# Patient Record
Sex: Male | Born: 1940 | Race: White | Hispanic: No | Smoking: Former smoker
Health system: Southern US, Community
[De-identification: ages and names within clinical notes are randomized; demographics above are authoritative.]

## PROBLEM LIST (undated history)

## (undated) DIAGNOSIS — R0600 Dyspnea, unspecified: Secondary | ICD-10-CM

## (undated) DIAGNOSIS — I350 Nonrheumatic aortic (valve) stenosis: Secondary | ICD-10-CM

## (undated) DIAGNOSIS — I10 Essential (primary) hypertension: Secondary | ICD-10-CM

## (undated) DIAGNOSIS — Z951 Presence of aortocoronary bypass graft: Secondary | ICD-10-CM

## (undated) DIAGNOSIS — D649 Anemia, unspecified: Secondary | ICD-10-CM

## (undated) DIAGNOSIS — I2510 Atherosclerotic heart disease of native coronary artery without angina pectoris: Secondary | ICD-10-CM

## (undated) DIAGNOSIS — E785 Hyperlipidemia, unspecified: Secondary | ICD-10-CM

## (undated) DIAGNOSIS — J189 Pneumonia, unspecified organism: Secondary | ICD-10-CM

## (undated) DIAGNOSIS — R911 Solitary pulmonary nodule: Secondary | ICD-10-CM

## (undated) DIAGNOSIS — Z952 Presence of prosthetic heart valve: Secondary | ICD-10-CM

## (undated) HISTORY — PX: WISDOM TOOTH EXTRACTION: SHX21

## (undated) HISTORY — PX: BONE MARROW ASPIRATION: SHX1252

## (undated) HISTORY — PX: COLONOSCOPY: SHX174

## (undated) HISTORY — DX: Essential (primary) hypertension: I10

## (undated) HISTORY — PX: CORONARY ARTERY BYPASS GRAFT: SHX141

## (undated) HISTORY — DX: Presence of aortocoronary bypass graft: Z95.1

## (undated) HISTORY — DX: Hyperlipidemia, unspecified: E78.5

## (undated) HISTORY — PX: EYE SURGERY: SHX253

## (undated) HISTORY — PX: CARDIAC CATHETERIZATION: SHX172

## (undated) HISTORY — PX: CARDIAC SURGERY: SHX584

## (undated) HISTORY — DX: Nonrheumatic aortic (valve) stenosis: I35.0

---

## 2009-09-04 HISTORY — PX: CORONARY ARTERY BYPASS GRAFT: SHX141

## 2014-12-01 DIAGNOSIS — K921 Melena: Secondary | ICD-10-CM | POA: Diagnosis not present

## 2014-12-23 DIAGNOSIS — R195 Other fecal abnormalities: Secondary | ICD-10-CM | POA: Diagnosis not present

## 2014-12-23 DIAGNOSIS — D649 Anemia, unspecified: Secondary | ICD-10-CM | POA: Diagnosis not present

## 2014-12-23 DIAGNOSIS — K219 Gastro-esophageal reflux disease without esophagitis: Secondary | ICD-10-CM | POA: Diagnosis not present

## 2014-12-30 DIAGNOSIS — D510 Vitamin B12 deficiency anemia due to intrinsic factor deficiency: Secondary | ICD-10-CM | POA: Diagnosis not present

## 2015-01-08 DIAGNOSIS — D510 Vitamin B12 deficiency anemia due to intrinsic factor deficiency: Secondary | ICD-10-CM | POA: Diagnosis not present

## 2015-01-14 DIAGNOSIS — D510 Vitamin B12 deficiency anemia due to intrinsic factor deficiency: Secondary | ICD-10-CM | POA: Diagnosis not present

## 2015-01-19 DIAGNOSIS — D649 Anemia, unspecified: Secondary | ICD-10-CM | POA: Diagnosis not present

## 2015-01-19 DIAGNOSIS — K449 Diaphragmatic hernia without obstruction or gangrene: Secondary | ICD-10-CM | POA: Diagnosis not present

## 2015-01-19 DIAGNOSIS — K222 Esophageal obstruction: Secondary | ICD-10-CM | POA: Diagnosis not present

## 2015-01-19 DIAGNOSIS — K529 Noninfective gastroenteritis and colitis, unspecified: Secondary | ICD-10-CM | POA: Diagnosis not present

## 2015-01-19 DIAGNOSIS — D122 Benign neoplasm of ascending colon: Secondary | ICD-10-CM | POA: Diagnosis not present

## 2015-01-19 DIAGNOSIS — K644 Residual hemorrhoidal skin tags: Secondary | ICD-10-CM | POA: Diagnosis not present

## 2015-01-19 DIAGNOSIS — K210 Gastro-esophageal reflux disease with esophagitis: Secondary | ICD-10-CM | POA: Diagnosis not present

## 2015-01-19 DIAGNOSIS — D126 Benign neoplasm of colon, unspecified: Secondary | ICD-10-CM | POA: Diagnosis not present

## 2015-01-19 DIAGNOSIS — Z1211 Encounter for screening for malignant neoplasm of colon: Secondary | ICD-10-CM | POA: Diagnosis not present

## 2015-01-19 DIAGNOSIS — Z87891 Personal history of nicotine dependence: Secondary | ICD-10-CM | POA: Diagnosis not present

## 2015-01-19 DIAGNOSIS — K5289 Other specified noninfective gastroenteritis and colitis: Secondary | ICD-10-CM | POA: Diagnosis not present

## 2015-01-19 DIAGNOSIS — Q2733 Arteriovenous malformation of digestive system vessel: Secondary | ICD-10-CM | POA: Diagnosis not present

## 2015-01-19 DIAGNOSIS — D120 Benign neoplasm of cecum: Secondary | ICD-10-CM | POA: Diagnosis not present

## 2015-01-19 DIAGNOSIS — I10 Essential (primary) hypertension: Secondary | ICD-10-CM | POA: Diagnosis not present

## 2015-01-21 DIAGNOSIS — D510 Vitamin B12 deficiency anemia due to intrinsic factor deficiency: Secondary | ICD-10-CM | POA: Diagnosis not present

## 2015-02-18 DIAGNOSIS — D510 Vitamin B12 deficiency anemia due to intrinsic factor deficiency: Secondary | ICD-10-CM | POA: Diagnosis not present

## 2015-02-22 DIAGNOSIS — D126 Benign neoplasm of colon, unspecified: Secondary | ICD-10-CM | POA: Diagnosis not present

## 2015-03-19 DIAGNOSIS — D510 Vitamin B12 deficiency anemia due to intrinsic factor deficiency: Secondary | ICD-10-CM | POA: Diagnosis not present

## 2015-04-22 DIAGNOSIS — D510 Vitamin B12 deficiency anemia due to intrinsic factor deficiency: Secondary | ICD-10-CM | POA: Diagnosis not present

## 2015-05-20 DIAGNOSIS — D510 Vitamin B12 deficiency anemia due to intrinsic factor deficiency: Secondary | ICD-10-CM | POA: Diagnosis not present

## 2015-05-27 DIAGNOSIS — E780 Pure hypercholesterolemia: Secondary | ICD-10-CM | POA: Diagnosis not present

## 2015-05-27 DIAGNOSIS — Z0000 Encounter for general adult medical examination without abnormal findings: Secondary | ICD-10-CM | POA: Diagnosis not present

## 2015-05-27 DIAGNOSIS — D510 Vitamin B12 deficiency anemia due to intrinsic factor deficiency: Secondary | ICD-10-CM | POA: Diagnosis not present

## 2015-05-27 DIAGNOSIS — I10 Essential (primary) hypertension: Secondary | ICD-10-CM | POA: Diagnosis not present

## 2015-06-18 DIAGNOSIS — D510 Vitamin B12 deficiency anemia due to intrinsic factor deficiency: Secondary | ICD-10-CM | POA: Diagnosis not present

## 2015-07-22 DIAGNOSIS — D510 Vitamin B12 deficiency anemia due to intrinsic factor deficiency: Secondary | ICD-10-CM | POA: Diagnosis not present

## 2015-08-19 DIAGNOSIS — D510 Vitamin B12 deficiency anemia due to intrinsic factor deficiency: Secondary | ICD-10-CM | POA: Diagnosis not present

## 2015-09-24 DIAGNOSIS — D510 Vitamin B12 deficiency anemia due to intrinsic factor deficiency: Secondary | ICD-10-CM | POA: Diagnosis not present

## 2015-10-21 DIAGNOSIS — D510 Vitamin B12 deficiency anemia due to intrinsic factor deficiency: Secondary | ICD-10-CM | POA: Diagnosis not present

## 2015-11-19 DIAGNOSIS — D510 Vitamin B12 deficiency anemia due to intrinsic factor deficiency: Secondary | ICD-10-CM | POA: Diagnosis not present

## 2015-12-20 DIAGNOSIS — D510 Vitamin B12 deficiency anemia due to intrinsic factor deficiency: Secondary | ICD-10-CM | POA: Diagnosis not present

## 2016-01-18 DIAGNOSIS — D510 Vitamin B12 deficiency anemia due to intrinsic factor deficiency: Secondary | ICD-10-CM | POA: Diagnosis not present

## 2016-01-21 DIAGNOSIS — H2513 Age-related nuclear cataract, bilateral: Secondary | ICD-10-CM | POA: Diagnosis not present

## 2016-01-21 DIAGNOSIS — H52223 Regular astigmatism, bilateral: Secondary | ICD-10-CM | POA: Diagnosis not present

## 2016-02-17 DIAGNOSIS — D510 Vitamin B12 deficiency anemia due to intrinsic factor deficiency: Secondary | ICD-10-CM | POA: Diagnosis not present

## 2016-03-17 DIAGNOSIS — E538 Deficiency of other specified B group vitamins: Secondary | ICD-10-CM | POA: Diagnosis not present

## 2016-04-19 DIAGNOSIS — E538 Deficiency of other specified B group vitamins: Secondary | ICD-10-CM | POA: Diagnosis not present

## 2016-05-09 DIAGNOSIS — H2512 Age-related nuclear cataract, left eye: Secondary | ICD-10-CM | POA: Diagnosis not present

## 2016-05-09 DIAGNOSIS — H18411 Arcus senilis, right eye: Secondary | ICD-10-CM | POA: Diagnosis not present

## 2016-05-09 DIAGNOSIS — H02839 Dermatochalasis of unspecified eye, unspecified eyelid: Secondary | ICD-10-CM | POA: Diagnosis not present

## 2016-05-09 DIAGNOSIS — H2511 Age-related nuclear cataract, right eye: Secondary | ICD-10-CM | POA: Diagnosis not present

## 2016-05-15 DIAGNOSIS — H2512 Age-related nuclear cataract, left eye: Secondary | ICD-10-CM | POA: Diagnosis not present

## 2016-05-15 DIAGNOSIS — H2511 Age-related nuclear cataract, right eye: Secondary | ICD-10-CM | POA: Diagnosis not present

## 2016-05-16 DIAGNOSIS — H2511 Age-related nuclear cataract, right eye: Secondary | ICD-10-CM | POA: Diagnosis not present

## 2016-05-18 DIAGNOSIS — H2513 Age-related nuclear cataract, bilateral: Secondary | ICD-10-CM | POA: Diagnosis not present

## 2016-05-19 DIAGNOSIS — E538 Deficiency of other specified B group vitamins: Secondary | ICD-10-CM | POA: Diagnosis not present

## 2016-05-29 DIAGNOSIS — H2512 Age-related nuclear cataract, left eye: Secondary | ICD-10-CM | POA: Diagnosis not present

## 2016-05-29 DIAGNOSIS — H2513 Age-related nuclear cataract, bilateral: Secondary | ICD-10-CM | POA: Diagnosis not present

## 2016-06-16 DIAGNOSIS — E538 Deficiency of other specified B group vitamins: Secondary | ICD-10-CM | POA: Diagnosis not present

## 2016-06-16 DIAGNOSIS — Z23 Encounter for immunization: Secondary | ICD-10-CM | POA: Diagnosis not present

## 2016-07-18 DIAGNOSIS — E538 Deficiency of other specified B group vitamins: Secondary | ICD-10-CM | POA: Diagnosis not present

## 2016-07-26 DIAGNOSIS — J40 Bronchitis, not specified as acute or chronic: Secondary | ICD-10-CM | POA: Diagnosis not present

## 2016-07-26 DIAGNOSIS — E785 Hyperlipidemia, unspecified: Secondary | ICD-10-CM | POA: Diagnosis not present

## 2016-07-26 DIAGNOSIS — I10 Essential (primary) hypertension: Secondary | ICD-10-CM | POA: Diagnosis not present

## 2016-07-26 DIAGNOSIS — Z0000 Encounter for general adult medical examination without abnormal findings: Secondary | ICD-10-CM | POA: Diagnosis not present

## 2016-07-26 DIAGNOSIS — Z79899 Other long term (current) drug therapy: Secondary | ICD-10-CM | POA: Diagnosis not present

## 2016-08-15 DIAGNOSIS — K921 Melena: Secondary | ICD-10-CM | POA: Diagnosis not present

## 2016-08-18 DIAGNOSIS — E538 Deficiency of other specified B group vitamins: Secondary | ICD-10-CM | POA: Diagnosis not present

## 2016-08-30 DIAGNOSIS — D649 Anemia, unspecified: Secondary | ICD-10-CM | POA: Diagnosis not present

## 2016-08-30 DIAGNOSIS — D126 Benign neoplasm of colon, unspecified: Secondary | ICD-10-CM | POA: Diagnosis not present

## 2016-08-30 DIAGNOSIS — D510 Vitamin B12 deficiency anemia due to intrinsic factor deficiency: Secondary | ICD-10-CM | POA: Diagnosis not present

## 2016-09-12 DIAGNOSIS — Q2733 Arteriovenous malformation of digestive system vessel: Secondary | ICD-10-CM | POA: Diagnosis not present

## 2016-09-12 DIAGNOSIS — K449 Diaphragmatic hernia without obstruction or gangrene: Secondary | ICD-10-CM | POA: Diagnosis not present

## 2016-09-12 DIAGNOSIS — K2970 Gastritis, unspecified, without bleeding: Secondary | ICD-10-CM | POA: Diagnosis not present

## 2016-09-12 DIAGNOSIS — D126 Benign neoplasm of colon, unspecified: Secondary | ICD-10-CM | POA: Diagnosis not present

## 2016-09-12 DIAGNOSIS — I252 Old myocardial infarction: Secondary | ICD-10-CM | POA: Diagnosis not present

## 2016-09-12 DIAGNOSIS — D509 Iron deficiency anemia, unspecified: Secondary | ICD-10-CM | POA: Diagnosis not present

## 2016-09-12 DIAGNOSIS — Z79899 Other long term (current) drug therapy: Secondary | ICD-10-CM | POA: Diagnosis not present

## 2016-09-12 DIAGNOSIS — I10 Essential (primary) hypertension: Secondary | ICD-10-CM | POA: Diagnosis not present

## 2016-09-12 DIAGNOSIS — K644 Residual hemorrhoidal skin tags: Secondary | ICD-10-CM | POA: Diagnosis not present

## 2016-09-12 DIAGNOSIS — K219 Gastro-esophageal reflux disease without esophagitis: Secondary | ICD-10-CM | POA: Diagnosis not present

## 2016-09-12 DIAGNOSIS — Z7982 Long term (current) use of aspirin: Secondary | ICD-10-CM | POA: Diagnosis not present

## 2016-09-12 DIAGNOSIS — K209 Esophagitis, unspecified: Secondary | ICD-10-CM | POA: Diagnosis not present

## 2016-09-12 DIAGNOSIS — K222 Esophageal obstruction: Secondary | ICD-10-CM | POA: Diagnosis not present

## 2016-09-12 DIAGNOSIS — Z951 Presence of aortocoronary bypass graft: Secondary | ICD-10-CM | POA: Diagnosis not present

## 2016-09-12 DIAGNOSIS — K5730 Diverticulosis of large intestine without perforation or abscess without bleeding: Secondary | ICD-10-CM | POA: Diagnosis not present

## 2016-09-12 DIAGNOSIS — D124 Benign neoplasm of descending colon: Secondary | ICD-10-CM | POA: Diagnosis not present

## 2016-09-18 DIAGNOSIS — E538 Deficiency of other specified B group vitamins: Secondary | ICD-10-CM | POA: Diagnosis not present

## 2016-10-19 DIAGNOSIS — E538 Deficiency of other specified B group vitamins: Secondary | ICD-10-CM | POA: Diagnosis not present

## 2016-11-01 DIAGNOSIS — D649 Anemia, unspecified: Secondary | ICD-10-CM | POA: Diagnosis not present

## 2016-11-13 DIAGNOSIS — K219 Gastro-esophageal reflux disease without esophagitis: Secondary | ICD-10-CM | POA: Diagnosis not present

## 2016-11-14 DIAGNOSIS — E538 Deficiency of other specified B group vitamins: Secondary | ICD-10-CM | POA: Diagnosis not present

## 2016-12-19 DIAGNOSIS — D510 Vitamin B12 deficiency anemia due to intrinsic factor deficiency: Secondary | ICD-10-CM | POA: Diagnosis not present

## 2017-01-16 DIAGNOSIS — D510 Vitamin B12 deficiency anemia due to intrinsic factor deficiency: Secondary | ICD-10-CM | POA: Diagnosis not present

## 2017-01-23 DIAGNOSIS — H04123 Dry eye syndrome of bilateral lacrimal glands: Secondary | ICD-10-CM | POA: Diagnosis not present

## 2017-01-23 DIAGNOSIS — H5203 Hypermetropia, bilateral: Secondary | ICD-10-CM | POA: Diagnosis not present

## 2017-01-23 DIAGNOSIS — H26493 Other secondary cataract, bilateral: Secondary | ICD-10-CM | POA: Diagnosis not present

## 2017-01-30 DIAGNOSIS — D649 Anemia, unspecified: Secondary | ICD-10-CM | POA: Diagnosis not present

## 2017-02-05 DIAGNOSIS — K219 Gastro-esophageal reflux disease without esophagitis: Secondary | ICD-10-CM | POA: Diagnosis not present

## 2017-02-16 DIAGNOSIS — D510 Vitamin B12 deficiency anemia due to intrinsic factor deficiency: Secondary | ICD-10-CM | POA: Diagnosis not present

## 2017-02-28 DIAGNOSIS — D649 Anemia, unspecified: Secondary | ICD-10-CM | POA: Diagnosis not present

## 2017-03-19 DIAGNOSIS — D510 Vitamin B12 deficiency anemia due to intrinsic factor deficiency: Secondary | ICD-10-CM | POA: Diagnosis not present

## 2017-03-30 DIAGNOSIS — D649 Anemia, unspecified: Secondary | ICD-10-CM | POA: Diagnosis not present

## 2017-04-17 DIAGNOSIS — D510 Vitamin B12 deficiency anemia due to intrinsic factor deficiency: Secondary | ICD-10-CM | POA: Diagnosis not present

## 2017-05-11 DIAGNOSIS — D649 Anemia, unspecified: Secondary | ICD-10-CM | POA: Diagnosis not present

## 2017-05-18 DIAGNOSIS — D510 Vitamin B12 deficiency anemia due to intrinsic factor deficiency: Secondary | ICD-10-CM | POA: Diagnosis not present

## 2017-06-18 DIAGNOSIS — Z23 Encounter for immunization: Secondary | ICD-10-CM | POA: Diagnosis not present

## 2017-07-19 DIAGNOSIS — D510 Vitamin B12 deficiency anemia due to intrinsic factor deficiency: Secondary | ICD-10-CM | POA: Diagnosis not present

## 2017-08-17 DIAGNOSIS — D510 Vitamin B12 deficiency anemia due to intrinsic factor deficiency: Secondary | ICD-10-CM | POA: Diagnosis not present

## 2017-09-10 DIAGNOSIS — D649 Anemia, unspecified: Secondary | ICD-10-CM | POA: Diagnosis not present

## 2017-09-12 DIAGNOSIS — Z79899 Other long term (current) drug therapy: Secondary | ICD-10-CM | POA: Diagnosis not present

## 2017-09-12 DIAGNOSIS — I10 Essential (primary) hypertension: Secondary | ICD-10-CM | POA: Diagnosis not present

## 2017-09-12 DIAGNOSIS — Z0000 Encounter for general adult medical examination without abnormal findings: Secondary | ICD-10-CM | POA: Diagnosis not present

## 2017-09-12 DIAGNOSIS — Z1331 Encounter for screening for depression: Secondary | ICD-10-CM | POA: Diagnosis not present

## 2017-09-12 DIAGNOSIS — Z125 Encounter for screening for malignant neoplasm of prostate: Secondary | ICD-10-CM | POA: Diagnosis not present

## 2017-09-12 DIAGNOSIS — E785 Hyperlipidemia, unspecified: Secondary | ICD-10-CM | POA: Diagnosis not present

## 2017-09-19 DIAGNOSIS — D510 Vitamin B12 deficiency anemia due to intrinsic factor deficiency: Secondary | ICD-10-CM | POA: Diagnosis not present

## 2017-10-19 DIAGNOSIS — D510 Vitamin B12 deficiency anemia due to intrinsic factor deficiency: Secondary | ICD-10-CM | POA: Diagnosis not present

## 2017-11-16 DIAGNOSIS — D510 Vitamin B12 deficiency anemia due to intrinsic factor deficiency: Secondary | ICD-10-CM | POA: Diagnosis not present

## 2017-12-17 DIAGNOSIS — D510 Vitamin B12 deficiency anemia due to intrinsic factor deficiency: Secondary | ICD-10-CM | POA: Diagnosis not present

## 2018-01-16 DIAGNOSIS — D510 Vitamin B12 deficiency anemia due to intrinsic factor deficiency: Secondary | ICD-10-CM | POA: Diagnosis not present

## 2018-02-15 DIAGNOSIS — D510 Vitamin B12 deficiency anemia due to intrinsic factor deficiency: Secondary | ICD-10-CM | POA: Diagnosis not present

## 2018-03-11 DIAGNOSIS — D649 Anemia, unspecified: Secondary | ICD-10-CM | POA: Diagnosis not present

## 2018-03-11 DIAGNOSIS — E538 Deficiency of other specified B group vitamins: Secondary | ICD-10-CM | POA: Diagnosis not present

## 2018-03-18 DIAGNOSIS — D510 Vitamin B12 deficiency anemia due to intrinsic factor deficiency: Secondary | ICD-10-CM | POA: Diagnosis not present

## 2018-03-25 ENCOUNTER — Ambulatory Visit: Payer: Medicare Other | Admitting: Cardiology

## 2018-03-25 ENCOUNTER — Encounter: Payer: Self-pay | Admitting: Cardiology

## 2018-03-25 VITALS — BP 138/70 | HR 57 | Ht 71.0 in | Wt 198.0 lb

## 2018-03-25 DIAGNOSIS — R011 Cardiac murmur, unspecified: Secondary | ICD-10-CM

## 2018-03-25 DIAGNOSIS — I10 Essential (primary) hypertension: Secondary | ICD-10-CM | POA: Diagnosis not present

## 2018-03-25 DIAGNOSIS — I2510 Atherosclerotic heart disease of native coronary artery without angina pectoris: Secondary | ICD-10-CM | POA: Diagnosis not present

## 2018-03-25 DIAGNOSIS — E782 Mixed hyperlipidemia: Secondary | ICD-10-CM | POA: Diagnosis not present

## 2018-03-25 DIAGNOSIS — R0609 Other forms of dyspnea: Secondary | ICD-10-CM

## 2018-03-25 DIAGNOSIS — Z951 Presence of aortocoronary bypass graft: Secondary | ICD-10-CM | POA: Diagnosis not present

## 2018-03-25 HISTORY — DX: Mixed hyperlipidemia: E78.2

## 2018-03-25 HISTORY — DX: Presence of aortocoronary bypass graft: Z95.1

## 2018-03-25 HISTORY — DX: Essential (primary) hypertension: I10

## 2018-03-25 HISTORY — DX: Atherosclerotic heart disease of native coronary artery without angina pectoris: I25.10

## 2018-03-25 MED ORDER — NITROGLYCERIN 0.4 MG SL SUBL: 0.4000 mg | SUBLINGUAL_TABLET | SUBLINGUAL | 11 refills | Status: DC | PRN

## 2018-03-25 NOTE — Progress Notes (Signed)
Cardiology Office Note:    Date:  03/25/2018   ID:  Christopher Rocha, DOB 10/15/1940, MRN [id number]  PCP:  [name], MD  Cardiologist:  Jenean Lindau, MD   Referring MD: No ref. provider found    ASSESSMENT:    1. DOE (dyspnea on exertion)   2. Essential hypertension   3. Mixed dyslipidemia   4. Coronary artery disease involving native coronary artery of native heart without angina pectoris   5. Murmur   6. Hx of CABG    PLAN:    In order of problems listed above:  1. Secondary prevention stressed with the patient.  Importance of compliance with diet and medications stressed and he vocalized understanding.  His blood pressure is stable.  Diet was discussed for dyslipidemia and hypertension. 2. Patient has a significant murmur on auscultation we will have an echocardiogram to assess this. 3. He does complain of shortness of breath on exertion and therefore will do a Lexiscan sestamibi once we evaluate his murmur by echocardiogram. 4. Sublingual nitroglycerin prescription was sent, its protocol and 911 protocol explained and the patient vocalized understanding questions were answered to the patient's satisfaction 5. Patient will be seen in follow-up appointment in 6 months or earlier if the patient has any concerns    Medication Adjustments/Labs and Tests Ordered: Current medicines are reviewed at length with the patient today.  Concerns regarding medicines are outlined above.  Orders Placed This Encounter  Procedures  . MYOCARDIAL PERFUSION IMAGING  . EKG 12-Lead  . ECHOCARDIOGRAM COMPLETE   Meds ordered this encounter  Medications  . nitroGLYCERIN (NITROSTAT) 0.4 MG SL tablet    Sig: Place 1 tablet (0.4 mg total) under the tongue every 5 (five) minutes as needed.    Dispense:  25 tablet    Refill:  11     History of Present Illness:    Christopher Rocha is a 77 y.o. male who is being seen today for the evaluation of coronary artery disease and CABG  surgery.  Patient is a pleasant 77 year old male.  He is accompanied by his wife.  He mentions to me that he was established with my partner in my previous practice and wants to get established here.  He has known coronary artery disease post CABG surgery.  He has history of hypertension and dyslipidemia.  He denies any problems at this time and takes care of activities of daily living.  No chest pain orthopnea or PND.  At the time of my evaluation, the patient is alert awake oriented and in no distress.  History reviewed. No pertinent past medical history.  Past Surgical History:  Procedure Laterality Date  . CARDIAC SURGERY      Current Medications: Current Meds  Medication Sig  . aspirin EC 81 MG tablet Take 81 mg by mouth daily.  Marland Kitchen atorvastatin (LIPITOR) 80 MG tablet Take 1 tablet by mouth daily.  Marland Kitchen losartan-hydrochlorothiazide (HYZAAR) 100-12.5 MG tablet Take 1 tablet by mouth daily.     Allergies:   Penicillins   Social History   Socioeconomic History  . Marital status: Married    Spouse name: Not on file  . Number of children: Not on file  . Years of education: Not on file  . Highest education level: Not on file  Occupational History  . Not on file  Social Needs  . Financial resource strain: Not on file  . Food insecurity:    Worry: Not on file  Inability: Not on file  . Transportation needs:    Medical: Not on file    Non-medical: Not on file  Tobacco Use  . Smoking status: Former Research scientist (life sciences)  . Smokeless tobacco: Never Used  Substance and Sexual Activity  . Alcohol use: Not on file  . Drug use: Not on file  . Sexual activity: Not on file  Lifestyle  . Physical activity:    Days per week: Not on file    Minutes per session: Not on file  . Stress: Not on file  Relationships  . Social connections:    Talks on phone: Not on file    Gets together: Not on file    Attends religious service: Not on file    Active member of club or organization: Not on file     Attends meetings of clubs or organizations: Not on file    Relationship status: Not on file  Other Topics Concern  . Not on file  Social History Narrative  . Not on file     Family History: The patient's family history includes Clotting disorder in his father; Diabetes in his mother.  ROS:   Please see the history of present illness.    All other systems reviewed and are negative.  EKGs/Labs/Other Studies Reviewed:    The following studies were reviewed today: EKG reveals sinus rhythm and nonspecific ST-T changes.  Left ventricular hypertrophy.   Recent Labs: No results found for requested labs within last 8760 hours.  Recent Lipid Panel No results found for: CHOL, TRIG, HDL, CHOLHDL, VLDL, LDLCALC, LDLDIRECT  Physical Exam:    VS:  BP 138/70 (BP Location: Right Arm, Patient Position: Sitting, Cuff Size: Normal)   Pulse (!) 57   Ht 5\' 11"  (1.803 m)   Wt 198 lb (89.8 kg)   SpO2 98%   BMI 27.62 kg/m     Wt Readings from Last 3 Encounters:  03/25/18 198 lb (89.8 kg)     GEN: Patient is in no acute distress HEENT: Normal NECK: No JVD; No carotid bruits LYMPHATICS: No lymphadenopathy CARDIAC: S1 S2 regular, 2/6 systolic murmur at the apex. RESPIRATORY:  Clear to auscultation without rales, wheezing or rhonchi  ABDOMEN: Soft, non-tender, non-distended MUSCULOSKELETAL:  No edema; No deformity  SKIN: Warm and dry NEUROLOGIC:  Alert and oriented x 3 PSYCHIATRIC:  Normal affect    Signed, Jenean Lindau, MD  03/25/2018 10:37 AM    [place]

## 2018-03-25 NOTE — Patient Instructions (Addendum)
Medication Instructions:  Your physician has recommended you make the following change in your medication:  START Nitroglycerin 0.4 mg sublingual (under your tongue) as needed for chest pain. If experiencing chest pain, stop what you are doing and sit down. Take 1 nitroglycerin and wait 5 minutes. If chest pain continues, take another nitroglycerin and wait 5 minutes. If chest pain does not subside, take 1 more nitroglycerin and dial 911. You make take a total of 3 nitroglycerin in a 15 minute time frame.  Labwork: None  Testing/Procedures: Your physician has requested that you have an echocardiogram. Echocardiography is a painless test that uses sound waves to create images of your heart. It provides your doctor with information about the size and shape of your heart and how well your heart's chambers and valves are working. This procedure takes approximately one hour. There are no restrictions for this procedure.  Your physician has requested that you have a lexiscan myoview. For further information please visit [email or web address]. Please follow instruction sheet, as given.  [facility] [address], [place], [place] [address] [phone]  Lexiscan testing instructions:  Please present to [facility] 15 minutes earlier than your appointment time to allow for registration.  You will be called with an appointment date and time by our office once it has been scheduled; please allow at least 48 hours for [place] to contact you.  No food or drink after midnight prior to your test (except for small sips of water with your medications).  Hold the following medications the morning of your test: losartan-hctz (if causes increased urination)  Bring a medication list or all your medications with you the morning of the testing.  No caffeine, decaffeinated or chocolate products 12 hours prior to the testing.  Please be aware that the test can take up to 3-4 hours.  Should  you have any problem with the appointment date or time, please call [phone].   Please call the office with any further questions or concerns.   Follow-Up:Your physician recommends that you schedule a follow-up appointment in: 6 months   Any Other Special Instructions Will Be Listed Below (If Applicable).     If you need a refill on your cardiac medications before your next appointment, please call your pharmacy.

## 2018-04-15 DIAGNOSIS — H5203 Hypermetropia, bilateral: Secondary | ICD-10-CM | POA: Diagnosis not present

## 2018-04-15 DIAGNOSIS — H26493 Other secondary cataract, bilateral: Secondary | ICD-10-CM | POA: Diagnosis not present

## 2018-04-18 DIAGNOSIS — D510 Vitamin B12 deficiency anemia due to intrinsic factor deficiency: Secondary | ICD-10-CM | POA: Diagnosis not present

## 2018-04-29 ENCOUNTER — Other Ambulatory Visit: Payer: Self-pay

## 2018-04-29 ENCOUNTER — Ambulatory Visit (INDEPENDENT_AMBULATORY_CARE_PROVIDER_SITE_OTHER): Payer: Medicare Other

## 2018-04-29 DIAGNOSIS — R011 Cardiac murmur, unspecified: Secondary | ICD-10-CM | POA: Diagnosis not present

## 2018-04-29 DIAGNOSIS — I2510 Atherosclerotic heart disease of native coronary artery without angina pectoris: Secondary | ICD-10-CM

## 2018-04-29 DIAGNOSIS — R0609 Other forms of dyspnea: Secondary | ICD-10-CM | POA: Diagnosis not present

## 2018-04-29 MED ORDER — PERFLUTREN LIPID MICROSPHERE: 1.0000 mL | INTRAVENOUS | Status: AC | PRN

## 2018-04-29 NOTE — Progress Notes (Addendum)
Complete echocardiogram with contrast has been performed.  New Christopher Rocha

## 2018-05-02 ENCOUNTER — Ambulatory Visit (INDEPENDENT_AMBULATORY_CARE_PROVIDER_SITE_OTHER): Payer: Medicare Other | Admitting: Cardiology

## 2018-05-02 ENCOUNTER — Encounter: Payer: Self-pay | Admitting: Cardiology

## 2018-05-02 VITALS — BP 130/64 | HR 60 | Ht 71.0 in | Wt 198.0 lb

## 2018-05-02 DIAGNOSIS — I350 Nonrheumatic aortic (valve) stenosis: Secondary | ICD-10-CM

## 2018-05-02 DIAGNOSIS — I2510 Atherosclerotic heart disease of native coronary artery without angina pectoris: Secondary | ICD-10-CM | POA: Diagnosis not present

## 2018-05-02 DIAGNOSIS — J984 Other disorders of lung: Secondary | ICD-10-CM | POA: Diagnosis not present

## 2018-05-02 DIAGNOSIS — E782 Mixed hyperlipidemia: Secondary | ICD-10-CM | POA: Diagnosis not present

## 2018-05-02 DIAGNOSIS — R918 Other nonspecific abnormal finding of lung field: Secondary | ICD-10-CM | POA: Diagnosis not present

## 2018-05-02 DIAGNOSIS — Z951 Presence of aortocoronary bypass graft: Secondary | ICD-10-CM

## 2018-05-02 DIAGNOSIS — Z01818 Encounter for other preprocedural examination: Secondary | ICD-10-CM

## 2018-05-02 DIAGNOSIS — I10 Essential (primary) hypertension: Secondary | ICD-10-CM | POA: Diagnosis not present

## 2018-05-02 LAB — BASIC METABOLIC PANEL
BUN / CREAT RATIO: 26 — AB (ref 10–24)
BUN: 31 mg/dL — ABNORMAL HIGH (ref 8–27)
CHLORIDE: 100 mmol/L (ref 96–106)
CO2: 24 mmol/L (ref 20–29)
Calcium: 9.6 mg/dL (ref 8.6–10.2)
Creatinine, Ser: 1.2 mg/dL (ref 0.76–1.27)
GFR calc non Af Amer: 58 mL/min/{1.73_m2} — ABNORMAL LOW (ref >59)
GFR, EST AFRICAN AMERICAN: 67 mL/min/{1.73_m2} (ref >59)
GLUCOSE: 203 mg/dL — AB (ref 65–99)
POTASSIUM: 4.7 mmol/L (ref 3.5–5.2)
Sodium: 138 mmol/L (ref 134–144)

## 2018-05-02 LAB — CBC WITH DIFFERENTIAL/PLATELET
BASOS ABS: 0 10*3/uL (ref 0.0–0.2)
Basos: 0 %
EOS (ABSOLUTE): 0.4 10*3/uL (ref 0.0–0.4)
Eos: 6 %
Hematocrit: 29.5 % — ABNORMAL LOW (ref 37.5–51.0)
Hemoglobin: 10.1 g/dL — ABNORMAL LOW (ref 13.0–17.7)
Immature Grans (Abs): 0.1 10*3/uL (ref 0.0–0.1)
Immature Granulocytes: 1 %
LYMPHS ABS: 1.4 10*3/uL (ref 0.7–3.1)
Lymphs: 17 %
MCH: 31.4 pg (ref 26.6–33.0)
MCHC: 34.2 g/dL (ref 31.5–35.7)
MCV: 92 fL (ref 79–97)
MONOS ABS: 0.8 10*3/uL (ref 0.1–0.9)
Monocytes: 11 %
NEUTROS ABS: 5.1 10*3/uL (ref 1.4–7.0)
Neutrophils: 65 %
PLATELETS: 266 10*3/uL (ref 150–450)
RBC: 3.22 x10E6/uL — ABNORMAL LOW (ref 4.14–5.80)
RDW: 13.8 % (ref 12.3–15.4)
WBC: 7.8 10*3/uL (ref 3.4–10.8)

## 2018-05-02 NOTE — Progress Notes (Signed)
Cardiology Office Note:    Date:  05/02/2018   ID:  Christopher Rocha, DOB [birth year], MRN [id number]  PCP:  [name], MD  Cardiologist:  Jenean Lindau, MD   Referring MD: [name], MD    ASSESSMENT:    1. Severe aortic stenosis   2. Coronary artery disease involving native coronary artery of native heart without angina pectoris   3. Essential hypertension   4. Hx of CABG   5. Mixed dyslipidemia    PLAN:    In order of problems listed above:  1. In view of the above findings I discussed coronary angiography and left heart catheterization.  He is agreeable.I discussed coronary angiography and left heart catheterization with the patient at extensive length. Procedure, benefits and potential risks were explained. Patient had multiple questions which were answered to the patient's satisfaction. Patient agreed and consented for the procedure. Further recommendations will be made based on the findings of the coronary angiography. In the interim. The patient has any significant symptoms he knows to go to the nearest emergency room. 2. Secondary prevention stressed to the patient.  Importance of compliance with diet and medication stressed and he vocalized understanding  Medication Adjustments/Labs and Tests Ordered: Current medicines are reviewed at length with the patient today.  Concerns regarding medicines are outlined above.  No orders of the defined types were placed in this encounter.  No orders of the defined types were placed in this encounter.    No chief complaint on file.    History of Present Illness:    Christopher Rocha is a 77 y.o. male.  The patient has coronary artery disease post CABG surgery.  He leads a sedentary lifestyle.  He is in overall good health.  Echocardiogram revealed severe aortic stenosis.  He does have shortness of breath on exertion.  At the time of my evaluation, the patient is alert awake oriented and in no distress.  History  reviewed. No pertinent past medical history.  Past Surgical History:  Procedure Laterality Date  . CARDIAC SURGERY      Current Medications: Current Meds  Medication Sig  . aspirin EC 81 MG tablet Take 81 mg by mouth daily.  Marland Kitchen atorvastatin (LIPITOR) 80 MG tablet Take 1 tablet by mouth daily.  Marland Kitchen losartan-hydrochlorothiazide (HYZAAR) 100-12.5 MG tablet Take 1 tablet by mouth daily.  . nitroGLYCERIN (NITROSTAT) 0.4 MG SL tablet Place 1 tablet (0.4 mg total) under the tongue every 5 (five) minutes as needed.     Allergies:   Penicillins   Social History   Socioeconomic History  . Marital status: Married    Spouse name: Not on file  . Number of children: Not on file  . Years of education: Not on file  . Highest education level: Not on file  Occupational History  . Not on file  Social Needs  . Financial resource strain: Not on file  . Food insecurity:    Worry: Not on file    Inability: Not on file  . Transportation needs:    Medical: Not on file    Non-medical: Not on file  Tobacco Use  . Smoking status: Former Research scientist (life sciences)  . Smokeless tobacco: Never Used  Substance and Sexual Activity  . Alcohol use: Not on file  . Drug use: Not on file  . Sexual activity: Not on file  Lifestyle  . Physical activity:    Days per week: Not on file    Minutes per session:  Not on file  . Stress: Not on file  Relationships  . Social connections:    Talks on phone: Not on file    Gets together: Not on file    Attends religious service: Not on file    Active member of club or organization: Not on file    Attends meetings of clubs or organizations: Not on file    Relationship status: Not on file  Other Topics Concern  . Not on file  Social History Narrative  . Not on file     Family History: The patient's family history includes Clotting disorder in his father; Diabetes in his mother.  ROS:   Please see the history of present illness.    All other systems reviewed and are  negative.  EKGs/Labs/Other Studies Reviewed:    The following studies were reviewed today: I discussed my findings with the patient at extensive length.   Recent Labs: No results found for requested labs within last 8760 hours.  Recent Lipid Panel No results found for: CHOL, TRIG, HDL, CHOLHDL, VLDL, LDLCALC, LDLDIRECT  Physical Exam:    VS:  BP 130/64 (BP Location: Right Arm, Patient Position: Sitting, Cuff Size: Normal)   Pulse 60   Ht 5\' 11"  (1.803 m)   Wt 198 lb (89.8 kg)   SpO2 95%   BMI 27.62 kg/m     Wt Readings from Last 3 Encounters:  05/02/18 198 lb (89.8 kg)  03/25/18 198 lb (89.8 kg)     GEN: Patient is in no acute distress HEENT: Normal NECK: No JVD; No carotid bruits LYMPHATICS: No lymphadenopathy CARDIAC: Hear sounds regular, 2/6 systolic murmur at the apex. RESPIRATORY:  Clear to auscultation without rales, wheezing or rhonchi  ABDOMEN: Soft, non-tender, non-distended MUSCULOSKELETAL:  No edema; No deformity  SKIN: Warm and dry NEUROLOGIC:  Alert and oriented x 3 PSYCHIATRIC:  Normal affect   Signed, Jenean Lindau, MD  05/02/2018 11:19 AM    [place]

## 2018-05-02 NOTE — Patient Instructions (Addendum)
Medication Instructions:  Your physician recommends that you continue on your current medications as directed. Please refer to the Current Medication list given to you today.  Labwork: Your physician recommends that you have the following labs drawn: CBC and BMP  Testing/Procedures: A chest x-ray takes a picture of the organs and structures inside the chest, including the heart, lungs, and blood vessels. This test can show several things, including, whether the heart is enlarges; whether fluid is building up in the lungs; and whether pacemaker / defibrillator leads are still in place.   [place] [id number] Dept: [phone] Loc: [place]  05/02/2018  You are scheduled for a Cardiac Catheterization on Wednesday, September 4 with Dr. Shelva Majestic.  1. Please arrive at the [facility] (Main Entrance A) at [facility]: [address] [place], [place] [address] at 11:00 AM (This time is two hours before your procedure to ensure your preparation). Free valet parking service is available.   Special note: Every effort is made to have your procedure done on time. Please understand that emergencies sometimes delay scheduled procedures.  2. Diet: Do not eat solid foods after midnight.  The patient may have clear liquids until 5am upon the day of the procedure.  3. Labs: Done today.  4. Medication instructions in preparation for your procedure:  Stop taking, losartan-HCTZ on Wednesday, September 4.  On the morning of your procedure, take your Aspirin and any morning medicines NOT listed above.  You may use sips of water.  5. Plan for one night stay--bring personal belongings. 6. Bring a current list of your medications and current insurance cards. 7. You MUST have a responsible person to drive you home. 8. Someone MUST be with you the first 24 hours  after you arrive home or your discharge will be delayed. 9. Please wear clothes that are easy to get on and off and wear slip-on shoes.  Thank you for allowing [place] to care for you!   -- [facility]   Follow-Up: Your physician recommends that you schedule a follow-up appointment in: 1 month  Any Other Special Instructions Will Be Listed Below (If Applicable).     If you need a refill on your cardiac medications before your next appointment, please call your pharmacy.   [place], RN, BSN   Coronary Angiogram With Stent Coronary angiogram with stent placement is a procedure to widen or open a narrow blood vessel of the heart (coronary artery). Arteries may become blocked by cholesterol buildup (plaques) in the lining of the wall. When a coronary artery becomes partially blocked, blood flow to that area decreases. This may lead to chest pain or a heart attack (myocardial infarction). A stent is a small piece of metal that looks like mesh or a spring. Stent placement may be done as treatment for a heart attack or right after a coronary angiogram in which a blocked artery is found. Let your health care provider know about:  Any allergies you have.  All medicines you are taking, including vitamins, herbs, eye drops, creams, and over-the-counter medicines.  Any problems you or family members have had with anesthetic medicines.  Any blood disorders you have.  Any surgeries you have had.  Any medical conditions you have.  Whether you are pregnant or may be pregnant. What are the risks? Generally, this is a  safe procedure. However, problems may occur, including:  Damage to the heart or its blood vessels.  A return of blockage.  Bleeding, infection, or bruising at the insertion site.  A collection of blood under the skin (hematoma) at the insertion site.  A blood clot in another part of the body.  Kidney injury.  Allergic  reaction to the dye or contrast that is used.  Bleeding into the abdomen (retroperitoneal bleeding).  What happens before the procedure? Staying hydrated Follow instructions from your health care provider about hydration, which may include:  Up to 2 hours before the procedure - you may continue to drink clear liquids, such as water, clear fruit juice, black coffee, and plain tea.  Eating and drinking restrictions Follow instructions from your health care provider about eating and drinking, which may include:  8 hours before the procedure - stop eating heavy meals or foods such as meat, fried foods, or fatty foods.  6 hours before the procedure - stop eating light meals or foods, such as toast or cereal.  2 hours before the procedure - stop drinking clear liquids.  Ask your health care provider about:  Changing or stopping your regular medicines. This is especially important if you are taking diabetes medicines or blood thinners.  Taking medicines such as ibuprofen. These medicines can thin your blood. Do not take these medicines before your procedure if your health care provider instructs you not to. Generally, aspirin is recommended before a procedure of passing a small, thin tube (catheter) through a blood vessel and into the heart (cardiac catheterization).  What happens during the procedure?  An IV tube will be inserted into one of your veins.  You will be given one or more of the following: ? A medicine to help you relax (sedative). ? A medicine to numb the area where the catheter will be inserted into an artery (local anesthetic).  To reduce your risk of infection: ? Your health care team will wash or sanitize their hands. ? Your skin will be washed with soap. ? Hair may be removed from the area where the catheter will be inserted.  Using a guide wire, the catheter will be inserted into an artery. The location may be in your groin, in your wrist, or in the fold of your arm  (near your elbow).  A type of X-ray (fluoroscopy) will be used to help guide the catheter to the opening of the arteries in the heart.  A dye will be injected into the catheter, and X-rays will be taken. The dye will help to show where any narrowing or blockages are located in the arteries.  A tiny wire will be guided to the blocked spot, and a balloon will be inflated to make the artery wider.  The stent will be expanded and will crush the plaques into the wall of the vessel. The stent will hold the area open and improve the blood flow. Most stents have a drug coating to reduce the risk of the stent narrowing over time.  The artery may be made wider using a drill, laser, or other tools to remove plaques.  When the blood flow is better, the catheter will be removed. The lining of the artery will grow over the stent, which stays where it was placed. This procedure may vary among health care providers and hospitals. What happens after the procedure?  If the procedure is done through the leg, you will be kept in bed lying flat for about 6  hours. You will be instructed to not bend and not cross your legs.  The insertion site will be checked frequently.  The pulse in your foot or wrist will be checked frequently.  You may have additional blood tests, X-rays, and a test that records the electrical activity of your heart (electrocardiogram, or ECG). This information is not intended to replace advice given to you by your health care provider. Make sure you discuss any questions you have with your health care provider. Document Released: 02/25/2003 Document Revised: 04/20/2016 Document Reviewed: 03/26/2016 Elsevier Interactive Patient Education  [employer].

## 2018-05-02 NOTE — H&P (View-Only) (Signed)
Cardiology Office Note:    Date:  05/02/2018   ID:  [name], DOB March 30, 1941, MRN [id number]  PCP:  [name], MD  Cardiologist:  Jenean Lindau, MD   Referring MD: [name], MD    ASSESSMENT:    1. Severe aortic stenosis   2. Coronary artery disease involving native coronary artery of native heart without angina pectoris   3. Essential hypertension   4. Hx of CABG   5. Mixed dyslipidemia    PLAN:    In order of problems listed above:  1. In view of the above findings I discussed coronary angiography and left heart catheterization.  He is agreeable.I discussed coronary angiography and left heart catheterization with the patient at extensive length. Procedure, benefits and potential risks were explained. Patient had multiple questions which were answered to the patient's satisfaction. Patient agreed and consented for the procedure. Further recommendations will be made based on the findings of the coronary angiography. In the interim. The patient has any significant symptoms he knows to go to the nearest emergency room. 2. Secondary prevention stressed to the patient.  Importance of compliance with diet and medication stressed and he vocalized understanding  Medication Adjustments/Labs and Tests Ordered: Current medicines are reviewed at length with the patient today.  Concerns regarding medicines are outlined above.  No orders of the defined types were placed in this encounter.  No orders of the defined types were placed in this encounter.    No chief complaint on file.    History of Present Illness:    Christopher Rocha is a 77 y.o. male.  The patient has coronary artery disease post CABG surgery.  He leads a sedentary lifestyle.  He is in overall good health.  Echocardiogram revealed severe aortic stenosis.  He does have shortness of breath on exertion.  At the time of my evaluation, the patient is alert awake oriented and in no distress.  History  reviewed. No pertinent past medical history.  Past Surgical History:  Procedure Laterality Date  . CARDIAC SURGERY      Current Medications: Current Meds  Medication Sig  . aspirin EC 81 MG tablet Take 81 mg by mouth daily.  Marland Kitchen atorvastatin (LIPITOR) 80 MG tablet Take 1 tablet by mouth daily.  Marland Kitchen losartan-hydrochlorothiazide (HYZAAR) 100-12.5 MG tablet Take 1 tablet by mouth daily.  . nitroGLYCERIN (NITROSTAT) 0.4 MG SL tablet Place 1 tablet (0.4 mg total) under the tongue every 5 (five) minutes as needed.     Allergies:   Penicillins   Social History   Socioeconomic History  . Marital status: Married    Spouse name: Not on file  . Number of children: Not on file  . Years of education: Not on file  . Highest education level: Not on file  Occupational History  . Not on file  Social Needs  . Financial resource strain: Not on file  . Food insecurity:    Worry: Not on file    Inability: Not on file  . Transportation needs:    Medical: Not on file    Non-medical: Not on file  Tobacco Use  . Smoking status: Former Research scientist (life sciences)  . Smokeless tobacco: Never Used  Substance and Sexual Activity  . Alcohol use: Not on file  . Drug use: Not on file  . Sexual activity: Not on file  Lifestyle  . Physical activity:    Days per week: Not on file    Minutes per session:  Not on file  . Stress: Not on file  Relationships  . Social connections:    Talks on phone: Not on file    Gets together: Not on file    Attends religious service: Not on file    Active member of club or organization: Not on file    Attends meetings of clubs or organizations: Not on file    Relationship status: Not on file  Other Topics Concern  . Not on file  Social History Narrative  . Not on file     Family History: The patient's family history includes Clotting disorder in his father; Diabetes in his mother.  ROS:   Please see the history of present illness.    All other systems reviewed and are  negative.  EKGs/Labs/Other Studies Reviewed:    The following studies were reviewed today: I discussed my findings with the patient at extensive length.   Recent Labs: No results found for requested labs within last 8760 hours.  Recent Lipid Panel No results found for: CHOL, TRIG, HDL, CHOLHDL, VLDL, LDLCALC, LDLDIRECT  Physical Exam:    VS:  BP 130/64 (BP Location: Right Arm, Patient Position: Sitting, Cuff Size: Normal)   Pulse 60   Ht 5\' 11"  (1.803 m)   Wt 198 lb (89.8 kg)   SpO2 95%   BMI 27.62 kg/m     Wt Readings from Last 3 Encounters:  05/02/18 198 lb (89.8 kg)  03/25/18 198 lb (89.8 kg)     GEN: Patient is in no acute distress HEENT: Normal NECK: No JVD; No carotid bruits LYMPHATICS: No lymphadenopathy CARDIAC: Hear sounds regular, 2/6 systolic murmur at the apex. RESPIRATORY:  Clear to auscultation without rales, wheezing or rhonchi  ABDOMEN: Soft, non-tender, non-distended MUSCULOSKELETAL:  No edema; No deformity  SKIN: Warm and dry NEUROLOGIC:  Alert and oriented x 3 PSYCHIATRIC:  Normal affect   Signed, Jenean Lindau, MD  05/02/2018 11:19 AM    [place]

## 2018-05-07 ENCOUNTER — Telehealth: Payer: Self-pay

## 2018-05-07 NOTE — Telephone Encounter (Signed)
Patient contacted pre-catheterization at [facility] scheduled for:  05/08/2018 at 1300 Verified arrival time and place:  Admitting at 1100 Confirmed AM meds to be taken pre-cath with sip of water: Take ASA  Hold losartan/HCTZ Confirmed patient has responsible person to drive home post procedure and observe patient for 24 hours:  yes Addl concerns:  none

## 2018-05-08 ENCOUNTER — Other Ambulatory Visit: Payer: Self-pay

## 2018-05-08 ENCOUNTER — Ambulatory Visit (HOSPITAL_COMMUNITY)
Admission: RE | Admit: 2018-05-08 | Discharge: 2018-05-08 | Disposition: A | Discharge status: Home / Self Care | Payer: Medicare Other | Source: Ambulatory Visit | Attending: Cardiovascular Disease | Admitting: Cardiovascular Disease

## 2018-05-08 ENCOUNTER — Encounter (HOSPITAL_COMMUNITY)
Admission: RE | Disposition: A | Discharge status: Home / Self Care | Payer: Self-pay | Source: Ambulatory Visit | Attending: Cardiovascular Disease

## 2018-05-08 DIAGNOSIS — Z9889 Other specified postprocedural states: Secondary | ICD-10-CM | POA: Diagnosis not present

## 2018-05-08 DIAGNOSIS — I10 Essential (primary) hypertension: Secondary | ICD-10-CM | POA: Insufficient documentation

## 2018-05-08 DIAGNOSIS — I2510 Atherosclerotic heart disease of native coronary artery without angina pectoris: Secondary | ICD-10-CM | POA: Insufficient documentation

## 2018-05-08 DIAGNOSIS — Z87891 Personal history of nicotine dependence: Secondary | ICD-10-CM | POA: Diagnosis not present

## 2018-05-08 DIAGNOSIS — I350 Nonrheumatic aortic (valve) stenosis: Secondary | ICD-10-CM

## 2018-05-08 DIAGNOSIS — Z79899 Other long term (current) drug therapy: Secondary | ICD-10-CM | POA: Diagnosis not present

## 2018-05-08 DIAGNOSIS — E782 Mixed hyperlipidemia: Secondary | ICD-10-CM | POA: Insufficient documentation

## 2018-05-08 DIAGNOSIS — Z7982 Long term (current) use of aspirin: Secondary | ICD-10-CM | POA: Insufficient documentation

## 2018-05-08 DIAGNOSIS — Z880 Allergy status to penicillin: Secondary | ICD-10-CM | POA: Diagnosis not present

## 2018-05-08 DIAGNOSIS — I272 Pulmonary hypertension, unspecified: Secondary | ICD-10-CM | POA: Diagnosis not present

## 2018-05-08 DIAGNOSIS — Z951 Presence of aortocoronary bypass graft: Secondary | ICD-10-CM | POA: Insufficient documentation

## 2018-05-08 HISTORY — PX: RIGHT/LEFT HEART CATH AND CORONARY/GRAFT ANGIOGRAPHY: CATH118267

## 2018-05-08 LAB — POCT I-STAT 3, VENOUS BLOOD GAS (G3P V)
Acid-base deficit: 1 mmol/L (ref 0.0–2.0)
Bicarbonate: 25 mmol/L (ref 20.0–28.0)
O2 Saturation: 70 %
PCO2 VEN: 49.6 mmHg (ref 44.0–60.0)
PH VEN: 7.309 (ref 7.250–7.430)
TCO2: 26 mmol/L (ref 22–32)
pO2, Ven: 40 mmHg (ref 32.0–45.0)

## 2018-05-08 LAB — POCT I-STAT 3, ART BLOOD GAS (G3+)
BICARBONATE: 25.4 mmol/L (ref 20.0–28.0)
O2 Saturation: 99 %
PO2 ART: 127 mmHg — AB (ref 83.0–108.0)
TCO2: 27 mmol/L (ref 22–32)
pCO2 arterial: 46.9 mmHg (ref 32.0–48.0)
pH, Arterial: 7.342 — ABNORMAL LOW (ref 7.350–7.450)

## 2018-05-08 SURGERY — RIGHT/LEFT HEART CATH AND CORONARY/GRAFT ANGIOGRAPHY
Anesthesia: LOCAL

## 2018-05-08 MED ORDER — ONDANSETRON HCL 4 MG/2ML IJ SOLN: 4.0000 mg | Freq: Four times a day (QID) | INTRAMUSCULAR | Status: DC | PRN

## 2018-05-08 MED ORDER — HEPARIN SODIUM (PORCINE) 1000 UNIT/ML IJ SOLN
INTRAMUSCULAR | Status: DC | PRN
  Administered 2018-05-08: 5000 [IU] via INTRAVENOUS

## 2018-05-08 MED ORDER — SODIUM CHLORIDE 0.9% FLUSH: 3.0000 mL | INTRAVENOUS | Status: DC | PRN

## 2018-05-08 MED ORDER — VERAPAMIL HCL 2.5 MG/ML IV SOLN
INTRAVENOUS | Status: DC | PRN
  Administered 2018-05-08: 10 mL via INTRA_ARTERIAL

## 2018-05-08 MED ORDER — SODIUM CHLORIDE 0.9 % IV SOLN: INTRAVENOUS | Status: DC

## 2018-05-08 MED ORDER — SODIUM CHLORIDE 0.9 % IV SOLN: 250.0000 mL | INTRAVENOUS | Status: DC | PRN

## 2018-05-08 MED ORDER — SODIUM CHLORIDE 0.9% FLUSH: 3.0000 mL | Freq: Two times a day (BID) | INTRAVENOUS | Status: DC

## 2018-05-08 MED ORDER — SODIUM CHLORIDE 0.9 % WEIGHT BASED INFUSION
3.0000 mL/kg/h | INTRAVENOUS | Status: AC
  Administered 2018-05-08: 3 mL/kg/h via INTRAVENOUS

## 2018-05-08 MED ORDER — LIDOCAINE HCL (PF) 1 % IJ SOLN
INTRAMUSCULAR | Status: AC
  Filled 2018-05-08: qty 30

## 2018-05-08 MED ORDER — VERAPAMIL HCL 2.5 MG/ML IV SOLN
INTRAVENOUS | Status: AC
  Filled 2018-05-08: qty 2

## 2018-05-08 MED ORDER — SODIUM CHLORIDE 0.9 % WEIGHT BASED INFUSION: 1.0000 mL/kg/h | INTRAVENOUS | Status: DC

## 2018-05-08 MED ORDER — HEPARIN SODIUM (PORCINE) 1000 UNIT/ML IJ SOLN
INTRAMUSCULAR | Status: AC
  Filled 2018-05-08: qty 1

## 2018-05-08 MED ORDER — MIDAZOLAM HCL 2 MG/2ML IJ SOLN
INTRAMUSCULAR | Status: DC | PRN
  Administered 2018-05-08: 1 mg via INTRAVENOUS

## 2018-05-08 MED ORDER — ACETAMINOPHEN 325 MG PO TABS: 650.0000 mg | ORAL_TABLET | ORAL | Status: DC | PRN

## 2018-05-08 MED ORDER — MIDAZOLAM HCL 2 MG/2ML IJ SOLN
INTRAMUSCULAR | Status: AC
  Filled 2018-05-08: qty 2

## 2018-05-08 MED ORDER — ASPIRIN 81 MG PO CHEW: 81.0000 mg | CHEWABLE_TABLET | ORAL | Status: DC

## 2018-05-08 MED ORDER — IOHEXOL 350 MG/ML SOLN
INTRAVENOUS | Status: DC | PRN
  Administered 2018-05-08: 110 mL via INTRA_ARTERIAL

## 2018-05-08 MED ORDER — HEPARIN (PORCINE) IN NACL 1000-0.9 UT/500ML-% IV SOLN
INTRAVENOUS | Status: AC
  Filled 2018-05-08: qty 1000

## 2018-05-08 MED ORDER — LIDOCAINE HCL (PF) 1 % IJ SOLN
INTRAMUSCULAR | Status: DC | PRN
  Administered 2018-05-08 (×2): 2 mL via INTRADERMAL

## 2018-05-08 MED ORDER — HEPARIN (PORCINE) IN NACL 1000-0.9 UT/500ML-% IV SOLN
INTRAVENOUS | Status: DC | PRN
  Administered 2018-05-08 (×2): 500 mL

## 2018-05-08 SURGICAL SUPPLY — 13 items
CATH BALLN WEDGE 5F 110CM (CATHETERS) ×2 IMPLANT
CATH INFINITI 5 FR IM (CATHETERS) ×2 IMPLANT
CATH INFINITI 5FR JL4 (CATHETERS) ×2 IMPLANT
DEVICE RAD COMP TR BAND LRG (VASCULAR PRODUCTS) ×2 IMPLANT
GLIDESHEATH SLEND SS 6F .021 (SHEATH) ×2 IMPLANT
GUIDEWIRE INQWIRE 1.5J.035X260 (WIRE) ×1 IMPLANT
INQWIRE 1.5J .035X260CM (WIRE) ×2
KIT HEART LEFT (KITS) ×2 IMPLANT
PACK CARDIAC CATHETERIZATION (CUSTOM PROCEDURE TRAY) ×2 IMPLANT
SHEATH GLIDE SLENDER 4/5FR (SHEATH) ×2 IMPLANT
TRANSDUCER W/STOPCOCK (MISCELLANEOUS) ×2 IMPLANT
TUBING CIL FLEX 10 FLL-RA (TUBING) ×2 IMPLANT
WIRE EMERALD 3MM-J .025X260CM (WIRE) ×2 IMPLANT

## 2018-05-08 NOTE — Interval H&P Note (Signed)
History and Physical Interval Note:  05/08/2018 4:43 PM  Christopher Rocha  has presented today for surgery, with the diagnosis of as  The various methods of treatment have been discussed with the patient and family. After consideration of risks, benefits and other options for treatment, the patient has consented to  Procedure(s): RIGHT/LEFT HEART CATH AND CORONARY/GRAFT ANGIOGRAPHY (N/A) as a surgical intervention .  The patient's history has been reviewed, patient examined, no change in status, stable for surgery.  I have reviewed the patient's chart and labs.  Questions were answered to the patient's satisfaction.     [name]

## 2018-05-08 NOTE — Discharge Instructions (Signed)

## 2018-05-09 ENCOUNTER — Encounter (HOSPITAL_COMMUNITY): Payer: Self-pay | Admitting: Cardiovascular Disease

## 2018-05-13 ENCOUNTER — Encounter (HOSPITAL_COMMUNITY): Payer: Self-pay | Admitting: Cardiovascular Disease

## 2018-05-13 DIAGNOSIS — D649 Anemia, unspecified: Secondary | ICD-10-CM | POA: Diagnosis not present

## 2018-05-14 ENCOUNTER — Telehealth: Payer: Self-pay

## 2018-05-14 ENCOUNTER — Other Ambulatory Visit (HOSPITAL_COMMUNITY)
Admission: RE | Admit: 2018-05-14 | Disposition: A | Discharge status: Home / Self Care | Payer: Medicare Other | Source: Ambulatory Visit | Attending: Oncology | Admitting: Oncology

## 2018-05-14 DIAGNOSIS — D4620 Refractory anemia with excess of blasts, unspecified: Secondary | ICD-10-CM | POA: Diagnosis not present

## 2018-05-14 DIAGNOSIS — D649 Anemia, unspecified: Secondary | ICD-10-CM | POA: Diagnosis not present

## 2018-05-14 DIAGNOSIS — D464 Refractory anemia, unspecified: Secondary | ICD-10-CM | POA: Diagnosis not present

## 2018-05-14 NOTE — Telephone Encounter (Signed)
Nurse with PCP office named Dietrich Pates was given CXR results. Patient will follow another provider regarding this due to PCP not being available. CXR has been faxed to the office. Per [facility] the patient is aware.    Dr. Antionette Char nurse has been made aware before visit tomorrow.

## 2018-05-14 NOTE — Telephone Encounter (Signed)
Left voicemail for the patient to call the office to discuss CXR.

## 2018-05-14 NOTE — Telephone Encounter (Signed)
Left voicemail for the nurse to call the office asap regarding abnormal CXR.

## 2018-05-15 ENCOUNTER — Encounter: Payer: Self-pay | Admitting: Cardiovascular Disease

## 2018-05-15 ENCOUNTER — Ambulatory Visit: Payer: Medicare Other | Admitting: Cardiovascular Disease

## 2018-05-15 VITALS — BP 148/64 | HR 58 | Ht 71.0 in | Wt 200.0 lb

## 2018-05-15 DIAGNOSIS — I10 Essential (primary) hypertension: Secondary | ICD-10-CM | POA: Diagnosis not present

## 2018-05-15 DIAGNOSIS — I350 Nonrheumatic aortic (valve) stenosis: Secondary | ICD-10-CM | POA: Diagnosis not present

## 2018-05-15 DIAGNOSIS — R9389 Abnormal findings on diagnostic imaging of other specified body structures: Secondary | ICD-10-CM

## 2018-05-15 DIAGNOSIS — I2510 Atherosclerotic heart disease of native coronary artery without angina pectoris: Secondary | ICD-10-CM

## 2018-05-15 NOTE — Progress Notes (Signed)
Cardiology Office Note:    Date:  05/15/2018   ID:  Christopher Rocha, DOB [birth year], MRN [id number]  PCP:  [name], MD  Cardiologist:  Dr Geraldo Pitter Electrophysiologist:  None   Referring MD: [name], MD   Chief Complaint  Patient presents with  . Shortness of Breath    History of Present Illness:    Christopher Rocha is a 77 y.o. male presenting for evaluation of severe aortic stenosis, referred by Dr. Geraldo Pitter.  The patient has a history of ischemic heart disease.  In 2014 he presented to [facility] with chest discomfort and underwent cardiac catheterization demonstrating severe multivessel coronary artery disease.  He was taken emergently to [facility] and he underwent two-vessel CABG by Dr. Jerelene Redden.  He has done well since bypass surgery and he maintains an active lifestyle.  The patient recently reestablished cardiology care with Dr. Geraldo Pitter.  He has a long-standing heart murmur dating back over 20 years, but his recent exam was suggestive of significant aortic stenosis and an echocardiogram demonstrated severe aortic stenosis with a mean transvalvular gradient of 60 mmHg.  He underwent a cardiac catheterization and is now referred for review of treatment options.  The patient is here with his wife today.  He has symptoms of mild exertional dyspnea, but primarily is limited by problems with his hips.  He also complains of exercise intolerance and generalized fatigue.  He denies chest pain or pressure.  He has no symptoms of orthopnea, PND, lightheadedness, frank syncope, or heart palpitations.  Past Medical History:  Diagnosis Date  . HLD (hyperlipidemia)   . HTN (hypertension)   . S/P CABG (coronary artery bypass graft)   . Severe aortic stenosis     Past Surgical History:  Procedure Laterality Date  . CARDIAC SURGERY    . RIGHT/LEFT HEART CATH AND CORONARY/GRAFT ANGIOGRAPHY N/A 05/08/2018   Procedure: RIGHT/LEFT HEART CATH AND  CORONARY/GRAFT ANGIOGRAPHY;  Surgeon: Wellington Hampshire, MD;  Location: [facility];  Service: Cardiovascular;  Laterality: N/A;    Current Medications: Current Meds  Medication Sig  . aspirin EC 81 MG tablet Take 81 mg by mouth every other day.   Marland Kitchen atorvastatin (LIPITOR) 80 MG tablet Take 80 mg by mouth daily.   Marland Kitchen ibuprofen (ADVIL,MOTRIN) 200 MG tablet Take 400 mg by mouth every 6 (six) hours as needed for headache or moderate pain.  Marland Kitchen losartan-hydrochlorothiazide (HYZAAR) 100-12.5 MG tablet Take 1 tablet by mouth daily.  . Naphazoline-Glycerin (REDNESS RELIEF OP) Place 1 drop into both eyes daily as needed (for redness/itching).  . nitroGLYCERIN (NITROSTAT) 0.4 MG SL tablet Place 0.4 mg under the tongue every 5 (five) minutes as needed for chest pain.     Allergies:   Penicillins   Social History   Socioeconomic History  . Marital status: Married    Spouse name: Not on file  . Number of children: Not on file  . Years of education: Not on file  . Highest education level: Not on file  Occupational History  . Not on file  Social Needs  . Financial resource strain: Not on file  . Food insecurity:    Worry: Not on file    Inability: Not on file  . Transportation needs:    Medical: Not on file    Non-medical: Not on file  Tobacco Use  . Smoking status: Former Research scientist (life sciences)  . Smokeless tobacco: Never Used  Substance and Sexual Activity  .  Alcohol use: Not on file  . Drug use: Not on file  . Sexual activity: Not on file  Lifestyle  . Physical activity:    Days per week: Not on file    Minutes per session: Not on file  . Stress: Not on file  Relationships  . Social connections:    Talks on phone: Not on file    Gets together: Not on file    Attends religious service: Not on file    Active member of club or organization: Not on file    Attends meetings of clubs or organizations: Not on file    Relationship status: Not on file  Other Topics Concern  . Not on file    Social History Narrative  . Not on file     Family History: The patient's family history includes Clotting disorder in his father; Diabetes in his mother.  ROS:   Please see the history of present illness.    All other systems reviewed and are negative.  EKGs/Labs/Other Studies Reviewed:    The following studies were reviewed today: 2D echocardiogram 04/29/2018: Left ventricle:  The cavity size was normal. Wall thickness was normal. Systolic function was normal. The estimated ejection fraction was in the range of 60% to 65%. Wall motion was normal; there were no regional wall motion abnormalities. Features are consistent with a pseudonormal left ventricular filling pattern, with concomitant abnormal relaxation and increased filling pressure (grade 2 diastolic dysfunction). Doppler parameters are consistent with high ventricular filling pressure.  ------------------------------------------------------------------- Aortic valve:   Trileaflet; severely thickened, moderately calcified leaflets. Cusp separation was severely reduced.  Doppler:   There was critical stenosis.   There was trivial regurgitation.  VTI ratio of LVOT to aortic valve: 0.27. Valve area (VTI): 0.92 cm^2. Indexed valve area (VTI): 0.43 cm^2/m^2. Peak velocity ratio of LVOT to aortic valve: 0.3. Valve area (Vmax): 1.02 cm^2. Indexed valve area (Vmax): 0.48 cm^2/m^2. Mean velocity ratio of LVOT to aortic valve: 0.29. Valve area (Vmean): 0.99 cm^2. Indexed valve area (Vmean): 0.46 cm^2/m^2.    Mean gradient (S): 60 mm Hg. Peak gradient (S): 107 mm Hg.  ------------------------------------------------------------------- Aorta:  The aorta was normal, not dilated, and non-diseased. Aortic root: The aortic root was normal in size.  ------------------------------------------------------------------- Mitral valve:   Moderately calcified annulus. Mildly thickened leaflets . Mobility was not restricted.  Doppler:   Transvalvular velocity was within the normal range. There was no evidence for stenosis. There was mild regurgitation.    Valve area by pressure half-time: 2 cm^2. Indexed valve area by pressure half-time: 0.94 cm^2/m^2. Valve area by continuity equation (using LVOT flow): 2.05 cm^2. Indexed valve area by continuity equation (using LVOT flow): 0.96 cm^2/m^2.    Mean gradient (D): 4 mm Hg. Peak gradient (D): 7 mm Hg.  ------------------------------------------------------------------- Left atrium:  The atrium was moderately dilated.  ------------------------------------------------------------------- Atrial septum:  The septum was normal.  ------------------------------------------------------------------- Right ventricle:  The cavity size was normal. Wall thickness was normal. Systolic function was normal.  ------------------------------------------------------------------- Pulmonic valve:    The valve appears to be grossly normal. Doppler:  Transvalvular velocity was within the normal range. There was no evidence for stenosis.  ------------------------------------------------------------------- Tricuspid valve:   Structurally normal valve.    Doppler: Transvalvular velocity was within the normal range. There was trivial regurgitation.  ------------------------------------------------------------------- Pulmonary artery:   The main pulmonary artery was normal-sized. Systolic pressure was within the normal range.  ------------------------------------------------------------------- Right atrium:  The atrium was normal in size.  -------------------------------------------------------------------  Pericardium:  The pericardium was normal in appearance. There was no pericardial effusion.  ------------------------------------------------------------------- Systemic veins: Inferior vena cava: The vessel was normal in size. The respirophasic diameter changes were in the  normal range (>= 50%), consistent with normal central venous pressure.  ------------------------------------------------------------------- Post procedure conclusions Ascending Aorta:  - The aorta was normal, not dilated, and non-diseased.  ------------------------------------------------------------------- Measurements   Left ventricle                           Value          Reference  LV ID, ED, PLAX chordal                  47    mm       43 - 52  LV ID, ES, PLAX chordal                  34    mm       23 - 38  LV fx shortening, PLAX chordal   (L)     28    %        >=29  LV PW thickness, ED                      12    mm       ----------  IVS/LV PW ratio, ED                      1.08           <=1.3  Stroke volume, 2D                        127   ml       ----------  Stroke volume/bsa, 2D                    59    ml/m^2   ----------  LV e&', lateral                           9.36  cm/s     ----------  LV E/e&', lateral                         14.21          ----------  LV e&', medial                            8.99  cm/s     ----------  LV E/e&', medial                          14.79          ----------  LV e&', average                           9.18  cm/s     ----------  LV E/e&', average                         14.5           ----------    Ventricular septum  Value          Reference  IVS thickness, ED                        13    mm       ----------    LVOT                                     Value          Reference  LVOT ID, S                               21    mm       ----------  LVOT area                                3.46  cm^2     ----------  LVOT peak velocity, S                    153   cm/s     ----------  LVOT mean velocity, S                    103   cm/s     ----------  LVOT VTI, S                              36.8  cm       ----------  LVOT peak gradient, S                    9     mm Hg    ----------    Aortic valve                              Value          Reference  Aortic valve peak velocity, S            517   cm/s     ----------  Aortic valve mean velocity, S            360   cm/s     ----------  Aortic valve VTI, S                      138   cm       ----------  Aortic mean gradient, S                  60    mm Hg    ----------  Aortic peak gradient, S                  107   mm Hg    ----------  VTI ratio, LVOT/AV                       0.27           ----------  Aortic valve area, VTI                   0.92  cm^2     ----------  Aortic valve area/bsa, VTI               0.43  cm^2/m^2 ----------  Velocity ratio, peak, LVOT/AV            0.3            ----------  Aortic valve area, peak velocity         1.02  cm^2     ----------  Aortic valve area/bsa, peak              0.48  cm^2/m^2 ----------  velocity  Velocity ratio, mean, LVOT/AV            0.29           ----------  Aortic valve area, mean velocity         0.99  cm^2     ----------  Aortic valve area/bsa, mean              0.46  cm^2/m^2 ----------  velocity    Aorta                                    Value          Reference  Aortic root ID, ED                       31    mm       ----------  Ascending aorta ID, A-P, S               32    mm       ----------    Left atrium                              Value          Reference  LA ID, A-P, ES                           49    mm       ----------  LA ID/bsa, A-P                   (H)     2.29  cm/m^2   <=2.2  LA volume, S                             115   ml       ----------  LA volume/bsa, S                         53.8  ml/m^2   ----------  LA volume, ES, 1-p A4C                   82.5  ml       ----------  LA volume/bsa, ES, 1-p A4C               38.6  ml/m^2   ----------  LA volume, ES, 1-p A2C                   148   ml       ----------  LA volume/bsa, ES, 1-p A2C  69.3  ml/m^2   ----------    Mitral valve                             Value          Reference  Mitral E-wave peak  velocity              133   cm/s     ----------  Mitral A-wave peak velocity              94.5  cm/s     ----------  Mitral mean velocity, D                  87.2  cm/s     ----------  Mitral deceleration time         (H)     375   ms       150 - 230  Mitral pressure half-time                110   ms       ----------  Mitral mean gradient, D                  4     mm Hg    ----------  Mitral peak gradient, D                  7     mm Hg    ----------  Mitral E/A ratio, peak                   1.4            ----------  Mitral valve area, PHT, DP               2     cm^2     ----------  Mitral valve area/bsa, PHT, DP           0.94  cm^2/m^2 ----------  Mitral valve area, LVOT                  2.05  cm^2     ----------  continuity  Mitral valve area/bsa, LVOT              0.96  cm^2/m^2 ----------  continuity  Mitral annulus VTI, D                    62    cm       ----------    Pulmonary arteries                       Value          Reference  PA pressure, S, DP               (H)     38    mm Hg    <=30    Tricuspid valve                          Value          Reference  Tricuspid regurg peak velocity           274   cm/s     ----------  Tricuspid peak RV-RA gradient            30    mm Hg    ----------  Right atrium                             Value          Reference  RA ID, S-I, ES, A4C              (H)     64.7  mm       34 - 49  RA area, ES, A4C                         17.7  cm^2     8.3 - 19.5  RA volume, ES, A/L                       42.8  ml       ----------  RA volume/bsa, ES, A/L                   20    ml/m^2   ----------    Systemic veins                           Value          Reference  Estimated CVP                            8     mm Hg    ----------    Right ventricle                          Value          Reference  TAPSE                                    19.9  mm       ----------  RV pressure, S, DP               (H)     38    mm Hg    <=30  RV s&', lateral,  S                        8.85  cm/s     ----------  Cardiac Catheterization 05/08/2018: Conclusion     Prox RCA lesion is 50% stenosed.  SVG graft was visualized by angiography and is normal in caliber.  The graft exhibits no disease.  Ost Cx to Prox Cx lesion is 100% stenosed.  Mid LM to Dist LM lesion is 90% stenosed.  Prox LAD lesion is 100% stenosed.  LIMA graft was visualized by angiography and is normal in caliber.  The graft exhibits no disease.   1.  Significant left main and two-vessel coronary artery disease involving the LAD and left circumflex.  Patent LIMA to LAD and SVG to OM1 supplying the whole left circumflex distribution.  Moderate proximal RCA stenosis with no obstructive disease. 2.  Right heart catheterization showed mildly elevated left-sided filling pressures, mild pulmonary hypertension and normal cardiac output.  Prominent V wave on pulmonary capillary wedge pressure tracing suggestive of mitral regurgitation 3.  Severe aortic stenosis by echocardiogram.  I did not attempt to cross the aortic valve.  Recommendations: Recommend evaluation at  the valve clinic for possible TAVR.  In spite of presence of V waves on pulmonary  wedge pressure tracings, I reviewed the echocardiogram and I do not think the mitral regurgitation is more than moderate.  There does seem to be mild degree of mitral stenosis. The aortic valve is heavily calcified with severely restricted opening.    Indications   Nonrheumatic aortic valve stenosis [I35.0 (ICD-10-CM)]  Procedural Details/Technique   Technical Details Procedural Details: The pre-existing IV in the left antecubital vein was exchanged under sterile fashion to a slender sheath. Right heart catheterization was performed using a 5 French Swan-Ganz catheter. Cardiac output was calculated by the Fick method. The left wrist was prepped, draped, and anesthetized with 1% lidocaine. Using the modified Seldinger technique, a 5  French sheath was introduced into the left radial artery. 3 mg of verapamil was administered through the sheath, weight-based unfractionated heparin was administered intravenously. A TIG catheter was used for selective coronary angiography. A JL4 was needed to engage the left main coronary artery. An IM catheter was used to engage the LIMA graft. I did not attempt to cross the aortic valve. Catheter exchanges were performed over an exchange length guidewire. There were no immediate procedural complications. A TR band was used for radial hemostasis at the completion of the procedure. The patient was transferred to the post catheterization recovery area for further monitoring.   Estimated blood loss <50 mL.  During this procedure the patient was administered the following to achieve and maintain moderate conscious sedation: Versed 1 mg, while the patient's heart rate, blood pressure, and oxygen saturation were continuously monitored. The period of conscious sedation was 40 minutes, of which I was present face-to-face 100% of this time.  Coronary Findings   Diagnostic  Dominance: Right  Left Main  Mid LM to Dist LM lesion 90% stenosed  Mid LM to Dist LM lesion is 90% stenosed.  Left Anterior Descending  Prox LAD lesion 100% stenosed  Prox LAD lesion is 100% stenosed.  Left Circumflex  Ost Cx to Prox Cx lesion 100% stenosed  Ost Cx to Prox Cx lesion is 100% stenosed.  Right Coronary Artery  Prox RCA lesion 50% stenosed  Prox RCA lesion is 50% stenosed.  saphenous Graft to 1st Mrg  SVG graft was visualized by angiography and is normal in caliber. The graft exhibits no disease.  LIMA LIMA Graft to Mid LAD  LIMA graft was visualized by angiography and is normal in caliber. The graft exhibits no disease.  Intervention   No interventions have been documented.  Coronary Diagrams   Diagnostic Diagram          EKG:  EKG is not ordered today.    Recent Labs: 05/02/2018: BUN 31;  Creatinine, Ser 1.20; Hemoglobin 10.1; Platelets 266; Potassium 4.7; Sodium 138  Recent Lipid Panel No results found for: CHOL, TRIG, HDL, CHOLHDL, VLDL, LDLCALC, LDLDIRECT  Physical Exam:    VS:  BP (!) 148/64   Pulse (!) 58   Ht 5\' 11"  (1.803 m)   Wt 200 lb (90.7 kg)   SpO2 98%   BMI 27.89 kg/m     Wt Readings from Last 3 Encounters:  05/15/18 200 lb (90.7 kg)  05/08/18 198 lb 12.8 oz (90.2 kg)  05/02/18 198 lb (89.8 kg)     GEN:  Well nourished, well developed in no acute distress HEENT: Normal NECK: No JVD; BL carotid bruits LYMPHATICS: No lymphadenopathy CARDIAC: RRR with a 3/6 harsh late peaking systolic  murmur heard throughout the precordium, no diastolic murmur RESPIRATORY:  Clear to auscultation without rales, wheezing or rhonchi  ABDOMEN: Soft, non-tender, non-distended MUSCULOSKELETAL:  No edema; No deformity  SKIN: Warm and dry NEUROLOGIC:  Alert and oriented x 3 PSYCHIATRIC:  Normal affect   STS Risk Calculator: Risk of Mortality: 2.192% Renal Failure: 2.499% Permanent Stroke: 1.425% Prolonged Ventilation: 7.542% DSW Infection: 0.216% Reoperation: 3.482% Morbidity or Mortality: 11.120% Short Length of Stay: 37.252% Long Length of Stay: 6.742%  ASSESSMENT:    1. Severe aortic stenosis   2. Coronary artery disease involving native coronary artery of native heart without angina pectoris   3. Abnormal chest x-ray      PLAN:    In order of problems listed above:  The patient has severe, stage D1 aortic stenosis.  I have personally reviewed his echo and cath images.  The patient's echo demonstrates preserved LV systolic function with severe calcification of the aortic valve leaflets and Doppler data as outlined above with a mean transvalvular gradient of 60 mmHg.  There is no significant aortic insufficiency.  The mitral valve annulus appears calcified, but there is only mild mitral regurgitation and mild mitral stenosis present.  I have reviewed the natural  history of aortic stenosis with the patient and their family members who are present today. We have discussed the limitations of medical therapy and the poor prognosis associated with symptomatic aortic stenosis. We have reviewed potential treatment options, including palliative medical therapy, conventional surgical aortic valve replacement, and transcatheter aortic valve replacement. We discussed treatment options in the context of the patient's specific comorbid medical conditions.   Considering the patient's age of 77 years old and previous coronary bypass surgery, TAVR is a reasonable treatment option.  He has undergone echo and cath studies as above.  While he had large V waves on his right heart catheterization, I do not think he has significant mitral regurgitation.  His coronary bypass grafts to the LAD and left circumflex distributions remain patent and he has nonobstructive disease involving his dominant right coronary artery.  He understands next steps involved CT angiogram studies of the heart and the chest/abdomen/pelvis.  He will then undergo formal cardiac surgical consultation as part of a multidisciplinary approach to his care.  We discussed TAVR surgery in detail today, including specific risks and expected recovery.  We discussed preoperative testing at length as well.   The patient recently had a chest x-ray performed and this demonstrated an ill-defined infiltrate versus mass lesion in the right upper lung.  This will be further evaluated with his upcoming CT scan of the chest.   Medication Adjustments/Labs and Tests Ordered: Current medicines are reviewed at length with the patient today.  Concerns regarding medicines are outlined above.  Orders Placed This Encounter  Procedures  . CT CORONARY MORPH W/CTA COR W/SCORE W/CA W/CM &/OR WO/CM  . CT ANGIO ABDOMEN PELVIS  W &/OR WO CONTRAST  . CT ANGIO CHEST AORTA W &/OR WO CONTRAST   No orders of the defined types were placed in this  encounter.   Patient Instructions  Medication Instructions:  Your provider recommends that you continue on your current medications as directed. Please refer to the Current Medication list given to you today.    Labwork: None needed  Testing/Procedures: Dr. Burt Knack recommends you have CT SCANS.   Follow-Up: Ander Purpura, the TAVR nurse, will contact you to arrange further appointments.   Any Other Special Instructions Will Be Listed Below (If Applicable).  If you need a refill on your cardiac medications before your next appointment, please call your pharmacy.      Signed, Sherren Mocha, MD  05/15/2018 5:04 PM    [place]

## 2018-05-15 NOTE — Patient Instructions (Signed)
Medication Instructions:  Your provider recommends that you continue on your current medications as directed. Please refer to the Current Medication list given to you today.    Labwork: None needed  Testing/Procedures: Dr. Burt Knack recommends you have CT SCANS.   Follow-Up: Ander Purpura, the TAVR nurse, will contact you to arrange further appointments.   Any Other Special Instructions Will Be Listed Below (If Applicable).     If you need a refill on your cardiac medications before your next appointment, please call your pharmacy.

## 2018-05-17 ENCOUNTER — Other Ambulatory Visit: Payer: Self-pay

## 2018-05-17 DIAGNOSIS — I350 Nonrheumatic aortic (valve) stenosis: Secondary | ICD-10-CM

## 2018-05-20 ENCOUNTER — Encounter: Payer: Self-pay | Admitting: Pulmonary Disease

## 2018-05-20 DIAGNOSIS — D510 Vitamin B12 deficiency anemia due to intrinsic factor deficiency: Secondary | ICD-10-CM | POA: Diagnosis not present

## 2018-05-20 DIAGNOSIS — Z23 Encounter for immunization: Secondary | ICD-10-CM | POA: Diagnosis not present

## 2018-05-21 DIAGNOSIS — I700 Atherosclerosis of aorta: Secondary | ICD-10-CM | POA: Diagnosis not present

## 2018-05-21 DIAGNOSIS — K449 Diaphragmatic hernia without obstruction or gangrene: Secondary | ICD-10-CM | POA: Diagnosis not present

## 2018-05-21 DIAGNOSIS — I2510 Atherosclerotic heart disease of native coronary artery without angina pectoris: Secondary | ICD-10-CM | POA: Diagnosis not present

## 2018-05-21 DIAGNOSIS — R918 Other nonspecific abnormal finding of lung field: Secondary | ICD-10-CM | POA: Diagnosis not present

## 2018-05-21 DIAGNOSIS — R9389 Abnormal findings on diagnostic imaging of other specified body structures: Secondary | ICD-10-CM | POA: Diagnosis not present

## 2018-05-23 ENCOUNTER — Encounter: Payer: Self-pay | Admitting: Pulmonary Disease

## 2018-05-23 ENCOUNTER — Ambulatory Visit: Payer: Medicare Other | Admitting: Pulmonary Disease

## 2018-05-23 DIAGNOSIS — R918 Other nonspecific abnormal finding of lung field: Secondary | ICD-10-CM | POA: Diagnosis not present

## 2018-05-23 NOTE — Progress Notes (Signed)
Subjective:    Patient ID: Christopher Rocha, male    DOB: [birth year], 77 y.o.   MRN: [id number]  HPI  Chief Complaint  Patient presents with  . Pulm Consult    Referred by Dr. Nicki Reaper from [place] for lung mass.     77 year old remote smoker presents for evaluation of incidental finding of right upper lobe lung mass. He underwent emergent CABG in 2014.  He has severe aortic stenosis and is undergoing evaluation for TAVR.  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 He smoked 2 packs/day until age 9 when he quit, more than 60 pack years.  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  He underwent preoperative chest x-ray which showed an ill-defined infiltrate in the right upper lobe. CT chest with contrast 05/20/2018 clarified this to be a 5.3 x 4.1 cm masslike lesion with air bronchograms in the right upper lobe.  There was a bandlike opacity extending towards the pleural from this mass was also patchy and bandlike opacities in the left lower lobe and medial right lower lobe.  There is a 1 cm band was density in the medial right lung apex and a calcified granuloma in the liver lateral renal cysts. He denies significant wheezing.  He reports dyspnea on exertion especially climbing stairs but is able to walk for long distance on level ground     Past Medical History:  Diagnosis Date  . HLD (hyperlipidemia)   . HTN (hypertension)   . S/P CABG (coronary artery bypass graft)   . Severe aortic stenosis    Past Surgical History:  Procedure Laterality Date  . CARDIAC SURGERY    . RIGHT/LEFT HEART CATH AND CORONARY/GRAFT ANGIOGRAPHY N/A 05/08/2018   Procedure: RIGHT/LEFT HEART CATH AND CORONARY/GRAFT ANGIOGRAPHY;  Surgeon: Wellington Hampshire, MD;  Location: [facility];  Service: Cardiovascular;  Laterality: N/A;    Allergies  Allergen Reactions  . Penicillins  Hives and Other (See Comments)    Has patient had a PCN reaction causing immediate rash, facial/tongue/throat swelling, SOB or lightheadedness with hypotension: No Has patient had a PCN reaction causing severe rash involving mucus membranes or skin necrosis: No Has patient had a PCN reaction that required hospitalization: No Has patient had a PCN reaction occurring within the last 10 years: No If all of the above answers are "NO", then may proceed with Cephalosporin use.     Social History   Socioeconomic History  . Marital status: Married    Spouse name: Not on file  . Number of children: Not on file  . Years of education: Not on file  . Highest education level: Not on file  Occupational History  . Not on file  Social Needs  . Financial resource strain: Not on file  . Food insecurity:    Worry: Not on file    Inability: Not on file  . Transportation needs:    Medical: Not on file    Non-medical: Not on file  Tobacco Use  . Smoking status: Former Research scientist (life sciences)  . Smokeless tobacco: Never Used  Substance and Sexual Activity  . Alcohol use: Not on file  . Drug use: Not on file  . Sexual activity: Not on file  Lifestyle  . Physical activity:    Days per week: Not on file    Minutes per session: Not on file  . Stress: Not  on file  Relationships  . Social connections:    Talks on phone: Not on file    Gets together: Not on file    Attends religious service: Not on file    Active member of club or organization: Not on file    Attends meetings of clubs or organizations: Not on file    Relationship status: Not on file  . Intimate partner violence:    Fear of current or ex partner: Not on file    Emotionally abused: Not on file    Physically abused: Not on file    Forced sexual activity: Not on file  Other Topics Concern  . Not on file  Social History Narrative  . Not on file      Family History  Problem Relation Age of Onset  . Diabetes Mother   . Clotting disorder  Father      Review of Systems  Constitutional: Negative for fever and unexpected weight change.  HENT: Negative for congestion, dental problem, ear pain, nosebleeds, postnasal drip, rhinorrhea, sinus pressure, sneezing, sore throat and trouble swallowing.   Eyes: Negative for redness and itching.  Respiratory: Positive for shortness of breath. Negative for cough, chest tightness and wheezing.   Cardiovascular: Negative for palpitations and leg swelling.  Gastrointestinal: Negative for nausea and vomiting.  Genitourinary: Negative for dysuria.  Musculoskeletal: Negative for joint swelling.  Skin: Negative for rash.  Allergic/Immunologic: Negative.  Negative for environmental allergies, food allergies and immunocompromised state.  Neurological: Negative for headaches.  Hematological: Does not bruise/bleed easily.  Psychiatric/Behavioral: Negative for dysphoric mood. The patient is not nervous/anxious.        Objective:   Physical Exam  Gen. Pleasant, well-nourished, in no distress, normal affect ENT - no lesions, no post nasal drip Neck: No JVD, no thyromegaly, no carotid bruits Lungs: no use of accessory muscles, no dullness to percussion, clear without rales or rhonchi  Cardiovascular: Rhythm regular, heart sounds  normal, ESM 3/6 murmurs at apex radiating to carotidsn, no peripheral edema Abdomen: soft and non-tender, no hepatosplenomegaly, BS normal. Musculoskeletal: No deformities, no cyanosis or clubbing Neuro:  alert, non focal       Assessment & Plan:

## 2018-05-23 NOTE — Patient Instructions (Signed)
We discussed finding of masslike hazy area in the right upper lung Schedule PET scan. We will proceed with bronchoscopy and biopsy on 9/26 at [facility] at 11 am Nothing to eat after midnight the night before

## 2018-05-23 NOTE — Assessment & Plan Note (Signed)
this is an area of masslike consolidation in the right upper lung but significantly he does not have any recent symptoms of URI or pneumonia suggest fever or coughing.  As such we do have to consider malignancy in this remote smoker   Schedule PET scan.  The various options of biopsy including bronchoscopy, CT guided needle aspiration and surgical biopsy were discussed.The risks of each procedure including coughing, bleeding and the  chances of lung puncture requiring chest tube were discussed in great detail. The benefits & alternatives including serial follow up were also discussed.   Navigation biopsy would be ideal, there is a bronchogram leading to have the upper lobe mass.  However I feel he would be a high risk candidate for general anesthesia given his severe aortic stenosis  We will proceed with bronchoscopy and biopsy on 9/26 at [facility] at 11 am

## 2018-05-23 NOTE — H&P (View-Only) (Signed)
Subjective:    Patient ID: Christopher Rocha, male    DOB: May 26, 1941, 77 y.o.   MRN: [id number]  HPI  Chief Complaint  Patient presents with  . Pulm Consult    Referred by Dr. Nicki Reaper from [place] for lung mass.     77 year old remote smoker presents for evaluation of incidental finding of right upper lobe lung mass. He underwent emergent CABG in 2014.  He has severe aortic stenosis and is undergoing evaluation for TAVR.  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 He smoked 2 packs/day until age 75 when he quit, more than 60 pack years.  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  He underwent preoperative chest x-ray which showed an ill-defined infiltrate in the right upper lobe. CT chest with contrast 05/20/2018 clarified this to be a 5.3 x 4.1 cm masslike lesion with air bronchograms in the right upper lobe.  There was a bandlike opacity extending towards the pleural from this mass was also patchy and bandlike opacities in the left lower lobe and medial right lower lobe.  There is a 1 cm band was density in the medial right lung apex and a calcified granuloma in the liver lateral renal cysts. He denies significant wheezing.  He reports dyspnea on exertion especially climbing stairs but is able to walk for long distance on level ground     Past Medical History:  Diagnosis Date  . HLD (hyperlipidemia)   . HTN (hypertension)   . S/P CABG (coronary artery bypass graft)   . Severe aortic stenosis    Past Surgical History:  Procedure Laterality Date  . CARDIAC SURGERY    . RIGHT/LEFT HEART CATH AND CORONARY/GRAFT ANGIOGRAPHY N/A 05/08/2018   Procedure: RIGHT/LEFT HEART CATH AND CORONARY/GRAFT ANGIOGRAPHY;  Surgeon: Wellington Hampshire, MD;  Location: [facility];  Service: Cardiovascular;  Laterality: N/A;    Allergies  Allergen Reactions  . Penicillins  Hives and Other (See Comments)    Has patient had a PCN reaction causing immediate rash, facial/tongue/throat swelling, SOB or lightheadedness with hypotension: No Has patient had a PCN reaction causing severe rash involving mucus membranes or skin necrosis: No Has patient had a PCN reaction that required hospitalization: No Has patient had a PCN reaction occurring within the last 10 years: No If all of the above answers are "NO", then may proceed with Cephalosporin use.     Social History   Socioeconomic History  . Marital status: Married    Spouse name: Not on file  . Number of children: Not on file  . Years of education: Not on file  . Highest education level: Not on file  Occupational History  . Not on file  Social Needs  . Financial resource strain: Not on file  . Food insecurity:    Worry: Not on file    Inability: Not on file  . Transportation needs:    Medical: Not on file    Non-medical: Not on file  Tobacco Use  . Smoking status: Former Research scientist (life sciences)  . Smokeless tobacco: Never Used  Substance and Sexual Activity  . Alcohol use: Not on file  . Drug use: Not on file  . Sexual activity: Not on file  Lifestyle  . Physical activity:    Days per week: Not on file    Minutes per session: Not on file  . Stress: Not  on file  Relationships  . Social connections:    Talks on phone: Not on file    Gets together: Not on file    Attends religious service: Not on file    Active member of club or organization: Not on file    Attends meetings of clubs or organizations: Not on file    Relationship status: Not on file  . Intimate partner violence:    Fear of current or ex partner: Not on file    Emotionally abused: Not on file    Physically abused: Not on file    Forced sexual activity: Not on file  Other Topics Concern  . Not on file  Social History Narrative  . Not on file      Family History  Problem Relation Age of Onset  . Diabetes Mother   . Clotting disorder  Father      Review of Systems  Constitutional: Negative for fever and unexpected weight change.  HENT: Negative for congestion, dental problem, ear pain, nosebleeds, postnasal drip, rhinorrhea, sinus pressure, sneezing, sore throat and trouble swallowing.   Eyes: Negative for redness and itching.  Respiratory: Positive for shortness of breath. Negative for cough, chest tightness and wheezing.   Cardiovascular: Negative for palpitations and leg swelling.  Gastrointestinal: Negative for nausea and vomiting.  Genitourinary: Negative for dysuria.  Musculoskeletal: Negative for joint swelling.  Skin: Negative for rash.  Allergic/Immunologic: Negative.  Negative for environmental allergies, food allergies and immunocompromised state.  Neurological: Negative for headaches.  Hematological: Does not bruise/bleed easily.  Psychiatric/Behavioral: Negative for dysphoric mood. The patient is not nervous/anxious.        Objective:   Physical Exam  Gen. Pleasant, well-nourished, in no distress, normal affect ENT - no lesions, no post nasal drip Neck: No JVD, no thyromegaly, no carotid bruits Lungs: no use of accessory muscles, no dullness to percussion, clear without rales or rhonchi  Cardiovascular: Rhythm regular, heart sounds  normal, ESM 3/6 murmurs at apex radiating to carotidsn, no peripheral edema Abdomen: soft and non-tender, no hepatosplenomegaly, BS normal. Musculoskeletal: No deformities, no cyanosis or clubbing Neuro:  alert, non focal       Assessment & Plan:

## 2018-05-27 ENCOUNTER — Encounter (HOSPITAL_COMMUNITY): Payer: Self-pay | Admitting: Oncology

## 2018-05-28 DIAGNOSIS — R918 Other nonspecific abnormal finding of lung field: Secondary | ICD-10-CM | POA: Diagnosis not present

## 2018-05-28 DIAGNOSIS — D509 Iron deficiency anemia, unspecified: Secondary | ICD-10-CM | POA: Diagnosis not present

## 2018-05-29 DIAGNOSIS — D509 Iron deficiency anemia, unspecified: Secondary | ICD-10-CM | POA: Diagnosis not present

## 2018-05-30 ENCOUNTER — Ambulatory Visit (HOSPITAL_BASED_OUTPATIENT_CLINIC_OR_DEPARTMENT_OTHER)
Admission: RE | Admit: 2018-05-30 | Discharge: 2018-05-30 | Disposition: A | Discharge status: Home / Self Care | Payer: Medicare Other | Source: Ambulatory Visit | Attending: Cardiovascular Disease | Admitting: Cardiovascular Disease

## 2018-05-30 ENCOUNTER — Ambulatory Visit (HOSPITAL_COMMUNITY)
Admission: RE | Admit: 2018-05-30 | Discharge: 2018-05-30 | Disposition: A | Discharge status: Home / Self Care | Payer: Medicare Other | Source: Ambulatory Visit | Attending: Cardiovascular Disease | Admitting: Cardiovascular Disease

## 2018-05-30 DIAGNOSIS — I2510 Atherosclerotic heart disease of native coronary artery without angina pectoris: Secondary | ICD-10-CM | POA: Insufficient documentation

## 2018-05-30 DIAGNOSIS — I350 Nonrheumatic aortic (valve) stenosis: Secondary | ICD-10-CM | POA: Diagnosis not present

## 2018-05-30 DIAGNOSIS — I6523 Occlusion and stenosis of bilateral carotid arteries: Secondary | ICD-10-CM | POA: Insufficient documentation

## 2018-05-30 DIAGNOSIS — R911 Solitary pulmonary nodule: Secondary | ICD-10-CM | POA: Diagnosis not present

## 2018-05-30 DIAGNOSIS — I700 Atherosclerosis of aorta: Secondary | ICD-10-CM | POA: Diagnosis not present

## 2018-05-30 LAB — PULMONARY FUNCTION TEST
DL/VA % PRED: 77 %
DL/VA: 3.62 ml/min/mmHg/L
DLCO unc % pred: 53 %
DLCO unc: 17.88 ml/min/mmHg
FEF 25-75 POST: 1.08 L/s
FEF 25-75 PRE: 0.61 L/s
FEF2575-%CHANGE-POST: 75 %
FEF2575-%PRED-POST: 48 %
FEF2575-%Pred-Pre: 27 %
FEV1-%Change-Post: 20 %
FEV1-%Pred-Post: 60 %
FEV1-%Pred-Pre: 49 %
FEV1-PRE: 1.55 L
FEV1-Post: 1.86 L
FEV1FVC-%Change-Post: 9 %
FEV1FVC-%PRED-PRE: 75 %
FEV6-%CHANGE-POST: 12 %
FEV6-%PRED-POST: 72 %
FEV6-%PRED-PRE: 64 %
FEV6-POST: 2.92 L
FEV6-Pre: 2.6 L
FEV6FVC-%CHANGE-POST: 2 %
FEV6FVC-%PRED-POST: 99 %
FEV6FVC-%Pred-Pre: 97 %
FVC-%CHANGE-POST: 9 %
FVC-%Pred-Post: 72 %
FVC-%Pred-Pre: 66 %
FVC-Post: 3.11 L
POST FEV6/FVC RATIO: 94 %
PRE FEV6/FVC RATIO: 91 %
Post FEV1/FVC ratio: 60 %
Pre FEV1/FVC ratio: 54 %
RV % pred: 163 %
RV: 4.34 L
TLC % pred: 97 %
TLC: 7.1 L

## 2018-05-30 MED ORDER — IOPAMIDOL (ISOVUE-370) INJECTION 76%
100.0000 mL | Freq: Once | INTRAVENOUS | Status: AC | PRN
  Administered 2018-05-30: 100 mL via INTRAVENOUS

## 2018-05-30 MED ORDER — ALBUTEROL SULFATE (2.5 MG/3ML) 0.083% IN NEBU
2.5000 mg | INHALATION_SOLUTION | Freq: Once | RESPIRATORY_TRACT | Status: AC
  Administered 2018-05-30: 2.5 mg via RESPIRATORY_TRACT

## 2018-05-30 NOTE — Progress Notes (Signed)
Patient receiving no medications for CT heart test. To be D/C by staff after exam.

## 2018-05-30 NOTE — Progress Notes (Signed)
Carotid artery duplex has been completed. 1-39% ICA stenosis bilaterally.  05/30/18 10:25 AM Christopher Rocha RVT

## 2018-05-31 ENCOUNTER — Ambulatory Visit (HOSPITAL_COMMUNITY): Payer: Medicare Other

## 2018-05-31 ENCOUNTER — Other Ambulatory Visit: Payer: Self-pay

## 2018-05-31 ENCOUNTER — Encounter (HOSPITAL_COMMUNITY)
Admission: RE | Disposition: A | Discharge status: Home / Self Care | Payer: Self-pay | Source: Ambulatory Visit | Attending: Pulmonary Disease

## 2018-05-31 ENCOUNTER — Ambulatory Visit (HOSPITAL_COMMUNITY)
Admission: RE | Admit: 2018-05-31 | Discharge: 2018-05-31 | Disposition: A | Discharge status: Home / Self Care | Payer: Medicare Other | Source: Ambulatory Visit | Attending: Pulmonary Disease | Admitting: Pulmonary Disease

## 2018-05-31 DIAGNOSIS — Z880 Allergy status to penicillin: Secondary | ICD-10-CM | POA: Insufficient documentation

## 2018-05-31 DIAGNOSIS — Z87891 Personal history of nicotine dependence: Secondary | ICD-10-CM | POA: Diagnosis not present

## 2018-05-31 DIAGNOSIS — R918 Other nonspecific abnormal finding of lung field: Secondary | ICD-10-CM | POA: Insufficient documentation

## 2018-05-31 DIAGNOSIS — R848 Other abnormal findings in specimens from respiratory organs and thorax: Secondary | ICD-10-CM | POA: Diagnosis not present

## 2018-05-31 DIAGNOSIS — I350 Nonrheumatic aortic (valve) stenosis: Secondary | ICD-10-CM | POA: Insufficient documentation

## 2018-05-31 DIAGNOSIS — Z9889 Other specified postprocedural states: Secondary | ICD-10-CM

## 2018-05-31 DIAGNOSIS — I10 Essential (primary) hypertension: Secondary | ICD-10-CM | POA: Diagnosis not present

## 2018-05-31 DIAGNOSIS — E785 Hyperlipidemia, unspecified: Secondary | ICD-10-CM | POA: Diagnosis not present

## 2018-05-31 DIAGNOSIS — Z951 Presence of aortocoronary bypass graft: Secondary | ICD-10-CM | POA: Diagnosis not present

## 2018-05-31 DIAGNOSIS — R911 Solitary pulmonary nodule: Secondary | ICD-10-CM

## 2018-05-31 HISTORY — PX: VIDEO BRONCHOSCOPY: SHX5072

## 2018-05-31 SURGERY — BRONCHOSCOPY, WITH FLUOROSCOPY
Anesthesia: Moderate Sedation | Laterality: Bilateral

## 2018-05-31 MED ORDER — MIDAZOLAM HCL 10 MG/2ML IJ SOLN
INTRAMUSCULAR | Status: DC | PRN
  Administered 2018-05-31 (×2): 1 mg via INTRAVENOUS

## 2018-05-31 MED ORDER — LIDOCAINE HCL URETHRAL/MUCOSAL 2 % EX GEL
CUTANEOUS | Status: DC | PRN
  Administered 2018-05-31: 1

## 2018-05-31 MED ORDER — LIDOCAINE HCL 2 % EX GEL
1.0000 "application " | Freq: Once | CUTANEOUS | Status: DC
  Filled 2018-05-31: qty 5

## 2018-05-31 MED ORDER — FENTANYL CITRATE (PF) 100 MCG/2ML IJ SOLN
INTRAMUSCULAR | Status: DC | PRN
  Administered 2018-05-31: 50 ug via INTRAVENOUS
  Administered 2018-05-31: 25 ug via INTRAVENOUS

## 2018-05-31 MED ORDER — PHENYLEPHRINE HCL 0.25 % NA SOLN
NASAL | Status: DC | PRN
  Administered 2018-05-31: 2 via NASAL

## 2018-05-31 MED ORDER — SODIUM CHLORIDE 0.9 % IV SOLN
INTRAVENOUS | Status: DC
  Administered 2018-05-31: 11:00:00 via INTRAVENOUS

## 2018-05-31 MED ORDER — FENTANYL CITRATE (PF) 100 MCG/2ML IJ SOLN
INTRAMUSCULAR | Status: AC
  Filled 2018-05-31: qty 4

## 2018-05-31 MED ORDER — MIDAZOLAM HCL 5 MG/ML IJ SOLN
INTRAMUSCULAR | Status: AC
  Filled 2018-05-31: qty 2

## 2018-05-31 MED ORDER — BUTAMBEN-TETRACAINE-BENZOCAINE 2-2-14 % EX AERO: 1.0000 | INHALATION_SPRAY | Freq: Once | CUTANEOUS | Status: DC

## 2018-05-31 MED ORDER — LIDOCAINE HCL 1 % IJ SOLN
INTRAMUSCULAR | Status: DC | PRN
  Administered 2018-05-31: 6 mL via RESPIRATORY_TRACT

## 2018-05-31 MED ORDER — PHENYLEPHRINE HCL 0.25 % NA SOLN: 1.0000 | Freq: Four times a day (QID) | NASAL | Status: DC | PRN

## 2018-05-31 NOTE — Progress Notes (Signed)
Video Bronchoscopy done Intervention Bronchial washing Intervention Bronchial biopsy Intervention Bronchial brushing Procedure tolerated well

## 2018-05-31 NOTE — Discharge Instructions (Signed)
Flexible Bronchoscopy, Care After These instructions give you information on caring for yourself after your procedure. Your doctor may also give you more specific instructions. Call your doctor if you have any problems or questions after your procedure. Follow these instructions at home:  Do not eat or drink anything for 2 hours after your procedure. If you try to eat or drink before the medicine wears off, food or drink could go into your lungs. You could also burn yourself.  After 2 hours have passed and when you can cough and gag normally, you may eat soft food and drink liquids slowly.  The day after the test, you may eat your normal diet.  You may do your normal activities.  Keep all doctor visits. Get help right away if:  You get more and more short of breath.  You get light-headed.  You feel like you are going to pass out (faint).  You have chest pain.  You have new problems that worry you.  You cough up more than a little blood.  You cough up more blood than before. This information is not intended to replace advice given to you by your health care provider. Make sure you discuss any questions you have with your health care provider. Document Released: 06/18/2009 Document Revised: 01/27/2016 Document Reviewed: 04/25/2013 Elsevier Interactive Patient Education  2017 [place]. Nothing to eat or drink until  1:30  pm today 05/31/2018 Any questions or concerns please call the office at  [phone]

## 2018-05-31 NOTE — Op Note (Signed)
Indication: RUL  unexplained pulmonary infiltrates in the 77 -year-old remote smoker  Written informed consent was obtained from the patient prior to the procedure. The risks of the procedure including coughing, bleeding and a small chance of lung cancer requiring a chest tube were discussed with the patient in great detail and evidenced understanding.  2 mg of Versed and  28mg of fentanyl were used in divided doses during the procedure. Bronchoscope was inserted from the right Nare. The upper airway appeared normal. Vocal cord showed normal appearance in motion. The trachea bronchial tree was then examined to the subsegmental level. Minimal clear secretions were noted. No endobronchial lesions were noted.  Attention was then turned to the right upper lobe.  Brushings x 2  & Transbronchial biopsies x5 were obtained from the apical & posterior subsegments of the right upper lobe. Bronchoalveolar lavage was obtained from the right upper lobe with good return.The patient tolerated procedure well with minimal bleeding.  A portable chest XR. will be performed to rule out presence of pneumothorax. He was awake and alert in the end of the procedure.  RLeanna SatoAElsworth SohoMD [phone]

## 2018-05-31 NOTE — Interval H&P Note (Signed)
History and Physical Interval Note:  05/31/2018 10:19 AM  Christopher Rocha  has presented today for surgery, with the diagnosis of lung mass  The various methods of treatment have been discussed with the patient and family. After consideration of risks, benefits and other options for treatment, the patient has consented to  Procedure(s): VIDEO BRONCHOSCOPY WITH FLUORO (Bilateral) as a surgical intervention .  The patient's history has been reviewed, patient examined, no change in status, stable for surgery.  I have reviewed the patient's chart and labs.  Questions were answered to the patient's satisfaction.     Leanna Sato Elsworth Soho

## 2018-06-03 ENCOUNTER — Encounter (HOSPITAL_COMMUNITY): Payer: Self-pay | Admitting: Pulmonary Disease

## 2018-06-03 LAB — CULTURE, BAL-QUANTITATIVE

## 2018-06-03 LAB — CULTURE, BAL-QUANTITATIVE W GRAM STAIN

## 2018-06-04 ENCOUNTER — Other Ambulatory Visit: Payer: Self-pay | Admitting: *Deleted

## 2018-06-04 MED ORDER — LEVOFLOXACIN 500 MG PO TABS: 500.0000 mg | ORAL_TABLET | Freq: Every day | ORAL | 0 refills | Status: DC

## 2018-06-05 ENCOUNTER — Ambulatory Visit: Payer: Medicare Other | Admitting: Physical Therapy

## 2018-06-05 ENCOUNTER — Encounter: Payer: Medicare Other | Admitting: Surgery

## 2018-06-06 ENCOUNTER — Ambulatory Visit (HOSPITAL_COMMUNITY): Payer: Medicare Other

## 2018-06-06 ENCOUNTER — Ambulatory Visit: Payer: Medicare Other | Admitting: Cardiology

## 2018-06-07 ENCOUNTER — Encounter: Payer: Self-pay | Admitting: Cardiology

## 2018-06-13 ENCOUNTER — Ambulatory Visit (HOSPITAL_COMMUNITY)
Admission: RE | Admit: 2018-06-13 | Discharge: 2018-06-13 | Disposition: A | Discharge status: Home / Self Care | Payer: Medicare Other | Source: Ambulatory Visit | Attending: Pulmonary Disease | Admitting: Pulmonary Disease

## 2018-06-13 DIAGNOSIS — R911 Solitary pulmonary nodule: Secondary | ICD-10-CM | POA: Insufficient documentation

## 2018-06-13 DIAGNOSIS — R918 Other nonspecific abnormal finding of lung field: Secondary | ICD-10-CM | POA: Diagnosis not present

## 2018-06-13 DIAGNOSIS — J181 Lobar pneumonia, unspecified organism: Secondary | ICD-10-CM | POA: Diagnosis not present

## 2018-06-13 LAB — GLUCOSE, CAPILLARY: GLUCOSE-CAPILLARY: 115 mg/dL — AB (ref 70–99)

## 2018-06-13 MED ORDER — FLUDEOXYGLUCOSE F - 18 (FDG) INJECTION
9.9300 | Freq: Once | INTRAVENOUS | Status: AC | PRN
  Administered 2018-06-13: 9.93 via INTRAVENOUS

## 2018-06-14 ENCOUNTER — Encounter: Payer: Self-pay | Admitting: Pulmonary Disease

## 2018-06-14 ENCOUNTER — Ambulatory Visit: Payer: Medicare Other | Admitting: Pulmonary Disease

## 2018-06-14 DIAGNOSIS — R918 Other nonspecific abnormal finding of lung field: Secondary | ICD-10-CM | POA: Diagnosis not present

## 2018-06-14 DIAGNOSIS — I350 Nonrheumatic aortic (valve) stenosis: Secondary | ICD-10-CM

## 2018-06-14 DIAGNOSIS — J449 Chronic obstructive pulmonary disease, unspecified: Secondary | ICD-10-CM

## 2018-06-14 DIAGNOSIS — R911 Solitary pulmonary nodule: Secondary | ICD-10-CM

## 2018-06-14 DIAGNOSIS — J432 Centrilobular emphysema: Secondary | ICD-10-CM | POA: Diagnosis not present

## 2018-06-14 HISTORY — DX: Chronic obstructive pulmonary disease, unspecified: J44.9

## 2018-06-14 NOTE — Assessment & Plan Note (Signed)
He has been asymptomatic for this pulmonary infiltrate.  This does appear masslike.  Bronchoscopy with trans bronchial biopsy and brushings have been negative.  BAL did show Pseudomonas and he was treated with antibiotics. We discussed that he is still at intermediate risk that this could be cancer.  Lesion could be reached by navigation procedure but I think it would be a high risk for general anesthesia given the severe aortic stenosis.  I offered him CT-guided biopsy and we discussed risks and benefits of this procedure. We also discussed the wait and watch approach with repeating scan in 3 months, he prefers this approach

## 2018-06-14 NOTE — Patient Instructions (Signed)
We discussed negative biopsy but still intermediate probability that this could be cancer. Suggest repeat CT chest without contrast in mid December and follow-up after

## 2018-06-14 NOTE — Progress Notes (Signed)
   Subjective:    Patient ID: Christopher Rocha, male    DOB: May 23, 1941, 77 y.o.   MRN: [id number]  HPI  77 year old remote smoker  for FU of  of right upper lobe lung infiltrate He underwent emergent CABG in 2014.  He has severe aortic stenosis and is undergoing evaluation for TAVR.  He was  found to have refractory anemia and underwent bone marrow biopsy on 9/10 and myelodysplastic syndrome is being considered. He smoked 2 packs/day until age 2 when he quit, more than 60 pack years.  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  Underwent bscopy >. BAL - pseudomonas, TBBX neg He was treated with 7 days of Levaquin.  He again denies fevers chills or sputum production.  He was asymptomatic with his infiltrate. We reviewed and discussed PET scan  Significant tests/ events reviewed  CT chest with contrast 05/20/2018 clarified this to be a 5.3 x 4.1 cm masslike lesion with air bronchograms in the right upper lobe.  There was a bandlike opacity extending towards the pleural from this mass was also patchy and bandlike opacities in the left lower lobe and medial right lower lobe.  There is a 1 cm band was density in the medial right lung apex and a calcified granuloma in the liver lateral renal cysts.  PET  03/15/18 >> hypermetabolic RUL & LLL  PFTs 03/5882 ratio 54, FEV1 of 60%, FVC of 72% consistent with moderate airway obstruction, TLC 97%, DLCO 53%  Past Medical History:  Diagnosis Date  . HLD (hyperlipidemia)   . HTN (hypertension)   . S/P CABG (coronary artery bypass graft)   . Severe aortic stenosis      Review of Systems neg for any significant sore throat, dysphagia, itching, sneezing, nasal congestion or excess/ purulent secretions, fever, chills, sweats, unintended wt loss, pleuritic or exertional cp, hempoptysis, orthopnea pnd or change in chronic leg swelling. Also denies presyncope, palpitations, heartburn, abdominal pain, nausea, vomiting, diarrhea or change in  bowel or urinary habits, dysuria,hematuria, rash, arthralgias, visual complaints, headache, numbness weakness or ataxia.     Objective:   Physical Exam   Gen. Pleasant, well-nourished, in no distress, normal affect ENT - no pallor, icterus, no post nasal drip Neck: No JVD, no thyromegaly, no carotid bruits Lungs: no use of accessory muscles, no dullness to percussion, clear without rales or rhonchi  Cardiovascular: Rhythm regular, heart sounds  normal, 3/6 ESM at base, no peripheral edema Abdomen: soft and non-tender, no hepatosplenomegaly, BS normal. Musculoskeletal: No deformities, no cyanosis or clubbing Neuro:  alert, non focal        Assessment & Plan:

## 2018-06-14 NOTE — Assessment & Plan Note (Signed)
Being evaluated for TAVR.  Will discuss with cardiology if it is okay to defer this procedure for 3 months while we await repeat CT.  Alternatively, they can proceed with TAVR since if this ends up being malignancy, he will need aortic valve to be repaired before resection procedure

## 2018-06-14 NOTE — Assessment & Plan Note (Signed)
Moderate degree, hold off bronchodilators for now since he is relatively asymptomatic

## 2018-06-19 DIAGNOSIS — D510 Vitamin B12 deficiency anemia due to intrinsic factor deficiency: Secondary | ICD-10-CM | POA: Diagnosis not present

## 2018-06-21 LAB — ACID FAST SMEAR (AFB, MYCOBACTERIA)

## 2018-06-21 LAB — ACID FAST SMEAR (AFB): ACID FAST SMEAR - AFSCU2: NEGATIVE

## 2018-06-26 ENCOUNTER — Other Ambulatory Visit: Payer: Self-pay

## 2018-06-26 ENCOUNTER — Encounter: Payer: Self-pay | Admitting: Physical Therapy

## 2018-06-26 ENCOUNTER — Institutional Professional Consult (permissible substitution): Payer: Medicare Other | Admitting: Surgery

## 2018-06-26 ENCOUNTER — Ambulatory Visit: Payer: Medicare Other | Attending: Cardiovascular Disease | Admitting: Physical Therapy

## 2018-06-26 VITALS — BP 155/76 | HR 62 | Resp 20 | Ht 71.0 in | Wt 202.0 lb

## 2018-06-26 DIAGNOSIS — I350 Nonrheumatic aortic (valve) stenosis: Secondary | ICD-10-CM

## 2018-06-26 DIAGNOSIS — R2689 Other abnormalities of gait and mobility: Secondary | ICD-10-CM | POA: Diagnosis not present

## 2018-06-26 NOTE — Therapy (Signed)
[place], [place], [address] Phone: [phone]   Fax:  [phone]  Physical Therapy Evaluation  Patient Details  Name: Christopher Rocha MRN: [id number] Date of Birth: [birth year] Referring Provider (PT): Sherren Mocha, MD   Encounter Date: 06/26/2018  PT End of Session - 06/26/18 1454    Visit Number  1    Authorization Type  TAVR one-time evaluation    PT Start Time  1415    PT Stop Time  1451    PT Time Calculation (min)  36 min    Activity Tolerance  Patient tolerated treatment well    Behavior During Therapy  [facility] for tasks assessed/performed       Past Medical History:  Diagnosis Date  . HLD (hyperlipidemia)   . HTN (hypertension)   . S/P CABG (coronary artery bypass graft)   . Severe aortic stenosis     Past Surgical History:  Procedure Laterality Date  . CARDIAC SURGERY    . RIGHT/LEFT HEART CATH AND CORONARY/GRAFT ANGIOGRAPHY N/A 05/08/2018   Procedure: RIGHT/LEFT HEART CATH AND CORONARY/GRAFT ANGIOGRAPHY;  Surgeon: Wellington Hampshire, MD;  Location: [facility];  Service: Cardiovascular;  Laterality: N/A;  . VIDEO BRONCHOSCOPY Bilateral 05/31/2018   Procedure: VIDEO BRONCHOSCOPY WITH FLUORO;  Surgeon: Rigoberto Noel, MD;  Location: [facility];  Service: Cardiopulmonary;  Laterality: Bilateral;    There were no vitals filed for this visit.   Subjective Assessment - 06/26/18 1417    Subjective  It acts funny sometimes. I walk up and down basement steps- doesn't affect my breathing but now I can only do 54 at a time rather than 104. Reports blood count is "about 3 buckets low" did a spinal tap Sept 3 and infusion Sept 17. . Cannot tell a difference.     Currently in Pain?  No/denies         [facility] PT Assessment - 06/26/18 0001      Assessment   Medical Diagnosis  severe aortic stenosis    Referring Provider (PT)  Sherren Mocha, MD    Hand Dominance  Right      Precautions   Precautions  None      Restrictions   Weight Bearing Restrictions  No      Balance Screen   Has the patient fallen in the past 6 months  No      [place] residence    Living Arrangements  Spouse/significant other    Additional Comments  stairs at home      Prior Function   Level of Independence  Independent      Cognition   Overall Cognitive Status  Within Functional Limits for tasks assessed      Sensation   Additional Comments  [facility]      Posture/Postural Control   Posture Comments  forward head with rounded shoulders      ROM / Strength   AROM / PROM / Strength  Strength;AROM      AROM   Overall AROM Comments  limited in bil UE      Strength   Overall Strength Comments  gross UE & LE WFL    Strength Assessment Site  Hand    Right/Left hand  Right;Left    Right Hand Grip (lbs)  75    Left Hand Grip (lbs)  70, 60, 50       [facility] Pre-Surgical Assessment - 06/26/18 0001  5 Meter Walk Test- trial 1  4 sec    5 Meter Walk Test- trial 2  4 sec.     5 Meter Walk Test- trial 3  4 sec.    5 meter walk test average  4 sec    4 Stage Balance Test tolerated for:   2 sec.    Sit To Stand Test- trial 1  11 sec.    6 Minute Walk- Baseline  yes    BP (mmHg)  148/63    HR (bpm)  69    02 Sat (%RA)  96 %    Modified Borg Scale for Dyspnea  0- Nothing at all    Perceived Rate of Exertion (Borg)  6-    6 Minute Walk Post Test  yes    BP (mmHg)  100/62    HR (bpm)  78    02 Sat (%RA)  97 %    Aerobic Endurance Distance Walked  1729    Endurance additional comments  25% disability compared to age related norm           Clinical Impression Statement: Pt is a 77 yo M presenting to OP PT for evaluation prior to possible TAVR surgery due to severe aortic stenosis. Pt reports onset of "acting funny" a few years ago due to heart murmur. Symptoms are limiting endurance on stairs and with long walks. Pt presents with limited UE  ROM and strength WFL and denies musculoskeletal pain.  Pt ambulated a total of 1729 feet in 6 minute walk and reported 2/10 SOB on modified scale for dyspena and 13/20 RPE on Borg's perceived exertion and pain scale at the end of the walk. During the 6 minute walk test, patient's HR increased to 94 BPM and O2 saturation decreased to 97%. Based on the Short Physical Performance Battery, patient has a frailty rating of 10/12 with </= 5/12 considered frail.            Visit Diagnosis: Other abnormalities of gait and mobility     Problem List Patient Active Problem List   Diagnosis Date Noted  . COPD (chronic obstructive pulmonary disease) ([place]) 06/14/2018  . Mass of upper lobe of right lung 05/23/2018  . Severe aortic stenosis 05/02/2018  . Essential hypertension 03/25/2018  . Mixed dyslipidemia 03/25/2018  . CAD (coronary artery disease) 03/25/2018  . Hx of CABG 03/25/2018    Christopher Rocha C. Denaisha Swango PT, DPT 06/26/18 2:58 PM   [facility] [address] [place], [place], [address] Phone: [phone]   Fax:  [phone]  Name: Christopher Rocha MRN: [id number] Date of Birth: [birth year]

## 2018-06-27 ENCOUNTER — Encounter: Payer: Self-pay | Admitting: Surgery

## 2018-06-27 NOTE — Progress Notes (Signed)
Patient ID: Christopher Rocha, male   DOB: 1941/06/03, 77 y.o.   MRN: [id number]  [facility] CONSULTATION REPORT  Referring Provider is Wellington Hampshire, MD PCP is [name], MD  Chief Complaint  Patient presents with  . Aortic Stenosis    Surgical eval for TAVR, review all studies     HPI:  The patient is a 77 year old gentleman with a history of hypertension, hyperlipidemia, and coronary artery disease who underwent coronary bypass graft surgery x2 by Dr. Jerelene Redden at [facility] in 2014.  He has done well since his surgery and has maintained an active lifestyle.  Over the past 6 months he has developed mild exertional shortness of breath and fatigue.  He recently reestablished cardiology care by Dr. Geraldo Pitter and had a follow-up echocardiogram due to history of heart murmur which showed severe aortic stenosis with a mean gradient of 60 mmHg and a peak gradient of 107 mmHg.  The peak velocity ratio was 0.3 and the aortic valve area was 0.99 cm.  Left ventricular ejection fraction was 60 to 65% with grade 2 diastolic dysfunction.  He underwent right and left heart catheterization on 05/08/2018 which showed significant left main and two-vessel coronary disease with a patent LIMA graft to the LAD and a patent saphenous vein graft to the obtuse marginal 1.  The right coronary artery had moderate nonobstructive disease.  The patient is here with his wife today.  He says that he does feel fairly well but has some exertional shortness of breath and fatigue and just does not do as much as he used to a year ago.  He has had no chest pain or pressure.  He occasionally has shortness of breath laying flat.  He has had no dizziness or syncope.  He denies peripheral edema.  Past Medical History:  Diagnosis Date  . HLD (hyperlipidemia)   . HTN (hypertension)   . S/P CABG (coronary artery bypass graft)   .  Severe aortic stenosis     Past Surgical History:  Procedure Laterality Date  . CARDIAC SURGERY    . RIGHT/LEFT HEART CATH AND CORONARY/GRAFT ANGIOGRAPHY N/A 05/08/2018   Procedure: RIGHT/LEFT HEART CATH AND CORONARY/GRAFT ANGIOGRAPHY;  Surgeon: Wellington Hampshire, MD;  Location: [facility];  Service: Cardiovascular;  Laterality: N/A;  . VIDEO BRONCHOSCOPY Bilateral 05/31/2018   Procedure: VIDEO BRONCHOSCOPY WITH FLUORO;  Surgeon: Rigoberto Noel, MD;  Location: [facility];  Service: Cardiopulmonary;  Laterality: Bilateral;    Family History  Problem Relation Age of Onset  . Diabetes Mother   . Clotting disorder Father     Social History   Socioeconomic History  . Marital status: Married    Spouse name: Not on file  . Number of children: Not on file  . Years of education: Not on file  . Highest education level: Not on file  Occupational History  . Not on file  Social Needs  . Financial resource strain: Not on file  . Food insecurity:    Worry: Not on file    Inability: Not on file  . Transportation needs:    Medical: Not on file    Non-medical: Not on file  Tobacco Use  . Smoking status: Former Research scientist (life sciences)  . Smokeless tobacco: Never Used  Substance and Sexual Activity  . Alcohol use: Not on file  . Drug use: Not on file  . Sexual activity: Not  on file  Lifestyle  . Physical activity:    Days per week: Not on file    Minutes per session: Not on file  . Stress: Not on file  Relationships  . Social connections:    Talks on phone: Not on file    Gets together: Not on file    Attends religious service: Not on file    Active member of club or organization: Not on file    Attends meetings of clubs or organizations: Not on file    Relationship status: Not on file  . Intimate partner violence:    Fear of current or ex partner: Not on file    Emotionally abused: Not on file    Physically abused: Not on file    Forced sexual activity: Not on file  Other Topics  Concern  . Not on file  Social History Narrative  . Not on file    Current Outpatient Medications  Medication Sig Dispense Refill  . aspirin EC 81 MG tablet Take 81 mg by mouth every other day.     Marland Kitchen atorvastatin (LIPITOR) 80 MG tablet Take 80 mg by mouth daily.     Marland Kitchen ibuprofen (ADVIL,MOTRIN) 200 MG tablet Take 400 mg by mouth every 6 (six) hours as needed for headache or moderate pain.    Marland Kitchen losartan-hydrochlorothiazide (HYZAAR) 100-12.5 MG tablet Take 1 tablet by mouth daily.    . Naphazoline-Glycerin (REDNESS RELIEF OP) Place 1 drop into both eyes daily as needed (for redness/itching).    . nitroGLYCERIN (NITROSTAT) 0.4 MG SL tablet Place 0.4 mg under the tongue every 5 (five) minutes as needed for chest pain.     No current facility-administered medications for this visit.     Allergies  Allergen Reactions  . Penicillins Hives and Other (See Comments)    Has patient had a PCN reaction causing immediate rash, facial/tongue/throat swelling, SOB or lightheadedness with hypotension: No Has patient had a PCN reaction causing severe rash involving mucus membranes or skin necrosis: No Has patient had a PCN reaction that required hospitalization: No Has patient had a PCN reaction occurring within the last 10 years: No If all of the above answers are "NO", then may proceed with Cephalosporin use.       Review of Systems:   General:  normal appetite, decreased energy, no weight gain, no weight loss, no fever  Cardiac:  no chest pain with exertion, no chest pain at rest, +SOB with  exertion, occasional resting SOB, no PND, occasional orthopnea, no palpitations, no arrhythmia, no atrial fibrillation, no LE edema, no dizzy spells, no syncope  Respiratory:  + shortness of breath, no home oxygen, + productive cough, no dry cough, no bronchitis, no wheezing, no hemoptysis, no asthma, no pain with inspiration or cough, no sleep apnea, no CPAP at night  GI:   no difficulty swallowing, no reflux,  no frequent heartburn, no hiatal hernia, no abdominal pain, no constipation, no diarrhea, no hematochezia, no hematemesis, no melena  GU:   no dysuria,  no frequency, no urinary tract infection, no hematuria, no enlarged prostate, no kidney stones, no kidney disease  Vascular:  + pain suggestive of claudication, no pain in feet, + leg cramps, no varicose veins, no DVT, no non-healing foot ulcer  Neuro:   no stroke, no TIA's, no seizures, no headaches, no temporary blindness one eye,  no slurred speech, no peripheral neuropathy, no chronic pain, no instability of gait, no memory/cognitive dysfunction  Musculoskeletal: + arthritis,  no joint swelling, no myalgias, no difficulty walking, normal mobility   Skin:   no rash, no itching, no skin infections, no pressure sores or ulcerations  Psych:   no anxiety, no depression, no nervousness, + unusual recent stress  Eyes:   no blurry vision, no floaters, no recent vision changes, + wears glasses or contacts  ENT:   no hearing loss, no loose or painful teeth, full dentures  Hematologic:  + easy bruising, no abnormal bleeding, no clotting disorder, no frequent epistaxis  Endocrine:  no diabetes, does not check CBG's at home         Physical Exam:   BP (!) 155/76   Pulse 62   Resp 20   Ht _0  (1.803 m)   Wt 202 lb (91.6 kg)   SpO2 99% Comment: RA  BMI 28.17 kg/m   General:  Elderly but  well-appearing  HEENT:  Unremarkable, NCAT, PERLA, EOMI, oropharynx clear  Neck:   no JVD, no bruits, no adenopathy or thyromegaly  Chest:   clear to auscultation, symmetrical breath sounds, no wheezes, no rhonchi   CV:   RRR, grade III/VI crescendo/decrescendo murmur heard best at RSB,  no diastolic murmur  Abdomen:  soft, non-tender, no masses or organomegaly  Extremities:  warm, well-perfused, pulses not palpable, no LE edema  Rectal/GU  Deferred  Neuro:   Grossly non-focal and symmetrical throughout  Skin:   Clean and dry, no rashes, no breakdown  STS  Risk Calculator: Risk of Mortality: 2.192% Renal Failure: 2.499% Permanent Stroke: 1.425% Prolonged Ventilation: 7.542% DSW Infection: 0.216% Reoperation: 3.482% Morbidity or Mortality: 11.120% Short Length of Stay: 37.252% Long Length of Stay: 6.742%  Diagnostic Tests:  *[facility]                         [place], [place] [address]                            [phone]  ------------------------------------------------------------------- Transthoracic Echocardiography  Patient:    Rolf, Fells MR #:       [id number] Study Date: 04/29/2018 Gender:     M Age:        56 Height:     180.3 cm Weight:     89.8 kg BSA:        2.14 m^2 Pt. Status: Room:   ATTENDING    Revankar, Les Pou  ORDERING     Jyl Heinz, MD  REFERRING    Jyl Heinz, MD  SONOGRAPHER  Joanie Coddington, RDCS  PERFORMING   [facility] [place]  cc:  ------------------------------------------------------------------- LV EF: 60% -   65%  ------------------------------------------------------------------- Indications:      Dyspnea 786.09.  Murmur 785.2.  ------------------------------------------------------------------- History:   PMH:   Coronary artery disease.  Risk factors: Hypertension. Dyslipidemia.  ------------------------------------------------------------------- Study Conclusions  - Left ventricle: The cavity size was normal. Wall thickness was   normal. Systolic function was normal. The estimated ejection   fraction was in the range of 60% to 65%. Wall motion was normal;   there were no regional wall motion abnormalities. Features are   consistent with a pseudonormal left ventricular filling pattern,   with concomitant abnormal relaxation and increased filling   pressure (grade 2 diastolic  dysfunction). Doppler parameters are   consistent with high ventricular filling pressure. - Aortic valve: Trileaflet; severely thickened,  moderately   calcified leaflets. Cusp separation was severely reduced. There   was critical stenosis. There was trivial regurgitation. Valve   area (VTI): 0.92 cm^2. Valve area (Vmax): 1.02 cm^2. Valve area   (Vmean): 0.99 cm^2. - Mitral valve: Moderately calcified annulus. Mildly thickened   leaflets . There was mild regurgitation. Valve area by pressure   half-time: 2 cm^2. Valve area by continuity equation (using LVOT   flow): 2.05 cm^2. - Left atrium: The atrium was moderately dilated. - Pulmonary arteries: PA peak pressure: 38 mm Hg (S).  ------------------------------------------------------------------- Labs, prior tests, procedures, and surgery: Coronary artery bypass grafting.  ------------------------------------------------------------------- Study data:  No prior study was available for comparison.  Study status:  Routine.  Procedure:  Frequent APC&'s are noted. The patient reported no pain pre or post test. Transthoracic echocardiography. Image quality was suboptimal. The study was technically difficult, as a result of poor sound wave transmission. Intravenous contrast (Definity) was administered.  Study completion:  There were no complications.          Transthoracic echocardiography.  M-mode, complete 2D, spectral Doppler, and color Doppler.  Birthdate:  Patient birthdate: 28-Oct-1940.  Age:  Patient is 77 yr old.  Sex:  Gender: male.    BMI: 27.6 kg/m^2.  Blood pressure:     138/70  Patient status:  Outpatient.  Study date: Study date: 04/29/2018. Study time: 11:04 AM.  Location:  Echo laboratory.  -------------------------------------------------------------------  ------------------------------------------------------------------- Left ventricle:  The cavity size was normal. Wall thickness was normal. Systolic function was normal. The estimated ejection fraction was in the range of 60% to 65%. Wall motion was normal; there were no regional wall motion  abnormalities. Features are consistent with a pseudonormal left ventricular filling pattern, with concomitant abnormal relaxation and increased filling pressure (grade 2 diastolic dysfunction). Doppler parameters are consistent with high ventricular filling pressure.  ------------------------------------------------------------------- Aortic valve:   Trileaflet; severely thickened, moderately calcified leaflets. Cusp separation was severely reduced.  Doppler:   There was critical stenosis.   There was trivial regurgitation.  VTI ratio of LVOT to aortic valve: 0.27. Valve area (VTI): 0.92 cm^2. Indexed valve area (VTI): 0.43 cm^2/m^2. Peak velocity ratio of LVOT to aortic valve: 0.3. Valve area (Vmax): 1.02 cm^2. Indexed valve area (Vmax): 0.48 cm^2/m^2. Mean velocity ratio of LVOT to aortic valve: 0.29. Valve area (Vmean): 0.99 cm^2. Indexed valve area (Vmean): 0.46 cm^2/m^2.    Mean gradient (S): 60 mm Hg. Peak gradient (S): 107 mm Hg.  ------------------------------------------------------------------- Aorta:  The aorta was normal, not dilated, and non-diseased. Aortic root: The aortic root was normal in size.  ------------------------------------------------------------------- Mitral valve:   Moderately calcified annulus. Mildly thickened leaflets . Mobility was not restricted.  Doppler:  Transvalvular velocity was within the normal range. There was no evidence for stenosis. There was mild regurgitation.    Valve area by pressure half-time: 2 cm^2. Indexed valve area by pressure half-time: 0.94 cm^2/m^2. Valve area by continuity equation (using LVOT flow): 2.05 cm^2. Indexed valve area by continuity equation (using LVOT flow): 0.96 cm^2/m^2.    Mean gradient (D): 4 mm Hg. Peak gradient (D): 7 mm Hg.  ------------------------------------------------------------------- Left atrium:  The atrium was moderately  dilated.  ------------------------------------------------------------------- Atrial septum:  The septum was normal.  ------------------------------------------------------------------- Right ventricle:  The cavity size was normal. Wall thickness was normal. Systolic function was normal.  ------------------------------------------------------------------- Pulmonic valve:  The valve appears to be grossly normal. Doppler:  Transvalvular velocity was within the normal range. There was no evidence for stenosis.  ------------------------------------------------------------------- Tricuspid valve:   Structurally normal valve.    Doppler: Transvalvular velocity was within the normal range. There was trivial regurgitation.  ------------------------------------------------------------------- Pulmonary artery:   The main pulmonary artery was normal-sized. Systolic pressure was within the normal range.  ------------------------------------------------------------------- Right atrium:  The atrium was normal in size.  ------------------------------------------------------------------- Pericardium:  The pericardium was normal in appearance. There was no pericardial effusion.  ------------------------------------------------------------------- Systemic veins: Inferior vena cava: The vessel was normal in size. The respirophasic diameter changes were in the normal range (>= 50%), consistent with normal central venous pressure.  ------------------------------------------------------------------- Post procedure conclusions Ascending Aorta:  - The aorta was normal, not dilated, and non-diseased.  ------------------------------------------------------------------- Measurements   Left ventricle                           Value          Reference  LV ID, ED, PLAX chordal                  47    mm       43 - 52  LV ID, ES, PLAX chordal                  34    mm       23 - 38  LV  fx shortening, PLAX chordal   (L)     28    %        >=29  LV PW thickness, ED                      12    mm       ----------  IVS/LV PW ratio, ED                      1.08           <=1.3  Stroke volume, 2D                        127   ml       ----------  Stroke volume/bsa, 2D                    59    ml/m^2   ----------  LV e&', lateral                           9.36  cm/s     ----------  LV E/e&', lateral                         14.21          ----------  LV e&', medial                            8.99  cm/s     ----------  LV E/e&', medial                          14.79          ----------  LV e&', average  9.18  cm/s     ----------  LV E/e&', average                         14.5           ----------    Ventricular septum                       Value          Reference  IVS thickness, ED                        13    mm       ----------    LVOT                                     Value          Reference  LVOT ID, S                               21    mm       ----------  LVOT area                                3.46  cm^2     ----------  LVOT peak velocity, S                    153   cm/s     ----------  LVOT mean velocity, S                    103   cm/s     ----------  LVOT VTI, S                              36.8  cm       ----------  LVOT peak gradient, S                    9     mm Hg    ----------    Aortic valve                             Value          Reference  Aortic valve peak velocity, S            517   cm/s     ----------  Aortic valve mean velocity, S            360   cm/s     ----------  Aortic valve VTI, S                      138   cm       ----------  Aortic mean gradient, S                  60    mm Hg    ----------  Aortic peak gradient, S                  107   mm Hg    ----------  VTI ratio, LVOT/AV                       0.27           ----------  Aortic valve area, VTI                   0.92  cm^2     ----------  Aortic valve  area/bsa, VTI               0.43  cm^2/m^2 ----------  Velocity ratio, peak, LVOT/AV            0.3            ----------  Aortic valve area, peak velocity         1.02  cm^2     ----------  Aortic valve area/bsa, peak              0.48  cm^2/m^2 ----------  velocity  Velocity ratio, mean, LVOT/AV            0.29           ----------  Aortic valve area, mean velocity         0.99  cm^2     ----------  Aortic valve area/bsa, mean              0.46  cm^2/m^2 ----------  velocity    Aorta                                    Value          Reference  Aortic root ID, ED                       31    mm       ----------  Ascending aorta ID, A-P, S               32    mm       ----------    Left atrium                              Value          Reference  LA ID, A-P, ES                           49    mm       ----------  LA ID/bsa, A-P                   (H)     2.29  cm/m^2   <=2.2  LA volume, S                             115   ml       ----------  LA volume/bsa, S                         53.8  ml/m^2   ----------  LA volume, ES, 1-p A4C                   82.5  ml       ----------  LA volume/bsa, ES, 1-p A4C  38.6  ml/m^2   ----------  LA volume, ES, 1-p A2C                   148   ml       ----------  LA volume/bsa, ES, 1-p A2C               69.3  ml/m^2   ----------    Mitral valve                             Value          Reference  Mitral E-wave peak velocity              133   cm/s     ----------  Mitral A-wave peak velocity              94.5  cm/s     ----------  Mitral mean velocity, D                  87.2  cm/s     ----------  Mitral deceleration time         (H)     375   ms       150 - 230  Mitral pressure half-time                110   ms       ----------  Mitral mean gradient, D                  4     mm Hg    ----------  Mitral peak gradient, D                  7     mm Hg    ----------  Mitral E/A ratio, peak                   1.4            ----------  Mitral  valve area, PHT, DP               2     cm^2     ----------  Mitral valve area/bsa, PHT, DP           0.94  cm^2/m^2 ----------  Mitral valve area, LVOT                  2.05  cm^2     ----------  continuity  Mitral valve area/bsa, LVOT              0.96  cm^2/m^2 ----------  continuity  Mitral annulus VTI, D                    62    cm       ----------    Pulmonary arteries                       Value          Reference  PA pressure, S, DP               (H)     38    mm Hg    <=30    Tricuspid valve  Value          Reference  Tricuspid regurg peak velocity           274   cm/s     ----------  Tricuspid peak RV-RA gradient            30    mm Hg    ----------    Right atrium                             Value          Reference  RA ID, S-I, ES, A4C              (H)     64.7  mm       34 - 49  RA area, ES, A4C                         17.7  cm^2     8.3 - 19.5  RA volume, ES, A/L                       42.8  ml       ----------  RA volume/bsa, ES, A/L                   20    ml/m^2   ----------    Systemic veins                           Value          Reference  Estimated CVP                            8     mm Hg    ----------    Right ventricle                          Value          Reference  TAPSE                                    19.9  mm       ----------  RV pressure, S, DP               (H)     38    mm Hg    <=30  RV s&', lateral, S                        8.85  cm/s     ----------  Legend: (L)  and  (H)  mark values outside specified reference range.  ------------------------------------------------------------------- Prepared and Electronically Authenticated by  Shirlee More, MD 2019-08-26T17:29:33  Physicians   Panel Physicians Referring Physician Case Authorizing Physician  Wellington Hampshire, MD (Primary)    Procedures   RIGHT/LEFT HEART CATH AND CORONARY/GRAFT ANGIOGRAPHY  Conclusion     Prox RCA lesion is 50% stenosed.  SVG  graft was visualized by angiography and is normal in caliber.  The graft exhibits no disease.  Ost Cx to Prox Cx lesion is 100% stenosed.  Mid LM to Dist LM lesion is 90% stenosed.  Prox LAD lesion is  100% stenosed.  LIMA graft was visualized by angiography and is normal in caliber.  The graft exhibits no disease.   1.  Significant left main and two-vessel coronary artery disease involving the LAD and left circumflex.  Patent LIMA to LAD and SVG to OM1 supplying the whole left circumflex distribution.  Moderate proximal RCA stenosis with no obstructive disease. 2.  Right heart catheterization showed mildly elevated left-sided filling pressures, mild pulmonary hypertension and normal cardiac output.  Prominent V wave on pulmonary capillary wedge pressure tracing suggestive of mitral regurgitation 3.  Severe aortic stenosis by echocardiogram.  I did not attempt to cross the aortic valve.  Recommendations: Recommend evaluation at the valve clinic for possible TAVR.  In spite of presence of V waves on pulmonary  wedge pressure tracings, I reviewed the echocardiogram and I do not think the mitral regurgitation is more than moderate.  There does seem to be mild degree of mitral stenosis. The aortic valve is heavily calcified with severely restricted opening.    Indications   Nonrheumatic aortic valve stenosis [I35.0 (ICD-10-CM)]  Procedural Details/Technique   Technical Details Procedural Details: The pre-existing IV in the left antecubital vein was exchanged under sterile fashion to a slender sheath. Right heart catheterization was performed using a 5 French Swan-Ganz catheter. Cardiac output was calculated by the Fick method. The left wrist was prepped, draped, and anesthetized with 1% lidocaine. Using the modified Seldinger technique, a 5 French sheath was introduced into the left radial artery. 3 mg of verapamil was administered through the sheath, weight-based unfractionated heparin  was administered intravenously. A TIG catheter was used for selective coronary angiography. A JL4 was needed to engage the left main coronary artery. An IM catheter was used to engage the LIMA graft. I did not attempt to cross the aortic valve. Catheter exchanges were performed over an exchange length guidewire. There were no immediate procedural complications. A TR band was used for radial hemostasis at the completion of the procedure. The patient was transferred to the post catheterization recovery area for further monitoring.   Estimated blood loss <50 mL.  During this procedure the patient was administered the following to achieve and maintain moderate conscious sedation: Versed 1 mg, while the patient's heart rate, blood pressure, and oxygen saturation were continuously monitored. The period of conscious sedation was 40 minutes, of which I was present face-to-face 100% of this time.  Coronary Findings   Diagnostic  Dominance: Right  Left Main  Mid LM to Dist LM lesion 90% stenosed  Mid LM to Dist LM lesion is 90% stenosed.  Left Anterior Descending  Prox LAD lesion 100% stenosed  Prox LAD lesion is 100% stenosed.  Left Circumflex  Ost Cx to Prox Cx lesion 100% stenosed  Ost Cx to Prox Cx lesion is 100% stenosed.  Right Coronary Artery  Prox RCA lesion 50% stenosed  Prox RCA lesion is 50% stenosed.  saphenous Graft to 1st Mrg  SVG graft was visualized by angiography and is normal in caliber. The graft exhibits no disease.  LIMA LIMA Graft to Mid LAD  LIMA graft was visualized by angiography and is normal in caliber. The graft exhibits no disease.  Intervention   No interventions have been documented.  Coronary Diagrams   Diagnostic Diagram       Implants    No implant documentation for this case.  MERGE Images   Show images for CARDIAC CATHETERIZATION   Link to Procedure Log   Procedure Log  Hemo Data    Most Recent Value  Fick Cardiac Output 7.02 L/min  Fick  Cardiac Output Index 3.34 (L/min)/BSA  RA A Wave 9 mmHg  RA V Wave 9 mmHg  RA Mean 7 mmHg  RV Systolic Pressure 44 mmHg  RV Diastolic Pressure 0 mmHg  RV EDP 6 mmHg  PA Systolic Pressure 44 mmHg  PA Diastolic Pressure 16 mmHg  PA Mean 27 mmHg  PW A Wave 20 mmHg  PW V Wave 33 mmHg  PW Mean 18 mmHg  AO Systolic Pressure 025 mmHg  AO Diastolic Pressure 68 mmHg  AO Mean 108 mmHg  QP/QS 1  TPVR Index 8.09 HRUI  TSVR Index 32.34 HRUI  PVR SVR Ratio 0.09  TPVR/TSVR Ratio 0.25    ADDENDUM REPORT: 05/30/2018 10:34  EXAM: OVER-READ INTERPRETATION  CT CHEST  The following report is an over-read performed by radiologist Dr. Rebekah Chesterfield [facility] on 05/30/2018. This over-read does not include interpretation of cardiac or coronary anatomy or pathology. The coronary CTA interpretation by the cardiologist is attached.  COMPARISON:  None.  FINDINGS: Extracardiac findings will be described separately under dictation for contemporaneously obtained CTA chest, abdomen and pelvis.  IMPRESSION: Please see separate dictation for contemporaneously obtained CTA chest, abdomen and pelvis dated 05/30/2018 for full description of relevant extracardiac findings.   Electronically Signed   By: Vinnie Langton M.D.   On: 05/30/2018 10:34   Addended by Etheleen Mayhew, MD on 05/30/2018 10:36 AM    Study Result   CLINICAL DATA:  Aortic Stenosis  EXAM: Cardiac TAVR CT  TECHNIQUE: The patient was scanned on a Siemens Force 852 slice scanner. A 120 kV retrospective scan was triggered in the ascending thoracic aorta at 140 HU's. Gantry rotation speed was 250 msecs and collimation was .6 mm. No beta blockade or nitro were given. The 3D data set was reconstructed in 5% intervals of the R-R cycle. Systolic and diastolic phases were analyzed on a dedicated work station using MPR, MIP and VRT modes. The patient received 80 cc of contrast.  FINDINGS: Aortic  Valve: Tri leaflet calcified with restricted leaflet motion. Extensive bulky calcification of the annulus at the base of the non coronary cusp  Aorta: Severe calcific atherosclerotic debris  Sino-tubular Junction: 29 mm  Ascending Thoracic Aorta: 31 mm  Aortic Arch: 27 mm  Descending Thoracic Aorta: 25 mm  Sinus of Valsalva Measurements:  Non-coronary: 34.5 mm  Right - coronary: 32 mm  Left -   coronary: 34 mm  Coronary Artery Height above Annulus:  Left Main: 9.9 mm above annulus  Right Coronary: 14 mm above annulus  Virtual Basal Annulus Measurements:  Maximum / Minimum Diameter: 30.1 x 23.3 mm  Perimeter: 86 mm  Area: 569 mm2  Coronary Arteries: Sufficient height above annulus for deployment Patent SVG to OM1 and Patent LIMA to LAD  Optimum Fluoroscopic Angle for Delivery: LAO 23 Caudal 12 degrees  IMPRESSION: 1. Calcified tri leaflet AV with annular area of 569 mm2 suitable for a 29 mm Sapien 3 valve  2. Coronary arteries sufficient height above annulus for deployment with patent LIMA to LAD and patent SVG to OM1  3.  No LAA thrombus  4. Optimum angiographic angle for deployment LAO 23 Caudal 12 degrees  5. Severe calcific atherosclerotic debris in thoracic aorta and bulky calcification of the annulus at the base of the non coronary cusp  Jenkins Rouge  Electronically Signed: By: Jenkins Rouge M.D. On: 05/30/2018 10:19  CLINICAL DATA:  77 year old male with history of severe aortic stenosis. Preprocedural study prior to potential transcatheter aortic valve replacement (TAVR) procedure  EXAM: CT ANGIOGRAPHY CHEST, ABDOMEN AND PELVIS  TECHNIQUE: Multidetector CT imaging through the chest, abdomen and pelvis was performed using the standard protocol during bolus administration of intravenous contrast. Multiplanar reconstructed images and MIPs were obtained and reviewed to evaluate the vascular  anatomy.  CONTRAST:  167m ISOVUE-370 IOPAMIDOL (ISOVUE-370) INJECTION 76%  COMPARISON:  None.  FINDINGS: CTA CHEST FINDINGS  Cardiovascular: Heart size is normal. There is no significant pericardial fluid, thickening or pericardial calcification. There is aortic atherosclerosis, as well as atherosclerosis of the great vessels of the mediastinum and the coronary arteries, including calcified atherosclerotic plaque in the left main, left anterior descending, left circumflex and right coronary arteries. Status post median sternotomy for CABG including LIMA to the LAD. Severe thickening calcification of the aortic valve. Severe calcifications of the mitral annulus.  Mediastinum/Lymph Nodes: No pathologically enlarged mediastinal or hilar lymph nodes. Moderate-sized hiatal hernia. No axillary lymphadenopathy.  Lungs/Pleura: There is a large mass in the right upper lobe (axial image 27 of series 8) measuring 4.1 x 4.8 x 6.0 cm. 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 Multifocal scarring in the lung bases bilaterally. No definite consolidative airspace disease. No pleural effusions.  Musculoskeletal/Soft Tissues: Median sternotomy wires. There are no aggressive appearing lytic or blastic lesions noted in the visualized portions of the skeleton.  CTA ABDOMEN AND PELVIS FINDINGS  Hepatobiliary: 7 mm hypervascular lesion in segment 2 of the liver (axial image 81 of series 6), too small to characterize. 9 mm intermediate attenuation lesion in segment 4A of the liver, incompletely characterized. Small calcified granuloma in the right lobe of the liver. No intra or extrahepatic biliary ductal dilatation. Gallbladder is normal in appearance.  Pancreas: No pancreatic mass. No pancreatic ductal dilatation. No pancreatic or  peripancreatic fluid or inflammatory changes.  Spleen: Unremarkable.  Adrenals/Urinary Tract: Multiple low-attenuation lesions in both kidneys, compatible with simple cysts, largest of which is a 9.2 cm exophytic lesion in the anterior aspect of the lower pole the left kidney. Several other subcentimeter low-attenuation lesions in both kidneys are too small to definitively characterize, but are statistically likely to represent tiny cysts. No hydroureteronephrosis. Urinary bladder is normal in appearance. Bilateral adrenal glands are normal in appearance.  Stomach/Bowel: Normal appearance of the intra-abdominal portion of the stomach. No pathologic dilatation of small bowel or colon. Normal appendix.  Vascular/Lymphatic: Aortic atherosclerosis, with vascular findings and measurements pertinent to potential TAVR procedure, as detailed below. No aneurysm or dissection noted in the abdominal or pelvic vasculature. No lymphadenopathy noted in the abdomen or pelvis.  Reproductive: Prostate gland and seminal vesicles are unremarkable in appearance.  Other: No significant volume of ascites.  No pneumoperitoneum.  Musculoskeletal: There are no aggressive appearing lytic or blastic lesions noted in the visualized portions of the skeleton.  VASCULAR MEASUREMENTS PERTINENT TO TAVR:  AORTA:  Minimal Aortic Diameter-14 x 14 mm  Severity of Aortic Calcification-severe  RIGHT PELVIS:  Right Common Iliac Artery -  Minimal Diameter-9.5 x 8.0 mm  Tortuosity-mild  Calcification-severe  Right External Iliac Artery -  Minimal Diameter-7.5 x 2.8 mm  Tortuosity - mild  Calcification - severe  Right Common Femoral Artery -  Minimal Diameter-4.6 x 5.2 mm  Tortuosity - mild  Calcification - severe  LEFT PELVIS:  Left Common Iliac Artery -  Minimal Diameter-8.0 x 5.4 mm  Tortuosity - mild  Calcification - severe  Left External Iliac Artery  -  Minimal Diameter-5.9 x 3.4 mm  Tortuosity - mild  Calcification - severe  Left Common Femoral Artery -  Minimal Diameter-6.3 x 1.9 mm  Tortuosity - mild  Calcification - severe  Review of the MIP images confirms the above findings.  IMPRESSION: 1. Vascular findings and measurements pertinent to potential TAVR procedure, as detailed above. 2. Severe thickening calcification of the aortic valve, compatible with the reported clinical history of severe aortic stenosis. 3. Aortic atherosclerosis, in addition to left main and 3 vessel coronary artery disease. Status post median sternotomy for CABG including LIMA to the LAD. 4. 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 Further evaluation with nonemergent PET-CT is strongly recommended in the near future. 5. Indeterminate liver lesions, as above. These could be definitively characterized with nonemergent MRI of the abdomen with and without IV gadolinium if clinically appropriate. 6. Additional incidental findings, as above.   Electronically Signed   By: Vinnie Langton M.D.   On: 05/30/2018 12:03  CLINICAL DATA:  Initial treatment strategy for RIGHT upper lobe mass.  EXAM: NUCLEAR MEDICINE PET SKULL BASE TO THIGH  TECHNIQUE: 9.9 mCi F-18 FDG was injected intravenously. Full-ring PET imaging was performed from the skull base to thigh after the radiotracer. CT data was obtained and used for attenuation correction and anatomic localization.  Fasting blood glucose: 115 mg/dl  COMPARISON:  CT 05/30/2018  FINDINGS: Mediastinal blood pool activity: SUV max 2.65  NECK: No hypermetabolic lymph nodes in the neck.  Incidental CT findings: none  CHEST: Focus of consolidation in the RIGHT upper lobe is little changed over the 2 week interval from comparison CT measuring 33 x 28 mm compared with 33 by 29 mm. Lesion is hypermetabolic. There  is variable metabolic activity with the most intense activity inferior medially with SUV max equal 5.4.  There is a focus of consolidation/atelectasis in the inferior LEFT lower lobe laterally is also similar to comparison exam measuring 21 mm (image 65/8) which also has metabolic activity with SUV max equal 5.4.  No additional suspicious nodules or masses.  No hypermetabolic mediastinal lymph nodes. No supraclavicular adenopathy.  Incidental CT findings: none  ABDOMEN/PELVIS: There is hypermetabolic activity associated with a hiatal hernia which is favored physiologic. No abnormal activity in the liver. No hypermetabolic upper abdominal lymph nodes. No pelvic lymphadenopathy.  No abnormality of the bowel.  Incidental CT findings: Large LEFT renal cysts.  SKELETON: No focal hypermetabolic activity to suggest skeletal metastasis.  Incidental CT findings: none  IMPRESSION: 1. RIGHT upper lobe consolidation with associated hypermetabolic activity is not changed in 2 week interval. 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 Consider bronchoscopy for tissue sampling. The inferior medial aspect of the lesion is most intense. 2. No evidence of metastatic mediastinal lymphadenopathy. 3. Second post focus of metabolic activity associated atelectasis / consolidation in the LEFT lower lobe is indeterminate. Recommend close attention on follow-up. This lesion would be difficult to sample.   Electronically Signed   By: Suzy Bouchard M.D.   On: 06/13/2018 11:11   Pulmonary function test    Ref Range & Units 4wk ago  FVC-%Pred-Pre % 66   FVC-Post L 3.11   FVC-%Pred-Post % 72   FVC-%Change-Post % 9   FEV1-Pre L  1.55   FEV1-%Pred-Pre % 49   FEV1-Post L 1.86   FEV1-%Pred-Post % 60   FEV1-%Change-Post % 20   FEV6-Pre L 2.60   FEV6-%Pred-Pre % 64   FEV6-Post L 2.92    FEV6-%Pred-Post % 72   FEV6-%Change-Post % 12   Pre FEV1/FVC ratio % 54   FEV1FVC-%Pred-Pre % 75   Post FEV1/FVC ratio % 60   FEV1FVC-%Change-Post % 9   Pre FEV6/FVC Ratio % 91   FEV6FVC-%Pred-Pre % 97   Post FEV6/FVC ratio % 94   FEV6FVC-%Pred-Post % 99   FEV6FVC-%Change-Post % 2   FEF 25-75 Pre L/sec 0.61   FEF2575-%Pred-Pre % 27   FEF 25-75 Post L/sec 1.08   FEF2575-%Pred-Post % 48   FEF2575-%Change-Post % 75   RV L 4.34   RV % pred % 163   TLC L 7.10   TLC % pred % 97   DLCO unc ml/min/mmHg 17.88   DLCO unc % pred % 53   DL/VA ml/min/mmHg/L 3.62   DL/VA % pred % 77         Impression:  This 77 year old gentleman has stage D, critical, symptomatic aortic stenosis with New York Heart Association class II symptoms of exertional fatigue and shortness of breath.  I have personally reviewed his echocardiogram, cardiac cath, and CTA studies.  Echocardiogram shows a trileaflet aortic valve that is severely thickened and moderately calcified with severely reduced leaflet mobility.  The mean gradient is 60 mmHg consistent with severe aortic stenosis.  Left ventricular ejection fraction is 60 to 65% with grade 2 diastolic dysfunction.  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  I agree that aortic valve replacement is indicated in this patient with critical aortic stenosis.  His gated cardiac CTA shows anatomy suitable for a 29 mm Sapien 3 valve with no complicating features.  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  A PET scan showed hypermetabolic activity within this lesion with a SUV max of 5.4.  There is also a small focus of consolidation/atelectasis in the inferior left lower lobe which has  metabolic activity with an SUV max of 5.4 and this is indeterminate.  There were no other suspicious findings on the PET scan.  He underwent flexible bronchoscopy with biopsies and brushings of the right upper lobe by Dr. Elsworth Soho and all the specimens were negative for malignancy.  The cultures did grow Pseudomonas.  The patient has been treated with a one-week course of Levaquin in case this is a pneumonia.  The plan by Dr. Elsworth Soho was to do a follow-up CT scan of the chest in 3 months to reevaluate this.  I discussed the option of proceeding with a CT-guided needle biopsy prior to performing TAVR.  If this was positive for carcinoma I do not think it would change the need for TAVR since he has no signs of metastatic disease.  If the biopsy was negative then we would still proceed with TAVR and continue to follow the lesion on CT scan.  I think the best option is probably to proceed with TAVR now and then do a follow-up CT scan in about 3 months.  If this lesion is unchanged or larger than we need to proceed with further work-up.  The  patient is in agreement with that plan.  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  He also has significant calcific plaque and narrowing at the takeoff of his left subclavian artery so I do not think that is an option.  His left carotid artery comes off the innominate artery and a bovine trunk so I think insertion by either carotid artery could potentially decrease flow in both.  I think the best option for this patient will be to proceed with transapical insertion which will minimize the risk of vascular complications and stroke.  The patient and his wife were counseled at length regarding treatment alternatives for management of severe symptomatic aortic stenosis. The risks and benefits of surgical intervention has been discussed in detail.  Long-term prognosis with medical therapy was discussed. Alternative approaches such as conventional surgical aortic valve replacement, transcatheter aortic valve replacement, and palliative medical therapy were compared and contrasted at length. This discussion was placed in the context of the patient's own specific clinical presentation and past medical history. All of their questions have been addressed.   Following the decision to proceed with transcatheter aortic valve replacement, a discussion was held regarding what types of management strategies would be attempted intraoperatively in the event of life-threatening complications, including whether or not the patient would be considered a candidate for the use of cardiopulmonary bypass and/or conversion to open sternotomy for attempted surgical intervention.  Since he has already had coronary bypass graft surgery and is 77 years old I do not think he would be a candidate for sternotomy in the emergency setting.  The patient has been advised of a variety of complications that might develop including but not limited to risks of death, stroke, paravalvular leak, aortic dissection or other major vascular complications, aortic annulus rupture, device embolization, cardiac rupture or perforation, mitral regurgitation, acute myocardial infarction, arrhythmia, heart block or bradycardia requiring permanent pacemaker placement, congestive heart failure, respiratory failure, renal failure, pneumonia, infection, other late complications related to structural valve deterioration or migration, or other complications that might ultimately cause a temporary or permanent loss of functional independence or other long term morbidity. The patient provides full informed consent for the procedure as described and all questions were answered.     Plan:  He will be scheduled for transapical transcatheter aortic valve replacement on Tuesday, 07/02/2018.   I spent 60  minutes performing this consultation and > 50% of this time was spent face to face counseling and coordinating the care of this patient's critical aortic stenosis.    [name], MD 12/25/2017

## 2018-06-28 ENCOUNTER — Other Ambulatory Visit: Payer: Self-pay

## 2018-06-28 ENCOUNTER — Encounter (HOSPITAL_COMMUNITY)
Admission: RE | Admit: 2018-06-28 | Discharge: 2018-06-28 | Disposition: A | Discharge status: Home / Self Care | Payer: Medicare Other | Source: Ambulatory Visit | Attending: Cardiovascular Disease | Admitting: Cardiovascular Disease

## 2018-06-28 ENCOUNTER — Ambulatory Visit (HOSPITAL_COMMUNITY)
Admission: RE | Admit: 2018-06-28 | Discharge: 2018-06-28 | Disposition: A | Discharge status: Home / Self Care | Payer: Medicare Other | Source: Ambulatory Visit | Attending: Cardiovascular Disease | Admitting: Cardiovascular Disease

## 2018-06-28 ENCOUNTER — Encounter (HOSPITAL_COMMUNITY): Payer: Self-pay

## 2018-06-28 DIAGNOSIS — I350 Nonrheumatic aortic (valve) stenosis: Secondary | ICD-10-CM

## 2018-06-28 DIAGNOSIS — J984 Other disorders of lung: Secondary | ICD-10-CM | POA: Diagnosis not present

## 2018-06-28 DIAGNOSIS — I447 Left bundle-branch block, unspecified: Secondary | ICD-10-CM | POA: Insufficient documentation

## 2018-06-28 DIAGNOSIS — R9431 Abnormal electrocardiogram [ECG] [EKG]: Secondary | ICD-10-CM | POA: Insufficient documentation

## 2018-06-28 DIAGNOSIS — Z01818 Encounter for other preprocedural examination: Secondary | ICD-10-CM | POA: Diagnosis not present

## 2018-06-28 HISTORY — DX: Pneumonia, unspecified organism: J18.9

## 2018-06-28 HISTORY — DX: Atherosclerotic heart disease of native coronary artery without angina pectoris: I25.10

## 2018-06-28 HISTORY — DX: Anemia, unspecified: D64.9

## 2018-06-28 LAB — URINALYSIS, ROUTINE W REFLEX MICROSCOPIC
BILIRUBIN URINE: NEGATIVE
GLUCOSE, UA: NEGATIVE mg/dL
HGB URINE DIPSTICK: NEGATIVE
KETONES UR: NEGATIVE mg/dL
LEUKOCYTES UA: NEGATIVE
Nitrite: NEGATIVE
Protein, ur: NEGATIVE mg/dL
Specific Gravity, Urine: 1.008 (ref 1.005–1.030)
pH: 7 (ref 5.0–8.0)

## 2018-06-28 LAB — BLOOD GAS, ARTERIAL
ACID-BASE EXCESS: 0.8 mmol/L (ref 0.0–2.0)
BICARBONATE: 25.1 mmol/L (ref 20.0–28.0)
Drawn by: 449841
FIO2: 21
O2 SAT: 93.6 %
PATIENT TEMPERATURE: 98.6
PO2 ART: 68.8 mmHg — AB (ref 83.0–108.0)
pCO2 arterial: 41.4 mmHg (ref 32.0–48.0)
pH, Arterial: 7.399 (ref 7.350–7.450)

## 2018-06-28 LAB — SURGICAL PCR SCREEN
MRSA, PCR: NEGATIVE
Staphylococcus aureus: NEGATIVE

## 2018-06-28 LAB — COMPREHENSIVE METABOLIC PANEL
ALT: 21 U/L (ref 0–44)
ANION GAP: 8 (ref 5–15)
AST: 18 U/L (ref 15–41)
Albumin: 4 g/dL (ref 3.5–5.0)
Alkaline Phosphatase: 72 U/L (ref 38–126)
BILIRUBIN TOTAL: 0.6 mg/dL (ref 0.3–1.2)
BUN: 16 mg/dL (ref 8–23)
CO2: 24 mmol/L (ref 22–32)
Calcium: 9.7 mg/dL (ref 8.9–10.3)
Chloride: 106 mmol/L (ref 98–111)
Creatinine, Ser: 1.08 mg/dL (ref 0.61–1.24)
GFR calc Af Amer: >60 mL/min (ref >60)
Glucose, Bld: 147 mg/dL — ABNORMAL HIGH (ref 70–99)
POTASSIUM: 4.4 mmol/L (ref 3.5–5.1)
Sodium: 138 mmol/L (ref 135–145)
TOTAL PROTEIN: 7.1 g/dL (ref 6.5–8.1)

## 2018-06-28 LAB — CBC
HEMATOCRIT: 35.6 % — AB (ref 39.0–52.0)
Hemoglobin: 10.8 g/dL — ABNORMAL LOW (ref 13.0–17.0)
MCH: 29.7 pg (ref 26.0–34.0)
MCHC: 30.3 g/dL (ref 30.0–36.0)
MCV: 97.8 fL (ref 80.0–100.0)
Platelets: 214 10*3/uL (ref 150–400)
RBC: 3.64 MIL/uL — ABNORMAL LOW (ref 4.22–5.81)
RDW: 12.9 % (ref 11.5–15.5)
WBC: 9.5 10*3/uL (ref 4.0–10.5)
nRBC: 0 % (ref 0.0–0.2)

## 2018-06-28 LAB — HEMOGLOBIN A1C
HEMOGLOBIN A1C: 6 % — AB (ref 4.8–5.6)
Mean Plasma Glucose: 125.5 mg/dL

## 2018-06-28 LAB — TYPE AND SCREEN
ABO/RH(D): O POS
Antibody Screen: NEGATIVE

## 2018-06-28 LAB — APTT: aPTT: 29 seconds (ref 24–36)

## 2018-06-28 LAB — ABO/RH: ABO/RH(D): O POS

## 2018-06-28 LAB — PROTIME-INR
INR: 1.03
Prothrombin Time: 13.4 seconds (ref 11.4–15.2)

## 2018-06-28 LAB — BRAIN NATRIURETIC PEPTIDE: B Natriuretic Peptide: 175.5 pg/mL — ABNORMAL HIGH (ref 0.0–100.0)

## 2018-06-28 NOTE — Progress Notes (Signed)
PCP - Kennith Maes MD Cardiologist - Dr. Burt Knack  Chest x-ray - 06/28/18 EKG - 06/28/18 ECHO - 04/2018 Cardiac Cath - 05/2018  Blood Thinner Instructions: N/A  Aspirin Instructions: will take up until DOS  Anesthesia review: yes, hx CAD  Patient denies shortness of breath, fever, cough and chest pain at PAT appointment   Patient verbalized understanding of instructions that were given to them at the PAT appointment. Patient was also instructed that they will need to review over the PAT instructions again at home before surgery.

## 2018-06-28 NOTE — Pre-Procedure Instructions (Signed)
Christopher Rocha  06/28/2018      [facility] [place], [address] [place] [address] Phone: [phone] Fax: [phone]    Your procedure is scheduled on Tuesday October 29th.  Report to [facility] at 0800 A.M.  Call this number if you have problems the morning of surgery:  [phone]   Remember:  Do not eat or drink after midnight.    Take these medicines the morning of surgery with A SIP OF WATER   None  7 days prior to surgery STOP taking any Aleve, Naproxen, Ibuprofen, Motrin, Advil, Goody's, BC's, all herbal medications, fish oil, and all vitamins      Do not wear jewelry, make-up or nail polish.  Do not wear lotions, powders, or perfumes, or deodorant.  Do not shave 48 hours prior to surgery.  Men may shave face and neck.  Do not bring valuables to the hospital.  [facility] is not responsible for any belongings or valuables.  Contacts, dentures or bridgework may not be worn into surgery.  Leave your suitcase in the car.  After surgery it may be brought to your room.  For patients admitted to the hospital, discharge time will be determined by your treatment team.  Patients discharged the day of surgery will not be allowed to drive home.    [facility]- Preparing For Surgery  Before surgery, you can play an important role. Because skin is not sterile, your skin needs to be as free of germs as possible. You can reduce the number of germs on your skin by washing with CHG (chlorahexidine gluconate) Soap before surgery.  CHG is an antiseptic cleaner which kills germs and bonds with the skin to continue killing germs even after washing.    Oral Hygiene is also important to reduce your risk of infection.  Remember - BRUSH YOUR TEETH THE MORNING OF SURGERY WITH YOUR REGULAR TOOTHPASTE  Please do not use if you have an allergy to CHG or antibacterial soaps. If your skin becomes  reddened/irritated stop using the CHG.  Do not shave (including legs and underarms) for at least 48 hours prior to first CHG shower. It is OK to shave your face.  Please follow these instructions carefully.   1. Shower the NIGHT BEFORE SURGERY and the MORNING OF SURGERY with CHG.   2. If you chose to wash your hair, wash your hair first as usual with your normal shampoo.  3. After you shampoo, rinse your hair and body thoroughly to remove the shampoo.  4. Use CHG as you would any other liquid soap. You can apply CHG directly to the skin and wash gently with a scrungie or a clean washcloth.   5. Apply the CHG Soap to your body ONLY FROM THE NECK DOWN.  Do not use on open wounds or open sores. Avoid contact with your eyes, ears, mouth and genitals (private parts). Wash Face and genitals (private parts)  with your normal soap.  6. Wash thoroughly, paying special attention to the area where your surgery will be performed.  7. Thoroughly rinse your body with warm water from the neck down.  8. DO NOT shower/wash with your normal soap after using and rinsing off the CHG Soap.  9. Pat yourself dry with a CLEAN TOWEL.  10. Wear CLEAN PAJAMAS to bed the night before surgery, wear comfortable clothes the morning of surgery  11. Place CLEAN SHEETS  on your bed the night of your first shower and DO NOT SLEEP WITH PETS.    Day of Surgery:  Do not apply any deodorants/lotions.  Please wear clean clothes to the hospital/surgery center.   Remember to brush your teeth WITH YOUR REGULAR TOOTHPASTE.    Please read over the following fact sheets that you were given.

## 2018-07-01 MED ORDER — INSULIN REGULAR(HUMAN) IN NACL 100-0.9 UT/100ML-% IV SOLN
INTRAVENOUS | Status: DC
  Filled 2018-07-01: qty 100

## 2018-07-01 MED ORDER — MAGNESIUM SULFATE 50 % IJ SOLN
40.0000 meq | INTRAMUSCULAR | Status: DC
  Filled 2018-07-01: qty 9.85

## 2018-07-01 MED ORDER — DEXMEDETOMIDINE HCL IN NACL 400 MCG/100ML IV SOLN
0.1000 ug/kg/h | INTRAVENOUS | Status: DC
  Filled 2018-07-01 (×2): qty 100

## 2018-07-01 MED ORDER — SODIUM CHLORIDE 0.9 % IV SOLN
INTRAVENOUS | Status: DC
  Filled 2018-07-01: qty 30

## 2018-07-01 MED ORDER — VANCOMYCIN HCL 10 G IV SOLR
1500.0000 mg | INTRAVENOUS | Status: AC
  Administered 2018-07-02: 1500 mg via INTRAVENOUS
  Filled 2018-07-01: qty 1500

## 2018-07-01 MED ORDER — PHENYLEPHRINE HCL-NACL 20-0.9 MG/250ML-% IV SOLN
30.0000 ug/min | INTRAVENOUS | Status: DC
  Filled 2018-07-01: qty 250

## 2018-07-01 MED ORDER — LEVOFLOXACIN IN D5W 500 MG/100ML IV SOLN
500.0000 mg | INTRAVENOUS | Status: AC
  Administered 2018-07-02: 500 mg via INTRAVENOUS
  Filled 2018-07-01: qty 100

## 2018-07-01 MED ORDER — NOREPINEPHRINE 4 MG/250ML-% IV SOLN
0.0000 ug/min | INTRAVENOUS | Status: AC
  Administered 2018-07-02: 2 ug/min via INTRAVENOUS
  Filled 2018-07-01: qty 250

## 2018-07-01 MED ORDER — NITROGLYCERIN IN D5W 200-5 MCG/ML-% IV SOLN
2.0000 ug/min | INTRAVENOUS | Status: DC
  Filled 2018-07-01: qty 250

## 2018-07-01 MED ORDER — EPINEPHRINE PF 1 MG/ML IJ SOLN
0.0000 ug/min | INTRAVENOUS | Status: DC
  Filled 2018-07-01: qty 4

## 2018-07-01 MED ORDER — POTASSIUM CHLORIDE 2 MEQ/ML IV SOLN
80.0000 meq | INTRAVENOUS | Status: DC
  Filled 2018-07-01: qty 40

## 2018-07-01 MED ORDER — DOPAMINE-DEXTROSE 3.2-5 MG/ML-% IV SOLN
0.0000 ug/kg/min | INTRAVENOUS | Status: DC
  Filled 2018-07-01: qty 250

## 2018-07-02 ENCOUNTER — Encounter (HOSPITAL_COMMUNITY): Payer: Self-pay | Admitting: Certified Registered"

## 2018-07-02 ENCOUNTER — Other Ambulatory Visit: Payer: Self-pay

## 2018-07-02 ENCOUNTER — Inpatient Hospital Stay (HOSPITAL_COMMUNITY): Payer: Medicare Other

## 2018-07-02 ENCOUNTER — Encounter (HOSPITAL_COMMUNITY)
Admission: RE | Disposition: A | Discharge status: Home / Self Care | Payer: Self-pay | Source: Ambulatory Visit | Attending: Surgery

## 2018-07-02 ENCOUNTER — Inpatient Hospital Stay (HOSPITAL_COMMUNITY)
Admission: RE | Admit: 2018-07-02 | Discharge: 2018-07-07 | DRG: 266 | Disposition: A | Discharge status: Home / Self Care | Payer: Medicare Other | Source: Ambulatory Visit | Attending: Surgery | Admitting: Surgery

## 2018-07-02 ENCOUNTER — Inpatient Hospital Stay (HOSPITAL_COMMUNITY): Payer: Medicare Other | Admitting: Physician Assistant

## 2018-07-02 ENCOUNTER — Inpatient Hospital Stay (HOSPITAL_COMMUNITY): Payer: Medicare Other | Admitting: Anesthesiology

## 2018-07-02 DIAGNOSIS — I272 Pulmonary hypertension, unspecified: Secondary | ICD-10-CM | POA: Diagnosis present

## 2018-07-02 DIAGNOSIS — I491 Atrial premature depolarization: Secondary | ICD-10-CM | POA: Diagnosis not present

## 2018-07-02 DIAGNOSIS — Z951 Presence of aortocoronary bypass graft: Secondary | ICD-10-CM | POA: Diagnosis not present

## 2018-07-02 DIAGNOSIS — E782 Mixed hyperlipidemia: Secondary | ICD-10-CM | POA: Diagnosis not present

## 2018-07-02 DIAGNOSIS — R918 Other nonspecific abnormal finding of lung field: Secondary | ICD-10-CM | POA: Diagnosis present

## 2018-07-02 DIAGNOSIS — Z87891 Personal history of nicotine dependence: Secondary | ICD-10-CM

## 2018-07-02 DIAGNOSIS — I10 Essential (primary) hypertension: Secondary | ICD-10-CM | POA: Diagnosis present

## 2018-07-02 DIAGNOSIS — I447 Left bundle-branch block, unspecified: Secondary | ICD-10-CM | POA: Diagnosis not present

## 2018-07-02 DIAGNOSIS — J440 Chronic obstructive pulmonary disease with acute lower respiratory infection: Secondary | ICD-10-CM | POA: Diagnosis not present

## 2018-07-02 DIAGNOSIS — R059 Cough, unspecified: Secondary | ICD-10-CM

## 2018-07-02 DIAGNOSIS — Z952 Presence of prosthetic heart valve: Secondary | ICD-10-CM

## 2018-07-02 DIAGNOSIS — I440 Atrioventricular block, first degree: Secondary | ICD-10-CM | POA: Diagnosis not present

## 2018-07-02 DIAGNOSIS — Z9889 Other specified postprocedural states: Secondary | ICD-10-CM

## 2018-07-02 DIAGNOSIS — J40 Bronchitis, not specified as acute or chronic: Secondary | ICD-10-CM | POA: Diagnosis not present

## 2018-07-02 DIAGNOSIS — D62 Acute posthemorrhagic anemia: Secondary | ICD-10-CM | POA: Diagnosis not present

## 2018-07-02 DIAGNOSIS — I2510 Atherosclerotic heart disease of native coronary artery without angina pectoris: Secondary | ICD-10-CM | POA: Diagnosis present

## 2018-07-02 DIAGNOSIS — R05 Cough: Secondary | ICD-10-CM

## 2018-07-02 DIAGNOSIS — I110 Hypertensive heart disease with heart failure: Secondary | ICD-10-CM | POA: Diagnosis not present

## 2018-07-02 DIAGNOSIS — T502X5A Adverse effect of carbonic-anhydrase inhibitors, benzothiadiazides and other diuretics, initial encounter: Secondary | ICD-10-CM | POA: Diagnosis not present

## 2018-07-02 DIAGNOSIS — Z7982 Long term (current) use of aspirin: Secondary | ICD-10-CM | POA: Diagnosis not present

## 2018-07-02 DIAGNOSIS — Z006 Encounter for examination for normal comparison and control in clinical research program: Secondary | ICD-10-CM | POA: Diagnosis not present

## 2018-07-02 DIAGNOSIS — I700 Atherosclerosis of aorta: Secondary | ICD-10-CM | POA: Diagnosis not present

## 2018-07-02 DIAGNOSIS — Z79899 Other long term (current) drug therapy: Secondary | ICD-10-CM

## 2018-07-02 DIAGNOSIS — Z880 Allergy status to penicillin: Secondary | ICD-10-CM | POA: Diagnosis not present

## 2018-07-02 DIAGNOSIS — Z9689 Presence of other specified functional implants: Secondary | ICD-10-CM

## 2018-07-02 DIAGNOSIS — Z954 Presence of other heart-valve replacement: Secondary | ICD-10-CM | POA: Diagnosis not present

## 2018-07-02 DIAGNOSIS — I4891 Unspecified atrial fibrillation: Secondary | ICD-10-CM | POA: Diagnosis not present

## 2018-07-02 DIAGNOSIS — I350 Nonrheumatic aortic (valve) stenosis: Principal | ICD-10-CM | POA: Diagnosis present

## 2018-07-02 DIAGNOSIS — I5033 Acute on chronic diastolic (congestive) heart failure: Secondary | ICD-10-CM

## 2018-07-02 DIAGNOSIS — I340 Nonrheumatic mitral (valve) insufficiency: Secondary | ICD-10-CM | POA: Diagnosis not present

## 2018-07-02 DIAGNOSIS — N179 Acute kidney failure, unspecified: Secondary | ICD-10-CM | POA: Diagnosis not present

## 2018-07-02 DIAGNOSIS — J449 Chronic obstructive pulmonary disease, unspecified: Secondary | ICD-10-CM | POA: Diagnosis present

## 2018-07-02 HISTORY — DX: Solitary pulmonary nodule: R91.1

## 2018-07-02 HISTORY — PX: TEE WITHOUT CARDIOVERSION: SHX5443

## 2018-07-02 HISTORY — DX: Presence of prosthetic heart valve: Z95.2

## 2018-07-02 HISTORY — PX: TRANSCATHETER AORTIC VALVE REPLACEMENT, TRANSAPICAL: SHX6401

## 2018-07-02 HISTORY — DX: Acute on chronic diastolic (congestive) heart failure: I50.33

## 2018-07-02 LAB — POCT I-STAT 4, (NA,K, GLUC, HGB,HCT)
GLUCOSE: 165 mg/dL — AB (ref 70–99)
HEMATOCRIT: 26 % — AB (ref 39.0–52.0)
HEMOGLOBIN: 8.8 g/dL — AB (ref 13.0–17.0)
POTASSIUM: 4.6 mmol/L (ref 3.5–5.1)
Sodium: 138 mmol/L (ref 135–145)

## 2018-07-02 LAB — FUNGUS CULTURE WITH STAIN

## 2018-07-02 LAB — FUNGAL ORGANISM REFLEX

## 2018-07-02 LAB — FUNGUS CULTURE RESULT

## 2018-07-02 SURGERY — REPLACEMENT, AORTIC VALVE, TRANSAPICAL APPROACH
Anesthesia: General | Site: Esophagus

## 2018-07-02 MED ORDER — ASPIRIN EC 81 MG PO TBEC
81.0000 mg | DELAYED_RELEASE_TABLET | Freq: Every day | ORAL | Status: DC
  Administered 2018-07-02 – 2018-07-07 (×6): 81 mg via ORAL
  Filled 2018-07-02 (×6): qty 1

## 2018-07-02 MED ORDER — PROTAMINE SULFATE 10 MG/ML IV SOLN
INTRAVENOUS | Status: AC
  Filled 2018-07-02: qty 25

## 2018-07-02 MED ORDER — MORPHINE SULFATE (PF) 2 MG/ML IV SOLN
2.0000 mg | INTRAVENOUS | Status: DC | PRN
  Administered 2018-07-02 (×2): 2 mg via INTRAVENOUS
  Administered 2018-07-02: 4 mg via INTRAVENOUS
  Filled 2018-07-02 (×3): qty 1
  Filled 2018-07-02: qty 2

## 2018-07-02 MED ORDER — ROCURONIUM BROMIDE 50 MG/5ML IV SOSY
PREFILLED_SYRINGE | INTRAVENOUS | Status: AC
  Filled 2018-07-02: qty 10

## 2018-07-02 MED ORDER — CLOPIDOGREL BISULFATE 75 MG PO TABS
75.0000 mg | ORAL_TABLET | Freq: Every day | ORAL | Status: DC
  Administered 2018-07-03 – 2018-07-07 (×5): 75 mg via ORAL
  Filled 2018-07-02 (×5): qty 1

## 2018-07-02 MED ORDER — FENTANYL CITRATE (PF) 100 MCG/2ML IJ SOLN
50.0000 ug | Freq: Once | INTRAMUSCULAR | Status: AC
  Administered 2018-07-02: 50 ug via INTRAVENOUS

## 2018-07-02 MED ORDER — ETOMIDATE 2 MG/ML IV SOLN
INTRAVENOUS | Status: AC
  Filled 2018-07-02: qty 10

## 2018-07-02 MED ORDER — ONDANSETRON HCL 4 MG/2ML IJ SOLN: 4.0000 mg | Freq: Four times a day (QID) | INTRAMUSCULAR | Status: DC | PRN

## 2018-07-02 MED ORDER — ONDANSETRON HCL 4 MG/2ML IJ SOLN
INTRAMUSCULAR | Status: AC
  Filled 2018-07-02: qty 4

## 2018-07-02 MED ORDER — ATORVASTATIN CALCIUM 80 MG PO TABS
80.0000 mg | ORAL_TABLET | Freq: Every day | ORAL | Status: DC
  Administered 2018-07-02 – 2018-07-07 (×6): 80 mg via ORAL
  Filled 2018-07-02 (×6): qty 1

## 2018-07-02 MED ORDER — FENTANYL CITRATE (PF) 250 MCG/5ML IJ SOLN
INTRAMUSCULAR | Status: DC | PRN
  Administered 2018-07-02: 100 ug via INTRAVENOUS
  Administered 2018-07-02 (×5): 50 ug via INTRAVENOUS

## 2018-07-02 MED ORDER — ACETAMINOPHEN 650 MG RE SUPP: 650.0000 mg | Freq: Four times a day (QID) | RECTAL | Status: DC | PRN

## 2018-07-02 MED ORDER — FENTANYL CITRATE (PF) 100 MCG/2ML IJ SOLN: 25.0000 ug | INTRAMUSCULAR | Status: DC | PRN

## 2018-07-02 MED ORDER — METOPROLOL TARTRATE 5 MG/5ML IV SOLN: 2.5000 mg | INTRAVENOUS | Status: DC | PRN

## 2018-07-02 MED ORDER — LOSARTAN POTASSIUM-HCTZ 100-12.5 MG PO TABS: 1.0000 | ORAL_TABLET | Freq: Every day | ORAL | Status: DC

## 2018-07-02 MED ORDER — CHLORHEXIDINE GLUCONATE 4 % EX LIQD: 60.0000 mL | Freq: Once | CUTANEOUS | Status: DC

## 2018-07-02 MED ORDER — SUGAMMADEX SODIUM 200 MG/2ML IV SOLN
INTRAVENOUS | Status: DC | PRN
  Administered 2018-07-02: 190 mg via INTRAVENOUS

## 2018-07-02 MED ORDER — SODIUM CHLORIDE 0.9 % IV SOLN: 250.0000 mL | INTRAVENOUS | Status: DC | PRN

## 2018-07-02 MED ORDER — PROPOFOL 1000 MG/100ML IV EMUL
INTRAVENOUS | Status: AC
  Filled 2018-07-02: qty 100

## 2018-07-02 MED ORDER — PHENYLEPHRINE 40 MCG/ML (10ML) SYRINGE FOR IV PUSH (FOR BLOOD PRESSURE SUPPORT)
PREFILLED_SYRINGE | INTRAVENOUS | Status: AC
  Filled 2018-07-02: qty 10

## 2018-07-02 MED ORDER — HEPARIN SODIUM (PORCINE) 1000 UNIT/ML IJ SOLN
INTRAMUSCULAR | Status: AC
  Filled 2018-07-02: qty 1

## 2018-07-02 MED ORDER — FENTANYL CITRATE (PF) 100 MCG/2ML IJ SOLN
INTRAMUSCULAR | Status: AC
  Administered 2018-07-02: 50 ug via INTRAVENOUS
  Filled 2018-07-02: qty 2

## 2018-07-02 MED ORDER — CHLORHEXIDINE GLUCONATE CLOTH 2 % EX PADS: 6.0000 | MEDICATED_PAD | Freq: Every day | CUTANEOUS | Status: DC

## 2018-07-02 MED ORDER — EPINEPHRINE PF 1 MG/ML IJ SOLN
INTRAMUSCULAR | Status: AC
  Filled 2018-07-02: qty 2

## 2018-07-02 MED ORDER — SODIUM CHLORIDE 0.9 % IV SOLN
INTRAVENOUS | Status: DC
  Administered 2018-07-02: 09:00:00 via INTRAVENOUS

## 2018-07-02 MED ORDER — ONDANSETRON HCL 4 MG/2ML IJ SOLN
INTRAMUSCULAR | Status: DC | PRN
  Administered 2018-07-02: 4 mg via INTRAVENOUS

## 2018-07-02 MED ORDER — PNEUMOCOCCAL VAC POLYVALENT 25 MCG/0.5ML IJ INJ: 0.5000 mL | INJECTION | INTRAMUSCULAR | Status: DC | PRN

## 2018-07-02 MED ORDER — HEPARIN SODIUM (PORCINE) 1000 UNIT/ML IJ SOLN
INTRAMUSCULAR | Status: DC | PRN
  Administered 2018-07-02: 10000 [IU] via INTRAVENOUS

## 2018-07-02 MED ORDER — SODIUM CHLORIDE 0.9 % IV SOLN
INTRAVENOUS | Status: AC
  Filled 2018-07-02 (×3): qty 1.2

## 2018-07-02 MED ORDER — FENTANYL CITRATE (PF) 250 MCG/5ML IJ SOLN
INTRAMUSCULAR | Status: AC
  Filled 2018-07-02: qty 5

## 2018-07-02 MED ORDER — DEXMEDETOMIDINE HCL IN NACL 200 MCG/50ML IV SOLN
INTRAVENOUS | Status: DC | PRN
  Administered 2018-07-02: 12 ug via INTRAVENOUS
  Administered 2018-07-02 (×3): 8 ug via INTRAVENOUS

## 2018-07-02 MED ORDER — PHENYLEPHRINE HCL-NACL 20-0.9 MG/250ML-% IV SOLN
0.0000 ug/min | INTRAVENOUS | Status: DC
  Filled 2018-07-02: qty 250

## 2018-07-02 MED ORDER — MIDAZOLAM HCL 2 MG/2ML IJ SOLN
INTRAMUSCULAR | Status: AC
  Filled 2018-07-02: qty 2

## 2018-07-02 MED ORDER — SODIUM CHLORIDE 0.9% FLUSH: 3.0000 mL | INTRAVENOUS | Status: DC | PRN

## 2018-07-02 MED ORDER — OXYCODONE HCL 5 MG/5ML PO SOLN: 5.0000 mg | Freq: Once | ORAL | Status: DC | PRN

## 2018-07-02 MED ORDER — SODIUM CHLORIDE 0.9% FLUSH: 10.0000 mL | INTRAVENOUS | Status: DC | PRN

## 2018-07-02 MED ORDER — HEPARIN SODIUM (PORCINE) 1000 UNIT/ML IJ SOLN
INTRAMUSCULAR | Status: AC
  Filled 2018-07-02: qty 2

## 2018-07-02 MED ORDER — SODIUM CHLORIDE 0.9 % IV SOLN
INTRAVENOUS | Status: DC | PRN
  Administered 2018-07-02: 15 ug/min via INTRAVENOUS

## 2018-07-02 MED ORDER — CHLORHEXIDINE GLUCONATE 4 % EX LIQD: 30.0000 mL | CUTANEOUS | Status: DC

## 2018-07-02 MED ORDER — TRAMADOL HCL 50 MG PO TABS: 50.0000 mg | ORAL_TABLET | ORAL | Status: DC | PRN

## 2018-07-02 MED ORDER — LABETALOL HCL 5 MG/ML IV SOLN
INTRAVENOUS | Status: AC
  Filled 2018-07-02: qty 4

## 2018-07-02 MED ORDER — LIDOCAINE HCL 1 % IJ SOLN
INTRAMUSCULAR | Status: AC
  Filled 2018-07-02: qty 20

## 2018-07-02 MED ORDER — VANCOMYCIN HCL IN DEXTROSE 1-5 GM/200ML-% IV SOLN
1000.0000 mg | Freq: Once | INTRAVENOUS | Status: AC
  Administered 2018-07-02: 1000 mg via INTRAVENOUS
  Filled 2018-07-02: qty 200

## 2018-07-02 MED ORDER — SODIUM CHLORIDE 0.9 % IV SOLN
INTRAVENOUS | Status: DC | PRN
  Administered 2018-07-02: 1500 mL

## 2018-07-02 MED ORDER — LOSARTAN POTASSIUM 50 MG PO TABS
100.0000 mg | ORAL_TABLET | Freq: Every day | ORAL | Status: DC
  Administered 2018-07-02 – 2018-07-03 (×2): 100 mg via ORAL
  Filled 2018-07-02 (×2): qty 2

## 2018-07-02 MED ORDER — LABETALOL HCL 5 MG/ML IV SOLN
INTRAVENOUS | Status: DC | PRN
  Administered 2018-07-02: 10 mg via INTRAVENOUS

## 2018-07-02 MED ORDER — ROCURONIUM BROMIDE 10 MG/ML (PF) SYRINGE
PREFILLED_SYRINGE | INTRAVENOUS | Status: DC | PRN
  Administered 2018-07-02: 50 mg via INTRAVENOUS

## 2018-07-02 MED ORDER — SODIUM CHLORIDE 0.9% FLUSH
3.0000 mL | Freq: Two times a day (BID) | INTRAVENOUS | Status: DC
  Administered 2018-07-02 – 2018-07-03 (×3): 3 mL via INTRAVENOUS

## 2018-07-02 MED ORDER — HYDROCHLOROTHIAZIDE 12.5 MG PO CAPS
12.5000 mg | ORAL_CAPSULE | Freq: Every day | ORAL | Status: DC
  Administered 2018-07-02 – 2018-07-03 (×2): 12.5 mg via ORAL
  Filled 2018-07-02 (×2): qty 1

## 2018-07-02 MED ORDER — PROTAMINE SULFATE 10 MG/ML IV SOLN
INTRAVENOUS | Status: DC | PRN
  Administered 2018-07-02: 90 mg via INTRAVENOUS
  Administered 2018-07-02: 10 mg via INTRAVENOUS

## 2018-07-02 MED ORDER — LEVOFLOXACIN IN D5W 750 MG/150ML IV SOLN
750.0000 mg | INTRAVENOUS | Status: AC
  Administered 2018-07-03: 750 mg via INTRAVENOUS
  Filled 2018-07-02: qty 150

## 2018-07-02 MED ORDER — OXYCODONE HCL 5 MG PO TABS
5.0000 mg | ORAL_TABLET | ORAL | Status: DC | PRN
  Administered 2018-07-02: 5 mg via ORAL
  Administered 2018-07-02 – 2018-07-03 (×2): 10 mg via ORAL
  Administered 2018-07-03: 5 mg via ORAL
  Filled 2018-07-02 (×2): qty 2
  Filled 2018-07-02 (×2): qty 1

## 2018-07-02 MED ORDER — OXYCODONE HCL 5 MG PO TABS: 5.0000 mg | ORAL_TABLET | Freq: Once | ORAL | Status: DC | PRN

## 2018-07-02 MED ORDER — SODIUM CHLORIDE 0.9% FLUSH
10.0000 mL | Freq: Two times a day (BID) | INTRAVENOUS | Status: DC
  Administered 2018-07-02: 10 mL
  Administered 2018-07-03: 30 mL
  Administered 2018-07-03: 10 mL

## 2018-07-02 MED ORDER — ACETAMINOPHEN 325 MG PO TABS
650.0000 mg | ORAL_TABLET | Freq: Four times a day (QID) | ORAL | Status: DC | PRN
  Filled 2018-07-02: qty 2

## 2018-07-02 MED ORDER — EPHEDRINE 5 MG/ML INJ
INTRAVENOUS | Status: AC
  Filled 2018-07-02: qty 10

## 2018-07-02 MED ORDER — NITROGLYCERIN IN D5W 200-5 MCG/ML-% IV SOLN: 0.0000 ug/min | INTRAVENOUS | Status: DC

## 2018-07-02 MED ORDER — CHLORHEXIDINE GLUCONATE 0.12 % MT SOLN
15.0000 mL | Freq: Once | OROMUCOSAL | Status: AC
  Administered 2018-07-02: 15 mL via OROMUCOSAL
  Filled 2018-07-02: qty 15

## 2018-07-02 MED ORDER — PROPOFOL 10 MG/ML IV BOLUS
INTRAVENOUS | Status: DC | PRN
  Administered 2018-07-02: 20 mg via INTRAVENOUS
  Administered 2018-07-02: 40 mg via INTRAVENOUS

## 2018-07-02 MED ORDER — HYDRALAZINE HCL 20 MG/ML IJ SOLN
10.0000 mg | INTRAMUSCULAR | Status: DC | PRN
  Administered 2018-07-02: 10 mg via INTRAVENOUS
  Filled 2018-07-02 (×2): qty 1

## 2018-07-02 MED ORDER — ONDANSETRON HCL 4 MG/2ML IJ SOLN: 4.0000 mg | Freq: Once | INTRAMUSCULAR | Status: DC | PRN

## 2018-07-02 MED ORDER — IODIXANOL 320 MG/ML IV SOLN
INTRAVENOUS | Status: DC | PRN
  Administered 2018-07-02: 40.1 mL via INTRA_ARTERIAL

## 2018-07-02 MED ORDER — SODIUM CHLORIDE 0.9 % IV SOLN
INTRAVENOUS | Status: AC
  Administered 2018-07-02: 13:00:00 via INTRAVENOUS

## 2018-07-02 MED ORDER — EPHEDRINE SULFATE-NACL 50-0.9 MG/10ML-% IV SOSY
PREFILLED_SYRINGE | INTRAVENOUS | Status: DC | PRN
  Administered 2018-07-02: 5 mg via INTRAVENOUS

## 2018-07-02 MED ORDER — LACTATED RINGERS IV SOLN
INTRAVENOUS | Status: DC | PRN
  Administered 2018-07-02: 10:00:00 via INTRAVENOUS

## 2018-07-02 SURGICAL SUPPLY — 110 items
ADAPTER UNIV SWAN GANZ BIP (ADAPTER) ×2 IMPLANT
ADAPTER UNV SWAN GANZ BIP (ADAPTER) ×2
ANTEGRADE CPLG (MISCELLANEOUS) IMPLANT
BAG DECANTER FOR FLEXI CONT (MISCELLANEOUS) ×4 IMPLANT
BAG SNAP BAND KOVER 36X36 (MISCELLANEOUS) ×12 IMPLANT
BLADE CLIPPER SURG (BLADE) ×4 IMPLANT
BLADE OSCILLATING /SAGITTAL (BLADE) IMPLANT
BLADE STERNUM SYSTEM 6 (BLADE) ×4 IMPLANT
CABLE ADAPT CONN TEMP 6FT (ADAPTER) ×4 IMPLANT
CANNULA FEM VENOUS REMOTE 22FR (CANNULA) IMPLANT
CANNULA OPTISITE PERFUSION 16F (CANNULA) IMPLANT
CANNULA OPTISITE PERFUSION 18F (CANNULA) IMPLANT
CANNULA SUMP PERICARDIAL (CANNULA) ×4 IMPLANT
CATH CROSS OVER TEMPO 5F (CATHETERS) ×8 IMPLANT
CATH DIAG EXPO 6F VENT PIG 145 (CATHETERS) ×4 IMPLANT
CATH S G BIP PACING (SET/KITS/TRAYS/PACK) ×4 IMPLANT
CLIP VESOCCLUDE MED 6/CT (CLIP) ×4 IMPLANT
CLIP VESOCCLUDE SM WIDE 6/CT (CLIP) ×4 IMPLANT
CONN ST 1/4X3/8  BEN (MISCELLANEOUS) ×2
CONN ST 1/4X3/8 BEN (MISCELLANEOUS) ×2 IMPLANT
CONT SPECI 4OZ STER CLIK (MISCELLANEOUS) ×4 IMPLANT
COVER BACK TABLE 24X17X13 BIG (DRAPES) ×4 IMPLANT
COVER DOME SNAP 22 D (MISCELLANEOUS) ×4 IMPLANT
COVER WAND RF STERILE (DRAPES) ×4 IMPLANT
CRADLE DONUT ADULT HEAD (MISCELLANEOUS) ×4 IMPLANT
DERMABOND ADVANCED (GAUZE/BANDAGES/DRESSINGS) ×2
DERMABOND ADVANCED .7 DNX12 (GAUZE/BANDAGES/DRESSINGS) ×2 IMPLANT
DRAIN CHANNEL 28F RND 3/8 FF (WOUND CARE) ×4 IMPLANT
DRAPE INCISE IOBAN 66X45 STRL (DRAPES) IMPLANT
DRAPE SLUSH/WARMER DISC (DRAPES) ×4 IMPLANT
DRAPE SURG IRRIG POUCH 19X23 (DRAPES) ×8 IMPLANT
ELECT BLADE 6.5 EXT (BLADE) IMPLANT
ELECT CAUTERY BLADE 6.4 (BLADE) ×4 IMPLANT
ELECT REM PT RETURN 9FT ADLT (ELECTROSURGICAL) ×8
ELECTRODE REM PT RTRN 9FT ADLT (ELECTROSURGICAL) ×4 IMPLANT
FELT TEFLON 6X6 (MISCELLANEOUS) ×4 IMPLANT
GAUZE SPONGE 4X4 12PLY STRL (GAUZE/BANDAGES/DRESSINGS) ×4 IMPLANT
GAUZE SPONGE 4X4 12PLY STRL LF (GAUZE/BANDAGES/DRESSINGS) ×4 IMPLANT
GLOVE BIO SURGEON STRL SZ7.5 (GLOVE) ×8 IMPLANT
GLOVE BIO SURGEON STRL SZ8 (GLOVE) ×8 IMPLANT
GLOVE EUDERMIC 7 POWDERFREE (GLOVE) ×8 IMPLANT
GLOVE ORTHO TXT STRL SZ7.5 (GLOVE) ×8 IMPLANT
GOWN STRL REUS W/ TWL LRG LVL3 (GOWN DISPOSABLE) ×6 IMPLANT
GOWN STRL REUS W/ TWL XL LVL3 (GOWN DISPOSABLE) ×12 IMPLANT
GOWN STRL REUS W/TWL LRG LVL3 (GOWN DISPOSABLE) ×6
GOWN STRL REUS W/TWL XL LVL3 (GOWN DISPOSABLE) ×12
GUIDEWIRE SAF TJ AMPL .035X180 (WIRE) ×8 IMPLANT
GUIDEWIRE STRAIGHT .035 260CM (WIRE) ×4 IMPLANT
GUIDEWIRE WHOLEY .035 145 JTIP (WIRE) ×4 IMPLANT
HEMOSTAT POWDER SURGIFOAM 1G (HEMOSTASIS) IMPLANT
INSERT FOGARTY SM (MISCELLANEOUS) ×4 IMPLANT
KIT DILATOR VASC 18G NDL (KITS) IMPLANT
KIT HEART LEFT (KITS) ×8 IMPLANT
KIT SUCTION CATH 14FR (SUCTIONS) ×8 IMPLANT
KIT TURNOVER KIT B (KITS) ×4 IMPLANT
LEAD PACING MYOCARDI (MISCELLANEOUS) ×4 IMPLANT
LOOP VESSEL MAXI BLUE (MISCELLANEOUS) IMPLANT
LOOP VESSEL MINI RED (MISCELLANEOUS) IMPLANT
NEEDLE 22X1 1/2 (OR ONLY) (NEEDLE) ×4 IMPLANT
NEEDLE PERC 18GX7CM (NEEDLE) ×4 IMPLANT
NS IRRIG 1000ML POUR BTL (IV SOLUTION) ×20 IMPLANT
PACK ENDOVASCULAR (PACKS) ×4 IMPLANT
PAD ARMBOARD 7.5X6 YLW CONV (MISCELLANEOUS) ×8 IMPLANT
PAD ELECT DEFIB RADIOL ZOLL (MISCELLANEOUS) ×4 IMPLANT
PENCIL BUTTON HOLSTER BLD 10FT (ELECTRODE) ×4 IMPLANT
RETRACTOR TRL SOFT TISSUE LG (INSTRUMENTS) IMPLANT
RETRACTOR TRM SOFT TISSUE 7.5 (INSTRUMENTS) IMPLANT
SHEATH AVANTI 11CM 8FR (SHEATH) ×4 IMPLANT
SHEATH BRITE TIP 6FR 35CM (SHEATH) ×4 IMPLANT
SHEATH PINNACLE 6F 10CM (SHEATH) ×8 IMPLANT
SLEEVE REPOSITIONING LENGTH 30 (MISCELLANEOUS) ×4 IMPLANT
SPONGE LAP 4X18 RFD (DISPOSABLE) ×4 IMPLANT
STOPCOCK MORSE 400PSI 3WAY (MISCELLANEOUS) ×4 IMPLANT
SUT BONE WAX W31G (SUTURE) ×4 IMPLANT
SUT ETHIBOND X763 2 0 SH 1 (SUTURE) ×8 IMPLANT
SUT PROLENE 2 0 MH 48 (SUTURE) ×12 IMPLANT
SUT PROLENE 4 0 RB 1 (SUTURE) ×2
SUT PROLENE 4-0 RB1 .5 CRCL 36 (SUTURE) ×2 IMPLANT
SUT SILK  1 MH (SUTURE) ×2
SUT SILK 1 MH (SUTURE) ×2 IMPLANT
SUT SILK 2 0 (SUTURE) ×2
SUT SILK 2 0 SH CR/8 (SUTURE) ×4 IMPLANT
SUT SILK 2-0 18XBRD TIE 12 (SUTURE) ×2 IMPLANT
SUT SILK 4 0 (SUTURE) ×2
SUT SILK 4-0 18XBRD TIE 12 (SUTURE) ×2 IMPLANT
SUT VIC AB 1 CTX 36 (SUTURE) ×2
SUT VIC AB 1 CTX36XBRD ANBCTR (SUTURE) ×2 IMPLANT
SUT VIC AB 2-0 CTX 36 (SUTURE) ×8 IMPLANT
SUT VIC AB 3-0 X1 27 (SUTURE) ×8 IMPLANT
SUT VICRYL 2 TP 1 (SUTURE) IMPLANT
SYR 3ML LL SCALE MARK (SYRINGE) ×4 IMPLANT
SYR 50ML LL SCALE MARK (SYRINGE) ×4 IMPLANT
SYR 5ML LL (SYRINGE) ×4 IMPLANT
SYR BULB IRRIGATION 50ML (SYRINGE) ×4 IMPLANT
SYRINGE 60CC LL (MISCELLANEOUS) ×3 IMPLANT
SYSTEM SAHARA CHEST DRAIN ATS (WOUND CARE) ×4 IMPLANT
TAPE CLOTH SURG 4X10 WHT LF (GAUZE/BANDAGES/DRESSINGS) ×4 IMPLANT
TOWEL GREEN STERILE (TOWEL DISPOSABLE) ×4 IMPLANT
TOWEL GREEN STERILE FF (TOWEL DISPOSABLE) ×4 IMPLANT
TOWEL OR NON WOVEN STRL DISP B (DISPOSABLE) ×4 IMPLANT
TRANSDUCER DISP STR W/STOPCOCK (MISCELLANEOUS) ×8 IMPLANT
TRANSDUCER W/STOPCOCK (MISCELLANEOUS) ×8 IMPLANT
TRAY FOLEY SLVR 16FR TEMP STAT (SET/KITS/TRAYS/PACK) ×4 IMPLANT
TUBING ART PRESS 72  MALE/FEM (TUBING) ×2
TUBING ART PRESS 72 MALE/FEM (TUBING) ×2 IMPLANT
TUBING HIGH PRESSURE 120CM (CONNECTOR) ×4 IMPLANT
VALVE HRT TRANSCATH CERT 29MM (Valve) ×4 IMPLANT
WIRE BENTSON .035X145CM (WIRE) ×4 IMPLANT
WIRE EMERALD 3MM-J .035X150CM (WIRE) ×4 IMPLANT
YANKAUER SUCT BULB TIP NO VENT (SUCTIONS) ×4 IMPLANT

## 2018-07-02 NOTE — Interval H&P Note (Signed)
History and Physical Interval Note:  07/02/2018 9:35 AM  Christopher Rocha  has presented today for surgery, with the diagnosis of Severe Aortic Stenosis  The various methods of treatment have been discussed with the patient and family. After consideration of risks, benefits and other options for treatment, the patient has consented to  Procedure(s): TRANSCATHETER AORTIC VALVE REPLACEMENT, TRANSAPICAL (N/A) INTRAOPERATIVE TRANSTHORACIC ECHOCARDIOGRAM as a surgical intervention .  The patient's history has been reviewed, patient examined, no change in status, stable for surgery.  I have reviewed the patient's chart and labs.  Questions were answered to the patient's satisfaction.     Shortness of breath: Yes.   If yes: with what activity?: moderate activity Worse than previously noted?: No.  New edema, PND, orthopnea: No.  Recent decrease in activity i.e. more difficulty walking to mailbox, climbing stairs, etc: Yes.    Changes in sleeping i.e. need to utilize to sleep on more pillows, sitting up, etc: No.  Changes since last seen in pre-op visit: No.   BNP 175.5  [name]

## 2018-07-02 NOTE — Anesthesia Preprocedure Evaluation (Addendum)
Anesthesia Evaluation  Patient identified by MRN, date of birth, ID band Patient awake    Reviewed: Allergy & Precautions, NPO status , Patient's Chart, lab work & pertinent test results  History of Anesthesia Complications Negative for: history of anesthetic complications  Airway Mallampati: III  TM Distance: >3 FB Neck ROM: Full    Dental  (+) Edentulous Lower, Edentulous Upper   Pulmonary COPD, former smoker,   RUL mass    breath sounds clear to auscultation       Cardiovascular hypertension, Pt. on medications + CAD and + CABG  + Valvular Problems/Murmurs AS  Rhythm:Regular Rate:Normal + Systolic murmurs  '19 Carotid US - 1-39% b/l ICAS  '19 Cath - Prox RCA lesion is 50% stenosed. SVG graft was visualized by angiography and is normal in caliber. The graft exhibits no disease. Ost Cx to Prox Cx lesion is 100% stenosed. Mid LM to Dist LM lesion is 90% stenosed. Prox LAD lesion is 100% stenosed. LIMA graft was visualized by angiography and is normal in caliber. The graft exhibits no disease.  1.  Significant left main and two-vessel coronary artery disease involving the LAD and left circumflex.  Patent LIMA to LAD and SVG to OM1 supplying the whole left circumflex distribution.  Moderate proximal RCA stenosis with no obstructive disease. 2.  Right heart catheterization showed mildly elevated left-sided filling pressures, mild pulmonary hypertension and normal cardiac output.  Prominent V wave on pulmonary capillary wedge pressure tracing suggestive of mitral regurgitation 3.  Severe aortic stenosis by echocardiogram.  I did not attempt to cross the aortic valve.  '19 TTE - EF 60% to 65%. Grade 2 diastolic dysfunction. Critical AS with trivial AI. Valve area (VTI): 0.92 cm^2. Valve area (Vmax): 1.02 cm^2. Valve area (Vmean): 0.99 cm^2. Mild MR. Valve area by pressure half-time: 2 cm^2. Valve area by continuity equation  (using LVOT flow): 2.05 cm^2. LA was moderately dilated. PA peak pressure: 38 mm Hg    Neuro/Psych negative neurological ROS  negative psych ROS   GI/Hepatic negative GI ROS, Neg liver ROS,   Endo/Other  negative endocrine ROS  Renal/GU negative Renal ROS     Musculoskeletal negative musculoskeletal ROS (+)   Abdominal   Peds  Hematology  (+) anemia ,   Anesthesia Other Findings   Reproductive/Obstetrics                            Anesthesia Physical Anesthesia Plan  ASA: IV  Anesthesia Plan: General   Post-op Pain Management:    Induction: Intravenous  PONV Risk Score and Plan: 3 and Treatment may vary due to age or medical condition, Ondansetron and Propofol infusion  Airway Management Planned: Oral ETT  Additional Equipment: Arterial line, CVP and Ultrasound Guidance Line Placement  Intra-op Plan:   Post-operative Plan: Possible Post-op intubation/ventilation  Informed Consent: I have reviewed the patients History and Physical, chart, labs and discussed the procedure including the risks, benefits and alternatives for the proposed anesthesia with the patient or authorized representative who has indicated his/her understanding and acceptance.   Dental advisory given  Plan Discussed with: CRNA and Anesthesiologist  Anesthesia Plan Comments:        Anesthesia Quick Evaluation

## 2018-07-02 NOTE — Progress Notes (Signed)
Patient ID: Christopher Rocha, male   DOB: 08/01/1941, 77 y.o.   MRN: [id number] [facility] Evening Rounds  Hemodynamically stable. DC arterial line. Atrial fib with rate 80's Complains of surgical pain. Chest tube output low. Urine output ok. Complains of discomfort from foley so will remove it.  Right groin sites ok.

## 2018-07-02 NOTE — Anesthesia Postprocedure Evaluation (Signed)
Anesthesia Post Note  Patient: Christopher Rocha  Procedure(s) Performed: TRANSCATHETER AORTIC VALVE REPLACEMENT, TRANSAPICAL (N/A Chest) TRANSESOPHAGEAL ECHOCARDIOGRAM (TEE) (Esophagus)     Patient location during evaluation: PACU Anesthesia Type: General Level of consciousness: awake and alert Pain management: satisfactory to patient Vital Signs Assessment: post-procedure vital signs reviewed and stable Respiratory status: spontaneous breathing, nonlabored ventilation, respiratory function stable and patient connected to nasal cannula oxygen Cardiovascular status: blood pressure returned to baseline and stable Postop Assessment: no apparent nausea or vomiting Anesthetic complications: no    Last Vitals:  Vitals:   07/02/18 1630 07/02/18 1700  BP: 124/65   Pulse: 82 94  Resp: (!) 24 (!) 33  Temp:    SpO2: 90% 95%                 Audry Pili

## 2018-07-02 NOTE — H&P (Signed)
[address]       [place] [address]             [phone]      Cardiothoracic Surgery Admission History and Physical   Referring Provider is Wellington Hampshire, MD  PCP is [name], MD      Chief Complaint  Patient presents with  . Aortic Stenosis       HPI:  The patient is a 77 year old gentleman with a history of hypertension, hyperlipidemia, and coronary artery disease who underwent coronary bypass graft surgery x2 by Dr. Jerelene Redden at [facility] in 2014. He has done well since his surgery and has maintained an active lifestyle. Over the past 6 months he has developed mild exertional shortness of breath and fatigue. He recently reestablished cardiology care by Dr. Geraldo Pitter and had a follow-up echocardiogram due to history of heart murmur which showed severe aortic stenosis with a mean gradient of 60 mmHg and a peak gradient of 107 mmHg. The peak velocity ratio was 0.3 and the aortic valve area was 0.99 cm. Left ventricular ejection fraction was 60 to 65% with grade 2 diastolic dysfunction. He underwent right and left heart catheterization on 05/08/2018 which showed significant left main and two-vessel coronary disease with a patent LIMA graft to the LAD and a patent saphenous vein graft to the obtuse marginal 1. The right coronary artery had moderate nonobstructive disease.  The patient is here with his wife today. He says that he does feel fairly well but has some exertional shortness of breath and fatigue and just does not do as much as he used to a year ago. He has had no chest pain or pressure. He occasionally has shortness of breath laying flat. He has had no dizziness or syncope. He denies peripheral edema.      Past Medical History:  Diagnosis Date  . HLD (hyperlipidemia)   . HTN (hypertension)   . S/P CABG (coronary artery bypass graft)   . Severe aortic stenosis         Past Surgical History:  Procedure Laterality Date    . CARDIAC SURGERY    . RIGHT/LEFT HEART CATH AND CORONARY/GRAFT ANGIOGRAPHY N/A 05/08/2018   Procedure: RIGHT/LEFT HEART CATH AND CORONARY/GRAFT ANGIOGRAPHY; Surgeon: Wellington Hampshire, MD; Location: [facility]; Service: Cardiovascular; Laterality: N/A;  . VIDEO BRONCHOSCOPY Bilateral 05/31/2018   Procedure: VIDEO BRONCHOSCOPY WITH FLUORO; Surgeon: Rigoberto Noel, MD; Location: [facility]; Service: Cardiopulmonary; Laterality: Bilateral;        Family History  Problem Relation Age of Onset  . Diabetes Mother   . Clotting disorder Father    Social History        Socioeconomic History  . Marital status: Married    Spouse name: Not on file  . Number of children: Not on file  . Years of education: Not on file  . Highest education level: Not on file  Occupational History  . Not on file  Social Needs  . Financial resource strain: Not on file  . Food insecurity:    Worry: Not on file    Inability: Not on file  . Transportation needs:    Medical: Not on file    Non-medical: Not on file  Tobacco Use  . Smoking status: Former Research scientist (life sciences)  . Smokeless tobacco: Never Used  Substance and Sexual Activity  . Alcohol use: Not on file  . Drug use: Not on  file  . Sexual activity: Not on file  Lifestyle  . Physical activity:    Days per week: Not on file    Minutes per session: Not on file  . Stress: Not on file  Relationships  . Social connections:    Talks on phone: Not on file    Gets together: Not on file    Attends religious service: Not on file    Active member of club or organization: Not on file    Attends meetings of clubs or organizations: Not on file    Relationship status: Not on file  . Intimate partner violence:    Fear of current or ex partner: Not on file    Emotionally abused: Not on file    Physically abused: Not on file    Forced sexual activity: Not on file  Other Topics Concern  . Not on file  Social History Narrative  . Not on file         Current  Outpatient Medications  Medication Sig Dispense Refill  . aspirin EC 81 MG tablet Take 81 mg by mouth every other day.     Marland Kitchen atorvastatin (LIPITOR) 80 MG tablet Take 80 mg by mouth daily.     Marland Kitchen ibuprofen (ADVIL,MOTRIN) 200 MG tablet Take 400 mg by mouth every 6 (six) hours as needed for headache or moderate pain.    Marland Kitchen losartan-hydrochlorothiazide (HYZAAR) 100-12.5 MG tablet Take 1 tablet by mouth daily.    . Naphazoline-Glycerin (REDNESS RELIEF OP) Place 1 drop into both eyes daily as needed (for redness/itching).    . nitroGLYCERIN (NITROSTAT) 0.4 MG SL tablet Place 0.4 mg under the tongue every 5 (five) minutes as needed for chest pain.     No current facility-administered medications for this visit.         Allergies  Allergen Reactions  . Penicillins Hives and Other (See Comments)    Has patient had a PCN reaction causing immediate rash, facial/tongue/throat swelling, SOB or lightheadedness with hypotension: No  Has patient had a PCN reaction causing severe rash involving mucus membranes or skin necrosis: No  Has patient had a PCN reaction that required hospitalization: No  Has patient had a PCN reaction occurring within the last 10 years: No  If all of the above answers are "NO", then may proceed with Cephalosporin use.    Review of Systems:  General: normal appetite, decreased energy, no weight gain, no weight loss, no fever  Cardiac: no chest pain with exertion, no chest pain at rest, +SOB with exertion, occasional resting SOB, no PND, occasional orthopnea, no palpitations, no arrhythmia, no atrial fibrillation, no LE edema, no dizzy spells, no syncope  Respiratory: + shortness of breath, no home oxygen, + productive cough, no dry cough, no bronchitis, no wheezing, no hemoptysis, no asthma, no pain with inspiration or cough, no sleep apnea, no CPAP at night  GI: no difficulty swallowing, no reflux, no frequent heartburn, no hiatal hernia, no abdominal pain, no constipation, no  diarrhea, no hematochezia, no hematemesis, no melena  GU: no dysuria, no frequency, no urinary tract infection, no hematuria, no enlarged prostate, no kidney stones, no kidney disease  Vascular: + pain suggestive of claudication, no pain in feet, + leg cramps, no varicose veins, no DVT, no non-healing foot ulcer  Neuro: no stroke, no TIA's, no seizures, no headaches, no temporary blindness one eye, no slurred speech, no peripheral neuropathy, no chronic pain, no instability of gait, no memory/cognitive dysfunction  Musculoskeletal: +  arthritis, no joint swelling, no myalgias, no difficulty walking, normal mobility  Skin: no rash, no itching, no skin infections, no pressure sores or ulcerations  Psych: no anxiety, no depression, no nervousness, + unusual recent stress  Eyes: no blurry vision, no floaters, no recent vision changes, + wears glasses or contacts  ENT: no hearing loss, no loose or painful teeth, full dentures  Hematologic: + easy bruising, no abnormal bleeding, no clotting disorder, no frequent epistaxis  Endocrine: no diabetes, does not check CBG's at home    Physical Exam:   BP (!) 155/76  Pulse 62  Resp 20  Ht _0  (1.803 m)  Wt 202 lb (91.6 kg)  SpO2 99% Comment: RA  BMI 28.17 kg/m  General: Elderly but well-appearing  HEENT: Unremarkable, NCAT, PERLA, EOMI, oropharynx clear  Neck: no JVD, no bruits, no adenopathy or thyromegaly  Chest: clear to auscultation, symmetrical breath sounds, no wheezes, no rhonchi  CV: RRR, grade III/VI crescendo/decrescendo murmur heard best at RSB, no diastolic murmur  Abdomen: soft, non-tender, no masses or organomegaly  Extremities: warm, well-perfused, pulses not palpable, no LE edema  Rectal/GU Deferred  Neuro: Grossly non-focal and symmetrical throughout  Skin: Clean and dry, no rashes, no breakdown    STS Risk Calculator:   Risk of Mortality: 2.192% Renal Failure: 2.499% Permanent Stroke: 1.425% Prolonged Ventilation: 7.542%  DSW Infection: 0.216% Reoperation: 3.482% Morbidity or Mortality: 11.120% Short Length of Stay: 37.252% Long Length of Stay: 6.742%    Diagnostic Tests:   *[facility] [place]  [place], [place] [address]  [phone]  -------------------------------------------------------------------  Transthoracic Echocardiography  Patient: Codey, Burling  MR #: [id number]  Study Date: 04/29/2018  Gender: M  Age: 77  Height: 180.3 cm  Weight: 89.8 kg  BSA: 2.14 m^2  Pt. Status:  Room:  ATTENDING Revankar, Les Pou  ORDERING Jyl Heinz, MD  REFERRING Jyl Heinz, MD  SONOGRAPHER Joanie Coddington, RDCS  PERFORMING [facility], Achille  cc:  -------------------------------------------------------------------  LV EF: 60% - 65%  -------------------------------------------------------------------  Indications: Dyspnea 786.09. Murmur 785.2.  -------------------------------------------------------------------  History: PMH: Coronary artery disease. Risk factors:  Hypertension. Dyslipidemia.  -------------------------------------------------------------------  Study Conclusions  - Left ventricle: The cavity size was normal. Wall thickness was  normal. Systolic function was normal. The estimated ejection  fraction was in the range of 60% to 65%. Wall motion was normal;  there were no regional wall motion abnormalities. Features are  consistent with a pseudonormal left ventricular filling pattern,  with concomitant abnormal relaxation and increased filling  pressure (grade 2 diastolic dysfunction). Doppler parameters are  consistent with high ventricular filling pressure.  - Aortic valve: Trileaflet; severely thickened, moderately  calcified leaflets. Cusp separation was severely reduced. There  was critical stenosis. There was trivial regurgitation. Valve  area (VTI): 0.92 cm^2. Valve area (Vmax): 1.02 cm^2. Valve area  (Vmean): 0.99 cm^2.  - Mitral valve: Moderately  calcified annulus. Mildly thickened  leaflets . There was mild regurgitation. Valve area by pressure  half-time: 2 cm^2. Valve area by continuity equation (using LVOT  flow): 2.05 cm^2.  - Left atrium: The atrium was moderately dilated.  - Pulmonary arteries: PA peak pressure: 38 mm Hg (S).  -------------------------------------------------------------------  Labs, prior tests, procedures, and surgery:  Coronary artery bypass grafting.  -------------------------------------------------------------------  Study data: No prior study was available for comparison. Study  status: Routine. Procedure: Frequent APC&'s are noted. The  patient reported no pain pre or post test. Transthoracic  echocardiography. Image quality was suboptimal. The study was  technically difficult, as a result of poor sound wave transmission.  Intravenous contrast (Definity) was administered. Study  completion: There were no complications. Transthoracic  echocardiography. M-mode, complete 2D, spectral Doppler, and color  Doppler. Birthdate: Patient birthdate: 1941-03-07. Age: Patient  is 77 yr old. Sex: Gender: male. BMI: 27.6 kg/m^2. Blood  pressure: 138/70 Patient status: Outpatient. Study date:  Study date: 04/29/2018. Study time: 11:04 AM. Location: Echo  laboratory.  -------------------------------------------------------------------  -------------------------------------------------------------------  Left ventricle: The cavity size was normal. Wall thickness was  normal. Systolic function was normal. The estimated ejection  fraction was in the range of 60% to 65%. Wall motion was normal;  there were no regional wall motion abnormalities. Features are  consistent with a pseudonormal left ventricular filling pattern,  with concomitant abnormal relaxation and increased filling pressure  (grade 2 diastolic dysfunction). Doppler parameters are consistent  with high ventricular filling pressure.    -------------------------------------------------------------------  Aortic valve: Trileaflet; severely thickened, moderately  calcified leaflets. Cusp separation was severely reduced. Doppler:  There was critical stenosis. There was trivial regurgitation.  VTI ratio of LVOT to aortic valve: 0.27. Valve area (VTI): 0.92  cm^2. Indexed valve area (VTI): 0.43 cm^2/m^2. Peak velocity ratio  of LVOT to aortic valve: 0.3. Valve area (Vmax): 1.02 cm^2. Indexed  valve area (Vmax): 0.48 cm^2/m^2. Mean velocity ratio of LVOT to  aortic valve: 0.29. Valve area (Vmean): 0.99 cm^2. Indexed valve  area (Vmean): 0.46 cm^2/m^2. Mean gradient (S): 60 mm Hg. Peak  gradient (S): 107 mm Hg.  -------------------------------------------------------------------  Aorta: The aorta was normal, not dilated, and non-diseased. Aortic  root: The aortic root was normal in size.  -------------------------------------------------------------------  Mitral valve: Moderately calcified annulus. Mildly thickened  leaflets . Mobility was not restricted. Doppler: Transvalvular  velocity was within the normal range. There was no evidence for  stenosis. There was mild regurgitation. Valve area by pressure  half-time: 2 cm^2. Indexed valve area by pressure half-time: 0.94  cm^2/m^2. Valve area by continuity equation (using LVOT flow): 2.05  cm^2. Indexed valve area by continuity equation (using LVOT flow):  0.96 cm^2/m^2. Mean gradient (D): 4 mm Hg. Peak gradient (D): 7  mm Hg.  -------------------------------------------------------------------  Left atrium: The atrium was moderately dilated.  -------------------------------------------------------------------  Atrial septum: The septum was normal.  -------------------------------------------------------------------  Right ventricle: The cavity size was normal. Wall thickness was  normal. Systolic function was normal.   -------------------------------------------------------------------  Pulmonic valve: The valve appears to be grossly normal.  Doppler: Transvalvular velocity was within the normal range. There  was no evidence for stenosis.  -------------------------------------------------------------------  Tricuspid valve: Structurally normal valve. Doppler:  Transvalvular velocity was within the normal range. There was  trivial regurgitation.  -------------------------------------------------------------------  Pulmonary artery: The main pulmonary artery was normal-sized.  Systolic pressure was within the normal range.  -------------------------------------------------------------------  Right atrium: The atrium was normal in size.  -------------------------------------------------------------------  Pericardium: The pericardium was normal in appearance. There was  no pericardial effusion.  -------------------------------------------------------------------  Systemic veins:  Inferior vena cava: The vessel was normal in size. The  respirophasic diameter changes were in the normal range (>= 50%),  consistent with normal central venous pressure.  -------------------------------------------------------------------  Post procedure conclusions  Ascending Aorta:  - The aorta was normal, not dilated, and non-diseased.  -------------------------------------------------------------------  Measurements  Left ventricle Value Reference  LV ID, ED, PLAX chordal 47 mm 43 - 52  LV ID, ES, PLAX chordal 34 mm 23 -  38  LV fx shortening, PLAX chordal (L) 28 % >=29  LV PW thickness, ED 12 mm ----------  IVS/LV PW ratio, ED 1.08 <=1.3  Stroke volume, 2D 127 ml ----------  Stroke volume/bsa, 2D 59 ml/m^2 ----------  LV e&', lateral 9.36 cm/s ----------  LV E/e&', lateral 14.21 ----------  LV e&', medial 8.99 cm/s ----------  LV E/e&', medial 14.79 ----------  LV e&', average 9.18 cm/s ----------  LV E/e&',  average 14.5 ----------  Ventricular septum Value Reference  IVS thickness, ED 13 mm ----------  LVOT Value Reference  LVOT ID, S 21 mm ----------  LVOT area 3.46 cm^2 ----------  LVOT peak velocity, S 153 cm/s ----------  LVOT mean velocity, S 103 cm/s ----------  LVOT VTI, S 36.8 cm ----------  LVOT peak gradient, S 9 mm Hg ----------  Aortic valve Value Reference  Aortic valve peak velocity, S 517 cm/s ----------  Aortic valve mean velocity, S 360 cm/s ----------  Aortic valve VTI, S 138 cm ----------  Aortic mean gradient, S 60 mm Hg ----------  Aortic peak gradient, S 107 mm Hg ----------  VTI ratio, LVOT/AV 0.27 ----------  Aortic valve area, VTI 0.92 cm^2 ----------  Aortic valve area/bsa, VTI 0.43 cm^2/m^2 ----------  Velocity ratio, peak, LVOT/AV 0.3 ----------  Aortic valve area, peak velocity 1.02 cm^2 ----------  Aortic valve area/bsa, peak 0.48 cm^2/m^2 ----------  velocity  Velocity ratio, mean, LVOT/AV 0.29 ----------  Aortic valve area, mean velocity 0.99 cm^2 ----------  Aortic valve area/bsa, mean 0.46 cm^2/m^2 ----------  velocity  Aorta Value Reference  Aortic root ID, ED 31 mm ----------  Ascending aorta ID, A-P, S 32 mm ----------  Left atrium Value Reference  LA ID, A-P, ES 49 mm ----------  LA ID/bsa, A-P (H) 2.29 cm/m^2 <=2.2  LA volume, S 115 ml ----------  LA volume/bsa, S 53.8 ml/m^2 ----------  LA volume, ES, 1-p A4C 82.5 ml ----------  LA volume/bsa, ES, 1-p A4C 38.6 ml/m^2 ----------  LA volume, ES, 1-p A2C 148 ml ----------  LA volume/bsa, ES, 1-p A2C 69.3 ml/m^2 ----------  Mitral valve Value Reference  Mitral E-wave peak velocity 133 cm/s ----------  Mitral A-wave peak velocity 94.5 cm/s ----------  Mitral mean velocity, D 87.2 cm/s ----------  Mitral deceleration time (H) 375 ms 150 - 230  Mitral pressure half-time 110 ms ----------  Mitral mean gradient, D 4 mm Hg ----------  Mitral peak gradient, D 7 mm Hg ----------  Mitral E/A  ratio, peak 1.4 ----------  Mitral valve area, PHT, DP 2 cm^2 ----------  Mitral valve area/bsa, PHT, DP 0.94 cm^2/m^2 ----------  Mitral valve area, LVOT 2.05 cm^2 ----------  continuity  Mitral valve area/bsa, LVOT 0.96 cm^2/m^2 ----------  continuity  Mitral annulus VTI, D 62 cm ----------  Pulmonary arteries Value Reference  PA pressure, S, DP (H) 38 mm Hg <=30  Tricuspid valve Value Reference  Tricuspid regurg peak velocity 274 cm/s ----------  Tricuspid peak RV-RA gradient 30 mm Hg ----------  Right atrium Value Reference  RA ID, S-I, ES, A4C (H) 64.7 mm 34 - 49  RA area, ES, A4C 17.7 cm^2 8.3 - 19.5  RA volume, ES, A/L 42.8 ml ----------  RA volume/bsa, ES, A/L 20 ml/m^2 ----------  Systemic veins Value Reference  Estimated CVP 8 mm Hg ----------  Right ventricle Value Reference  TAPSE 19.9 mm ----------  RV pressure, S, DP (H) 38 mm Hg <=30  RV s&', lateral, S 8.85 cm/s ----------  Legend:  (L) and (H)  mark values outside specified reference range.  -------------------------------------------------------------------  Prepared and Electronically Authenticated by  Shirlee More, MD  2019-08-26T17:29:33    Physicians  Panel Physicians Referring Physician Case Authorizing Physician  Wellington Hampshire, MD (Primary)    Procedures  RIGHT/LEFT HEART CATH AND CORONARY/GRAFT ANGIOGRAPHY  Conclusion  Prox RCA lesion is 50% stenosed.  SVG graft was visualized by angiography and is normal in caliber.  The graft exhibits no disease.  Ost Cx to Prox Cx lesion is 100% stenosed.  Mid LM to Dist LM lesion is 90% stenosed.  Prox LAD lesion is 100% stenosed.  LIMA graft was visualized by angiography and is normal in caliber.  The graft exhibits no disease. 1. Significant left main and two-vessel coronary artery disease involving the LAD and left circumflex. Patent LIMA to LAD and SVG to OM1 supplying the whole left circumflex distribution. Moderate proximal RCA stenosis with no  obstructive disease.  2. Right heart catheterization showed mildly elevated left-sided filling pressures, mild pulmonary hypertension and normal cardiac output. Prominent V wave on pulmonary capillary wedge pressure tracing suggestive of mitral regurgitation  3. Severe aortic stenosis by echocardiogram. I did not attempt to cross the aortic valve.  Recommendations:  Recommend evaluation at the valve clinic for possible TAVR. In spite of presence of V waves on pulmonary wedge pressure tracings, I reviewed the echocardiogram and I do not think the mitral regurgitation is more than moderate. There does seem to be mild degree of mitral stenosis.  The aortic valve is heavily calcified with severely restricted opening.   Indications  Nonrheumatic aortic valve stenosis [I35.0 (ICD-10-CM)]  Procedural Details/Technique  Technical Details Procedural Details: The pre-existing IV in the left antecubital vein was exchanged under sterile fashion to a slender sheath. Right heart catheterization was performed using a 5 French Swan-Ganz catheter. Cardiac output was calculated by the Fick method. The left wrist was prepped, draped, and anesthetized with 1% lidocaine. Using the modified Seldinger technique, a 5 French sheath was introduced into the left radial artery. 3 mg of verapamil was administered through the sheath, weight-based unfractionated heparin was administered intravenously. A TIG catheter was used for selective coronary angiography. A JL4 was needed to engage the left main coronary artery. An IM catheter was used to engage the LIMA graft. I did not attempt to cross the aortic valve. Catheter exchanges were performed over an exchange length guidewire. There were no immediate procedural complications. A TR band was used for radial hemostasis at the completion of the procedure. The patient was transferred to the post catheterization recovery area for further monitoring.   Estimated blood loss <50  mL.  During this procedure the patient was administered the following to achieve and maintain moderate conscious sedation: Versed 1 mg, while the patient's heart rate, blood pressure, and oxygen saturation were continuously monitored. The period of conscious sedation was 40 minutes, of which I was present face-to-face 100% of this time.  Coronary Findings  Diagnostic  Dominance: Right  Left Main  Mid LM to Dist LM lesion 90% stenosed  Mid LM to Dist LM lesion is 90% stenosed.  Left Anterior Descending  Prox LAD lesion 100% stenosed  Prox LAD lesion is 100% stenosed.  Left Circumflex  Ost Cx to Prox Cx lesion 100% stenosed  Ost Cx to Prox Cx lesion is 100% stenosed.  Right Coronary Artery  Prox RCA lesion 50% stenosed  Prox RCA lesion is 50% stenosed.  saphenous Graft to 1st Mrg  SVG graft was visualized  by angiography and is normal in caliber. The graft exhibits no disease.  LIMA LIMA Graft to Mid LAD  LIMA graft was visualized by angiography and is normal in caliber. The graft exhibits no disease.  Intervention  No interventions have been documented.  Coronary Diagrams  Diagnostic Diagram     Implants     No implant documentation for this case.  MERGE Images  Link to Procedure Log   Show images for CARDIAC CATHETERIZATION Procedure Log  Hemo Data   Most Recent Value  Fick Cardiac Output 7.02 L/min  Fick Cardiac Output Index 3.34 (L/min)/BSA  RA A Wave 9 mmHg  RA V Wave 9 mmHg  RA Mean 7 mmHg  RV Systolic Pressure 44 mmHg  RV Diastolic Pressure 0 mmHg  RV EDP 6 mmHg  PA Systolic Pressure 44 mmHg  PA Diastolic Pressure 16 mmHg  PA Mean 27 mmHg  PW A Wave 20 mmHg  PW V Wave 33 mmHg  PW Mean 18 mmHg  AO Systolic Pressure 836 mmHg  AO Diastolic Pressure 68 mmHg  AO Mean 108 mmHg  QP/QS 1  TPVR Index 8.09 HRUI  TSVR Index 32.34 HRUI  PVR SVR Ratio 0.09  TPVR/TSVR Ratio 0.25     ADDENDUM REPORT: 05/30/2018 10:34  EXAM:  OVER-READ INTERPRETATION CT CHEST  The  following report is an over-read performed by radiologist Dr.  Rebekah Chesterfield [facility] on 05/30/2018. This  over-read does not include interpretation of cardiac or coronary  anatomy or pathology. The coronary CTA interpretation by the  cardiologist is attached.  COMPARISON: None.  FINDINGS:  Extracardiac findings will be described separately under dictation  for contemporaneously obtained CTA chest, abdomen and pelvis.  IMPRESSION:  Please see separate dictation for contemporaneously obtained CTA  chest, abdomen and pelvis dated 05/30/2018 for full description of  relevant extracardiac findings.  Electronically Signed  By: Vinnie Langton M.D.  On: 05/30/2018 10:34   Addended by Etheleen Mayhew, MD on 05/30/2018 10:36 AM  Study Result   CLINICAL DATA: Aortic Stenosis  EXAM:  Cardiac TAVR CT  TECHNIQUE:  The patient was scanned on a Siemens Force 629 slice scanner. A 120  kV retrospective scan was triggered in the ascending thoracic aorta  at 140 HU's. Gantry rotation speed was 250 msecs and collimation was  .6 mm. No beta blockade or nitro were given. The 3D data set was  reconstructed in 5% intervals of the R-R cycle. Systolic and  diastolic phases were analyzed on a dedicated work station using  MPR, MIP and VRT modes. The patient received 80 cc of contrast.  FINDINGS:  Aortic Valve: Tri leaflet calcified with restricted leaflet motion.  Extensive bulky calcification of the annulus at the base of the non  coronary cusp  Aorta: Severe calcific atherosclerotic debris  Sino-tubular Junction: 29 mm  Ascending Thoracic Aorta: 31 mm  Aortic Arch: 27 mm  Descending Thoracic Aorta: 25 mm  Sinus of Valsalva Measurements:  Non-coronary: 34.5 mm  Right - coronary: 32 mm  Left - coronary: 34 mm  Coronary Artery Height above Annulus:  Left Main: 9.9 mm above annulus  Right Coronary: 14 mm above annulus  Virtual Basal Annulus Measurements:  Maximum / Minimum  Diameter: 30.1 x 23.3 mm  Perimeter: 86 mm  Area: 569 mm2  Coronary Arteries: Sufficient height above annulus for deployment  Patent SVG to OM1 and Patent LIMA to LAD  Optimum Fluoroscopic Angle for Delivery: LAO 23 Caudal 12 degrees  IMPRESSION:  1. Calcified tri leaflet AV with annular area of 569 mm2 suitable  for a 29 mm Sapien 3 valve  2. Coronary arteries sufficient height above annulus for deployment  with patent LIMA to LAD and patent SVG to OM1  3. No LAA thrombus  4. Optimum angiographic angle for deployment LAO 23 Caudal 12  degrees  5. Severe calcific atherosclerotic debris in thoracic aorta and  bulky calcification of the annulus at the base of the non coronary  cusp  Jenkins Rouge  Electronically Signed:  By: Jenkins Rouge M.D.  On: 05/30/2018 10:19     CLINICAL DATA: 77 year old male with history of severe aortic  stenosis. Preprocedural study prior to potential transcatheter  aortic valve replacement (TAVR) procedure  EXAM:  CT ANGIOGRAPHY CHEST, ABDOMEN AND PELVIS  TECHNIQUE:  Multidetector CT imaging through the chest, abdomen and pelvis was  performed using the standard protocol during bolus administration of  intravenous contrast. Multiplanar reconstructed images and MIPs were  obtained and reviewed to evaluate the vascular anatomy.  CONTRAST: 156m ISOVUE-370 IOPAMIDOL (ISOVUE-370) INJECTION 76%  COMPARISON: None.  FINDINGS:  CTA CHEST FINDINGS  Cardiovascular: Heart size is normal. There is no significant  pericardial fluid, thickening or pericardial calcification. There is  aortic atherosclerosis, as well as atherosclerosis of the great  vessels of the mediastinum and the coronary arteries, including  calcified atherosclerotic plaque in the left main, left anterior  descending, left circumflex and right coronary arteries. Status post  median sternotomy for CABG including LIMA to the LAD. Severe  thickening calcification of the aortic valve. Severe  calcifications  of the mitral annulus.  Mediastinum/Lymph Nodes: No pathologically enlarged mediastinal or  hilar lymph nodes. Moderate-sized hiatal hernia. No axillary  lymphadenopathy.  Lungs/Pleura: There is a large mass in the right upper lobe (axial  image 27 of series 8) measuring 4.1 x 4.8 x 6.0 cm. 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 Multifocal  scarring in the lung bases bilaterally. No definite consolidative  airspace disease. No pleural effusions.  Musculoskeletal/Soft Tissues: Median sternotomy wires. There are no  aggressive appearing lytic or blastic lesions noted in the  visualized portions of the skeleton.  CTA ABDOMEN AND PELVIS FINDINGS  Hepatobiliary: 7 mm hypervascular lesion in segment 2 of the liver  (axial image 81 of series 6), too small to characterize. 9 mm  intermediate attenuation lesion in segment 4A of the liver,  incompletely characterized. Small calcified granuloma in the right  lobe of the liver. No intra or extrahepatic biliary ductal  dilatation. Gallbladder is normal in appearance.  Pancreas: No pancreatic mass. No pancreatic ductal dilatation. No  pancreatic or peripancreatic fluid or inflammatory changes.  Spleen: Unremarkable.  Adrenals/Urinary Tract: Multiple low-attenuation lesions in both  kidneys, compatible with simple cysts, largest of which is a 9.2 cm  exophytic lesion in the anterior aspect of the lower pole the left  kidney. Several other subcentimeter low-attenuation lesions in both  kidneys are too small to definitively characterize, but are  statistically likely to represent tiny cysts. No  hydroureteronephrosis. Urinary bladder is normal in appearance.  Bilateral adrenal glands are normal in appearance.  Stomach/Bowel: Normal appearance of the intra-abdominal portion of    the stomach. No pathologic dilatation of small bowel or colon.  Normal appendix.  Vascular/Lymphatic: Aortic atherosclerosis, with vascular findings  and measurements pertinent to potential TAVR procedure, as detailed  below. No aneurysm or dissection noted in the abdominal or pelvic  vasculature. No lymphadenopathy noted in the abdomen or pelvis.  Reproductive: Prostate gland and seminal vesicles are unremarkable  in appearance.  Other: No significant volume of ascites. No pneumoperitoneum.  Musculoskeletal: There are no aggressive appearing lytic or blastic  lesions noted in the visualized portions of the skeleton.  VASCULAR MEASUREMENTS PERTINENT TO TAVR:  AORTA:  Minimal Aortic Diameter-14 x 14 mm  Severity of Aortic Calcification-severe  RIGHT PELVIS:  Right Common Iliac Artery -  Minimal Diameter-9.5 x 8.0 mm  Tortuosity-mild  Calcification-severe  Right External Iliac Artery -  Minimal Diameter-7.5 x 2.8 mm  Tortuosity - mild  Calcification - severe  Right Common Femoral Artery -  Minimal Diameter-4.6 x 5.2 mm  Tortuosity - mild  Calcification - severe  LEFT PELVIS:  Left Common Iliac Artery -  Minimal Diameter-8.0 x 5.4 mm  Tortuosity - mild  Calcification - severe  Left External Iliac Artery -  Minimal Diameter-5.9 x 3.4 mm  Tortuosity - mild  Calcification - severe  Left Common Femoral Artery -  Minimal Diameter-6.3 x 1.9 mm  Tortuosity - mild  Calcification - severe  Review of the MIP images confirms the above findings.  IMPRESSION:  1. Vascular findings and measurements pertinent to potential TAVR  procedure, as detailed above.  2. Severe thickening calcification of the aortic valve, compatible  with the reported clinical history of severe aortic stenosis.  3. Aortic atherosclerosis, in addition to left main and 3 vessel  coronary artery disease. Status post median sternotomy for CABG  including LIMA to the LAD.  4. 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 Further evaluation with  nonemergent PET-CT is strongly recommended in the near future.  5. Indeterminate liver lesions, as above. These could be  definitively characterized with nonemergent MRI of the abdomen with  and without IV gadolinium if clinically appropriate.  6. Additional incidental findings, as above.  Electronically Signed  By: Vinnie Langton M.D.  On: 05/30/2018 12:03    CLINICAL DATA: Initial treatment strategy for RIGHT upper lobe  mass.  EXAM:  NUCLEAR MEDICINE PET SKULL BASE TO THIGH  TECHNIQUE:  9.9 mCi F-18 FDG was injected intravenously. Full-ring PET imaging  was performed from the skull base to thigh after the radiotracer. CT  data was obtained and used for attenuation correction and anatomic  localization.  Fasting blood glucose: 115 mg/dl  COMPARISON: CT 05/30/2018  FINDINGS:  Mediastinal blood pool activity: SUV max 2.65  NECK: No hypermetabolic lymph nodes in the neck.  Incidental CT findings: none  CHEST: Focus of consolidation in the RIGHT upper lobe is little  changed over the 2 week interval from comparison CT measuring 33 x  28 mm compared with 33 by 29 mm. Lesion is hypermetabolic. There is  variable metabolic activity with the most intense activity inferior  medially with SUV max equal 5.4.  There is a focus of consolidation/atelectasis in the inferior LEFT  lower lobe laterally is also similar to comparison exam measuring 21  mm (image 65/8) which also has metabolic activity with SUV max equal  5.4.  No additional suspicious nodules or masses.  No hypermetabolic mediastinal lymph nodes. No supraclavicular  adenopathy.  Incidental CT findings: none  ABDOMEN/PELVIS: There is hypermetabolic activity associated with a  hiatal hernia which is favored physiologic. No abnormal activity in  the liver. No hypermetabolic upper abdominal lymph  nodes. No pelvic  lymphadenopathy.  No  abnormality of the bowel.  Incidental CT findings: Large LEFT renal cysts.  SKELETON: No focal hypermetabolic activity to suggest skeletal  metastasis.  Incidental CT findings: none  IMPRESSION:  1. RIGHT upper lobe consolidation with associated hypermetabolic  activity is not changed in 2 week interval. Finding is concerning  for bronchogenic carcinoma with postobstructive consolidation  although pulmonary infection is not excluded. Consider bronchoscopy  for tissue sampling. The inferior medial aspect of the lesion is  most intense.  2. No evidence of metastatic mediastinal lymphadenopathy.  3. Second post focus of metabolic activity associated atelectasis /  consolidation in the LEFT lower lobe is indeterminate. Recommend  close attention on follow-up. This lesion would be difficult to  sample.  Electronically Signed  By: Suzy Bouchard M.D.  On: 06/13/2018 11:11    Pulmonary function test   Ref Range & Units 4wk ago  FVC-%Pred-Pre % 66   FVC-Post L 3.11   FVC-%Pred-Post % 72   FVC-%Change-Post % 9   FEV1-Pre L 1.55   FEV1-%Pred-Pre % 49   FEV1-Post L 1.86   FEV1-%Pred-Post % 60   FEV1-%Change-Post % 20   FEV6-Pre L 2.60   FEV6-%Pred-Pre % 64   FEV6-Post L 2.92   FEV6-%Pred-Post % 72   FEV6-%Change-Post % 12   Pre FEV1/FVC ratio % 54   FEV1FVC-%Pred-Pre % 75   Post FEV1/FVC ratio % 60   FEV1FVC-%Change-Post % 9   Pre FEV6/FVC Ratio % 91   FEV6FVC-%Pred-Pre % 97   Post FEV6/FVC ratio % 94   FEV6FVC-%Pred-Post % 99   FEV6FVC-%Change-Post % 2   FEF 25-75 Pre L/sec 0.61   FEF2575-%Pred-Pre % 27   FEF 25-75 Post L/sec 1.08   FEF2575-%Pred-Post % 48   FEF2575-%Change-Post % 75   RV L 4.34   RV % pred % 163   TLC L 7.10   TLC % pred % 97   DLCO unc ml/min/mmHg 17.88   DLCO unc % pred % 53   DL/VA ml/min/mmHg/L 3.62   DL/VA % pred % 77     Impression:   This 77 year old gentleman has stage D, critical, symptomatic aortic stenosis with [place] Heart  Association class II symptoms of exertional fatigue and shortness of breath. I have personally reviewed his echocardiogram, cardiac cath, and CTA studies. Echocardiogram shows a trileaflet aortic valve that is severely thickened and moderately calcified with severely reduced leaflet mobility. The mean gradient is 60 mmHg consistent with severe aortic stenosis. Left ventricular ejection fraction is 60 to 65% with grade 2 diastolic dysfunction. 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 I agree that aortic valve replacement is indicated in this patient with critical aortic stenosis. His gated cardiac CTA shows anatomy suitable for a 29 mm Sapien 3 valve with no complicating features. 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 A PET scan showed hypermetabolic activity within this lesion with a SUV max of 5.4. There is also a small focus of consolidation/atelectasis in the inferior left lower lobe which has metabolic activity with an SUV max of 5.4 and this is indeterminate. There were no other suspicious findings on the PET scan. He underwent flexible bronchoscopy with biopsies and brushings of the right  upper lobe by Dr. Elsworth Soho and all the specimens were negative for malignancy. The cultures did grow Pseudomonas. The patient has been treated with a one-week course of Levaquin in case this is a pneumonia. The plan by Dr. Elsworth Soho was to do a follow-up CT scan of the chest in 3 months to reevaluate this. I discussed the option of proceeding with a CT-guided needle biopsy prior to performing TAVR. If this was positive for carcinoma I do not think it would change the need for TAVR since he has no signs of metastatic disease. If the biopsy was  negative then we would still proceed with TAVR and continue to follow the lesion on CT scan. I think the best option is probably to proceed with TAVR now and then do a follow-up CT scan in about 3 months. If this lesion is unchanged or larger than we need to proceed with further work-up. The patient is in agreement with that plan. 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 He also has significant calcific plaque and narrowing at the takeoff of his left subclavian artery so I do not think that is an option. His left carotid artery comes off the innominate artery and a bovine trunk so I think insertion by either carotid artery could potentially decrease flow in both. I think the best option for this patient will be to proceed with transapical insertion which will minimize the risk of vascular complications and stroke.  The patient and his wife were counseled at length regarding treatment alternatives for management of severe symptomatic aortic stenosis. The risks and benefits of surgical intervention has been discussed in detail. Long-term prognosis with medical therapy was discussed. Alternative approaches such as conventional surgical aortic valve replacement, transcatheter aortic valve replacement, and palliative medical therapy were compared and contrasted at length. This discussion was placed in the context of the patient's own specific clinical presentation and past medical history. All of their questions have been addressed.  Following the decision to proceed with transcatheter aortic valve replacement, a discussion was held regarding what types of management strategies would be attempted intraoperatively in the event of life-threatening complications, including whether or not the patient would be considered a candidate for the use of cardiopulmonary bypass and/or  conversion to open sternotomy for attempted surgical intervention. Since he has already had coronary bypass graft surgery and is 77 years old I do not think he would be a candidate for sternotomy in the emergency setting.  The patient has been advised of a variety of complications that might develop including but not limited to risks of death, stroke, paravalvular leak, aortic dissection or other major vascular complications, aortic annulus rupture, device embolization, cardiac rupture or perforation, mitral regurgitation, acute myocardial infarction, arrhythmia, heart block or bradycardia requiring permanent pacemaker placement, congestive heart failure, respiratory failure, renal failure, pneumonia, infection, other late complications related to structural valve deterioration or migration, or other complications that might ultimately cause a temporary or permanent loss of functional independence or other long term morbidity. The patient provides full informed consent for the procedure as described and all questions were answered.   Plan:   Transapical transcatheter aortic valve replacement.    [name], MD

## 2018-07-02 NOTE — Progress Notes (Signed)
  [place] VALVE TEAM  Patient doing well s/p TAVR. He is hemodynamically stable. BP elevated but having pain at chest incision site. A little agitated. Treating with IV morphine. Chest tube in place. ECG not yet completed but tele with sinus with PACs and old LBBB. No HAVB. CXR with mild pulmonary vascular congestion. With elevated BP and mild pulmonary vascular congestion I will resume home Hyzaar. Continue to monitor.  Angelena Form PA-C  [facility]  Pager [phone]

## 2018-07-02 NOTE — Op Note (Signed)
CARDIOTHORACIC SURGERY OPERATIVE NOTE  Date of Procedure:  07/02/2018  Preoperative Diagnosis: Severe Aortic Stenosis   Postoperative Diagnosis: Same   Procedure:    Transcatheter Aortic Valve Replacement - Transapical Approach  Edwards Sapien XT THV (size 29 mm, model # 9300TFX, serial # [id number])   Co-Surgeons:  [name], MD and Sherren Mocha, MD   Anesthesiologist:  Renold Don, MD  Echocardiographer:  Jenkins Rouge, MD  Pre-operative Echo Findings:   severe aortic stenosis  Normal left ventricular systolic function    Post-operative Echo Findings:  Mild paravalvular leak  Normal left ventricular systolic function  BRIEF CLINICAL NOTE AND INDICATIONS FOR SURGERY  This 77 year old gentleman has stage D, critical, symptomatic aortic stenosis with New York Heart Association class II symptoms of exertional fatigue and shortness of breath. I have personally reviewed his echocardiogram, cardiac cath, and CTA studies. Echocardiogram shows a trileaflet aortic valve that is severely thickened and moderately calcified with severely reduced leaflet mobility. The mean gradient is 60 mmHg consistent with severe aortic stenosis. Left ventricular ejection fraction is 60 to 65% with grade 2 diastolic dysfunction. 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 I agree that aortic valve replacement is indicated in this patient with critical aortic stenosis. His gated cardiac CTA shows anatomy suitable for a 29 mm Sapien 3 valve with no complicating features. 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 A PET scan showed hypermetabolic activity within this lesion with a SUV max of 5.4. There is also a small  focus of consolidation/atelectasis in the inferior left lower lobe which has metabolic activity with an SUV max of 5.4 and this is indeterminate. There were no other suspicious findings on the PET scan. He underwent flexible bronchoscopy with biopsies and brushings of the right upper lobe by Dr. Elsworth Soho and all the specimens were negative for malignancy. The cultures did grow Pseudomonas. The patient has been treated with a one-week course of Levaquin in case this is a pneumonia. The plan by Dr. Elsworth Soho was to do a follow-up CT scan of the chest in 3 months to reevaluate this. I discussed the option of proceeding with a CT-guided needle biopsy prior to performing TAVR. If this was positive for carcinoma I do not think it would change the need for TAVR since he has no signs of metastatic disease. If the biopsy was negative then we would still proceed with TAVR and continue to follow the lesion on CT scan. I think the best option is probably to proceed with TAVR now and then do a follow-up CT scan in about 3 months. If this lesion is unchanged or larger than we need to proceed with further work-up. The patient is in agreement with that plan. 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 He also has significant calcific plaque and narrowing at the takeoff of his left subclavian artery so I do not think that is an option. His left carotid artery comes off the innominate artery and a bovine trunk so I think insertion by either carotid artery could potentially decrease flow in both. I think the best option for this patient will be to  proceed with transapical insertion which will minimize the risk of vascular complications and stroke.  The patient and his wife were counseled at length regarding treatment alternatives for management of severe symptomatic aortic stenosis. The risks and benefits  of surgical intervention has been discussed in detail. Long-term prognosis with medical therapy was discussed. Alternative approaches such as conventional surgical aortic valve replacement, transcatheter aortic valve replacement, and palliative medical therapy were compared and contrasted at length. This discussion was placed in the context of the patient's own specific clinical presentation and past medical history. All of their questions have been addressed.  Following the decision to proceed with transcatheter aortic valve replacement, a discussion was held regarding what types of management strategies would be attempted intraoperatively in the event of life-threatening complications, including whether or not the patient would be considered a candidate for the use of cardiopulmonary bypass and/or conversion to open sternotomy for attempted surgical intervention. Since he has already had coronary bypass graft surgery and is 77 years old I do not think he would be a candidate for sternotomy in the emergency setting.  The patient has been advised of a variety of complications that might develop including but not limited to risks of death, stroke, paravalvular leak, aortic dissection or other major vascular complications, aortic annulus rupture, device embolization, cardiac rupture or perforation, mitral regurgitation, acute myocardial infarction, arrhythmia, heart block or bradycardia requiring permanent pacemaker placement, congestive heart failure, respiratory failure, renal failure, pneumonia, infection, other late complications related to structural valve deterioration or migration, or other complications that might ultimately cause a temporary or permanent loss of functional independence or other long term morbidity. The patient provides full informed consent for the procedure as described and all questions were answered.     DETAILS OF THE OPERATIVE PROCEDURE  PREPARATION:    The patient is brought  to the operating room on the above mentioned date and central monitoring was established by the anesthesia team including placement of Swan-Ganz catheter and radial arterial line. The patient is placed in the supine position on the operating table.  Intravenous antibiotics are administered. General endotracheal anesthesia is induced uneventfully. A Foley catheter is placed.  Baseline transesophageal echocardiogram was performed.  Findings were notable for a heavily calcified aortic valve with a mean gradient of 60 mm Hg consistent with severe aortic stenosis. LV systolic function was normal.  The patient's chest, abdomen, both groins, and both lower extremities are prepared and draped in a sterile manner. A time out procedure is performed.   PERIPHERAL ACCESS:    Femoral arterial and venous access is obtained with placement of 6 Fr arterial and venous sheaths on the right side.  A pigtail diagnostic catheter is passed through the right arterial sheath under fluoroscopic guidance into the aortic root.  A temporary transvenous pacemaker catheter is passed through the right femoral venous sheath under fluoroscopic guidance into the right ventricle.  The pacemaker is tested to ensure stable lead placement and pacemaker capture.   TRANSAPICAL ACCESS:   The location of the left ventricular apex is confirmed using fluoroscopy, and a miniature left thoracotomy incision is made directly over the left ventricular apex.  The left pleural space is entered.  No adhesions are encountered.  A soft tissue retractor is placed and the ribs gently spread to expose the pericardial surface.  The pericardium is adherent to the heart.   An appropriate site for left ventricular sheath placement is chosen well lateral to the left anterior descending coronary artery  and verified using TEE.  Two pursestring sutures are placed using pledgeted 2-0 Prolene suture.  The patient is heparinized systemically and ACT verified > 250  seconds.  The left ventricular apex is punctured using an  18 gauge needle and a soft J-tipped guidewire is passed into the left ventricle and through the aortic valve under fluoroscopic guidance while also continuously monitoring TEE for signs of entrapment in the mitral apparatus.  A 6 Fr sheath is placed over the guidewire and across the aortic valve.  A JR-4 catheter is passed through the sheath and maneuvered around the aortic arch into the descending thoracic aorta under fluoroscopic guidance.  An Amplatz extra stiff guidewire is passed through the JR-4 catheter into the descending thoracic aorta and both the JR-4 catheter and the introducing sheath are removed.  A 21 French Edwards Certitude introducer sheath is passed over the guidewire into the left ventricle.  The sheath position is continuously monitored and secured by the surgical assistant.     BALLOON AORTIC VALVULOPLASTY:  Not performed  TRANSCATHETER HEART VALVE DEPLOYMENT:  An Edwards Sapien 3 transcatheter heart valve (size 29 mm, model # 9300TFX, serial #[id number]) is prepared and crimped per manufacturer's guidelines, and the proper orientation of the valve is confirmed on the [place] Certitude delivery system.  The delivery system loader is advanced into the introducing sheath.  The valve and delivery system are advanced through the loader into the sheath and all air is evacuated.  The valve and balloon are advanced into the left ventricle and part way through the aortic valve. The valve is then finely positioned in the aortic valve. Valve position is also confirmed using TEE, and care is taken to make certain there is no sign of entanglement in the mitral apparatus.  Once final position of the valve has been confirmed, the valve is deployed while temporarily holding ventilation and during rapid ventricular pacing to maintain systolic blood pressure < 50 mmHg and pulse pressure < 10 mmHg.  The balloon inflation is held for >3 seconds  after reaching full deployment volume.  Once the balloon has fully deflated the balloon is retracted into the left ventricle and valve function is assessed using TEE.  There is felt to be mild paravalvular leak and no central aortic insufficiency.  Left ventricular function is  unchanged from preoperatively.  There is mild mitral regurgitation.  The patient's hemodynamic recovery following valve deployment is excellent. We decided to do a second balloon dilation with 1 cc extra volume.  Post-deployment dilatation is performed using the deployment balloon  with 1 mL extra volume added to the balloon during rapid pacing.  Following post-deployment dilatation the degree of paravalvular leak noted by TEE was mild  The deployment balloon and guidewire are both removed.  PROCEDURE COMPLETION:  The left ventricular sheath is removed during rapid ventricular pacing to maintain systolic blood pressure < 70 mmHg while the pursestring sutures are tied.  The apical closure is inspected and notably hemostatic.  Protamine is administered.    Once hemostasis has been ascertained, the left pleural space is drained using a single 28 Fr Bard chest tube and the mini thoracotomy incision is closed in layers using two # 2 pericostal sutures, #1 Vicryl suture for the muscle, 2-0 Vicryl subcutaneous suture and the skin incision closed using a 3-0 Vicryl  subcuticular skin closure.   The temporary pacemaker, pigtail catheters and all femoral sheaths are removed.  The patient tolerated the procedure well and is transported to  the surgical intensive care in stable condition. There are no intraoperative complications. All sponge instrument and needle counts are verified correct at completion of the operation.  No blood products were administered during the operation.  The patient received a total of 40 mL of intravenous contrast during the procedure.    [name], MD 07/02/2018 12:47 PM

## 2018-07-02 NOTE — Progress Notes (Deleted)
Patient interviewed upon arrival to OR. Patient able to confirm name, DOB, procedure, allergies, no metal in body and npo status. Patient able to move to OR bed with minimal assistance. Bilateral pedal pulses assessed with doppler.    Leatha Gilding, RN

## 2018-07-02 NOTE — Anesthesia Procedure Notes (Signed)
Central Venous Catheter Insertion Performed by: Audry Pili, MD, anesthesiologist Start/End10/29/2019 9:20 AM, 07/02/2018 9:27 AM Patient location: Pre-op. Preanesthetic checklist: patient identified, IV checked, risks and benefits discussed, surgical consent, monitors and equipment checked, pre-op evaluation, timeout performed and anesthesia consent Position: Trendelenburg Lidocaine 1% used for infiltration and patient sedated Hand hygiene performed , maximum sterile barriers used  and Seldinger technique used Catheter size: 8.5 Fr Central line was placed.Double lumen Procedure performed using ultrasound guided technique. Ultrasound Notes:anatomy identified, needle tip was noted to be adjacent to the nerve/plexus identified, no ultrasound evidence of intravascular and/or intraneural injection and image(s) printed for medical record Attempts: 1 Following insertion, line sutured, dressing applied and Biopatch. Post procedure assessment: blood return through all ports, free fluid flow and no air  Patient tolerated the procedure well with no immediate complications.

## 2018-07-02 NOTE — Anesthesia Procedure Notes (Signed)
Procedure Name: Intubation Date/Time: 07/02/2018 10:01 AM Performed by: Lance Coon, CRNA Pre-anesthesia Checklist: Patient identified, Emergency Drugs available, Suction available and Patient being monitored Patient Re-evaluated:Patient Re-evaluated prior to induction Oxygen Delivery Method: Circle System Utilized Preoxygenation: Pre-oxygenation with 100% oxygen Induction Type: IV induction Ventilation: Mask ventilation without difficulty Laryngoscope Size: Miller and 3 Grade View: Grade I Tube type: Oral Tube size: 7.5 mm Number of attempts: 1 Airway Equipment and Method: Stylet Placement Confirmation: ETT inserted through vocal cords under direct vision,  positive ETCO2 and breath sounds checked- equal and bilateral Secured at: 22 cm Tube secured with: Tape Dental Injury: Teeth and Oropharynx as per pre-operative assessment

## 2018-07-02 NOTE — Op Note (Signed)
CARDIOTHORACIC SURGERY OPERATIVE NOTE  Date of Procedure:  07/02/2018  Preoperative Diagnosis: Severe Aortic Stenosis   Postoperative Diagnosis: Same   Procedure:    Transcatheter Aortic Valve Replacement - Transapical Approach  Edwards Sapien XT THV (size 29 mm, model # 9300TFX, serial # [id number])   Co-Surgeons:  [name], MD and Sherren Mocha, MD  Anesthesiologist:  Renold Don, MD  Echocardiographer:  Jenkins Rouge, MD  Pre-operative Echo Findings:  Severe aortic stenosis  Normal left ventricular systolic function  Post-operative Echo Findings:  Mild paravalvular leak  Normal left ventricular systolic function  BRIEF CLINICAL NOTE AND INDICATIONS FOR SURGERY  77 yo male with prior CABG who has developed severe symptomatic aortic stenosis. Mean transaortic gradient is 60 mmHg in the setting of normal LV systolic function. Cardiac catheterization has demonstrated patency of his bypass grafts. 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 He has been evaluated by Dr Elsworth Soho with pulmonary and Dr Cyndia Bent with cardiothoracic surgery. We have all discussed his case extensively and agree that it is indicated and appropriate to proceed with TAVR to relieve symptoms of critical aortic stenosis and allow for further workup and treatment of his lung mass.  During the course of the patient's preoperative work up they have been evaluated comprehensively by a multidisciplinary team of specialists coordinated through the [facility] in the [facility].  They have been demonstrated to suffer from symptomatic severe aortic stenosis as noted above. The patient has been counseled extensively as to the relative risks and benefits of all options for the treatment of severe aortic stenosis including long term medical therapy, conventional surgery for aortic valve replacement, and transcatheter aortic valve replacement.  The  patient has been independently evaluated by Dr Cyndia Bent and they are felt to be at moderate risk for conventional surgical aortic valve replacement and that the patient would be a poor candidate for conventional surgery.   Based upon review of all of the patient's preoperative diagnostic tests they are felt to be candidate for transcatheter aortic valve replacement using the transapical approach as an alternative to high risk conventional surgery.    Following the decision to proceed with transcatheter aortic valve replacement, a discussion has been held regarding what types of management strategies would be attempted intraoperatively in the event of life-threatening complications, including whether or not the patient would be considered a candidate for the use of cardiopulmonary bypass and/or conversion to open sternotomy for attempted surgical intervention.  The patient has been advised of a variety of complications that might develop peculiar to this approach including but not limited to risks of death, stroke, paravalvular leak, aortic dissection or other major vascular complications, aortic annulus rupture, device embolization, cardiac rupture or perforation, acute myocardial infarction, arrhythmia, heart block or bradycardia requiring permanent pacemaker placement, congestive heart failure, respiratory failure, renal failure, pneumonia, infection, other late complications related to structural valve deterioration or migration, or other complications that might ultimately cause a temporary or permanent loss of functional independence or other long term morbidity.  The patient provides full informed consent for the procedure as described and all questions were answered preoperatively.  DETAILS OF THE OPERATIVE PROCEDURE  PREPARATION:   The patient is brought to the operating room on the above mentioned date and central monitoring was established by the anesthesia team including placement of Swan-Ganz  catheter and radial arterial line. The patient is placed in the supine position on the  operating table.  Intravenous antibiotics are administered. General endotracheal anesthesia is induced uneventfully. A Foley catheter is placed.  Baseline transesophageal echocardiogram was performed.   The patient's chest, abdomen, both groins, and both lower extremities are prepared and draped in a sterile manner. A time out procedure is performed.   PERIPHERAL ACCESS:    Femoral arterial and venous access is obtained with placement of 6 Fr arterial and venous sheaths on the right side.  US guidance is used and images are captured and stored in the patient's chart. A pigtail diagnostic catheter is passed through the right femoral arterial sheath under fluoroscopic guidance into the aortic root.  A temporary transvenous pacemaker catheter is passed through the right femoral venous sheath under fluoroscopic guidance into the right ventricle.  The pacemaker is tested to ensure stable lead placement and pacemaker capture.   TRANSAPICAL ACCESS:   Please see the complete note of Dr Cyndia Bent for details.   The patient is heparinized systemically and ACT verified > 250 seconds.  The left ventricular apex is punctured using an 18 gauge needle and a 0.035 inch J-tipped guidewire is passed into the left ventricle and through the aortic valve under fluoroscopic guidance while also continuously monitoring TEE for signs of entrapment in the mitral apparatus.  A 6 Fr sheath is placed over the guidewire and across the aortic valve.  A short crossover catheter is passed through the sheath and maneuvered around the aortic arch into the descending thoracic aorta under fluoroscopic guidance.  An Amplatz extra stiff guidewire is passed through the crossover catheter into the descending thoracic aorta and both the catheter and the introducing sheath are removed.  A 21 French Edwards Certitude introducer sheath is passed over the  guidewire into the left ventricle.  The sheath position is continuously monitored and secured Dr Cyndia Bent.    BALLOON AORTIC VALVULOPLASTY: Not performed   TRANSCATHETER HEART VALVE DEPLOYMENT: An Edwards Sapien 3 transcatheter heart valve (size 29 mm, model # 9300TFX, serial #[id number]) is prepared and crimped per manufacturer's guidelines, and the proper orientation of the valve is confirmed on the delivery system.  The delivery system loader is advanced into the introducing sheath.  The valve and delivery system are advanced through the loader into the sheath and all air is evacuated.  The valve and balloon are advanced into the left ventricle and part way through the aortic valve.   The valve is then finely positioned in the aortic valve and its position verified using a power injection aortogram while temporarily holding ventilation and rapid pacing.    Once final position of the valve has been confirmed, the valve is deployed while temporarily holding ventilation and during rapid ventricular pacing to maintain systolic blood pressure < 50 mmHg and pulse pressure < 10 mmHg.  The balloon inflation is held for >3 seconds after reaching full deployment volume.  Once the balloon has fully deflated the balloon is retracted into the left ventricle and valve function is assessed using TEE.   There is felt to be mild to moderate paravalvular leak and no central aortic insufficiency.   Post-deployment dilatation is performed using the deployment balloon with 2 mL extra volume added to the balloon during rapid pacing.  Following post-deployment dilatation the degree of paravalvular leak noted by TEE was mild  The deployment balloon and guidewire are both removed and simultaneous left ventricular and aortic pressures are recorded.   PROCEDURE COMPLETION: Please see the note of Dr Cyndia Bent for details. The left  ventricular sheath is removed during rapid ventricular pacing to maintain systolic blood pressure < 90  mmHg while the pursestring sutures are tied.  Protamine is administered.  The temporary pacemaker, pigtail catheters and all femoral sheaths are removed.  The patient tolerated the procedure well and is transported to the surgical intensive care in stable condition. There are no intraoperative complications. All sponge instrument and needle counts are verified correct at completion of the operation.  No blood products were administered during the operation.  The patient received a total of 40 mL of intravenous contrast during the procedure.    Sherren Mocha MD 07/02/2018 11:54 AM

## 2018-07-02 NOTE — Progress Notes (Signed)
  Echocardiogram Echocardiogram Transesophageal has been performed.  Bobbye Charleston 07/02/2018, 12:04 PM

## 2018-07-02 NOTE — Transfer of Care (Signed)
Immediate Anesthesia Transfer of Care Note  Patient: Christopher Rocha  Procedure(s) Performed: TRANSCATHETER AORTIC VALVE REPLACEMENT, TRANSAPICAL (N/A Chest) TRANSESOPHAGEAL ECHOCARDIOGRAM (TEE) (Esophagus)  Patient Location: PACU  Anesthesia Type:General  Level of Consciousness: awake and patient cooperative  Airway & Oxygen Therapy: Patient Spontanous Breathing and Patient connected to face mask oxygen  Post-op Assessment: Report given to RN and Post -op Vital signs reviewed and stable  Post vital signs: Reviewed and stable  Last Vitals:  Vitals Value Taken Time  BP 156/68 07/02/2018 12:48 PM  Temp    Pulse 66 07/02/2018 12:51 PM  Resp 13 07/02/2018 12:51 PM  SpO2 100 % 07/02/2018 12:51 PM  Vitals shown include unvalidated device data.  Last Pain:  Vitals:   07/02/18 0828  PainSc: 0-No pain      Patients Stated Pain Goal: 2 (32/[id number]/37 [id number])  Complications: No apparent anesthesia complications

## 2018-07-02 NOTE — Progress Notes (Signed)
Patient interviewed upon arrival to OR. Patient able to confirm name, DOB, procedure, allergies, no metal in body and npo status. Patient able to move to OR bed with minimal assistance. Bilateral pedal pulses assessed with doppler.      Leatha Gilding, RN

## 2018-07-02 NOTE — Anesthesia Procedure Notes (Signed)
Arterial Line Insertion Start/End10/29/2019 9:30 AM Performed by: White, Amedeo Plenty, Immunologist, CRNA  Preanesthetic checklist: patient identified, IV checked, risks and benefits discussed and monitors and equipment checked Lidocaine 1% used for infiltration Left, radial was placed Catheter size: 20 G Hand hygiene performed  and maximum sterile barriers used  Allen's test indicative of satisfactory collateral circulation Attempts: 1 Procedure performed without using ultrasound guided technique. Following insertion, dressing applied and Biopatch. Post procedure assessment: normal  Patient tolerated the procedure well with no immediate complications.

## 2018-07-03 ENCOUNTER — Encounter (HOSPITAL_COMMUNITY): Payer: Self-pay | Admitting: Cardiovascular Disease

## 2018-07-03 ENCOUNTER — Inpatient Hospital Stay (HOSPITAL_COMMUNITY): Payer: Medicare Other

## 2018-07-03 DIAGNOSIS — I350 Nonrheumatic aortic (valve) stenosis: Secondary | ICD-10-CM

## 2018-07-03 DIAGNOSIS — I340 Nonrheumatic mitral (valve) insufficiency: Secondary | ICD-10-CM

## 2018-07-03 DIAGNOSIS — Z954 Presence of other heart-valve replacement: Secondary | ICD-10-CM

## 2018-07-03 LAB — CBC
HCT: 32 % — ABNORMAL LOW (ref 39.0–52.0)
Hemoglobin: 10 g/dL — ABNORMAL LOW (ref 13.0–17.0)
MCH: 30.2 pg (ref 26.0–34.0)
MCHC: 31.3 g/dL (ref 30.0–36.0)
MCV: 96.7 fL (ref 80.0–100.0)
PLATELETS: 180 10*3/uL (ref 150–400)
RBC: 3.31 MIL/uL — ABNORMAL LOW (ref 4.22–5.81)
RDW: 13.2 % (ref 11.5–15.5)
WBC: 19.1 10*3/uL — AB (ref 4.0–10.5)
nRBC: 0 % (ref 0.0–0.2)

## 2018-07-03 LAB — BASIC METABOLIC PANEL
Anion gap: 8 (ref 5–15)
BUN: 21 mg/dL (ref 8–23)
CALCIUM: 9 mg/dL (ref 8.9–10.3)
CO2: 25 mmol/L (ref 22–32)
Chloride: 103 mmol/L (ref 98–111)
Creatinine, Ser: 1.13 mg/dL (ref 0.61–1.24)
GLUCOSE: 164 mg/dL — AB (ref 70–99)
Potassium: 4.7 mmol/L (ref 3.5–5.1)
Sodium: 136 mmol/L (ref 135–145)

## 2018-07-03 LAB — MAGNESIUM: Magnesium: 1.5 mg/dL — ABNORMAL LOW (ref 1.7–2.4)

## 2018-07-03 MED ORDER — TRAMADOL HCL 50 MG PO TABS
50.0000 mg | ORAL_TABLET | ORAL | Status: DC | PRN
  Administered 2018-07-03: 50 mg via ORAL
  Filled 2018-07-03: qty 1

## 2018-07-03 MED ORDER — MAGNESIUM SULFATE 2 GM/50ML IV SOLN
2.0000 g | Freq: Once | INTRAVENOUS | Status: AC
  Administered 2018-07-03: 2 g via INTRAVENOUS
  Filled 2018-07-03: qty 50

## 2018-07-03 MED FILL — Dexmedetomidine HCl in NaCl 0.9% IV Soln 400 MCG/100ML: INTRAVENOUS | Qty: 100 | Status: AC

## 2018-07-03 NOTE — Progress Notes (Signed)
CARDIAC REHAB PHASE I   PRE:  Rate/Rhythm: 73 SR  BP:  Sitting: 128/54        SaO2: 98 2L  MODE:  Ambulation: 290 ft   POST:  Rate/Rhythm: 76 SR  BP:  Sitting: 124/49        SaO2: 94 2L  1130 - 1220  Pt ambulated 290 ft with walker and assistance x2. Tolerated fair. Gait was slow and staggered. Pt c/o pain and had difficulty getting out of bed. Pt needed O2 during ambulation. O2 was removed when pt was left in room, but SaO2 fell to 87, so pt was put back on 2L. Pt in recliner with call bell within reach. Family in room.   Philis Kendall, MS 07/03/2018 12:14 PM

## 2018-07-03 NOTE — Progress Notes (Signed)
  Echocardiogram 2D Echocardiogram has been performed.  Jannett Celestine 07/03/2018, 9:57 AM

## 2018-07-03 NOTE — Progress Notes (Signed)
1 Day Post-Op Procedure(s) (LRB): TRANSCATHETER AORTIC VALVE REPLACEMENT, TRANSAPICAL (N/A) TRANSESOPHAGEAL ECHOCARDIOGRAM (TEE) Subjective: Sore  Objective: Vital signs in last 24 hours: Temp:  [97.5 F (36.4 C)-98.5 F (36.9 C)] 98.5 F (36.9 C) (10/30 0400) Pulse Rate:  [59-94] 87 (10/30 0500) Resp:  [13-40] 34 (10/30 0500) BP: (110-197)/(51-91) 124/70 (10/30 0500) SpO2:  [89 %-100 %] 91 % (10/30 0500) Arterial Line BP: (114-168)/(46-68) 122/46 (10/29 1730)  Hemodynamic parameters for last 24 hours:    Intake/Output from previous day: 10/29 0701 - 10/30 0700 In: 1649.4 [P.O.:30; I.V.:1419.6; IV Piggyback:199.9] Out: 1020 [Urine:810; Blood:30; Chest Tube:180] Intake/Output this shift: Total I/O In: 341.8 [I.V.:142; IV Piggyback:199.9] Out: 300 [Urine:200; Chest Tube:100]  General appearance: alert and cooperative Neurologic: intact Heart: regular rate and rhythm, S1, S2 normal, no murmur, click, rub or gallop Lungs: clear to auscultation bilaterally Extremities: extremities normal, atraumatic, no cyanosis or edema Wound: incision ok Chest tube output low.  Lab Results: Recent Labs    07/02/18 1316 07/03/18 0301  WBC  --  19.1*  HGB 8.8* 10.0*  HCT 26.0* 32.0*  PLT  --  180   BMET:  Recent Labs    07/02/18 1316 07/03/18 0301  NA 138 136  K 4.6 4.7  CL  --  103  CO2  --  25  GLUCOSE 165* 164*  BUN  --  21  CREATININE  --  1.13  CALCIUM  --  9.0    PT/INR: No results for input(s): LABPROT, INR in the last 72 hours. ABG    Component Value Date/Time   PHART 7.399 06/28/2018 1023   HCO3 25.1 06/28/2018 1023   TCO2 27 05/08/2018 1619   ACIDBASEDEF 1.0 05/08/2018 1615   O2SAT 93.6 06/28/2018 1023   CBG (last 3)  No results for input(s): GLUCAP in the last 72 hours.  Assessment/Plan: S/P Procedure(s) (LRB): TRANSCATHETER AORTIC VALVE REPLACEMENT, TRANSAPICAL (N/A) TRANSESOPHAGEAL ECHOCARDIOGRAM (TEE)  Hemodynamically stable in sinus  rhythm. Plan 2D echo today. Remove chest tube. Follow up CXR afterwards. IS, ambulation Will keep in ICU today to ambulate and work on pulmonary toilet since he had thoracotomy and has right lung mass/infiltrate.   LOS: 1 day    [name] 07/03/2018

## 2018-07-03 NOTE — Progress Notes (Signed)
Patient ID: Christopher Rocha, male   DOB: 12-Jul-1941, 77 y.o.   MRN: [id number] EVENING ROUNDS NOTE :     [address]       [place] [address]             [phone]                 1 Day Post-Op Procedure(s) (LRB): TRANSCATHETER AORTIC VALVE REPLACEMENT, TRANSAPICAL (N/A) TRANSESOPHAGEAL ECHOCARDIOGRAM (TEE)  Total Length of Stay:  LOS: 1 day  BP (!) 117/44   Pulse 72   Temp 97.8 F (36.6 C) (Oral)   Resp 20   Wt 89.8 kg   SpO2 98%   BMI 27.61 kg/m   .Intake/Output      10/30 0701 - 10/31 0700   P.O.    I.V. (mL/kg)    IV Piggyback 150   Total Intake(mL/kg) 150 (1.7)   Urine (mL/kg/hr) 150 (0.1)   Blood    Chest Tube    Total Output 150   Net 0         . sodium chloride    . nitroGLYCERIN    . phenylephrine (NEO-SYNEPHRINE) Adult infusion       Lab Results  Component Value Date   WBC 19.1 (H) 07/03/2018   HGB 10.0 (L) 07/03/2018   HCT 32.0 (L) 07/03/2018   PLT 180 07/03/2018   GLUCOSE 164 (H) 07/03/2018   ALT 21 06/28/2018   AST 18 06/28/2018   NA 136 07/03/2018   K 4.7 07/03/2018   CL 103 07/03/2018   CREATININE 1.13 07/03/2018   BUN 21 07/03/2018   CO2 25 07/03/2018   INR 1.03 06/28/2018   HGBA1C 6.0 (H) 06/28/2018   Chest tube out this am Stable day    Grace Isaac MD  Beeper [phone] Office [phone] 07/03/2018 7:21 PM

## 2018-07-04 ENCOUNTER — Other Ambulatory Visit: Payer: Self-pay | Admitting: Physician Assistant

## 2018-07-04 DIAGNOSIS — Z952 Presence of prosthetic heart valve: Secondary | ICD-10-CM

## 2018-07-04 LAB — BASIC METABOLIC PANEL
ANION GAP: 5 (ref 5–15)
BUN: 38 mg/dL — ABNORMAL HIGH (ref 8–23)
CO2: 26 mmol/L (ref 22–32)
CREATININE: 1.58 mg/dL — AB (ref 0.61–1.24)
Calcium: 9.2 mg/dL (ref 8.9–10.3)
Chloride: 103 mmol/L (ref 98–111)
GFR calc non Af Amer: 41 mL/min — ABNORMAL LOW (ref >60)
GFR, EST AFRICAN AMERICAN: 47 mL/min — AB (ref >60)
Glucose, Bld: 155 mg/dL — ABNORMAL HIGH (ref 70–99)
Potassium: 4.3 mmol/L (ref 3.5–5.1)
SODIUM: 134 mmol/L — AB (ref 135–145)

## 2018-07-04 LAB — ECHOCARDIOGRAM COMPLETE: WEIGHTICAEL: 3167.57 [oz_av]

## 2018-07-04 LAB — CBC
HCT: 30.7 % — ABNORMAL LOW (ref 39.0–52.0)
Hemoglobin: 9.6 g/dL — ABNORMAL LOW (ref 13.0–17.0)
MCH: 30.6 pg (ref 26.0–34.0)
MCHC: 31.3 g/dL (ref 30.0–36.0)
MCV: 97.8 fL (ref 80.0–100.0)
NRBC: 0 % (ref 0.0–0.2)
Platelets: 135 10*3/uL — ABNORMAL LOW (ref 150–400)
RBC: 3.14 MIL/uL — ABNORMAL LOW (ref 4.22–5.81)
RDW: 14.1 % (ref 11.5–15.5)
WBC: 25.5 10*3/uL — AB (ref 4.0–10.5)

## 2018-07-04 LAB — MAGNESIUM: MAGNESIUM: 2.2 mg/dL (ref 1.7–2.4)

## 2018-07-04 MED ORDER — DOCUSATE SODIUM 100 MG PO CAPS
200.0000 mg | ORAL_CAPSULE | Freq: Every day | ORAL | Status: DC
  Administered 2018-07-04 – 2018-07-07 (×4): 200 mg via ORAL
  Filled 2018-07-04 (×4): qty 2

## 2018-07-04 MED ORDER — ONDANSETRON HCL 4 MG/2ML IJ SOLN: 4.0000 mg | Freq: Four times a day (QID) | INTRAMUSCULAR | Status: DC | PRN

## 2018-07-04 MED ORDER — MOVING RIGHT ALONG BOOK
Freq: Once | Status: AC
  Administered 2018-07-04: 1
  Filled 2018-07-04: qty 1

## 2018-07-04 MED ORDER — ONDANSETRON HCL 4 MG PO TABS: 4.0000 mg | ORAL_TABLET | Freq: Four times a day (QID) | ORAL | Status: DC | PRN

## 2018-07-04 MED ORDER — SODIUM CHLORIDE 0.9 % IV SOLN: 250.0000 mL | INTRAVENOUS | Status: DC | PRN

## 2018-07-04 MED ORDER — SODIUM CHLORIDE 0.9% FLUSH
3.0000 mL | Freq: Two times a day (BID) | INTRAVENOUS | Status: DC
  Administered 2018-07-04 – 2018-07-07 (×7): 3 mL via INTRAVENOUS

## 2018-07-04 MED ORDER — BISACODYL 10 MG RE SUPP: 10.0000 mg | Freq: Every day | RECTAL | Status: DC | PRN

## 2018-07-04 MED ORDER — ACETAMINOPHEN 325 MG PO TABS: 650.0000 mg | ORAL_TABLET | Freq: Four times a day (QID) | ORAL | Status: DC | PRN

## 2018-07-04 MED ORDER — METOPROLOL TARTRATE 25 MG PO TABS
25.0000 mg | ORAL_TABLET | Freq: Two times a day (BID) | ORAL | Status: DC
  Administered 2018-07-04 – 2018-07-07 (×7): 25 mg via ORAL
  Filled 2018-07-04 (×7): qty 1

## 2018-07-04 MED ORDER — TRAMADOL HCL 50 MG PO TABS
50.0000 mg | ORAL_TABLET | ORAL | Status: DC | PRN
  Administered 2018-07-04: 50 mg via ORAL
  Filled 2018-07-04: qty 1

## 2018-07-04 MED ORDER — BISACODYL 5 MG PO TBEC: 10.0000 mg | DELAYED_RELEASE_TABLET | Freq: Every day | ORAL | Status: DC | PRN

## 2018-07-04 MED ORDER — SODIUM CHLORIDE 0.9% FLUSH: 3.0000 mL | INTRAVENOUS | Status: DC | PRN

## 2018-07-04 MED ORDER — OXYCODONE HCL 5 MG PO TABS: 5.0000 mg | ORAL_TABLET | ORAL | Status: DC | PRN

## 2018-07-04 NOTE — Progress Notes (Signed)
RN aware to DC central Line

## 2018-07-04 NOTE — Progress Notes (Signed)
Right IJ CVC DC'd without complication. Continue to monitor closely.

## 2018-07-04 NOTE — Discharge Summary (Signed)
[facility] VALVE TEAM  Discharge Summary    Patient ID: Christopher Rocha MRN: [id number]; DOB: 30-Mar-1941  Admit date: 07/02/2018 Discharge date: 07/07/2018  Primary Care Provider: [name], MD  Primary Cardiologist: Dr. Geraldo Pitter / Dr. Burt Knack & Dr. Cyndia Bent (TAVR)  Discharge Diagnoses    Principal Problem:   S/P TAVR (transcatheter aortic valve replacement) Active Problems:   Essential hypertension   Mixed dyslipidemia   CAD (coronary artery disease)   Hx of CABG   Severe aortic stenosis   Mass of upper lobe of right lung   COPD (chronic obstructive pulmonary disease) (HCC)   Acute on chronic diastolic heart failure (HCC) Probable bronchitis  Allergies Allergies  Allergen Reactions  . Penicillins Hives and Other (See Comments)    Has patient had a PCN reaction causing immediate rash, facial/tongue/throat swelling, SOB or lightheadedness with hypotension: No Has patient had a PCN reaction causing severe rash involving mucus membranes or skin necrosis: No Has patient had a PCN reaction that required hospitalization: No Has patient had a PCN reaction occurring within the last 10 years: No If all of the above answers are "NO", then may proceed with Cephalosporin use.     Diagnostic Studies/Procedures    CARDIOTHORACIC SURGERY OPERATIVE NOTE  Date of Procedure:                07/02/2018  Procedure:        Transcatheter Aortic Valve Replacement - Transapical Approach             Edwards Sapien XT THV (size 29 mm, model # 9300TFX, serial # [id number])              Co-Surgeons:                        [name], MD and Sherren Mocha, MD  Pre-operative Echo Findings: ?  severe aortic stenosis ? Normal left ventricular systolic function    Post-operative Echo Findings: ? Mild paravalvular leak ? Normal left ventricular systolic function  _____________   Echo 07/03/18:  Study Conclusions - Left  ventricle: The cavity size was normal. Wall thickness was   increased in a pattern of moderate LVH. Systolic function was   normal. The estimated ejection fraction was in the range of 55%   to 60%. - Aortic valve: Post TAVR with 29 mm Sapien 3 Trivial appearing   peri valvular regurgitation. Appears to be some flow acceleration   in diastole throubh mitral valve that is a bit hard to   differentiate from potential leak Gradients remain low with mean   4 mmHG peak 7 mmHg. - Mitral valve: Severely calcified annulus. Moderately thickened,   mildly calcified leaflets . There was mild regurgitation. - Left atrium: The atrium was moderately dilated.  History of Present Illness     Christopher Rocha is a 77 y.o. male with a history of CAD s/p CABG x2V (2014), HTN, HLD, lung mass (possible bronchogenic carcinoma), and severe AS who presented to [facility] on 07/[facility]18 for planned TAVR.  Mr. Millstein has developed severe symptomatic aortic stenosis. Mean transaortic gradient is 60 mmHg in the setting of normal LV systolic function. Cardiac catheterization demonstrated patency of his bypass grafts. 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 He has been evaluated by Dr Elsworth Soho with pulmonary and Dr Cyndia Bent with cardiothoracic surgery. We have all discussed his case extensively  and agree that it is indicated and appropriate to proceed with TAVR to relieve symptoms of critical aortic stenosis and allow for further workup and treatment of his lung mass, this was set up for 07/02/18 via the TA approach due to severe calcifications in peripheral vessels.   Hospital Course     Consultants: none  Severe AS: s/p successful TAVR with a 29 mm Edwards Sapien 3 THV via the TA approach on 07/02/18. Post operative echo showed EF55%, normally functioning TAVR with mean gradient of 4 mm Hg and trivial PVL. Groin site and chest wall site healing well. 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  Continue Asprin and plavix.   HTN: BP was initially high and home Hyzaar was resumed. He then developed AKI and this was discontinued and he was started on Lopressor 25mg  BID.   AKI: creat up to 2.5. This improved to 1.25  Leukocytosis: WBC decreased to 14,300. He is being treated for possible bronchitis  Acute on chronic diastolic CHF: as evidenced by an elevated BNP on preadmission labs and progressive symptoms of shortness of breath and decreased exercise tolerance. Post op CXR showed some pulmonary vascular congestion. Hopefully this will improve with TAVR  Lung mass: plan is for repeat CT can in December under the care of Dr. Elsworth Soho.   CAD: pre TAVR cath showed patent bypass grafts. Continue medical therapy.  Possible  bronchitis: treated with IV abx (Cefipime) per pharmacy recommendations. He will go home on Levaquin 500 mg orally daily _____________  Discharge Vitals Blood pressure (!) 151/54, pulse 66, temperature 97.7 F (36.5 C), temperature source Oral, resp. rate 19, weight 87.6 kg, SpO2 95 %.  Filed Weights   07/05/18 0600 07/06/18 0309 07/07/18 0511  Weight: 87.5 kg 89.1 kg 87.6 kg    Labs & Radiologic Studies    CBC Recent Labs    07/05/18 0325 07/06/18 0342  WBC 21.0* 14.3*  HGB 8.4* 8.6*  HCT 27.2* 27.7*  MCV 97.1 96.9  PLT 134* 443   Basic Metabolic Panel Recent Labs    07/06/18 0342 07/07/18 0409  NA 135 136  K 3.9 3.6  CL 105 106  CO2 25 23  GLUCOSE 143* 135*  BUN 69* 54*  CREATININE 1.88* 1.25*  CALCIUM 9.0 9.2   Liver Function Tests No results for input(s): AST, ALT, ALKPHOS, BILITOT, PROT, ALBUMIN in the last 72 hours. No results for input(s): LIPASE, AMYLASE in the last 72 hours. Cardiac Enzymes No results for input(s): CKTOTAL, CKMB, CKMBINDEX, TROPONINI in the last 72 hours. BNP Invalid input(s): POCBNP D-Dimer No results for input(s): DDIMER in the last 72 hours. Hemoglobin A1C No results for input(s): HGBA1C in the last 72  hours. Fasting Lipid Panel No results for input(s): CHOL, HDL, LDLCALC, TRIG, CHOLHDL, LDLDIRECT in the last 72 hours. Thyroid Function Tests No results for input(s): TSH, T4TOTAL, T3FREE, THYROIDAB in the last 72 hours.  Invalid input(s): FREET3 _____________  Dg Chest 2 View  Result Date: 07/05/2018 CLINICAL DATA:  Follow-up aortic valve replacement EXAM: CHEST - 2 VIEW COMPARISON:  07/03/2018 FINDINGS: Prior CABG and aortic valve repair. Right central line has been removed. Stable right upper lobe masslike consolidation. Left lower lobe airspace opacity concerning for pneumonia. Underlying COPD. No effusions or acute bony abnormality. IMPRESSION: Left lower lobe airspace opacity concerning for pneumonia. Right upper lobe mass unchanged. Cardiomegaly, COPD. Electronically Signed   By: Rolm Baptise M.D.   On: 07/05/2018 09:22   Dg  Chest 2 View  Result Date: 06/28/2018 CLINICAL DATA:  Severe aortic stenosis.  Pre-admission evaluation. EXAM: CHEST - 2 VIEW COMPARISON:  Chest radiograph 05/31/2018 and PET-CT 06/13/2018 FINDINGS: Persistent densities in the right upper lobe have not significantly changed. Left lung remains clear. Heart size is within normal limits with median sternotomy wires. Negative for pneumothorax. No large pleural effusions. Multilevel degenerative endplate disease in the thoracic spine. IMPRESSION: No acute cardiopulmonary disease. Persistent parenchymal densities in the right upper lung that have been present since 05/02/2018. Refer to the recent PET-CT on 06/13/2018 for additional characterization. Electronically Signed   By: Markus Daft M.D.   On: 06/28/2018 15:25   Nm Pet Image Initial (pi) Skull Base To Thigh  Result Date: 06/13/2018 CLINICAL DATA:  Initial treatment strategy for RIGHT upper lobe mass. EXAM: NUCLEAR MEDICINE PET SKULL BASE TO THIGH TECHNIQUE: 9.9 mCi F-18 FDG was injected intravenously. Full-ring PET imaging was performed from the skull base to thigh  after the radiotracer. CT data was obtained and used for attenuation correction and anatomic localization. Fasting blood glucose: 115 mg/dl COMPARISON:  CT 05/30/2018 FINDINGS: Mediastinal blood pool activity: SUV max 2.65 NECK: No hypermetabolic lymph nodes in the neck. Incidental CT findings: none CHEST: Focus of consolidation in the RIGHT upper lobe is little changed over the 2 week interval from comparison CT measuring 33 x 28 mm compared with 33 by 29 mm. Lesion is hypermetabolic. There is variable metabolic activity with the most intense activity inferior medially with SUV max equal 5.4. There is a focus of consolidation/atelectasis in the inferior LEFT lower lobe laterally is also similar to comparison exam measuring 21 mm (image 65/8) which also has metabolic activity with SUV max equal 5.4. No additional suspicious nodules or masses. No hypermetabolic mediastinal lymph nodes. No supraclavicular adenopathy. Incidental CT findings: none ABDOMEN/PELVIS: There is hypermetabolic activity associated with a hiatal hernia which is favored physiologic. No abnormal activity in the liver. No hypermetabolic upper abdominal lymph nodes. No pelvic lymphadenopathy. No abnormality of the bowel. Incidental CT findings: Large LEFT renal cysts. SKELETON: No focal hypermetabolic activity to suggest skeletal metastasis. Incidental CT findings: none IMPRESSION: 1. RIGHT upper lobe consolidation with associated hypermetabolic activity is not changed in 2 week interval. 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 Consider bronchoscopy for tissue sampling. The inferior medial aspect of the lesion is most intense. 2. No evidence of metastatic mediastinal lymphadenopathy. 3. Second post focus of metabolic activity associated atelectasis / consolidation in the LEFT lower lobe is indeterminate. Recommend close attention on follow-up. This lesion would  be difficult to sample. Electronically Signed   By: Suzy Bouchard M.D.   On: 06/13/2018 11:11   Dg Chest Port 1 View  Result Date: 07/03/2018 CLINICAL DATA:  Status post TAVR EXAM: PORTABLE CHEST 1 VIEW COMPARISON:  07/02/2018 FINDINGS: Cardiac shadow is stable. Aortic valve replacement is noted. Right jugular central line is again seen. Postoperative changes are noted and stable. Right upper lobe mass lesion is again seen and stable. No pneumothorax is noted. IMPRESSION: Stable right upper lobe mass Tubes and lines as described stable from the previous exam. No focal infiltrate is seen. Electronically Signed   By: Inez Catalina M.D.   On: 07/03/2018 10:46   Dg Chest Port 1 View  Result Date: 07/02/2018 CLINICAL DATA:  Status post transcatheter aortic valve replacement. EXAM: PORTABLE CHEST 1 VIEW COMPARISON:  06/28/2018 FINDINGS: Sequelae of interval transcatheter aortic valve replacement  are identified. A right jugular catheter terminates over the upper SVC. The cardiac silhouette is upper limits of normal in size. Right upper lobe opacity is unchanged and corresponds to masslike consolidation on 06/13/2018 PET-CT. There is mild central pulmonary vascular congestion without overt edema. A trace left pleural effusion is not excluded. No pneumothorax is identified. IMPRESSION: 1. Interval transcatheter aortic valve replacement. 2. Mild pulmonary vascular congestion and possible trace left pleural effusion. 3. Unchanged right upper lobe density more fully evaluated on prior PET-CT. Electronically Signed   By: Logan Bores M.D.   On: 07/02/2018 13:11   Disposition   Pt is being discharged home today in good condition.  Follow-up Plans & Appointments    Follow-up Information    Eileen Stanford, PA-C. Go on 07/11/2018.   Specialties:  Cardiology, Radiology Why:  @ 3:30pm, please arrive at least 10 minutes early. Contact information: [address] [place]  [id number] [phone]            Discharge Medications   Allergies as of 07/07/2018      Reactions   Penicillins Hives, Other (See Comments)   Has patient had a PCN reaction causing immediate rash, facial/tongue/throat swelling, SOB or lightheadedness with hypotension: No Has patient had a PCN reaction causing severe rash involving mucus membranes or skin necrosis: No Has patient had a PCN reaction that required hospitalization: No Has patient had a PCN reaction occurring within the last 10 years: No If all of the above answers are "NO", then may proceed with Cephalosporin use.      Medication List    STOP taking these medications   losartan-hydrochlorothiazide 100-12.5 MG tablet Commonly known as:  HYZAAR   nitroGLYCERIN 0.4 MG SL tablet Commonly known as:  NITROSTAT     TAKE these medications   acetaminophen 325 MG tablet Commonly known as:  TYLENOL Take 2 tablets (650 mg total) by mouth every 6 (six) hours as needed for mild pain.   aspirin EC 81 MG tablet Take 81 mg by mouth every other day.   atorvastatin 80 MG tablet Commonly known as:  LIPITOR Take 80 mg by mouth daily.   clopidogrel 75 MG tablet Commonly known as:  PLAVIX Take 1 tablet (75 mg total) by mouth daily with breakfast.   guaiFENesin 600 MG 12 hr tablet Commonly known as:  MUCINEX Take 1 tablet (600 mg total) by mouth 2 (two) times daily as needed for cough or to loosen phlegm.   ibuprofen 200 MG tablet Commonly known as:  ADVIL,MOTRIN Take 400 mg by mouth every 6 (six) hours as needed for headache or moderate pain.   levofloxacin 500 MG tablet Commonly known as:  LEVAQUIN Take 1 tablet (500 mg total) by mouth daily for 7 days.   metoprolol tartrate 25 MG tablet Commonly known as:  LOPRESSOR Take 1 tablet (25 mg total) by mouth 2 (two) times daily.   REDNESS RELIEF OP Place 1 drop into both eyes daily as needed (for redness/itching).   traMADol 50 MG tablet Commonly known as:   ULTRAM Take 50 mg by mouth every 4-6 hours PRN severe pain           Outstanding Labs/Studies   none  Duration of Discharge Encounter   Greater than 30 minutes including physician time.  Signed, Nani Skillern, PA-C 07/07/2018, 12:53 PM [phone]

## 2018-07-04 NOTE — Progress Notes (Signed)
  [facility]   MULTIDISCIPLINARY HEART VALVE TEAM   Called by RN to review tele. Patient felt to be possibly in afib. I have reviewed all ECGs and telemetry. I do not see anything that is definitely atrial fibrillation. He has a LBBB (which is old) and long first degree block with PACs. I have asked EP to review telemetry and ECG as well. If it is decided that he does have afib, we will have to have a discussion about oral anticoagulation with Dr. Cyndia Bent.    Angelena Form PA-C  [facility]

## 2018-07-04 NOTE — Progress Notes (Signed)
CARDIAC REHAB PHASE I   PRE:  Rate/Rhythm: 100 afib    BP: sitting 126/56    SaO2: 98 RA  MODE:  Ambulation: 470 ft   POST:  Rate/Rhythm: 112 afib    BP: sitting 117/68     SaO2: 100 RA  Pt asleep in bed on arrival, in controlled afib (NSR previously). He is groggy this am and had a hard time understanding the sequence of getting out of bed. Did not remember walking in am. Used RW and gait belt in hall. Steady, no major c/o but still somewhat "off" mentally. Remained in afib. To recliner after walk. Pt and wife st he has not had afib in the past that they are aware of. [phone]  [place], ACSM 07/04/2018 10:35 AM

## 2018-07-04 NOTE — Progress Notes (Signed)
2 Days Post-Op Procedure(s) (LRB): TRANSCATHETER AORTIC VALVE REPLACEMENT, TRANSAPICAL (N/A) TRANSESOPHAGEAL ECHOCARDIOGRAM (TEE) Subjective:  Sore but better than yesterday.  Objective: Vital signs in last 24 hours: Temp:  [97.8 F (36.6 C)-98.9 F (37.2 C)] 98.9 F (37.2 C) (10/31 0400) Pulse Rate:  [66-91] 91 (10/31 0700) Cardiac Rhythm: Heart block (10/30 0800) Resp:  [15-31] 18 (10/31 0700) BP: (106-141)/(44-96) 125/53 (10/31 0700) SpO2:  [89 %-98 %] 98 % (10/31 0700) Weight:  [88.2 kg] 88.2 kg (10/31 0500)  Hemodynamic parameters for last 24 hours:    Intake/Output from previous day: 10/30 0701 - 10/31 0700 In: 270 [P.O.:120; IV Piggyback:150] Out: 500 [Urine:500] Intake/Output this shift: No intake/output data recorded.  General appearance: alert and cooperative Heart: regular rate and rhythm, S1, S2 normal, no murmur, click, rub or gallop Lungs: few rhonchi Wound: incision ok  Lab Results: Recent Labs    07/03/18 0301 07/04/18 0422  WBC 19.1* 25.5*  HGB 10.0* 9.6*  HCT 32.0* 30.7*  PLT 180 135*   BMET:  Recent Labs    07/03/18 0301 07/04/18 0422  NA 136 134*  K 4.7 4.3  CL 103 103  CO2 25 26  GLUCOSE 164* 155*  BUN 21 38*  CREATININE 1.13 1.58*  CALCIUM 9.0 9.2    PT/INR: No results for input(s): LABPROT, INR in the last 72 hours. ABG    Component Value Date/Time   PHART 7.399 06/28/2018 1023   HCO3 25.1 06/28/2018 1023   TCO2 27 05/08/2018 1619   ACIDBASEDEF 1.0 05/08/2018 1615   O2SAT 93.6 06/28/2018 1023   CBG (last 3)  No results for input(s): GLUCAP in the last 72 hours.  Assessment/Plan: S/P Procedure(s) (LRB): TRANSCATHETER AORTIC VALVE REPLACEMENT, TRANSAPICAL (N/A) TRANSESOPHAGEAL ECHOCARDIOGRAM (TEE)  POD 2 Hemodynamically stable. Creatinine jumped up today and may be due to contrast and resuming Losartan. Will hold Losartan and HCTZ for now and add Lopressor for BP and HR control. Repeat BMET in am.  Leukocytosis  postop. Could be inflammatory. Continue IS, add flutter valve. Repeat in am.  DC central line  2D echo not read yet. May be small paravalvular leak. Mean gradient 3-4 mm Hg.   Transfer to 4E and continue mobilization. Home when moving around well.    LOS: 2 days    [name] 07/04/2018

## 2018-07-05 ENCOUNTER — Inpatient Hospital Stay (HOSPITAL_COMMUNITY): Payer: Medicare Other

## 2018-07-05 LAB — BASIC METABOLIC PANEL
Anion gap: 8 (ref 5–15)
BUN: 62 mg/dL — AB (ref 8–23)
CHLORIDE: 101 mmol/L (ref 98–111)
CO2: 25 mmol/L (ref 22–32)
Calcium: 9.2 mg/dL (ref 8.9–10.3)
Creatinine, Ser: 2.32 mg/dL — ABNORMAL HIGH (ref 0.61–1.24)
GFR calc Af Amer: 30 mL/min — ABNORMAL LOW (ref >60)
GFR calc non Af Amer: 25 mL/min — ABNORMAL LOW (ref >60)
GLUCOSE: 149 mg/dL — AB (ref 70–99)
POTASSIUM: 3.9 mmol/L (ref 3.5–5.1)
SODIUM: 134 mmol/L — AB (ref 135–145)

## 2018-07-05 LAB — CBC
HCT: 27.2 % — ABNORMAL LOW (ref 39.0–52.0)
HEMOGLOBIN: 8.4 g/dL — AB (ref 13.0–17.0)
MCH: 30 pg (ref 26.0–34.0)
MCHC: 30.9 g/dL (ref 30.0–36.0)
MCV: 97.1 fL (ref 80.0–100.0)
Platelets: 134 10*3/uL — ABNORMAL LOW (ref 150–400)
RBC: 2.8 MIL/uL — AB (ref 4.22–5.81)
RDW: 13.7 % (ref 11.5–15.5)
WBC: 21 10*3/uL — ABNORMAL HIGH (ref 4.0–10.5)
nRBC: 0 % (ref 0.0–0.2)

## 2018-07-05 LAB — MRSA PCR SCREENING: MRSA by PCR: NEGATIVE

## 2018-07-05 MED ORDER — VANCOMYCIN HCL IN DEXTROSE 1-5 GM/200ML-% IV SOLN
1000.0000 mg | INTRAVENOUS | Status: DC
  Filled 2018-07-05: qty 200

## 2018-07-05 MED ORDER — SODIUM CHLORIDE 0.9 % IV SOLN
1.0000 g | INTRAVENOUS | Status: DC
  Administered 2018-07-05 – 2018-07-06 (×2): 1 g via INTRAVENOUS
  Filled 2018-07-05 (×3): qty 1

## 2018-07-05 MED ORDER — SODIUM CHLORIDE 0.9 % IV SOLN
INTRAVENOUS | Status: AC
  Administered 2018-07-05: 09:00:00 via INTRAVENOUS

## 2018-07-05 MED ORDER — VANCOMYCIN HCL 10 G IV SOLR
1500.0000 mg | Freq: Once | INTRAVENOUS | Status: AC
  Administered 2018-07-05: 1500 mg via INTRAVENOUS
  Filled 2018-07-05: qty 1500

## 2018-07-05 NOTE — Care Management Important Message (Signed)
Important Message  Patient Details  Name: BANDON SHERWIN MRN: [id number] Date of Birth: 1941-04-02   Medicare Important Message Given:  Yes    Barb Merino Cresson 07/05/2018, 3:59 PM

## 2018-07-05 NOTE — Progress Notes (Addendum)
[place] VALVE TEAM  Patient Name: Christopher Rocha Date of Encounter: 07/05/2018  Primary Cardiologist: Dr. Geraldo Pitter / Dr. Burt Knack & Dr. Cyndia Bent (TAVR)  Hospital Problem List     Principal Problem:   S/P TAVR (transcatheter aortic valve replacement) Active Problems:   Essential hypertension   Mixed dyslipidemia   CAD (coronary artery disease)   Hx of CABG   Severe aortic stenosis   Mass of upper lobe of right lung   COPD (chronic obstructive pulmonary disease) (HCC)   Acute on chronic diastolic heart failure (HCC)     Subjective   No complaints. Coughing up white sputum. Chest only hurts when he coughs  Inpatient Medications    Scheduled Meds: . aspirin EC  81 mg Oral Daily  . atorvastatin  80 mg Oral Daily  . clopidogrel  75 mg Oral Q breakfast  . docusate sodium  200 mg Oral Daily  . metoprolol tartrate  25 mg Oral BID  . sodium chloride flush  3 mL Intravenous Q12H   Continuous Infusions: . sodium chloride     PRN Meds: sodium chloride, acetaminophen, bisacodyl **OR** bisacodyl, hydrALAZINE, ondansetron **OR** ondansetron (ZOFRAN) IV, oxyCODONE, sodium chloride flush, traMADol   Vital Signs    Vitals:   07/04/18 1109 07/04/18 2313 07/05/18 0550 07/05/18 0600  BP: 117/68 106/63 (!) 132/53   Pulse: (!) 101 81 81   Resp:  16 (!) 28 (!) 23  Temp:  99 F (37.2 C) 98 F (36.7 C)   TempSrc:  Oral Oral   SpO2:  93% 95%   Weight:    87.5 kg    Intake/Output Summary (Last 24 hours) at 07/05/2018 0718 Last data filed at 07/05/2018 0500 Gross per 24 hour  Intake 740 ml  Output 300 ml  Net 440 ml   Filed Weights   07/03/18 0600 07/04/18 0500 07/05/18 0600  Weight: 89.8 kg 88.2 kg 87.5 kg    Physical Exam   GEN: Well nourished, well developed, in no acute distress.  HEENT: Grossly normal.  Neck: Supple, no JVD, carotid bruits, or masses. Cardiac: RRR, no murmurs, rubs, or gallops. No clubbing, cyanosis, edema.   Radials/DP/PT 2+ and equal bilaterally.  Respiratory:  Few rhonchi GI: Soft, nontender, nondistended, BS + x 4. MS: no deformity or atrophy. Skin: warm and dry, no rash. Incision healing well Neuro:  Strength and sensation are intact. Psych: AAOx3.  Normal affect.  Labs    CBC Recent Labs    07/04/18 0422 07/05/18 0325  WBC 25.5* 21.0*  HGB 9.6* 8.4*  HCT 30.7* 27.2*  MCV 97.8 97.1  PLT 135* 850*   Basic Metabolic Panel Recent Labs    07/03/18 0301 07/04/18 0422 07/05/18 0325  NA 136 134* 134*  K 4.7 4.3 3.9  CL 103 103 101  CO2 25 26 25   GLUCOSE 164* 155* 149*  BUN 21 38* 62*  CREATININE 1.13 1.58* 2.32*  CALCIUM 9.0 9.2 9.2  MG 1.5* 2.2  --    Liver Function Tests No results for input(s): AST, ALT, ALKPHOS, BILITOT, PROT, ALBUMIN in the last 72 hours. No results for input(s): LIPASE, AMYLASE in the last 72 hours. Cardiac Enzymes No results for input(s): CKTOTAL, CKMB, CKMBINDEX, TROPONINI in the last 72 hours. BNP Invalid input(s): POCBNP D-Dimer No results for input(s): DDIMER in the last 72 hours. Hemoglobin A1C No results for input(s): HGBA1C in the last 72 hours. Fasting Lipid Panel No results for input(s):  CHOL, HDL, LDLCALC, TRIG, CHOLHDL, LDLDIRECT in the last 72 hours. Thyroid Function Tests No results for input(s): TSH, T4TOTAL, T3FREE, THYROIDAB in the last 72 hours.  Invalid input(s): FREET3  Telemetry     sinus with PACs and first deg AV block- Personally Reviewed  ECG    Possibly afib HR 101 vs sinus with long first deg AV block and PACs - Personally Reviewed  Radiology    No results found.  Cardiac Studies   CARDIOTHORACIC SURGERY OPERATIVE NOTE  Date of Procedure:07/02/2018  Procedure:   Transcatheter Aortic Valve Replacement - Transapical Approach Edwards Sapien XT THV (size 20mm, model # 9300TFX, serial # [id number])  Co-Surgeons:Bryan Alveria Apley, MD and Sherren Mocha, MD  Pre-operative Echo Findings: ? severe aortic stenosis ? Normalleft ventricular systolic function    Post-operative Echo Findings: ? Mildparavalvular leak ? Normalleft ventricular systolic function  _____________   Echo 07/03/18:  Study Conclusions - Left ventricle: The cavity size was normal. Wall thickness was increased in a pattern of moderate LVH. Systolic function was normal. The estimated ejection fraction was in the range of 55% to 60%. - Aortic valve: Post TAVR with 29 mm Sapien 3 Trivial appearing peri valvular regurgitation. Appears to be some flow acceleration in diastole throubh mitral valve that is a bit hard to differentiate from potential leak Gradients remain low with mean 4 mmHG peak 7 mmHg. - Mitral valve: Severely calcified annulus. Moderately thickened, mildly calcified leaflets . There was mild regurgitation. - Left atrium: The atrium was moderately dilated.  Patient Profile     Christopher Rocha is a 77 y.o. male with a history of CAD s/p CABG x2V (2014), HTN, HLD, lung mass (possible bronchogenic carcinoma), and severe AS who presented to [facility] on 07/02/18 for planned TAVR.  Assessment & Plan    Severe AS:s/p successful TAVR with a 29 mm Edwards Sapien 3 THV via the TA approach on 07/02/18. Post operative echo showed EF 55%, normally functioning TAVR with mean gradient of 4 mm Hg and trivial PVL. Groin site and chest wall site healing well. 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 Continue Asprin and plavix.   HTN: BP was initially high and home Hyzaar was resumed. He then developed AKI and this was discontinued and he was started on Lopressor 25mg  BID.   AKI: creat up to 2.32 today. Continue to hold nephrotoxic agents. I will start IV hydration.   Leukocytosis: felt to be inflammatory. Improving WBC 25.5--> 21.0.  Acute on chronic diastolic CHF: as evidenced by an elevated BNP on preadmission labs and  progressive symptoms of shortness of breath and decreased exercise tolerance. Post op CXR showed some pulmonary vascular congestion. Hopefully this will improve with TAVR. Hold all diuretics with AKI and start light fluids.  Lung mass: plan is for repeat CT can in December under the care of Dr. Elsworth Soho.   CAD: pre TAVR cath showed patent bypass grafts. Continue medical therapy.  Arrhythmia: Called by RN to review tele yesterday. Patient felt to be possibly in afib. I have reviewed all ECGs and telemetry. I do not see anything that is definitely atrial fibrillation. He has a LBBB (which is old) and long first degree block with PACs. ECG last night looks more like afib but review of tele clearly shows sinus with PACs. He would not be a candidate for oral anticoagulation anyway.   Cough: productive white sputum. CXR shows new left lower lobe infiltrate. He has elevated white  count and mild temperature. Will talk to pharmacy to see what Abx they would recommend.   Signed, Angelena Form, PA-C  07/05/2018, 7:18 AM  Pager [phone]

## 2018-07-05 NOTE — Progress Notes (Signed)
Pharmacy Antibiotic Note  Christopher Rocha is a 77 y.o. male admitted on 07/02/2018 with CXR suggesting pneumonia.  Pharmacy has been consulted for vancomycin/cefepime dosing. Afebrile, WBC down to 21. SCr up to 2.32 today (1.13 on admit), CrCl~25-30.  Noted "hives, other" reaction to PCN documented but patient answered "No" to all allergy questions that would prevent him from receiving a cephalosporin. Received vancomycin/levaquin x 2 doses each on 10/29-30 for surgical prophylaxis for TAVR.  MD to order MRSA pcr to consider early d/c of vancomycin if negative.  Plan: Cefepime 1g IV q24h Vancomycin 1500mg  IV x1; then 1g IV q24h Monitor clinical progress, c/s, renal function F/u de-escalation plan/LOT, vancomycin trough as indicated   Weight: 192 lb 14.4 oz (87.5 kg)  Temp (24hrs), Avg:98.4 F (36.9 C), Min:97.4 F (36.3 C), Max:99.2 F (37.3 C)  Recent Labs  Lab 07/03/18 0301 07/04/18 0422 07/05/18 0325  WBC 19.1* 25.5* 21.0*  CREATININE 1.13 1.58* 2.32*    Estimated Creatinine Clearance: 28.4 mL/min (A) (by C-G formula based on SCr of 2.32 mg/dL (H)).    Allergies  Allergen Reactions  . Penicillins Hives and Other (See Comments)    Has patient had a PCN reaction causing immediate rash, facial/tongue/throat swelling, SOB or lightheadedness with hypotension: No Has patient had a PCN reaction causing severe rash involving mucus membranes or skin necrosis: No Has patient had a PCN reaction that required hospitalization: No Has patient had a PCN reaction occurring within the last 10 years: No If all of the above answers are "NO", then may proceed with Cephalosporin use.     Antimicrobials this admission: 10/29; 11/1 vancomycin >>  11/1 cefepime >>  10/29 levaquin for surgical ppx >> 10/30  Dose adjustments this admission:   Microbiology results: 11/1 mrsa pcr - pending  Elicia Lamp, PharmD, BCPS Clinical Pharmacist 07/05/2018 11:38 AM

## 2018-07-05 NOTE — Progress Notes (Signed)
CARDIAC REHAB PHASE I   PRE:  Rate/Rhythm: 69 LBBB with many PACs    BP: sitting 99/50    SaO2: 94 RA  MODE:  Ambulation: 470 ft   POST:  Rate/Rhythm: 84 LBBB with PACs    BP: sitting 142/66      SaO2: 95 RA   Pt walked 940 ft earlier this am. Able to walk without RW today, steady. He is congested but couldn't produce any sputum with me. He is not eating. Discussed ed with pt and wife. Will refer to [place]. Encouraged IS (1300 mL) and more walking today. [phone]  [place], ACSM 07/05/2018 11:11 AM

## 2018-07-06 LAB — CBC
HEMATOCRIT: 27.7 % — AB (ref 39.0–52.0)
Hemoglobin: 8.6 g/dL — ABNORMAL LOW (ref 13.0–17.0)
MCH: 30.1 pg (ref 26.0–34.0)
MCHC: 31 g/dL (ref 30.0–36.0)
MCV: 96.9 fL (ref 80.0–100.0)
NRBC: 0 % (ref 0.0–0.2)
Platelets: 154 10*3/uL (ref 150–400)
RBC: 2.86 MIL/uL — AB (ref 4.22–5.81)
RDW: 13.6 % (ref 11.5–15.5)
WBC: 14.3 10*3/uL — AB (ref 4.0–10.5)

## 2018-07-06 LAB — BASIC METABOLIC PANEL
ANION GAP: 5 (ref 5–15)
BUN: 69 mg/dL — AB (ref 8–23)
CO2: 25 mmol/L (ref 22–32)
Calcium: 9 mg/dL (ref 8.9–10.3)
Chloride: 105 mmol/L (ref 98–111)
Creatinine, Ser: 1.88 mg/dL — ABNORMAL HIGH (ref 0.61–1.24)
GFR calc non Af Amer: 33 mL/min — ABNORMAL LOW (ref >60)
GFR, EST AFRICAN AMERICAN: 38 mL/min — AB (ref >60)
Glucose, Bld: 143 mg/dL — ABNORMAL HIGH (ref 70–99)
POTASSIUM: 3.9 mmol/L (ref 3.5–5.1)
Sodium: 135 mmol/L (ref 135–145)

## 2018-07-06 MED ORDER — GUAIFENESIN ER 600 MG PO TB12
600.0000 mg | ORAL_TABLET | Freq: Two times a day (BID) | ORAL | Status: DC
  Administered 2018-07-06 – 2018-07-07 (×3): 600 mg via ORAL
  Filled 2018-07-06 (×3): qty 1

## 2018-07-06 NOTE — Progress Notes (Signed)
CARDIAC REHAB PHASE I   PRE:  Rate/Rhythm: 75 SR    BP: sitting 152/66    SaO2: 95 RA  MODE:  Ambulation: 470 ft   POST:  Rate/Rhythm: 75 SR iwht PAC    BP: sitting 154/53     SaO2: 96 RA  Pt not feeling well today. Slightly wobbly walking, sts he is exhausted after walk. To recliner, VSS. He was able to drink a whole Ensure. Congested cough, nonproductive with me. Practiced IS x3. [place], ACSM 07/06/2018 2:17 PM

## 2018-07-06 NOTE — Progress Notes (Addendum)
      [address]       [place] [address]             [phone]        4 Days Post-Op Procedure(s) (LRB): TRANSCATHETER AORTIC VALVE REPLACEMENT, TRANSAPICAL (N/A) TRANSESOPHAGEAL ECHOCARDIOGRAM (TEE)  Subjective: Patient sitting in chair. Has complaints of coughing a lot and no appetite. He states he has not had an appetite in years.  Objective: Vital signs in last 24 hours: Temp:  [97.4 F (36.3 C)-99 F (37.2 C)] 98.6 F (37 C) (11/02 0410) Pulse Rate:  [66-80] 72 (11/02 0410) Cardiac Rhythm: Normal sinus rhythm;Heart block (11/02 0700) Resp:  [20-25] 20 (11/02 0410) BP: (94-144)/(50-64) 144/64 (11/02 0410) SpO2:  [92 %-93 %] 93 % (11/02 0410) Weight:  [89.1 kg] 89.1 kg (11/02 0309)   Current Weight  07/06/18 89.1 kg       Intake/Output from previous day: 11/01 0701 - 11/02 0700 In: 487 [I.V.:487] Out: -    Physical Exam:  Cardiovascular: RRR Pulmonary: Coarse breath sounds Abdomen: Soft, non tender, bowel sounds present. Extremities: Trace bilateral lower extremity edema. Wound: Left anterior/lateral chest wound is clean and dry.  No erythema or signs of infection.  Lab Results: CBC: Recent Labs    07/05/18 0325 07/06/18 0342  WBC 21.0* 14.3*  HGB 8.4* 8.6*  HCT 27.2* 27.7*  PLT 134* 154   BMET:  Recent Labs    07/05/18 0325 07/06/18 0342  NA 134* 135  K 3.9 3.9  CL 101 105  CO2 25 25  GLUCOSE 149* 143*  BUN 62* 69*  CREATININE 2.32* 1.88*  CALCIUM 9.2 9.0    PT/INR:  Lab Results  Component Value Date   INR 1.03 06/28/2018   ABG:  INR: Will add last result for INR, ABG once components are confirmed Will add last 4 CBG results once components are confirmed  Assessment/Plan:  1. CV - SR, first degree heart block. On Lopressor 25 mg bid and Plavix 75 mg daily.SBP starting to increase into the 140's Will NOT restart Hyzaar as still with AKI (although creatinine improving).  Monitor BP for now. 2.  Pulmonary  - On room air. Encourage incentive spirometer. Mucinex for cough. Of note, he has aright upper lobe lung mass which will be followed up with CT under Dr. Bari Mantis care 3. AKI-creatinine decreased from 2.32 to 1.88. Re check in am 4.  Acute blood loss anemia - H and H 8.6 and 27.7 this am 5. Leukocytosis-WBC decreased from 21,000 to 14,300. On Vancomycin for LLL opacity (?PNA) 6. GI-encourage oral intake. He refuses Ensure  Donielle M ZimmermanPA-C 07/06/2018,9:20 AM [phone]  Chart reviewed, patient examined, agree with above. He has been stable. Eating a little, ambulating. Bowels working. Wife is here today with him.  He was put on Vanc and Maxipime for possible LLL infiltrate. I was not impressed by that on CXR. I would not use Vanc given his recent worsening kidney function. Will continue Maxipime and plan to send home on oral antibiotic for a week in case he has bronchitis. He is still coughing up some sputum.  Renal function is improved today and will check tomorrow. If creat continues to improve he may be able to go home tomorrow.

## 2018-07-07 LAB — BASIC METABOLIC PANEL
Anion gap: 7 (ref 5–15)
BUN: 54 mg/dL — ABNORMAL HIGH (ref 8–23)
CALCIUM: 9.2 mg/dL (ref 8.9–10.3)
CO2: 23 mmol/L (ref 22–32)
CREATININE: 1.25 mg/dL — AB (ref 0.61–1.24)
Chloride: 106 mmol/L (ref 98–111)
GFR calc Af Amer: >60 mL/min (ref >60)
GFR, EST NON AFRICAN AMERICAN: 54 mL/min — AB (ref >60)
Glucose, Bld: 135 mg/dL — ABNORMAL HIGH (ref 70–99)
POTASSIUM: 3.6 mmol/L (ref 3.5–5.1)
SODIUM: 136 mmol/L (ref 135–145)

## 2018-07-07 MED ORDER — METOPROLOL TARTRATE 25 MG PO TABS: 25.0000 mg | ORAL_TABLET | Freq: Two times a day (BID) | ORAL | 1 refills | Status: DC

## 2018-07-07 MED ORDER — GUAIFENESIN ER 600 MG PO TB12: 600.0000 mg | ORAL_TABLET | Freq: Two times a day (BID) | ORAL | Status: DC | PRN

## 2018-07-07 MED ORDER — ACETAMINOPHEN 325 MG PO TABS: 650.0000 mg | ORAL_TABLET | Freq: Four times a day (QID) | ORAL | Status: DC | PRN

## 2018-07-07 MED ORDER — LEVOFLOXACIN 500 MG PO TABS: 500.0000 mg | ORAL_TABLET | Freq: Every day | ORAL | 0 refills | Status: DC

## 2018-07-07 MED ORDER — CLOPIDOGREL BISULFATE 75 MG PO TABS: 75.0000 mg | ORAL_TABLET | Freq: Every day | ORAL | 1 refills | Status: DC

## 2018-07-07 MED ORDER — SODIUM CHLORIDE 0.9 % IV SOLN
1.0000 g | Freq: Two times a day (BID) | INTRAVENOUS | Status: DC
  Filled 2018-07-07: qty 1

## 2018-07-07 MED ORDER — TRAMADOL HCL 50 MG PO TABS: ORAL_TABLET | ORAL | 0 refills | Status: DC

## 2018-07-07 NOTE — Progress Notes (Signed)
Discharge instructions reviewed with pt and his wife. Questions answered to their satisfaction. Both pt and his wife verbalized understanding. Pt discharged home via personal vehicle.

## 2018-07-07 NOTE — Progress Notes (Signed)
Pharmacy Antibiotic Note  Christopher Rocha is a 77 y.o. male admitted on 07/02/2018 with CXR suggesting pneumonia.  Pharmacy has been consulted for vancomycin/cefepime dosing.  -Afebrile, WBC down to 14.3. SCr down to 1.25, CrCl ~ 50 -plans noted for oral antibiotics soon    Plan: -Change cefepime to 1gm IV q12h -Will follow renal function and clinical progress    Weight: 193 lb 3.2 oz (87.6 kg)(Scale B)  Temp (24hrs), Avg:98.3 F (36.8 C), Min:97.7 F (36.5 C), Max:99.2 F (37.3 C)  Recent Labs  Lab 07/03/18 0301 07/04/18 0422 07/05/18 0325 07/06/18 0342 07/07/18 0409  WBC 19.1* 25.5* 21.0* 14.3*  --   CREATININE 1.13 1.58* 2.32* 1.88* 1.25*    Estimated Creatinine Clearance: 52.7 mL/min (A) (by C-G formula based on SCr of 1.25 mg/dL (H)).    Allergies  Allergen Reactions  . Penicillins Hives and Other (See Comments)    Has patient had a PCN reaction causing immediate rash, facial/tongue/throat swelling, SOB or lightheadedness with hypotension: No Has patient had a PCN reaction causing severe rash involving mucus membranes or skin necrosis: No Has patient had a PCN reaction that required hospitalization: No Has patient had a PCN reaction occurring within the last 10 years: No If all of the above answers are "NO", then may proceed with Cephalosporin use.     Antimicrobials this admission: 10/29; 11/1 vancomycin >> 11/2 11/1 cefepime >>    Dose adjustments this admission:   Microbiology results: 11/1 mrsa pcr - neg  Hildred Laser, PharmD Clinical Pharmacist **Pharmacist phone directory can now be found on [email or web address] ([email or web address]).  Listed under [place].

## 2018-07-07 NOTE — Progress Notes (Addendum)
      [address]       [place] [address]             [phone]        5 Days Post-Op Procedure(s) (LRB): TRANSCATHETER AORTIC VALVE REPLACEMENT, TRANSAPICAL (N/A) TRANSESOPHAGEAL ECHOCARDIOGRAM (TEE)  Subjective: Patient sitting in chair. Coughing and able to expectorate sputum. He want to go home.  Objective: Vital signs in last 24 hours: Temp:  [97.7 F (36.5 C)-99.2 F (37.3 C)] 97.7 F (36.5 C) (11/03 0511) Pulse Rate:  [68-78] 68 (11/03 0511) Cardiac Rhythm: Heart block (11/03 0000) Resp:  [16-26] 21 (11/03 0600) BP: (133-154)/(53-61) 137/61 (11/03 0511) SpO2:  [90 %-96 %] 96 % (11/03 0511) Weight:  [87.6 kg] 87.6 kg (11/03 0511)   Current Weight  07/07/18 87.6 kg       Intake/Output from previous day: 11/02 0701 - 11/03 0700 In: 550 [P.O.:440; I.V.:10; IV Piggyback:100] Out: -    Physical Exam:  Cardiovascular: RRR Pulmonary: Coarse breath sounds Abdomen: Soft, non tender, bowel sounds present. Extremities: No lower extremity edema. Wound: Left anterior/lateral chest wound is clean and dry.  No erythema or signs of infection. Chest tube site NOT draining.  Lab Results: CBC: Recent Labs    07/05/18 0325 07/06/18 0342  WBC 21.0* 14.3*  HGB 8.4* 8.6*  HCT 27.2* 27.7*  PLT 134* 154   BMET:  Recent Labs    07/06/18 0342 07/07/18 0409  NA 135 136  K 3.9 3.6  CL 105 106  CO2 25 23  GLUCOSE 143* 135*  BUN 69* 54*  CREATININE 1.88* 1.25*  CALCIUM 9.0 9.2    PT/INR:  Lab Results  Component Value Date   INR 1.03 06/28/2018   ABG:  INR: Will add last result for INR, ABG once components are confirmed Will add last 4 CBG results once components are confirmed  Assessment/Plan:  1. CV - SR, first degree heart block. On Lopressor 25 mg bid and Plavix 75 mg daily.   2.  Pulmonary - On room air. Encourage incentive spirometer. Mucinex for cough. Of note, he has aright upper lobe lung mass which will be followed up with  CT under Dr. Bari Mantis care 3. AKI-creatinine decreased from 1.88 to 1.25.  4.  Acute blood loss anemia - Last H and H 8.6 and 27.7 this am 5. Leukocytosis-Last WBC decreased from 21,000 to 14,300. On Cefipime  for LLL opacity (doubt PNA, possible bronchitis). Per Dr. Cyndia Bent, will discharge on oral Levaquin. 6. Discharge  Donielle M ZimmermanPA-C 07/07/2018,7:19 AM [phone]   Chart reviewed, patient examined, agree with above. Renal function back to baseline.  Will send home. Put on Levaquin for 7 days for possible bronchitis. Follow up in office with a CXR.

## 2018-07-07 NOTE — Discharge Instructions (Signed)
You had a previously scheduled appointment with Dr. Geraldo Pitter on 07/08/18. This has been cancelled and we will reschedule after your 1 month appointment with Nell Range PA-C   ACTIVITY AND EXERCISE  Daily activity and exercise are an important part of your recovery. People recover at different rates depending on their general health and type of valve procedure.  Most people recovering from TAVR feel better relatively quickly   No lifting, pushing, pulling more than 10 pounds (examples to avoid: groceries, vacuuming, gardening, golfing):             - For one week with a procedure through the groin.             - For six weeks for procedures through the chest wall or neck NOTE: You will typically see one of our providers 7-14 days after your procedure to discuss [place] the above activities.      DRIVING  Do not drive for until you are seen for follow up and cleared by a provider. Generally, we ask patient to not drive for 1 week after their procedure.  If you have been told by your doctor in the past that you may not drive, you must talk with him/her before you begin driving again.     DRESSING  Groin site: you may leave the clear dressing over the site for up to one week or until it falls off.     HYGIENE  If you had a femoral (leg) procedure, you may take a shower when you return home. After the shower, pat the site dry. Do NOT use powder, oils or lotions in your groin area until the site has completely healed.  If you had a chest procedure, you may shower when you return home unless specifically instructed not to by your discharging practitioner.             - DO NOT scrub incision; pat dry with a towel             - DO NOT apply any lotions, oils, powders to the incision             - No tub baths / swimming for at least 2 weeks.  If you notice any fevers, chills, increased pain, swelling, bleeding or pus, please contact your doctor.   ADDITIONAL INFORMATION  If  you are going to have an upcoming dental procedure, please contact our office as you will require antibiotics ahead of time to prevent infection on your heart valve.    If you have any questions or concerns you can call the structural heart phone during normal business hours 8am-4pm. If you have an urgent need after hours or weekends please call [phone] to talk to the on call provider for general cardiology. If you have an emergency that requires immediate attention, please call 911.    After TAVR Checklist  Check  Test Description   Follow up appointment in 1-2 weeks  Most of our patients will see our structural heart physician assistant, Nell Range. Your incision sites will be checked and you will be cleared to drive and resume all normal activities if you are doing well.     1 month echo and follow up  You will have an echo to check on your new heart valve and be seen back in the office by Nell Range. Many times the echo is not read by your appointment time, but Joellen Jersey will call you later that day or  the following day to report your results.   Follow up with your primary cardiologist You will need to be seen by your primary cardiologist in the following 3-6 months after your 1 month appointment in the valve clinic. Often times your Plavix or Aspirin will be discontinued during this time, but this is decided on a case by case basis.    1 year echo and follow up You will have another echo to check on your heart valve after 1 year and be seen back in the office by Nell Range. This your last structural heart visit.   Bacterial endocarditis prophylaxis  You will have to take antibiotics for the rest of your life before all dental procedures (even teeth cleanings) to protect your heart valve. Antibiotics are also required before some surgeries. Please check with your cardiologist before scheduling any surgeries. Also, please make sure to tell us if you have a penicillin allergy as you will  require an alternative antibiotic.

## 2018-07-08 ENCOUNTER — Ambulatory Visit: Payer: Medicare Other | Admitting: Cardiology

## 2018-07-10 NOTE — Progress Notes (Signed)
HE[facility] AND [place]                                       Cardiology Office Note    Date:  07/12/2018   ID:  Christopher Rocha, DOB 08/19/1941, MRN [id number]  PCP:  [name], MD  Cardiologist: Dr. Geraldo Pitter / Dr. Burt Knack & Dr. Cyndia Bent (TAVR)  CC: [facility] s/p TAVR  History of Present Illness:  Christopher Rocha is a 77 y.o. 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  Christopher Rocha has developed severe symptomatic aortic stenosis. Mean transaortic gradient is 60 mmHg in the setting of normal LV systolic function. Cardiac catheterization demonstrated patency of his bypass grafts. 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 He has been evaluated by Dr Christopher Rocha with pulmonary and Dr Cyndia Bent with cardiothoracic surgery. We have all discussed his case extensively and agree that it is indicated and appropriate to proceed with TAVR to relieve symptoms of critical aortic stenosis and allow for further workup and treatment of his lung mass.  He underwent successful TAVR with a 29 mm Edwards Sapien 3 THV via the TA approach on 07/02/18. Post operative echo showed EF55%, normally functioning TAVR with mean gradient of 4 mm Hg and trivial PVL. His hospital course was complicated by AKI and possible bronchitis. Lopressor was started and Hyzaar discontinued. He was discharged on Levaquin.  Today he presents to clinic for follow up. No CP or SOB. No LE edema, orthopnea or PND. Has some mild dizziness with positional changes but no syncope. No blood in stool or urine. No palpitations. Still has a cough, but overall improving. Overall he says he really never had any bad symptoms to start with and feels fine now. He is very hopeful the scan in December will be reassuring.    Past Medical History:  Diagnosis Date  .  Anemia    iron deficiency   . Coronary artery disease   . HLD (hyperlipidemia)   . HTN (hypertension)   . Pulmonary nodule   . S/P CABG (coronary artery bypass graft)   . S/P TAVR (transcatheter aortic valve replacement)    Edwards Sapien XT THV (size 29 mm, model # 9300TFX, serial # [id number])  . Severe aortic stenosis     Past Surgical History:  Procedure Laterality Date  . BONE MARROW ASPIRATION    . CARDIAC CATHETERIZATION    . CARDIAC SURGERY    . COLONOSCOPY    . CORONARY ARTERY BYPASS GRAFT    . EYE SURGERY     bilateral cataracts  . RIGHT/LEFT HEART CATH AND CORONARY/GRAFT ANGIOGRAPHY N/A 05/08/2018   Procedure: RIGHT/LEFT HEART CATH AND CORONARY/GRAFT ANGIOGRAPHY;  Surgeon: Wellington Hampshire, MD;  Location: [place] [facility];  Service: Cardiovascular;  Laterality: N/A;  . TEE WITHOUT CARDIOVERSION  07/02/2018   Procedure: TRANSESOPHAGEAL ECHOCARDIOGRAM (TEE);  Surgeon: Sherren Mocha, MD;  Location: [place];  Service: Open Heart Surgery;;  . TRANSCATHETER AORTIC VALVE REPLACEMENT, TRANSAPICAL N/A 07/02/2018   Procedure: TRANSCATHETER AORTIC VALVE REPLACEMENT, TRANSAPICAL;  Surgeon: Sherren Mocha, MD;  Location: [place];  Service: Open Heart Surgery;  Laterality: N/A;  . VIDEO BRONCHOSCOPY Bilateral 05/31/2018   Procedure: VIDEO BRONCHOSCOPY  WITH FLUORO;  Surgeon: Rigoberto Noel, MD;  Location: [facility];  Service: Cardiopulmonary;  Laterality: Bilateral;  . WISDOM TOOTH EXTRACTION      Current Medications: Outpatient Medications Prior to Visit  Medication Sig Dispense Refill  . acetaminophen (TYLENOL) 325 MG tablet Take 2 tablets (650 mg total) by mouth every 6 (six) hours as needed for mild pain.    Marland Kitchen aspirin EC 81 MG tablet Take 81 mg by mouth every other day.     Marland Kitchen atorvastatin (LIPITOR) 80 MG tablet Take 80 mg by mouth daily.     . clopidogrel (PLAVIX) 75 MG tablet Take 1 tablet (75 mg total) by mouth daily with breakfast. 30 tablet 1  . guaiFENesin (MUCINEX) 600 MG  12 hr tablet Take 1 tablet (600 mg total) by mouth 2 (two) times daily as needed for cough or to loosen phlegm.    Marland Kitchen ibuprofen (ADVIL,MOTRIN) 200 MG tablet Take 400 mg by mouth every 6 (six) hours as needed for headache or moderate pain.    . metoprolol tartrate (LOPRESSOR) 25 MG tablet Take 1 tablet (25 mg total) by mouth 2 (two) times daily. 60 tablet 1  . Naphazoline-Glycerin (REDNESS RELIEF OP) Place 1 drop into both eyes daily as needed (for redness/itching).    . traMADol (ULTRAM) 50 MG tablet Take 50 mg by mouth every 4-6 hours PRN severe pain 10 tablet 0  . levofloxacin (LEVAQUIN) 500 MG tablet Take 1 tablet (500 mg total) by mouth daily for 7 days. 7 tablet 0   No facility-administered medications prior to visit.      Allergies:   Penicillins   Social History   Socioeconomic History  . Marital status: Married    Spouse name: Not on file  . Number of children: Not on file  . Years of education: Not on file  . Highest education level: Not on file  Occupational History  . Not on file  Social Needs  . Financial resource strain: Not on file  . Food insecurity:    Worry: Not on file    Inability: Not on file  . Transportation needs:    Medical: Not on file    Non-medical: Not on file  Tobacco Use  . Smoking status: Former Research scientist (life sciences)  . Smokeless tobacco: Never Used  Substance and Sexual Activity  . Alcohol use: Not Currently    Frequency: Never  . Drug use: Never  . Sexual activity: Not on file  Lifestyle  . Physical activity:    Days per week: Not on file    Minutes per session: Not on file  . Stress: Not on file  Relationships  . Social connections:    Talks on phone: Not on file    Gets together: Not on file    Attends religious service: Not on file    Active member of club or organization: Not on file    Attends meetings of clubs or organizations: Not on file    Relationship status: Not on file  Other Topics Concern  . Not on file  Social History Narrative  .  Not on file     Family History:  The patient's family history includes Clotting disorder in his father; Diabetes in his mother.      ROS:   Please see the history of present illness.    ROS All other systems reviewed and are negative.   PHYSICAL EXAM:   VS:  BP (!) 160/70 (BP Location: Left Arm, Patient Position:  Sitting, Cuff Size: Normal)   Pulse 64   Ht 5\' 11"  (1.803 m)   Wt 196 lb 6.4 oz (89.1 kg)   BMI 27.39 kg/m    GEN: Well nourished, well developed, in no acute distress HEENT: normal Neck: no JVD or masses Cardiac: RRR; 2/6 SEM @RUSB . No rubs, or gallops,no edema  Respiratory:  Scattered rhonchi GI: soft, nontender, nondistended, + BS MS: no deformity or atrophy Skin: warm and dry, no rash. transapical site healing well. Chest tube sutures removed  Neuro:  Alert and Oriented x 3, Strength and sensation are intact Psych: euthymic mood, full affect    Wt Readings from Last 3 Encounters:  07/11/18 196 lb 6.4 oz (89.1 kg)  07/07/18 193 lb 3.2 oz (87.6 kg)  06/28/18 202 lb 6.4 oz (91.8 kg)      Studies/Labs Reviewed:   EKG:  EKG is ordered today.  The ekg ordered today demonstrates sinus with first deg AV block with PACs   Recent Labs: 06/28/2018: ALT 21; B Natriuretic Peptide 175.5 07/04/2018: Magnesium 2.2 07/06/2018: Hemoglobin 8.6; Platelets 154 07/11/2018: BUN 17; Creatinine, Ser 1.03; Potassium 4.2; Sodium 143   Lipid Panel No results found for: CHOL, TRIG, HDL, CHOLHDL, VLDL, LDLCALC, LDLDIRECT  Additional studies/ records that were reviewed today include:  CARDIOTHORACIC SURGERY OPERATIVE NOTE  Date of Procedure:07/02/2018  Procedure:   Transcatheter Aortic Valve Replacement - Transapical Approach Edwards Sapien XT THV (size 46mm, model # 9300TFX, serial # [id number])  Co-Surgeons:Bryan Alveria Apley, MD and Sherren Mocha, MD  Pre-operative Echo Findings: ? severe aortic  stenosis ? Normalleft ventricular systolic function   Post-operative Echo Findings: ? Mildparavalvular leak ? Normalleft ventricular systolic function  _____________   Echo 07/03/18:  Study Conclusions - Left ventricle: The cavity size was normal. Wall thickness was increased in a pattern of moderate LVH. Systolic function was normal. The estimated ejection fraction was in the range of 55% to 60%. - Aortic valve: Post TAVR with 29 mm Sapien 3 Trivial appearing peri valvular regurgitation. Appears to be some flow acceleration in diastole throubh mitral valve that is a bit hard to differentiate from potential leak Gradients remain low with mean 4 mmHG peak 7 mmHg. - Mitral valve: Severely calcified annulus. Moderately thickened, mildly calcified leaflets . There was mild regurgitation. - Left atrium: The atrium was moderately dilated.    ASSESSMENT & PLAN:   Severe AS s/p TAVR:doing well. Groin site and chest wall site healing well. Chest tube sutures removed. ECG with no HAVB. He will continue on ASA and plavix. SBE prophylaxis discussed; he has full dentures.  I will see him back at 1 month for echo and follow up.  HTN: BP elevated today. Continue on Lopressor 25mg  BID. Will check renal function and if back to baseline will resume home Hyzaar   AKI: creat up to 2.32 during recent admission. Hyzaar held. Check BMET today  Chronic diastolic CHF: appears euvolemic off diuretic therapy.   Lung mass: plan is for repeat CT can in December under the care of Dr. Elsworth Rocha.   CAD: pre TAVR cath showed patent bypass grafts. Continue medical therapy.  Cough: improving on Levaquin  Medication Adjustments/Labs and Tests Ordered: Current medicines are reviewed at length with the patient today.  Concerns regarding medicines are outlined above.  Medication changes, Labs and Tests ordered today are listed in the Patient Instructions below. Patient Instructions   Medication Instructions:  Your provider recommends that you continue on your current medications as directed. Please  refer to the Current Medication list given to you today.    Labwork: TODAY: BMET  Testing/Procedures: None  Follow-Up: Please keep your appointments for your echocardiogram and office visit on 08/08/2018. Please arrive by 1:30PM.   Other information: You are cleared to drive from a cardiac prospective!    Signed, Angelena Form, PA-C  07/12/2018 9:38 AM    [facility] [address], [place], [place]  [address] Phone: [phone]; Fax: [phone]

## 2018-07-11 ENCOUNTER — Ambulatory Visit: Payer: Medicare Other | Admitting: Physician Assistant

## 2018-07-11 ENCOUNTER — Encounter: Payer: Self-pay | Admitting: Physician Assistant

## 2018-07-11 VITALS — BP 160/70 | HR 64 | Ht 71.0 in | Wt 196.4 lb

## 2018-07-11 DIAGNOSIS — R918 Other nonspecific abnormal finding of lung field: Secondary | ICD-10-CM | POA: Diagnosis not present

## 2018-07-11 DIAGNOSIS — I10 Essential (primary) hypertension: Secondary | ICD-10-CM

## 2018-07-11 DIAGNOSIS — Z952 Presence of prosthetic heart valve: Secondary | ICD-10-CM

## 2018-07-11 DIAGNOSIS — I2510 Atherosclerotic heart disease of native coronary artery without angina pectoris: Secondary | ICD-10-CM

## 2018-07-11 DIAGNOSIS — I5032 Chronic diastolic (congestive) heart failure: Secondary | ICD-10-CM

## 2018-07-11 DIAGNOSIS — N179 Acute kidney failure, unspecified: Secondary | ICD-10-CM

## 2018-07-11 NOTE — Patient Instructions (Addendum)
Medication Instructions:  Your provider recommends that you continue on your current medications as directed. Please refer to the Current Medication list given to you today.    Labwork: TODAY: BMET  Testing/Procedures: None  Follow-Up: Please keep your appointments for your echocardiogram and office visit on 08/08/2018. Please arrive by 1:30PM.   Other information: You are cleared to drive from a cardiac prospective!

## 2018-07-12 ENCOUNTER — Encounter: Payer: Self-pay | Admitting: Thoracic Surgery (Cardiothoracic Vascular Surgery)

## 2018-07-12 ENCOUNTER — Telehealth: Payer: Self-pay

## 2018-07-12 LAB — BASIC METABOLIC PANEL
BUN/Creatinine Ratio: 17 (ref 10–24)
BUN: 17 mg/dL (ref 8–27)
CALCIUM: 9.2 mg/dL (ref 8.6–10.2)
CO2: 25 mmol/L (ref 20–29)
CREATININE: 1.03 mg/dL (ref 0.76–1.27)
Chloride: 102 mmol/L (ref 96–106)
GFR calc Af Amer: 81 mL/min/{1.73_m2} (ref >59)
GFR, EST NON AFRICAN AMERICAN: 70 mL/min/{1.73_m2} (ref >59)
Glucose: 134 mg/dL — ABNORMAL HIGH (ref 65–99)
Potassium: 4.2 mmol/L (ref 3.5–5.2)
Sodium: 143 mmol/L (ref 134–144)

## 2018-07-12 NOTE — Telephone Encounter (Signed)
-----   Message from Eileen Stanford, PA-C sent at 07/12/2018  9:22 AM EST ----- Creat back to baseline. Will resume home Hyzaar (losartan-hydrochlorothiazide 100-12.5)

## 2018-07-12 NOTE — Telephone Encounter (Signed)
Informed patient of results and verbal understanding expressed.  He understands to resume Hyzaar 100-12.5mg  daily. He was grateful for call.   Med list updated.

## 2018-07-15 DIAGNOSIS — Z09 Encounter for follow-up examination after completed treatment for conditions other than malignant neoplasm: Secondary | ICD-10-CM | POA: Diagnosis not present

## 2018-07-15 DIAGNOSIS — J181 Lobar pneumonia, unspecified organism: Secondary | ICD-10-CM | POA: Diagnosis not present

## 2018-07-19 DIAGNOSIS — D510 Vitamin B12 deficiency anemia due to intrinsic factor deficiency: Secondary | ICD-10-CM | POA: Diagnosis not present

## 2018-07-23 LAB — ACID FAST CULTURE WITH REFLEXED SENSITIVITIES: ACID FAST CULTURE - AFSCU3: NEGATIVE

## 2018-07-23 LAB — ACID FAST CULTURE WITH REFLEXED SENSITIVITIES (MYCOBACTERIA)

## 2018-07-29 ENCOUNTER — Telehealth: Payer: Self-pay

## 2018-07-29 DIAGNOSIS — R918 Other nonspecific abnormal finding of lung field: Secondary | ICD-10-CM | POA: Diagnosis not present

## 2018-07-29 DIAGNOSIS — Z954 Presence of other heart-valve replacement: Secondary | ICD-10-CM | POA: Diagnosis not present

## 2018-07-29 DIAGNOSIS — D509 Iron deficiency anemia, unspecified: Secondary | ICD-10-CM | POA: Diagnosis not present

## 2018-07-29 NOTE — Telephone Encounter (Signed)
Noted, will await call to schedule patient if needed.

## 2018-07-29 NOTE — Telephone Encounter (Signed)
SG-Dr [name] wanted to speak to someone about this patient. Please call [phone]

## 2018-07-29 NOTE — Telephone Encounter (Signed)
I have called the physician back. He was wanting to make sure this patient had a follow up CT scheduled. The order was placed By Elsworth Soho, but the MD is unsure if the patient is scheduled at [facility] for the scan. He is going to call [name] and see if the patient is scheduled  for the scan. If the patient is not scheduled for the scan , he will call Triage and I need you all to schedule the scan. This patient has a large mass and we need to make sure the scan is done in December as ordered. . I looked  But cannot see a date it is scheduled. Thanks so much.

## 2018-08-06 DIAGNOSIS — R911 Solitary pulmonary nodule: Secondary | ICD-10-CM | POA: Diagnosis not present

## 2018-08-06 DIAGNOSIS — R918 Other nonspecific abnormal finding of lung field: Secondary | ICD-10-CM | POA: Diagnosis not present

## 2018-08-06 NOTE — Telephone Encounter (Signed)
Checked order review and saw where order for CT was placed 06/14/18 and we stated in there that pt wanted to have CT scheduled at [facility].  [place] and spoke with Drue Dun stating to her that the order was placed then. Drue Dun stated to me that they could not locate the order. I stated to her that I would fax the order to her. Kristie expressed understanding.  I verified the fax number and have faxed the order. Nothing further needed.

## 2018-08-06 NOTE — Telephone Encounter (Signed)
Kristie with [facility] Dept [phone]) states PATIENT IS THERE NOW for CT scan of chest and they need an order for this. Needs faxed ASAP to [phone].

## 2018-08-08 ENCOUNTER — Ambulatory Visit: Payer: Medicare Other | Admitting: Physician Assistant

## 2018-08-08 ENCOUNTER — Other Ambulatory Visit (HOSPITAL_COMMUNITY): Payer: Medicare Other

## 2018-08-09 ENCOUNTER — Other Ambulatory Visit: Payer: Self-pay

## 2018-08-09 NOTE — Patient Outreach (Signed)
[facility]) Care Management  08/09/2018  Christopher Rocha 13-Apr-1941 [id number]   Medication Adherence call to Christopher Rocha spoke with patient he is due on Atorvastatin 80 mg patient ask if we can call [facility] an order this medication and he will pick up when ready .[facility] will fill and have it ready for the patient. Christopher Rocha is showing past due under [place].   [facility] Direct Dial [phone]  Fax [phone] [email or web address]

## 2018-08-14 ENCOUNTER — Ambulatory Visit: Payer: Medicare Other | Admitting: Pulmonary Disease

## 2018-08-14 ENCOUNTER — Encounter: Payer: Self-pay | Admitting: Pulmonary Disease

## 2018-08-14 DIAGNOSIS — R918 Other nonspecific abnormal finding of lung field: Secondary | ICD-10-CM

## 2018-08-14 DIAGNOSIS — J432 Centrilobular emphysema: Secondary | ICD-10-CM

## 2018-08-14 MED ORDER — PREDNISONE 10 MG PO TABS: ORAL_TABLET | ORAL | 0 refills | Status: DC

## 2018-08-14 MED ORDER — BUDESONIDE-FORMOTEROL FUMARATE 160-4.5 MCG/ACT IN AERO: 2.0000 | INHALATION_SPRAY | Freq: Two times a day (BID) | RESPIRATORY_TRACT | 0 refills | Status: DC

## 2018-08-14 NOTE — Patient Instructions (Signed)
Very slight decrease in size of right upper lobe lung mass  Prednisone 10 mg tabs  Take 2 tabs daily with food x 7ds, then 1 tab daily with food x 7ds then STOP  Trial of Symbicort 160 -2 puffs twice daily, rinse mouth after use Call for prescription if this helps

## 2018-08-14 NOTE — Assessment & Plan Note (Signed)
Very slight decrease in size of right upper lobe lung mass is encouraging and makes malignancy unlikely.  Also note atypical nature with air bronchograms in it suggesting infectious/inflammatory cause.  We have already treated him with 2 rounds of antibiotics so far  We will try a short course of prednisone   Prednisone 10 mg tabs  Take 2 tabs daily with food x 7ds, then 1 tab daily with food x 7ds then STOP  Also discussed CT-guided biopsy with patient and risks and benefits, he would like to hold off  Repeat CT chest without contrast in 2 months

## 2018-08-14 NOTE — Addendum Note (Signed)
Addended by: Valerie Salts on: 08/14/2018 04:21 PM   Modules accepted: Orders

## 2018-08-14 NOTE — Progress Notes (Addendum)
[facility] [place]                                       Cardiology Office Note    Date:  08/16/2018   ID:  Christopher Rocha, DOB [birth year], MRN [id number]  PCP:  Christopher Hipps, MD  Cardiologist: Dr. Geraldo Rocha / Dr. Burt Rocha & Dr. Cyndia Rocha (TAVR)  CC: 1 month s/p TAVR  History of Present Illness:  Christopher Rocha is a 77 y.o. 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  Mr. Frazer has developed severe symptomatic aortic stenosis. Mean transaortic gradient was 60 mmHg in the setting of normal LV systolic function. Cardiac catheterization demonstrated patency of his bypass grafts. 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 He has been evaluated by Dr Christopher Rocha with pulmonary and Dr Christopher Rocha with cardiothoracic surgery. We have all discussed his case extensively and agree that it is indicated and appropriate to proceed with TAVR to relieve symptoms of critical aortic stenosis and allow for further workup and treatment of his lung mass.  He underwent successful TAVR with a 29 mm Edwards Sapien 3 THV via the TA approach on 07/02/18. Post operative echo showed EF 55%, normally functioning TAVR with mean gradient of 4 mm Hg and trivial PVL. His hospital course was complicated by AKI and possible bronchitis. Lopressor was started and Hyzaar discontinued. He was discharged on Levaquin.  Follow up lab work showed creat back to baseline and Hyzaar was resumed.  He was seen by PCP recently treated with another round of Abx for PNA and now on prednisone. He had a repeat chest CT at [facility] that showed stable lung mass.   Today he presents to clinic for follow up. No CP or SOB. No LE edema, orthopnea or PND. No dizziness or syncope. No blood in stool or urine. No palpitations. He has been back walking his 52  steps at his home and no longer huffs and puffs. He still has some mild DOE and fatigue.    Past Medical History:  Diagnosis Date  . Anemia    iron deficiency   . Coronary artery disease   . HLD (hyperlipidemia)   . HTN (hypertension)   . Pulmonary nodule   . S/P CABG (coronary artery bypass graft)   . S/P TAVR (transcatheter aortic valve replacement)    Edwards Sapien XT THV (size 29 mm, model # 9300TFX, serial # [id number])  . Severe aortic stenosis     Past Surgical History:  Procedure Laterality Date  . BONE MARROW ASPIRATION    . CARDIAC CATHETERIZATION    . CARDIAC SURGERY    . COLONOSCOPY    . CORONARY ARTERY BYPASS GRAFT    . EYE SURGERY     bilateral cataracts  . RIGHT/LEFT HEART CATH AND CORONARY/GRAFT ANGIOGRAPHY N/A 05/08/2018   Procedure: RIGHT/LEFT HEART CATH AND CORONARY/GRAFT ANGIOGRAPHY;  Surgeon: Christopher Hampshire, MD;  Location: [facility];  Service: Cardiovascular;  Laterality: N/A;  . TEE WITHOUT CARDIOVERSION  07/02/2018   Procedure: TRANSESOPHAGEAL ECHOCARDIOGRAM (TEE);  Surgeon: Christopher Mocha, MD;  Location: [facility];  Service: Open Heart Surgery;;  . TRANSCATHETER AORTIC VALVE REPLACEMENT, TRANSAPICAL N/A 07/02/2018   Procedure:  TRANSCATHETER AORTIC VALVE REPLACEMENT, TRANSAPICAL;  Surgeon: Christopher Mocha, MD;  Location: [facility];  Service: Open Heart Surgery;  Laterality: N/A;  . VIDEO BRONCHOSCOPY Bilateral 05/31/2018   Procedure: VIDEO BRONCHOSCOPY WITH FLUORO;  Surgeon: Christopher Noel, MD;  Location: [facility];  Service: Cardiopulmonary;  Laterality: Bilateral;  . WISDOM TOOTH EXTRACTION      Current Medications: Outpatient Medications Prior to Visit  Medication Sig Dispense Refill  . acetaminophen (TYLENOL) 325 MG tablet Take 2 tablets (650 mg total) by mouth every 6 (six) hours as needed for mild pain.    Marland Kitchen aspirin EC 81 MG tablet Take 81 mg by mouth every other day.     Marland Kitchen atorvastatin (LIPITOR) 80 MG tablet Take 80 mg by mouth daily.     .  budesonide-formoterol (SYMBICORT) 160-4.5 MCG/ACT inhaler Inhale 2 puffs into the lungs 2 (two) times daily. 1 Inhaler 0  . guaiFENesin (MUCINEX) 600 MG 12 hr tablet Take 1 tablet (600 mg total) by mouth 2 (two) times daily as needed for cough or to loosen phlegm.    Marland Kitchen ibuprofen (ADVIL,MOTRIN) 200 MG tablet Take 400 mg by mouth every 6 (six) hours as needed for headache or moderate pain.    Marland Kitchen losartan-hydrochlorothiazide (HYZAAR) 100-12.5 MG tablet Take 1 tablet by mouth daily.    . metoprolol tartrate (LOPRESSOR) 25 MG tablet Take 1 tablet (25 mg total) by mouth 2 (two) times daily. 60 tablet 1  . Naphazoline-Glycerin (REDNESS RELIEF OP) Place 1 drop into both eyes daily as needed (for redness/itching).    . predniSONE (DELTASONE) 10 MG tablet Take 2 tabs daily for 7 days, then 1 tab daily for 7 days. 21 tablet 0  . traMADol (ULTRAM) 50 MG tablet Take 50 mg by mouth every 4-6 hours PRN severe pain 10 tablet 0  . clopidogrel (PLAVIX) 75 MG tablet Take 1 tablet (75 mg total) by mouth daily with breakfast. 30 tablet 1   No facility-administered medications prior to visit.      Allergies:   Penicillins   Social History   Socioeconomic History  . Marital status: Married    Spouse name: Not on file  . Number of children: Not on file  . Years of education: Not on file  . Highest education level: Not on file  Occupational History  . Not on file  Social Needs  . Financial resource strain: Not on file  . Food insecurity:    Worry: Not on file    Inability: Not on file  . Transportation needs:    Medical: Not on file    Non-medical: Not on file  Tobacco Use  . Smoking status: Former Research scientist (life sciences)  . Smokeless tobacco: Never Used  Substance and Sexual Activity  . Alcohol use: Not Currently    Frequency: Never  . Drug use: Never  . Sexual activity: Not on file  Lifestyle  . Physical activity:    Days per week: Not on file    Minutes per session: Not on file  . Stress: Not on file    Relationships  . Social connections:    Talks on phone: Not on file    Gets together: Not on file    Attends religious service: Not on file    Active member of club or organization: Not on file    Attends meetings of clubs or organizations: Not on file    Relationship status: Not on file  Other Topics Concern  . Not on file  Social  History Narrative  . Not on file     Family History:  The patient's family history includes Clotting disorder in his father; Diabetes in his mother.      ROS:   Please see the history of present illness.    ROS All other systems reviewed and are negative.   PHYSICAL EXAM:   VS:  BP (!) 164/70   Pulse 69   Ht 5\' 11"  (1.803 m)   Wt 190 lb (86.2 kg)   SpO2 97%   BMI 26.50 kg/m    GEN: chronically ill appeaing HEENT: normal Neck: no JVD or masses Cardiac: RRR; 2/6 flow murmur. No rubs, or gallops,no edema  Respiratory:  clear to auscultation bilaterally, normal work of breathing GI: soft, nontender, nondistended, + BS MS: no deformity or atrophy Skin: warm and dry, no rash Neuro:  Alert and Oriented x 3, Strength and sensation are intact Psych: euthymic mood, full affect   Wt Readings from Last 3 Encounters:  08/15/18 190 lb (86.2 kg)  08/14/18 191 lb 9.6 oz (86.9 kg)  07/11/18 196 lb 6.4 oz (89.1 kg)      Studies/Labs Reviewed:   EKG:  EKG is NOT ordered today.    Recent Labs: 06/28/2018: ALT 21; B Natriuretic Peptide 175.5 07/04/2018: Magnesium 2.2 07/06/2018: Hemoglobin 8.6; Platelets 154 07/11/2018: BUN 17; Creatinine, Ser 1.03; Potassium 4.2; Sodium 143   Lipid Panel No results found for: CHOL, TRIG, HDL, CHOLHDL, VLDL, LDLCALC, LDLDIRECT  Additional studies/ records that were reviewed today include:  CARDIOTHORACIC SURGERY OPERATIVE NOTE  Date of Procedure:07/02/2018  Procedure:   Transcatheter Aortic Valve Replacement - Transapical Approach Edwards Sapien XT THV (size 83mm, model  # 9300TFX, serial # [id number])  Co-Surgeons:Bryan Alveria Apley, MD and Christopher Mocha, MD  Pre-operative Echo Findings: ? severe aortic stenosis ? Normalleft ventricular systolic function   Post-operative Echo Findings: ? Mildparavalvular leak ? Normalleft ventricular systolic function  _____________   Echo 07/03/18:  Study Conclusions - Left ventricle: The cavity size was normal. Wall thickness was increased in a pattern of moderate LVH. Systolic function was normal. The estimated ejection fraction was in the range of 55% to 60%. - Aortic valve: Post TAVR with 29 mm Sapien 3 Trivial appearing peri valvular regurgitation. Appears to be some flow acceleration in diastole throubh mitral valve that is a bit hard to differentiate from potential leak Gradients remain low with mean 4 mmHG peak 7 mmHg. - Mitral valve: Severely calcified annulus. Moderately thickened, mildly calcified leaflets . There was mild regurgitation. - Left atrium: The atrium was moderately dilated.   _____________   Echo 08/15/18 Study Conclusions - Procedure narrative: Transthoracic echocardiography. Image   quality was suboptimal. The study was technically difficult.   Intravenous contrast (Definity) was administered to opacify the   LV. - Left ventricle: The cavity size was normal. Wall thickness was   increased in a pattern of moderate LVH. Systolic function was   normal. The estimated ejection fraction was in the range of 60%   to 65%. Wall motion was normal; there were no regional wall   motion abnormalities. - Aortic valve: A bioprosthesis was present. Mean gradient (S): 13   mm Hg. Peak gradient (S): 27 mm Hg. - Mitral valve: There was mild regurgitation. - Left atrium: The atrium was mildly dilated.    ASSESSMENT & PLAN:   Severe AS s/p TAVR: echo today shows EF 60%, normally functioning TAVR with mean gradient 13 mm Hg and no PVL. He has  NYHA  class II symptoms. SBE prophylaxis discussed; he has full dentures and does not visit the dentist. Plavix can be discontinued after 6 months of therapy (12/2018).   HTN: BP elevated and worse on my personal recheck. He has not taken any of his antihypertensives today and said it was running completely normal at his PCPs office yesterday while on his normal regimen. I have asked him to take all his medications when he gets home today  CKD: creat was in normal range at 1.03 last month  Chronic diastolic CHF: appears euvolemic off diuretic therapy.   Lung mass: repeat CT scan was done last week by Dr. Elsworth Rocha, but it was done at [facility] so I cannot see it in our system. Per patient report, his lung mass has stayed stable if not gotten a little smaller.    CAD: pre TAVR cath showed patent bypass grafts. Continue medical therapy.  Cough: this has improved after several rounds of Abx, recently put on prednisone taper by PCP  Addendum:  Liver lesions: incidental finding on pre TAVR CT. "Indeterminate liver lesions, as above. These could be definitively characterized with nonemergent MRI of the abdomen with and without IV gadolinium if clinically appropriate." PET scan done for lung mass showed normal liver: " No abnormal activity in the liver. No hypermetabolic upper abdominal lymph nodes. No pelvic Lymphadenopathy." Discussed with patient's wife over phone and will fax to PCP, Dr. Helene Kelp in [place], [place].   Medication Adjustments/Labs and Tests Ordered: Current medicines are reviewed at length with the patient today.  Concerns regarding medicines are outlined above.  Medication changes, Labs and Tests ordered today are listed in the Patient Instructions below. Patient Instructions  Medication Instructions:  You may STOP PLAVIX 01/01/2019 (unless instructed otherwise between now and then)  Labwork: None  Testing/Procedures: Your provider has requested that you have an echocardiogram.  Echocardiography is a painless test that uses sound waves to create images of your heart. It provides your doctor with information about the size and shape of your heart and how well your heart's chambers and valves are working. This procedure takes approximately one hour. There are no restrictions for this procedure. You are scheduled for your echocardiogram on June 19, 2019 at 1:00PM. Please arrive by 12:30PM for this appointment.  Follow-Up: You have an appointment scheduled with Dr. Geraldo Rocha November 01, 2018 at [facility] will have your one year TAVR appointment with Nell Range immediately following your echo on June 19, 2019.      Signed, Angelena Form, PA-C  08/16/2018 1:33 PM    [facility] [address], [place], [place]  [address] Phone: [phone]; Fax: [phone]

## 2018-08-14 NOTE — Progress Notes (Signed)
   Subjective:    Patient ID: Christopher Rocha, male    DOB: [birth year], 77 y.o.   MRN: [id number]  HPI  77 year old remote smoker  for FU of  of right upper lobe lung infiltrate He underwent emergent CABG in 2014.He underwent TAVR for  severe aortic stenosis.He was  found to have refractory anemia and underwent bone marrow biopsy on 9/10 and myelodysplastic syndrome is being considered. He smoked 2 packs/day until age 26 when he quit, more than 60 pack years. 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  Underwent bscopy >. BAL - pseudomonas, TBBX neg He was treated with 7 days of Levaquin.  He was asymptomatic with his infiltrate.  Chief Complaint  Patient presents with  . Follow-up    Discuss CT results. States his breathing has been ok since his last visit. Was treated for PNA last month.    He underwent TAVR which was uneventful except for some postop delirium.  His coughing was worse and caused severe chest pain.  After discharge he was treated for bronchitis with another antibiotic by his PCP.  We reviewed repeat CT chest today from 08/06/2018 and Christopher Rocha, right upper lobe mass has decreased slightly from 5.3 x 4.7 x 4.1 cm to 4.9 x 4.5 x 3.4 cm, left lower lobe 2 cm density pleural-based seems to persist  Of note chest x-ray from 11/2012 was reviewed, which are not available but report suggests right upper lobe infiltrate  Significant tests/ events reviewed  CT chest with contrast 05/20/2018 clarified this to be a5.3x 4.1cm masslike lesion with air bronchograms in the right upper lobe. There was a bandlike opacity extending towards the pleural from this mass was also patchy and bandlike opacities in the left lower lobe and medial right lower lobe. There is a 1 cm band was density in the medial right lung apex and a calcified granuloma in the liver lateral renal cysts.  PET  73/42/87 >> hypermetabolic RUL & LLL  PFTs 02/8114 ratio 54, FEV1 of 60%, FVC of  72% consistent with moderate airway obstruction, TLC 97%, DLCO 53%   Past Medical History:  Diagnosis Date  . Anemia    iron deficiency   . Coronary artery disease   . HLD (hyperlipidemia)   . HTN (hypertension)   . Pulmonary nodule   . S/P CABG (coronary artery bypass graft)   . S/P TAVR (transcatheter aortic valve replacement)    Edwards Sapien XT THV (size 29 mm, model # 9300TFX, serial # [id number])  . Severe aortic stenosis      Review of Systems  neg for any significant sore throat, dysphagia, itching, sneezing, nasal congestion or excess/ purulent secretions, fever, chills, sweats, unintended wt loss, pleuritic or exertional cp, hempoptysis, orthopnea pnd or change in chronic leg swelling. Also denies presyncope, palpitations, heartburn, abdominal pain, nausea, vomiting, diarrhea or change in bowel or urinary habits, dysuria,hematuria, rash, arthralgias, visual complaints, headache, numbness weakness or ataxia.     Objective:   Physical Exam  Gen. Pleasant, well-nourished, in no distress ENT - no thrush, no post nasal drip Neck: No JVD, no thyromegaly, no carotid bruits Lungs: no use of accessory muscles, no dullness to percussion, clear without rales, faint scattered  rhonchi  Cardiovascular: Rhythm regular, heart sounds  normal, no murmurs or gallops, no peripheral edema Musculoskeletal: No deformities, no cyanosis or clubbing        Assessment & Plan:

## 2018-08-14 NOTE — Assessment & Plan Note (Signed)
Trial of Symbicort 160 -2 puffs twice daily, rinse mouth after use Call for prescription if this helps

## 2018-08-15 ENCOUNTER — Ambulatory Visit (HOSPITAL_COMMUNITY): Payer: Medicare Other | Attending: Cardiovascular Disease

## 2018-08-15 ENCOUNTER — Other Ambulatory Visit: Payer: Self-pay | Admitting: Physician Assistant

## 2018-08-15 ENCOUNTER — Ambulatory Visit: Payer: Medicare Other | Admitting: Physician Assistant

## 2018-08-15 ENCOUNTER — Encounter: Payer: Self-pay | Admitting: Physician Assistant

## 2018-08-15 ENCOUNTER — Other Ambulatory Visit: Payer: Self-pay

## 2018-08-15 VITALS — BP 164/70 | HR 69 | Ht 71.0 in | Wt 190.0 lb

## 2018-08-15 DIAGNOSIS — Z952 Presence of prosthetic heart valve: Secondary | ICD-10-CM | POA: Diagnosis not present

## 2018-08-15 DIAGNOSIS — I5032 Chronic diastolic (congestive) heart failure: Secondary | ICD-10-CM

## 2018-08-15 DIAGNOSIS — I10 Essential (primary) hypertension: Secondary | ICD-10-CM | POA: Diagnosis not present

## 2018-08-15 DIAGNOSIS — R918 Other nonspecific abnormal finding of lung field: Secondary | ICD-10-CM | POA: Diagnosis not present

## 2018-08-15 DIAGNOSIS — I340 Nonrheumatic mitral (valve) insufficiency: Secondary | ICD-10-CM | POA: Diagnosis not present

## 2018-08-15 DIAGNOSIS — N189 Chronic kidney disease, unspecified: Secondary | ICD-10-CM | POA: Diagnosis not present

## 2018-08-15 DIAGNOSIS — I2510 Atherosclerotic heart disease of native coronary artery without angina pectoris: Secondary | ICD-10-CM

## 2018-08-15 MED ORDER — CLOPIDOGREL BISULFATE 75 MG PO TABS: 75.0000 mg | ORAL_TABLET | Freq: Every day | ORAL | 4 refills | Status: DC

## 2018-08-15 MED ORDER — PERFLUTREN LIPID MICROSPHERE
1.0000 mL | INTRAVENOUS | Status: AC | PRN
  Administered 2018-08-15: 2 mL via INTRAVENOUS

## 2018-08-15 NOTE — Patient Instructions (Addendum)
Medication Instructions:  You may STOP PLAVIX 01/01/2019 (unless instructed otherwise between now and then)  Labwork: None  Testing/Procedures: Your provider has requested that you have an echocardiogram. Echocardiography is a painless test that uses sound waves to create images of your heart. It provides your doctor with information about the size and shape of your heart and how well your heart's chambers and valves are working. This procedure takes approximately one hour. There are no restrictions for this procedure. You are scheduled for your echocardiogram on June 19, 2019 at 1:00PM. Please arrive by 12:30PM for this appointment.  Follow-Up: You have an appointment scheduled with Dr. Geraldo Pitter November 01, 2018 at [facility] will have your one year TAVR appointment with Nell Range immediately following your echo on June 19, 2019.

## 2018-08-19 DIAGNOSIS — D510 Vitamin B12 deficiency anemia due to intrinsic factor deficiency: Secondary | ICD-10-CM | POA: Diagnosis not present

## 2018-08-29 DIAGNOSIS — E7800 Pure hypercholesterolemia, unspecified: Secondary | ICD-10-CM | POA: Diagnosis not present

## 2018-08-29 DIAGNOSIS — J189 Pneumonia, unspecified organism: Secondary | ICD-10-CM | POA: Diagnosis not present

## 2018-09-18 DIAGNOSIS — D510 Vitamin B12 deficiency anemia due to intrinsic factor deficiency: Secondary | ICD-10-CM | POA: Diagnosis not present

## 2018-09-19 ENCOUNTER — Encounter: Payer: Self-pay | Admitting: Thoracic Surgery (Cardiothoracic Vascular Surgery)

## 2018-10-08 ENCOUNTER — Telehealth: Payer: Self-pay | Admitting: Pulmonary Disease

## 2018-10-08 NOTE — Telephone Encounter (Signed)
Working on this

## 2018-10-08 NOTE — Telephone Encounter (Signed)
Will send back to pcc as this was ordered in October.

## 2018-10-08 NOTE — Telephone Encounter (Signed)
[facility] can you help with this, thank you.

## 2018-10-08 NOTE — Telephone Encounter (Signed)
RA please advise, patient had a CT scan done on 08/06/2018 at [facility]. I have attached your AVS instructions below.   Instructions      Return in about 2 months (around 10/15/2018) for after CT.  Very slight decrease in size of right upper lobe lung mass  Prednisone 10 mg tabs  Take 2 tabs daily with food x 7ds, then 1 tab daily with food x 7ds then STOP  Trial of Symbicort 160 -2 puffs twice daily, rinse mouth after use Call for prescription if this helps      ______________________________________________________________________________  Does patient need another CT scan before next visit. Please advise, thank you.

## 2018-10-08 NOTE — Telephone Encounter (Signed)
This will require a new CT order/referral to be placed & then we can schedule this.

## 2018-10-08 NOTE — Telephone Encounter (Signed)
The ct that is in here from 06/2018 from what I can tell from a phone note the patient had this on 08/06/18 at [facility]

## 2018-10-08 NOTE — Telephone Encounter (Signed)
Please schedule repeat CT chest without contrast before his appointment on 2/24

## 2018-10-09 NOTE — Telephone Encounter (Signed)
CT is Sched for 2/7 at 2:30 pm @ [facility] pt & let him know.  Pt verbalized understanding & stated nothing further needed at this time.

## 2018-10-11 DIAGNOSIS — J439 Emphysema, unspecified: Secondary | ICD-10-CM | POA: Diagnosis not present

## 2018-10-11 DIAGNOSIS — R918 Other nonspecific abnormal finding of lung field: Secondary | ICD-10-CM | POA: Diagnosis not present

## 2018-10-11 DIAGNOSIS — R911 Solitary pulmonary nodule: Secondary | ICD-10-CM | POA: Diagnosis not present

## 2018-10-11 DIAGNOSIS — K449 Diaphragmatic hernia without obstruction or gangrene: Secondary | ICD-10-CM | POA: Diagnosis not present

## 2018-10-11 DIAGNOSIS — I700 Atherosclerosis of aorta: Secondary | ICD-10-CM | POA: Diagnosis not present

## 2018-10-22 DIAGNOSIS — D510 Vitamin B12 deficiency anemia due to intrinsic factor deficiency: Secondary | ICD-10-CM | POA: Diagnosis not present

## 2018-10-28 ENCOUNTER — Ambulatory Visit: Payer: Medicare Other | Admitting: Pulmonary Disease

## 2018-10-28 ENCOUNTER — Encounter: Payer: Self-pay | Admitting: Pulmonary Disease

## 2018-10-28 DIAGNOSIS — R918 Other nonspecific abnormal finding of lung field: Secondary | ICD-10-CM | POA: Diagnosis not present

## 2018-10-28 NOTE — Assessment & Plan Note (Signed)
We reviewed CT chest and again had a long discussion.  Left lower lobe opacity is resolved suggesting inflammatory cause. Right upper lobe opacity did decrease in size and I would still favor inflammatory cause but I explained to patient that there could still be a very small risk that this could be malignant.  We again discussed biopsy and risks of procedure including lung puncture requiring a chest tube.  He prefers to hold off on any type of biopsy. Another possibility is navigation biopsy which would require general anesthesia and would still carry the risk of lung puncture.  Again he prefers to hold off on any type of biopsy  He requested prednisone again and would be hesitant to give him prescription for long-term prednisone without a clear diagnosis. We agreed to repeat CT chest with contrast in 3 months

## 2018-10-28 NOTE — Patient Instructions (Signed)
CT chest with contrast in 3 months. We discussed biopsy procedure

## 2018-10-28 NOTE — Progress Notes (Signed)
   Subjective:    Patient ID: Christopher Rocha, male    DOB: Jan 28, 1941, 78 y.o.   MRN: [id number]  HPI  78 year old remote smoker for FU ofof right upper lobe lung infiltrate He underwent emergent CABG in 2014.He underwent TAVR for  severe aortic stenosis.He was found to have refractory anemia and underwent bone marrow biopsy on 9/10 and myelodysplastic syndrome is being considered. He smoked 2 packs/day until age 42 when he quit, more than 60 pack years. 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  Underwent bscopy >. BAL - pseudomonas, TBBXneg Last visit 08/2019, we gave him low-dose prednisone.  He felt remarkably improved with his aches and pains.  "I started living again".  He requests more prednisone today, His breathing is stable, continues to have cough with minimal white sputum.  Plavix has been started.  Lipitor was stopped and he was changed to Crestor 5 mg but aches and pains continue.  [facility], left lower lobe opacity has resolved, right upper lobe persists with airspace consolidation   Significant tests/ events reviewed  CT chest with contrast 05/20/2018 clarified this to be a5.3x 4.1cm masslike lesion with air bronchograms in the right upper lobe. There was a bandlike opacity extending towards the pleural from this mass was also patchy and bandlike opacities in the left lower lobe and medial right lower lobe. There is a 1 cm band was density in the medial right lung apex and a calcified granuloma in the liver lateral renal cysts.  CT chest  08/06/2018 - right upper lobe mass has decreased slightly from 5.3 x 4.7 x 4.1 cm to 4.9 x 4.5 x 3.4 cm, left lower lobe 2 cm density pleural-based seems to persist  PET 70/17/79 >>hypermetabolic RUL &LLL  PFTs 11/9028 ratio 54, FEV1 of 60%, FVC of 72% consistent with moderate airway obstruction, TLC 97%, DLCO 53%   Review of Systems neg for any significant sore throat,  dysphagia, itching, sneezing, nasal congestion or excess/ purulent secretions, fever, chills, sweats, unintended wt loss, pleuritic or exertional cp, hempoptysis, orthopnea pnd or change in chronic leg swelling. Also denies presyncope, palpitations, heartburn, abdominal pain, nausea, vomiting, diarrhea or change in bowel or urinary habits, dysuria,hematuria, rash, arthralgias, visual complaints, headache, numbness weakness or ataxia.      Objective:   Physical Exam   Gen. Pleasant, well-nourished, in no distress ENT - no thrush, no pallor/icterus,no post nasal drip Neck: No JVD, no thyromegaly, no carotid bruits Lungs: no use of accessory muscles, no dullness to percussion, clear without rales or rhonchi  Cardiovascular: Rhythm regular, heart sounds  normal, no murmurs or gallops, no peripheral edema Musculoskeletal: No deformities, no cyanosis or clubbing          Assessment & Plan:

## 2018-10-29 DIAGNOSIS — D509 Iron deficiency anemia, unspecified: Secondary | ICD-10-CM

## 2018-11-01 ENCOUNTER — Encounter: Payer: Self-pay | Admitting: Cardiology

## 2018-11-01 ENCOUNTER — Ambulatory Visit (INDEPENDENT_AMBULATORY_CARE_PROVIDER_SITE_OTHER): Payer: Medicare Other | Admitting: Cardiology

## 2018-11-01 VITALS — BP 152/76 | HR 59 | Ht 71.0 in | Wt 201.0 lb

## 2018-11-01 DIAGNOSIS — I10 Essential (primary) hypertension: Secondary | ICD-10-CM | POA: Diagnosis not present

## 2018-11-01 DIAGNOSIS — I2510 Atherosclerotic heart disease of native coronary artery without angina pectoris: Secondary | ICD-10-CM

## 2018-11-01 DIAGNOSIS — Z952 Presence of prosthetic heart valve: Secondary | ICD-10-CM | POA: Diagnosis not present

## 2018-11-01 DIAGNOSIS — Z951 Presence of aortocoronary bypass graft: Secondary | ICD-10-CM | POA: Diagnosis not present

## 2018-11-01 NOTE — Progress Notes (Signed)
Cardiology Office Note:    Date:  11/01/2018   ID:  [name], DOB Jan 13, 1941, MRN [id number]  PCP:  Christopher Hipps, MD  Cardiologist:  Christopher Lindau, MD   Referring MD: Christopher Hipps, MD    ASSESSMENT:    1. S/P TAVR (transcatheter aortic valve replacement)   2. Coronary artery disease involving native coronary artery of native heart without angina pectoris   3. Essential hypertension   4. Hx of CABG    PLAN:    In order of problems listed above:  1. Secondary prevention stressed with patient.  Importance of compliance with diet and medication stressed and he vocalized understanding.  His blood pressure is stable.  Diet was discussed for dyslipidemia.  Lipids were checked recently by primary care physician and they are following closely. 2.   Importance of regular exercise stressed. 3.   Patient will be seen in follow-up appointment in 6 months or earlier if the patient has any concerns    Medication Adjustments/Labs and Tests Ordered: Current medicines are reviewed at length with the patient today.  Concerns regarding medicines are outlined above.  No orders of the defined types were placed in this encounter.  No orders of the defined types were placed in this encounter.    No chief complaint on file.    History of Present Illness:    Christopher Rocha is a 78 y.o. male.  Patient has known coronary artery disease and had severe aortic stenosis for which he underwent TAVR surgery.  Patient denies any problems at this time.  He is walking on a regular basis.  He is being evaluated for a mass in the lungs and is contemplating biopsy.  He also sees Dr. Bobby Rocha for cancer specialist.  At the time of my evaluation, the patient is alert awake oriented and in no distress.  Past Medical History:  Diagnosis Date  . Anemia    iron deficiency   . Coronary artery disease   . HLD (hyperlipidemia)   . HTN (hypertension)   . Pulmonary nodule   . S/P CABG (coronary  artery bypass graft)   . S/P TAVR (transcatheter aortic valve replacement)    Edwards Sapien XT THV (size 29 mm, model # 9300TFX, serial # [id number])  . Severe aortic stenosis     Past Surgical History:  Procedure Laterality Date  . BONE MARROW ASPIRATION    . CARDIAC CATHETERIZATION    . CARDIAC SURGERY    . COLONOSCOPY    . CORONARY ARTERY BYPASS GRAFT    . EYE SURGERY     bilateral cataracts  . RIGHT/LEFT HEART CATH AND CORONARY/GRAFT ANGIOGRAPHY N/A 05/08/2018   Procedure: RIGHT/LEFT HEART CATH AND CORONARY/GRAFT ANGIOGRAPHY;  Surgeon: Christopher Hampshire, MD;  Location: [facility];  Service: Cardiovascular;  Laterality: N/A;  . TEE WITHOUT CARDIOVERSION  07/02/2018   Procedure: TRANSESOPHAGEAL ECHOCARDIOGRAM (TEE);  Surgeon: Christopher Mocha, MD;  Location: [facility];  Service: Open Heart Surgery;;  . TRANSCATHETER AORTIC VALVE REPLACEMENT, TRANSAPICAL N/A 07/02/2018   Procedure: TRANSCATHETER AORTIC VALVE REPLACEMENT, TRANSAPICAL;  Surgeon: Christopher Mocha, MD;  Location: [facility];  Service: Open Heart Surgery;  Laterality: N/A;  . VIDEO BRONCHOSCOPY Bilateral 05/31/2018   Procedure: VIDEO BRONCHOSCOPY WITH FLUORO;  Surgeon: Christopher Noel, MD;  Location: [facility];  Service: Cardiopulmonary;  Laterality: Bilateral;  . WISDOM TOOTH EXTRACTION      Current Medications: Current Meds  Medication Sig  . acetaminophen (TYLENOL) 325 MG  tablet Take 2 tablets (650 mg total) by mouth every 6 (six) hours as needed for mild pain.  Marland Kitchen aspirin EC 81 MG tablet Take 81 mg by mouth every other day.   Marland Kitchen atorvastatin (LIPITOR) 80 MG tablet Take 80 mg by mouth daily.   . budesonide-formoterol (SYMBICORT) 160-4.5 MCG/ACT inhaler Inhale 2 puffs into the lungs 2 (two) times daily.  Marland Kitchen guaiFENesin (MUCINEX) 600 MG 12 hr tablet Take 1 tablet (600 mg total) by mouth 2 (two) times daily as needed for cough or to loosen phlegm.  Marland Kitchen ibuprofen (ADVIL,MOTRIN) 200 MG tablet Take 400 mg by mouth every 6 (six) hours  as needed for headache or moderate pain.  Marland Kitchen losartan-hydrochlorothiazide (HYZAAR) 100-12.5 MG tablet Take 1 tablet by mouth daily.  . metoprolol tartrate (LOPRESSOR) 25 MG tablet Take 1 tablet (25 mg total) by mouth 2 (two) times daily.  . Naphazoline-Glycerin (REDNESS RELIEF OP) Place 1 drop into both eyes daily as needed (for redness/itching).  . rosuvastatin (CRESTOR) 5 MG tablet Take 5 mg by mouth every other day.  . traMADol (ULTRAM) 50 MG tablet Take 50 mg by mouth every 4-6 hours PRN severe pain     Allergies:   Penicillins   Social History   Socioeconomic History  . Marital status: Married    Spouse name: Not on file  . Number of children: Not on file  . Years of education: Not on file  . Highest education level: Not on file  Occupational History  . Not on file  Social Needs  . Financial resource strain: Not on file  . Food insecurity:    Worry: Not on file    Inability: Not on file  . Transportation needs:    Medical: Not on file    Non-medical: Not on file  Tobacco Use  . Smoking status: Former Research scientist (life sciences)  . Smokeless tobacco: Never Used  Substance and Sexual Activity  . Alcohol use: Not Currently    Frequency: Never  . Drug use: Never  . Sexual activity: Not on file  Lifestyle  . Physical activity:    Days per week: Not on file    Minutes per session: Not on file  . Stress: Not on file  Relationships  . Social connections:    Talks on phone: Not on file    Gets together: Not on file    Attends religious service: Not on file    Active member of club or organization: Not on file    Attends meetings of clubs or organizations: Not on file    Relationship status: Not on file  Other Topics Concern  . Not on file  Social History Narrative  . Not on file     Family History: The patient's family history includes Clotting disorder in his father; Diabetes in his mother.  ROS:   Please see the history of present illness.    All other systems reviewed and are  negative.  EKGs/Labs/Other Studies Reviewed:    The following studies were reviewed today: I discussed my findings with the patient at extensive length.   Recent Labs: 06/28/2018: ALT 21; B Natriuretic Peptide 175.5 07/04/2018: Magnesium 2.2 07/06/2018: Hemoglobin 8.6; Platelets 154 07/11/2018: BUN 17; Creatinine, Ser 1.03; Potassium 4.2; Sodium 143  Recent Lipid Panel No results found for: CHOL, TRIG, HDL, CHOLHDL, VLDL, LDLCALC, LDLDIRECT  Physical Exam:    VS:  BP (!) 152/76 (BP Location: Right Arm, Patient Position: Sitting, Cuff Size: Normal)   Pulse (!) 59  Ht 5\' 11"  (1.803 m)   Wt 201 lb (91.2 kg)   SpO2 98%   BMI 28.03 kg/m     Wt Readings from Last 3 Encounters:  11/01/18 201 lb (91.2 kg)  10/28/18 200 lb 12.8 oz (91.1 kg)  08/15/18 190 lb (86.2 kg)     GEN: Patient is in no acute distress HEENT: Normal NECK: No JVD; No carotid bruits LYMPHATICS: No lymphadenopathy CARDIAC: Hear sounds regular, 2/6 systolic murmur at the apex. RESPIRATORY:  Clear to auscultation without rales, wheezing or rhonchi  ABDOMEN: Soft, non-tender, non-distended MUSCULOSKELETAL:  No edema; No deformity  SKIN: Warm and dry NEUROLOGIC:  Alert and oriented x 3 PSYCHIATRIC:  Normal affect   Signed, Christopher Lindau, MD  11/01/2018 9:06 AM    [place]

## 2018-11-01 NOTE — Patient Instructions (Signed)
Medication Instructions:  Your physician recommends that you continue on your current medications as directed. Please refer to the Current Medication list given to you today.  If you need a refill on your cardiac medications before your next appointment, please call your pharmacy.   Lab work: None   If you have labs (blood work) drawn today and your tests are completely normal, you will receive your results only by: Marland Kitchen MyChart Message (if you have MyChart) OR . A paper copy in the mail If you have any lab test that is abnormal or we need to change your treatment, we will call you to review the results.  Testing/Procedures: EKG today in office.   Follow-Up: At [facility], you and your health needs are our priority.  As part of our continuing mission to provide you with exceptional heart care, we have created designated Provider Care Teams.  These Care Teams include your primary Cardiologist (physician) and Advanced Practice Providers (APPs -  Physician Assistants and Nurse Practitioners) who all work together to provide you with the care you need, when you need it. You will need a follow up appointment in 6 months.  Please call our office 2 months in advance to schedule this appointment.  You may see Dr. Geraldo Pitter or another member of our [employer] Provider Team in [place]: Jenne Campus, MD . Shirlee More, MD

## 2018-11-18 DIAGNOSIS — E538 Deficiency of other specified B group vitamins: Secondary | ICD-10-CM | POA: Diagnosis not present

## 2018-12-18 ENCOUNTER — Telehealth: Payer: Self-pay | Admitting: Pulmonary Disease

## 2018-12-18 DIAGNOSIS — E538 Deficiency of other specified B group vitamins: Secondary | ICD-10-CM | POA: Diagnosis not present

## 2018-12-18 MED ORDER — BUDESONIDE-FORMOTEROL FUMARATE 160-4.5 MCG/ACT IN AERO: 2.0000 | INHALATION_SPRAY | Freq: Two times a day (BID) | RESPIRATORY_TRACT | 5 refills | Status: DC

## 2018-12-18 NOTE — Telephone Encounter (Signed)
Rx symbicort 160 sent to pt's preferred pharmacy. Called and spoke with pt letting him know this had been done. Pt expressed understanding. Nothing further needed.

## 2018-12-22 DIAGNOSIS — R1084 Generalized abdominal pain: Secondary | ICD-10-CM | POA: Diagnosis not present

## 2019-01-23 DIAGNOSIS — J180 Bronchopneumonia, unspecified organism: Secondary | ICD-10-CM | POA: Diagnosis not present

## 2019-01-28 ENCOUNTER — Ambulatory Visit: Payer: Medicare Other | Admitting: Pulmonary Disease

## 2019-01-30 ENCOUNTER — Encounter: Payer: Self-pay | Admitting: Pulmonary Disease

## 2019-01-30 DIAGNOSIS — K449 Diaphragmatic hernia without obstruction or gangrene: Secondary | ICD-10-CM | POA: Diagnosis not present

## 2019-01-30 DIAGNOSIS — R918 Other nonspecific abnormal finding of lung field: Secondary | ICD-10-CM | POA: Diagnosis not present

## 2019-01-30 DIAGNOSIS — J439 Emphysema, unspecified: Secondary | ICD-10-CM | POA: Diagnosis not present

## 2019-01-30 DIAGNOSIS — I700 Atherosclerosis of aorta: Secondary | ICD-10-CM | POA: Diagnosis not present

## 2019-02-04 ENCOUNTER — Other Ambulatory Visit: Payer: Self-pay

## 2019-02-04 ENCOUNTER — Telehealth: Payer: Self-pay | Admitting: Pulmonary Disease

## 2019-02-04 ENCOUNTER — Encounter: Payer: Self-pay | Admitting: Pulmonary Disease

## 2019-02-04 ENCOUNTER — Ambulatory Visit (INDEPENDENT_AMBULATORY_CARE_PROVIDER_SITE_OTHER): Payer: Medicare Other | Admitting: Pulmonary Disease

## 2019-02-04 DIAGNOSIS — R918 Other nonspecific abnormal finding of lung field: Secondary | ICD-10-CM | POA: Diagnosis not present

## 2019-02-04 NOTE — Patient Instructions (Signed)
  Proceed with CT-guided needle biopsy of right upper lobe infiltrate We discussed risks and benefits

## 2019-02-04 NOTE — Telephone Encounter (Signed)
Patient scheduled at 1130, 02/04/19, with Dr Elsworth Soho.

## 2019-02-04 NOTE — Telephone Encounter (Signed)
Called and spoke with Patient's wife.  Patient was not home at that time.  Per Dr Elsworth Soho, he would like to schedule telephone visit today after 1030 OV with Patient to discuss things. Patient wife has message.  Patient is to call back as soon as he gets home this morning to schedule telephone visit with Dr Elsworth Soho today.

## 2019-02-04 NOTE — Progress Notes (Signed)
I connected with  Silverio Lay on 02/04/19 by a phone visit and verified that I am speaking with the correct person using two identifiers.   I discussed the limitations of evaluation and management by telemedicine. The patient expressed understanding and agreed to proceed.  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 He smoked 2 packs/day until age 22 when he quit, more than 60 pack years. 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  05/2018 bscopy >. BAL - pseudomonas, TBBXneg  We discussed results of CT chest 01/28/2019, I personally reviewed images the right upper lobe opacity has slightly increased in size from 4.8 x 1.3 cm to 5.1 x 4.7 cm.  It has a 3.8 cm solid component that is unchanged.  There was a new mild patchy tree-in-bud opacities in the left lower lobe  He was treated in the past with prednisone and this seems to have helped.  He again got a prescription for prednisone from his PCP.  He now denies shortness of breath or wheezing   Significant tests/ events reviewed  CT chest with contrast 05/20/2018 clarified this to be a5.3x 4.1cm masslike lesion with air bronchograms in the right upper lobe. There was a bandlike opacity extending towards the pleural from this mass was also patchy and bandlike opacities in the left lower lobe and medial right lower lobe. There is a 1 cm band was density in the medial right lung apex and a calcified granuloma in the liver lateral renal cysts.  CT chest  08/06/2018 - right upper lobe mass has decreased slightly from 5.3 x 4.7 x 4.1 cm to 4.9 x 4.5 x 3.4 cm, left lower lobe 2 cm density pleural-based seems to persist  PET 46/50/35 >>hypermetabolic RUL &LLL  CT chest 10/2018 left lower lobe opacity has  resolved, right upper lobe persists with airspace consolidation  PFTs 05/2018 ratio 54, FEV1 of 60%, FVC of 72% consistent with moderate airway obstruction, TLC 97%, DLCO 53%

## 2019-02-04 NOTE — Assessment & Plan Note (Signed)
This seems to have slightly decreased on the prior CAT scan 10/2018 but now has increased in size although the central solid component is unchanged.  He is more receptive to the idea of a biopsy now while on his last visit he was not receptive at all.  I think it would be best to proceed with transthoracic CT-guided needle aspiration biopsy.  We discussed risks of the procedure including that of lung puncture requiring chest tube placement. He also has mild infiltrates left lower lobe which could indicate aspiration but this right upper lobe opacity has persisted now for quite some time

## 2019-02-05 ENCOUNTER — Telehealth: Payer: Self-pay | Admitting: Pulmonary Disease

## 2019-02-05 NOTE — Telephone Encounter (Signed)
Called and spoke with Christopher Rocha, [facility] medical records. CT chest was done 01/2019, at [facility].  CT chest report being faxed to [facility] at [phone].  Nothing further at this time.

## 2019-02-05 NOTE — Telephone Encounter (Signed)
Dr Elsworth Soho requested a telephone ov with Patient 02/04/19, to go over CT chest results.  Called and added Patient to Dr Bari Mantis schedule. Dr Elsworth Soho had CT chest results that he went over with Patient.  Dr Elsworth Soho requested CT guided biopsy to be ordered.  Message routed to [place], [facility]

## 2019-02-05 NOTE — Telephone Encounter (Signed)
Christopher Rocha is asking what images is Dr Elsworth Soho using to base the CT Guided Biopsy off of.  Per note pt had CT at [place] and had to leave her a vm letting her know this info & told her if she needed any additional info to call me back or call & leave a message for triage.  I will close this message out.

## 2019-02-12 ENCOUNTER — Other Ambulatory Visit (HOSPITAL_COMMUNITY): Payer: Self-pay | Admitting: Pulmonary Disease

## 2019-02-12 ENCOUNTER — Ambulatory Visit
Admission: RE | Admit: 2019-02-12 | Discharge: 2019-02-12 | Disposition: A | Discharge status: Home / Self Care | Payer: Self-pay | Source: Ambulatory Visit | Attending: Pulmonary Disease | Admitting: Pulmonary Disease

## 2019-02-12 DIAGNOSIS — R52 Pain, unspecified: Secondary | ICD-10-CM

## 2019-02-17 DIAGNOSIS — E538 Deficiency of other specified B group vitamins: Secondary | ICD-10-CM | POA: Diagnosis not present

## 2019-02-18 ENCOUNTER — Telehealth: Payer: Self-pay | Admitting: Pulmonary Disease

## 2019-02-18 DIAGNOSIS — R911 Solitary pulmonary nodule: Secondary | ICD-10-CM

## 2019-02-18 NOTE — Telephone Encounter (Signed)
CT chest with Super D order for 4 weeks.. Nothing further needed.

## 2019-02-18 NOTE — Telephone Encounter (Signed)
Discussed with Dr. Kathlene Cote from interventional radiology He feels that CT-guided biopsy is a very high risk of bleeding, also would need an anterior approach and have to traverse through a lot of lung so definite risk of pneumothorax.  I discussed with the patient and offered him repeat bronchoscopy under general anesthesia with navigation.  He would prefer to hold off and follow-up imaging  Plan for repeat CT chest with contrast-super D imaging protocol in 4 weeks-please schedule

## 2019-02-19 ENCOUNTER — Other Ambulatory Visit: Payer: Self-pay | Admitting: *Deleted

## 2019-02-19 DIAGNOSIS — R918 Other nonspecific abnormal finding of lung field: Secondary | ICD-10-CM

## 2019-02-19 NOTE — Progress Notes (Signed)
bmm

## 2019-03-19 ENCOUNTER — Other Ambulatory Visit: Payer: Medicare Other

## 2019-03-19 DIAGNOSIS — R918 Other nonspecific abnormal finding of lung field: Secondary | ICD-10-CM

## 2019-03-19 DIAGNOSIS — E538 Deficiency of other specified B group vitamins: Secondary | ICD-10-CM | POA: Diagnosis not present

## 2019-03-19 LAB — BASIC METABOLIC PANEL
BUN: 18 mg/dL (ref 6–23)
CO2: 27 mEq/L (ref 19–32)
Calcium: 9.9 mg/dL (ref 8.4–10.5)
Chloride: 102 mEq/L (ref 96–112)
Creatinine, Ser: 1.11 mg/dL (ref 0.40–1.50)
GFR: 64.04 mL/min (ref >60.00)
Glucose, Bld: 150 mg/dL — ABNORMAL HIGH (ref 70–99)
Potassium: 4.3 mEq/L (ref 3.5–5.1)
Sodium: 137 mEq/L (ref 135–145)

## 2019-03-19 NOTE — Addendum Note (Signed)
Addended by: Dolores Lory on: 03/19/2019 02:00 PM   Modules accepted: Orders

## 2019-03-20 ENCOUNTER — Ambulatory Visit (INDEPENDENT_AMBULATORY_CARE_PROVIDER_SITE_OTHER)
Admission: RE | Admit: 2019-03-20 | Discharge: 2019-03-20 | Disposition: A | Discharge status: Home / Self Care | Payer: Medicare Other | Source: Ambulatory Visit | Attending: Pulmonary Disease | Admitting: Pulmonary Disease

## 2019-03-20 ENCOUNTER — Other Ambulatory Visit: Payer: Self-pay

## 2019-03-20 DIAGNOSIS — R911 Solitary pulmonary nodule: Secondary | ICD-10-CM | POA: Diagnosis not present

## 2019-03-20 DIAGNOSIS — R918 Other nonspecific abnormal finding of lung field: Secondary | ICD-10-CM | POA: Diagnosis not present

## 2019-03-20 MED ORDER — IOHEXOL 300 MG/ML  SOLN
80.0000 mL | Freq: Once | INTRAMUSCULAR | Status: AC | PRN
  Administered 2019-03-20: 80 mL via INTRAVENOUS

## 2019-03-25 ENCOUNTER — Telehealth: Payer: Self-pay | Admitting: Pulmonary Disease

## 2019-03-25 DIAGNOSIS — R918 Other nonspecific abnormal finding of lung field: Secondary | ICD-10-CM

## 2019-03-25 NOTE — Telephone Encounter (Signed)
Hey Dr Elsworth Soho do you have any day or time preference for this enb

## 2019-03-25 NOTE — Telephone Encounter (Signed)
Discussed CT results with patient. We will proceed with repeat bronchoscopy and biopsy under ENB and possibly biopsy of right paratracheal lymph node. Discussed risks of general anesthesia and pneumothorax and he is agreeable to proceed Please schedule and obtain super D CT chest on a disc for planning

## 2019-04-01 NOTE — Progress Notes (Signed)
[facility] [address], [place] [address] Phone: [phone] Fax: [phone]      Your procedure is scheduled on August 4  Report to [facility] Main Entrance "A" at [place].M., and check in at the Admitting office.  Call this number if you have problems the morning of surgery:  [phone]  Call [phone] if you have any questions prior to your surgery date Monday-Friday 8am-4pm    Remember:  Do not eat or drink after midnight the night before your surgery   Take these medicines the morning of surgery with A SIP OF WATER  budesonide-formoterol (SYMBICORT) Please bring all inhalers with you the day of surgery.  metoprolol tartrate (LOPRESSOR) if taking Eye drops if needed PROAIR HFA 108 (90 Base)   7 days prior to surgery STOP taking any Aspirin (unless otherwise instructed by your surgeon), Aleve, Naproxen, Ibuprofen, Motrin, Advil, Goody's, BC's, all herbal medications, fish oil, and all vitamins.    The Morning of Surgery  Do not wear jewelry  Do not wear lotions, powders, or colognes, or deodorant  Men may shave face and neck.  Do not bring valuables to the hospital.  [facility] is not responsible for any belongings or valuables.  If you are a smoker, DO NOT Smoke 24 hours prior to surgery IF you wear a CPAP at night please bring your mask, tubing, and machine the morning of surgery   Remember that you must have someone to transport you home after your surgery, and remain with you for 24 hours if you are discharged the same day.   Contacts, glasses, hearing aids, dentures or bridgework may not be worn into surgery.    Leave your suitcase in the car.  After surgery it may be brought to your room.  For patients admitted to the hospital, discharge time will be determined by your treatment team.  Patients discharged the day of surgery will not be allowed to drive home.    Special instructions:    [facility]- Preparing For Surgery  Before surgery, you can play an important role. Because skin is not sterile, your skin needs to be as free of germs as possible. You can reduce the number of germs on your skin by washing with CHG (chlorahexidine gluconate) Soap before surgery.  CHG is an antiseptic cleaner which kills germs and bonds with the skin to continue killing germs even after washing.    Oral Hygiene is also important to reduce your risk of infection.  Remember - BRUSH YOUR TEETH THE MORNING OF SURGERY WITH YOUR REGULAR TOOTHPASTE  Please do not use if you have an allergy to CHG or antibacterial soaps. If your skin becomes reddened/irritated stop using the CHG.  Do not shave (including legs and underarms) for at least 48 hours prior to first CHG shower. It is OK to shave your face.  Please follow these instructions carefully.   1. Shower the NIGHT BEFORE SURGERY and the MORNING OF SURGERY with CHG Soap.   2. If you chose to wash your hair, wash your hair first as usual with your normal shampoo.  3. After you shampoo, rinse your hair and body thoroughly to remove the shampoo.  4. Use CHG as you would any other liquid soap. You can apply CHG directly to the skin and wash gently with a scrungie or a clean washcloth.   5. Apply the CHG Soap to your body ONLY FROM THE  NECK DOWN.  Do not use on open wounds or open sores. Avoid contact with your eyes, ears, mouth and genitals (private parts). Wash Face and genitals (private parts)  with your normal soap.   6. Wash thoroughly, paying special attention to the area where your surgery will be performed.  7. Thoroughly rinse your body with warm water from the neck down.  8. DO NOT shower/wash with your normal soap after using and rinsing off the CHG Soap.  9. Pat yourself dry with a CLEAN TOWEL.  10. Wear CLEAN PAJAMAS to bed the night before surgery, wear comfortable clothes the morning of surgery  11. Place CLEAN SHEETS on your bed  the night of your first shower and DO NOT SLEEP WITH PETS.    Day of Surgery:  Do not apply any deodorants/lotions. Please shower the morning of surgery with the CHG soap  Please wear clean clothes to the hospital/surgery center.   Remember to brush your teeth WITH YOUR REGULAR TOOTHPASTE.   Please read over the following fact sheets that you were given.

## 2019-04-02 ENCOUNTER — Encounter (HOSPITAL_COMMUNITY)
Admission: RE | Admit: 2019-04-02 | Discharge: 2019-04-02 | Disposition: A | Discharge status: Home / Self Care | Payer: Medicare Other | Source: Ambulatory Visit | Attending: Pulmonary Disease | Admitting: Pulmonary Disease

## 2019-04-02 ENCOUNTER — Encounter (HOSPITAL_COMMUNITY): Payer: Self-pay

## 2019-04-02 ENCOUNTER — Other Ambulatory Visit: Payer: Self-pay

## 2019-04-02 DIAGNOSIS — Z01812 Encounter for preprocedural laboratory examination: Secondary | ICD-10-CM | POA: Insufficient documentation

## 2019-04-02 DIAGNOSIS — Z20828 Contact with and (suspected) exposure to other viral communicable diseases: Secondary | ICD-10-CM | POA: Diagnosis not present

## 2019-04-02 LAB — BASIC METABOLIC PANEL
Anion gap: 8 (ref 5–15)
BUN: 14 mg/dL (ref 8–23)
CO2: 25 mmol/L (ref 22–32)
Calcium: 9.7 mg/dL (ref 8.9–10.3)
Chloride: 105 mmol/L (ref 98–111)
Creatinine, Ser: 1.13 mg/dL (ref 0.61–1.24)
GFR calc Af Amer: >60 mL/min (ref >60)
GFR calc non Af Amer: >60 mL/min (ref >60)
Glucose, Bld: 147 mg/dL — ABNORMAL HIGH (ref 70–99)
Potassium: 4.5 mmol/L (ref 3.5–5.1)
Sodium: 138 mmol/L (ref 135–145)

## 2019-04-02 LAB — CBC
HCT: 39.4 % (ref 39.0–52.0)
Hemoglobin: 12.6 g/dL — ABNORMAL LOW (ref 13.0–17.0)
MCH: 31 pg (ref 26.0–34.0)
MCHC: 32 g/dL (ref 30.0–36.0)
MCV: 97 fL (ref 80.0–100.0)
Platelets: 204 10*3/uL (ref 150–400)
RBC: 4.06 MIL/uL — ABNORMAL LOW (ref 4.22–5.81)
RDW: 13.2 % (ref 11.5–15.5)
WBC: 7.8 10*3/uL (ref 4.0–10.5)
nRBC: 0 % (ref 0.0–0.2)

## 2019-04-02 NOTE — Progress Notes (Addendum)
PCP - [name] Cardiologist - Dr. Jyl Heinz Pulmonlogist - Dr. Kara Mead  Chest x-ray - denies EKG - 11/01/2018 Stress Test -  denies ECHO - 08/15/18 Cardiac Cath - 05/08/18  Sleep Study - denies CPAP - N/A  Blood Thinner Instructions: N/A Aspirin Instructions: Patient stated "no instructions given". Patient instructed to call surgeon's office for further instructions  Anesthesia review: YES, heart hx, CAD hx,   Coronavirus Screening  Have you experienced the following symptoms:  Cough yes/no: No Fever (>100.60F)  yes/no: No Runny nose yes/no: No Sore throat yes/no: No Difficulty breathing/shortness of breath  yes/no: No  Have you or a family member traveled in the last 14 days and where? yes/no: No  If the patient indicates "YES" to the above questions, their PAT will be rescheduled to limit the exposure to others and, the surgeon will be notified. THE PATIENT WILL NEED TO BE ASYMPTOMATIC FOR 14 DAYS.   If the patient is not experiencing any of these symptoms, the PAT nurse will instruct them to NOT bring anyone with them to their appointment since they may have these symptoms or traveled as well.   Please remind your patients and families that hospital visitation restrictions are in effect and the importance of the restrictions.   Patient denies shortness of breath, fever, cough and chest pain at PAT appointment  Patient verbalized understanding of instructions that were given to them at the PAT appointment. Patient was also instructed that they will need to review over the PAT instructions again at home before surgery.

## 2019-04-02 NOTE — Progress Notes (Signed)
Elevated BP at PAT appointment, 217/71. No c/o headaches, blurry vision, chest pain, or dizziness. BP rechecked 246/80. Anesthesia PA- C made aware. BP then rechecked manually, BP 190/85.  Patient instructed to take BP medicine as patient returns home and to notify PCP about elevated BP. Patient verbalized understanding.

## 2019-04-03 NOTE — Anesthesia Preprocedure Evaluation (Addendum)
Anesthesia Evaluation  Patient identified by MRN, date of birth, ID band Patient awake    Reviewed: Allergy & Precautions, NPO status , Patient's Chart, lab work & pertinent test results, reviewed documented beta blocker date and time   History of Anesthesia Complications Negative for: history of anesthetic complications  Airway Mallampati: II  TM Distance: >3 FB Neck ROM: Full    Dental  (+) Dental Advisory Given   Pulmonary COPD,  COPD inhaler, former smoker,    breath sounds clear to auscultation       Cardiovascular hypertension, Pt. on medications and Pt. on home beta blockers + CAD and + CABG  + Valvular Problems/Murmurs  Rhythm:Regular     Neuro/Psych negative neurological ROS  negative psych ROS   GI/Hepatic negative GI ROS,   Endo/Other  negative endocrine ROS  Renal/GU negative Renal ROS     Musculoskeletal   Abdominal   Peds  Hematology  (+) Blood dyscrasia, anemia ,   Anesthesia Other Findings S/P TAVR (transcatheter aortic valve replacement)  - Procedure narrative: Transthoracic echocardiography. Image   quality was suboptimal. The study was technically difficult.   Intravenous contrast (Definity) was administered to opacify the   LV. - Left ventricle: The cavity size was normal. Wall thickness was   increased in a pattern of moderate LVH. Systolic function was   normal. The estimated ejection fraction was in the range of 60%   to 65%. Wall motion was normal; there were no regional wall   motion abnormalities. - Aortic valve: A bioprosthesis was present. Mean gradient (S): 13   mm Hg. Peak gradient (S): 27 mm Hg. - Mitral valve: There was mild regurgitation. - Left atrium: The atrium was mildly dilated.   Reproductive/Obstetrics                           Anesthesia Physical Anesthesia Plan  ASA: III  Anesthesia Plan: General   Post-op Pain Management:     Induction: Intravenous  PONV Risk Score and Plan: 2 and Ondansetron and Dexamethasone  Airway Management Planned: Oral ETT  Additional Equipment: None  Intra-op Plan:   Post-operative Plan: Extubation in OR  Informed Consent: I have reviewed the patients History and Physical, chart, labs and discussed the procedure including the risks, benefits and alternatives for the proposed anesthesia with the patient or authorized representative who has indicated his/her understanding and acceptance.     Dental advisory given  Plan Discussed with: CRNA and Surgeon  Anesthesia Plan Comments: (Follows with cardiology for CAD (s/p CABG 2014) and severe AS s/p TAVR 07/02/18. Last seen by Dr. Geraldo Pitter 11/01/18, doing well, walking regularly. 42mo fu recommended.  At PAT he was noted to have significantly elevated BP, 190/85.  He said he had not taken his antiHTN meds. Review of last office visit with Dr. Geraldo Pitter and Dr. Elsworth Soho shows BPs of 152/76 and 138/80. He was asymptomatic, denied CP, SOB, HA. He was advised to take meds when he got home and recheck BP. If not improving he is to call PCP and/or Dr. Geraldo Pitter. Advised that if it remained that elevated it could be prohibitive on DOS.  BP Readings from Last 3 Encounters: 04/02/19 : (!) 190/85 11/01/18 : (!) 152/76 10/28/18 : 138/80  PFTs 05/2018 ratio 54, FEV1 of 60%, FVC of 72% consistent with moderate airway obstruction, TLC 97%, DLCO 53%.  TTE 08/15/18: - Procedure narrative: Transthoracic echocardiography. Image   quality was suboptimal. The study  was technically difficult.   Intravenous contrast (Definity) was administered to opacify the   LV. - Left ventricle: The cavity size was normal. Wall thickness was   increased in a pattern of moderate LVH. Systolic function was   normal. The estimated ejection fraction was in the range of 60%   to 65%. Wall motion was normal; there were no regional wall   motion abnormalities. - Aortic valve: A  bioprosthesis was present. Mean gradient (S): 13   mm Hg. Peak gradient (S): 27 mm Hg. - Mitral valve: There was mild regurgitation. - Left atrium: The atrium was mildly dilated.  Cath 05/08/18 (Pt subsequently had TAVR 07/02/18): 1.  Significant left main and two-vessel coronary artery disease involving the LAD and left circumflex.  Patent LIMA to LAD and SVG to OM1 supplying the whole left circumflex distribution.  Moderate proximal RCA stenosis with no obstructive disease. 2.  Right heart catheterization showed mildly elevated left-sided filling pressures, mild pulmonary hypertension and normal cardiac output.  Prominent V wave on pulmonary capillary wedge pressure tracing suggestive of mitral regurgitation 3.  Severe aortic stenosis by echocardiogram.  I did not attempt to cross the aortic valve.  Recommendations: Recommend evaluation at the valve clinic for possible TAVR.  In spite of presence of V waves on pulmonary  wedge pressure tracings, I reviewed the echocardiogram and I do not think the mitral regurgitation is more than moderate.  There does seem to be mild degree of mitral stenosis. The aortic valve is heavily calcified with severely restricted opening. )       Anesthesia Quick Evaluation

## 2019-04-05 ENCOUNTER — Other Ambulatory Visit (HOSPITAL_COMMUNITY)
Admission: RE | Admit: 2019-04-05 | Discharge: 2019-04-05 | Disposition: A | Discharge status: Home / Self Care | Payer: Medicare Other | Source: Ambulatory Visit | Attending: Pulmonary Disease | Admitting: Pulmonary Disease

## 2019-04-05 DIAGNOSIS — Z01812 Encounter for preprocedural laboratory examination: Secondary | ICD-10-CM | POA: Diagnosis not present

## 2019-04-05 LAB — SARS CORONAVIRUS 2 (TAT 6-24 HRS): SARS Coronavirus 2: NEGATIVE

## 2019-04-07 ENCOUNTER — Other Ambulatory Visit: Payer: Self-pay | Admitting: Pulmonary Disease

## 2019-04-08 ENCOUNTER — Encounter (HOSPITAL_COMMUNITY): Payer: Self-pay

## 2019-04-08 ENCOUNTER — Ambulatory Visit (HOSPITAL_COMMUNITY): Payer: Medicare Other | Admitting: Physician Assistant

## 2019-04-08 ENCOUNTER — Ambulatory Visit (HOSPITAL_COMMUNITY)
Admission: RE | Admit: 2019-04-08 | Discharge: 2019-04-08 | Disposition: A | Discharge status: Home / Self Care | Payer: Medicare Other | Attending: Pulmonary Disease | Admitting: Pulmonary Disease

## 2019-04-08 ENCOUNTER — Ambulatory Visit (HOSPITAL_COMMUNITY): Payer: Medicare Other | Admitting: Certified Registered Nurse Anesthetist

## 2019-04-08 ENCOUNTER — Ambulatory Visit (HOSPITAL_COMMUNITY): Payer: Medicare Other

## 2019-04-08 ENCOUNTER — Other Ambulatory Visit: Payer: Self-pay

## 2019-04-08 ENCOUNTER — Encounter (HOSPITAL_COMMUNITY)
Admission: RE | Disposition: A | Discharge status: Home / Self Care | Payer: Self-pay | Source: Home / Self Care | Attending: Pulmonary Disease

## 2019-04-08 DIAGNOSIS — J449 Chronic obstructive pulmonary disease, unspecified: Secondary | ICD-10-CM | POA: Diagnosis not present

## 2019-04-08 DIAGNOSIS — Z9889 Other specified postprocedural states: Secondary | ICD-10-CM | POA: Diagnosis not present

## 2019-04-08 DIAGNOSIS — Z952 Presence of prosthetic heart valve: Secondary | ICD-10-CM | POA: Insufficient documentation

## 2019-04-08 DIAGNOSIS — R846 Abnormal cytological findings in specimens from respiratory organs and thorax: Secondary | ICD-10-CM | POA: Diagnosis not present

## 2019-04-08 DIAGNOSIS — Z419 Encounter for procedure for purposes other than remedying health state, unspecified: Secondary | ICD-10-CM

## 2019-04-08 DIAGNOSIS — R918 Other nonspecific abnormal finding of lung field: Secondary | ICD-10-CM | POA: Diagnosis not present

## 2019-04-08 DIAGNOSIS — I5033 Acute on chronic diastolic (congestive) heart failure: Secondary | ICD-10-CM | POA: Diagnosis not present

## 2019-04-08 DIAGNOSIS — Z79899 Other long term (current) drug therapy: Secondary | ICD-10-CM | POA: Diagnosis not present

## 2019-04-08 DIAGNOSIS — I110 Hypertensive heart disease with heart failure: Secondary | ICD-10-CM | POA: Diagnosis not present

## 2019-04-08 DIAGNOSIS — Z87891 Personal history of nicotine dependence: Secondary | ICD-10-CM | POA: Insufficient documentation

## 2019-04-08 DIAGNOSIS — Z951 Presence of aortocoronary bypass graft: Secondary | ICD-10-CM | POA: Diagnosis not present

## 2019-04-08 DIAGNOSIS — Z7982 Long term (current) use of aspirin: Secondary | ICD-10-CM | POA: Diagnosis not present

## 2019-04-08 DIAGNOSIS — R848 Other abnormal findings in specimens from respiratory organs and thorax: Secondary | ICD-10-CM | POA: Diagnosis not present

## 2019-04-08 DIAGNOSIS — I2510 Atherosclerotic heart disease of native coronary artery without angina pectoris: Secondary | ICD-10-CM | POA: Insufficient documentation

## 2019-04-08 DIAGNOSIS — I10 Essential (primary) hypertension: Secondary | ICD-10-CM | POA: Diagnosis not present

## 2019-04-08 HISTORY — PX: VIDEO BRONCHOSCOPY WITH ENDOBRONCHIAL NAVIGATION: SHX6175

## 2019-04-08 HISTORY — PX: VIDEO BRONCHOSCOPY WITH ENDOBRONCHIAL ULTRASOUND: SHX6177

## 2019-04-08 HISTORY — PX: FIBEROPTIC BRONCHOSCOPY: SHX5367

## 2019-04-08 SURGERY — VIDEO BRONCHOSCOPY WITH ENDOBRONCHIAL NAVIGATION
Anesthesia: General

## 2019-04-08 MED ORDER — LIDOCAINE 2% (20 MG/ML) 5 ML SYRINGE
INTRAMUSCULAR | Status: DC | PRN
  Administered 2019-04-08: 100 mg via INTRAVENOUS

## 2019-04-08 MED ORDER — METOPROLOL TARTRATE 5 MG/5ML IV SOLN
INTRAVENOUS | Status: AC
  Filled 2019-04-08: qty 5

## 2019-04-08 MED ORDER — LABETALOL HCL 5 MG/ML IV SOLN
10.0000 mg | Freq: Once | INTRAVENOUS | Status: AC
  Administered 2019-04-08: 08:00:00 10 mg via INTRAVENOUS

## 2019-04-08 MED ORDER — FENTANYL CITRATE (PF) 250 MCG/5ML IJ SOLN
INTRAMUSCULAR | Status: AC
  Filled 2019-04-08: qty 5

## 2019-04-08 MED ORDER — FENTANYL CITRATE (PF) 100 MCG/2ML IJ SOLN
INTRAMUSCULAR | Status: DC | PRN
  Administered 2019-04-08 (×2): 50 ug via INTRAVENOUS

## 2019-04-08 MED ORDER — ACETAMINOPHEN 160 MG/5ML PO SOLN: 1000.0000 mg | Freq: Once | ORAL | Status: DC | PRN

## 2019-04-08 MED ORDER — ONDANSETRON HCL 4 MG/2ML IJ SOLN
INTRAMUSCULAR | Status: DC | PRN
  Administered 2019-04-08: 4 mg via INTRAVENOUS

## 2019-04-08 MED ORDER — OXYCODONE HCL 5 MG PO TABS: 5.0000 mg | ORAL_TABLET | Freq: Once | ORAL | Status: DC | PRN

## 2019-04-08 MED ORDER — LACTATED RINGERS IV SOLN
INTRAVENOUS | Status: DC | PRN
  Administered 2019-04-08: 07:00:00 via INTRAVENOUS

## 2019-04-08 MED ORDER — OXYCODONE HCL 5 MG/5ML PO SOLN: 5.0000 mg | Freq: Once | ORAL | Status: DC | PRN

## 2019-04-08 MED ORDER — PROPOFOL 10 MG/ML IV BOLUS
INTRAVENOUS | Status: DC | PRN
  Administered 2019-04-08: 110 mg via INTRAVENOUS

## 2019-04-08 MED ORDER — SUCCINYLCHOLINE CHLORIDE 200 MG/10ML IV SOSY
PREFILLED_SYRINGE | INTRAVENOUS | Status: DC | PRN
  Administered 2019-04-08: 100 mg via INTRAVENOUS

## 2019-04-08 MED ORDER — ACETAMINOPHEN 500 MG PO TABS: 1000.0000 mg | ORAL_TABLET | Freq: Once | ORAL | Status: DC | PRN

## 2019-04-08 MED ORDER — GLYCOPYRROLATE PF 0.2 MG/ML IJ SOSY
PREFILLED_SYRINGE | INTRAMUSCULAR | Status: AC
  Filled 2019-04-08: qty 1

## 2019-04-08 MED ORDER — LABETALOL HCL 5 MG/ML IV SOLN
INTRAVENOUS | Status: AC
  Administered 2019-04-08: 08:00:00 10 mg via INTRAVENOUS
  Filled 2019-04-08: qty 4

## 2019-04-08 MED ORDER — 0.9 % SODIUM CHLORIDE (POUR BTL) OPTIME
TOPICAL | Status: DC | PRN
  Administered 2019-04-08: 1000 mL

## 2019-04-08 MED ORDER — GLYCOPYRROLATE PF 0.2 MG/ML IJ SOSY
PREFILLED_SYRINGE | INTRAMUSCULAR | Status: DC | PRN
  Administered 2019-04-08: .2 mg via INTRAVENOUS

## 2019-04-08 MED ORDER — ROCURONIUM BROMIDE 10 MG/ML (PF) SYRINGE
PREFILLED_SYRINGE | INTRAVENOUS | Status: AC
  Filled 2019-04-08: qty 10

## 2019-04-08 MED ORDER — SUGAMMADEX SODIUM 200 MG/2ML IV SOLN
INTRAVENOUS | Status: DC | PRN
  Administered 2019-04-08: 200 mg via INTRAVENOUS

## 2019-04-08 MED ORDER — SODIUM CHLORIDE 0.9 % IV SOLN
INTRAVENOUS | Status: DC | PRN
  Administered 2019-04-08: 50 ug/min via INTRAVENOUS

## 2019-04-08 MED ORDER — FENTANYL CITRATE (PF) 100 MCG/2ML IJ SOLN: 25.0000 ug | INTRAMUSCULAR | Status: DC | PRN

## 2019-04-08 MED ORDER — ACETAMINOPHEN 10 MG/ML IV SOLN: 1000.0000 mg | Freq: Once | INTRAVENOUS | Status: DC | PRN

## 2019-04-08 MED ORDER — LIDOCAINE 2% (20 MG/ML) 5 ML SYRINGE
INTRAMUSCULAR | Status: AC
  Filled 2019-04-08: qty 5

## 2019-04-08 MED ORDER — SUCCINYLCHOLINE CHLORIDE 200 MG/10ML IV SOSY
PREFILLED_SYRINGE | INTRAVENOUS | Status: AC
  Filled 2019-04-08: qty 10

## 2019-04-08 MED ORDER — DEXAMETHASONE SODIUM PHOSPHATE 10 MG/ML IJ SOLN
INTRAMUSCULAR | Status: DC | PRN
  Administered 2019-04-08: 10 mg via INTRAVENOUS

## 2019-04-08 MED ORDER — ROCURONIUM BROMIDE 50 MG/5ML IV SOSY
PREFILLED_SYRINGE | INTRAVENOUS | Status: DC | PRN
  Administered 2019-04-08: 40 mg via INTRAVENOUS

## 2019-04-08 MED ORDER — ONDANSETRON HCL 4 MG/2ML IJ SOLN
INTRAMUSCULAR | Status: AC
  Filled 2019-04-08: qty 2

## 2019-04-08 MED ORDER — DEXAMETHASONE SODIUM PHOSPHATE 10 MG/ML IJ SOLN
INTRAMUSCULAR | Status: AC
  Filled 2019-04-08: qty 1

## 2019-04-08 MED ORDER — PROPOFOL 10 MG/ML IV BOLUS
INTRAVENOUS | Status: AC
  Filled 2019-04-08: qty 40

## 2019-04-08 MED ORDER — METOPROLOL TARTRATE 5 MG/5ML IV SOLN
5.0000 mg | Freq: Once | INTRAVENOUS | Status: AC
  Administered 2019-04-08: 5 mg via INTRAVENOUS

## 2019-04-08 SURGICAL SUPPLY — 47 items
ADAPTER BRONCHOSCOPE OLYMPUS (ADAPTER) ×3 IMPLANT
ADAPTER VALVE BIOPSY EBUS (MISCELLANEOUS) IMPLANT
ADPTR VALVE BIOPSY EBUS (MISCELLANEOUS)
BRUSH CYTOL CELLEBRITY 1.5X140 (MISCELLANEOUS) IMPLANT
BRUSH SUPERTRAX BIOPSY (INSTRUMENTS) IMPLANT
BRUSH SUPERTRAX NDL-TIP CYTO (INSTRUMENTS) ×3 IMPLANT
CANISTER SUCT 3000ML PPV (MISCELLANEOUS) ×3 IMPLANT
CHANNEL WORK EXTEND EDGE 180 (KITS) IMPLANT
CHANNEL WORK EXTEND EDGE 90 (KITS) IMPLANT
CONT SPEC 4OZ CLIKSEAL STRL BL (MISCELLANEOUS) ×3 IMPLANT
COVER BACK TABLE 60X90IN (DRAPES) ×3 IMPLANT
COVER WAND RF STERILE (DRAPES) IMPLANT
DRAPE C-ARM 42X72 X-RAY (DRAPES) IMPLANT
FILTER STRAW FLUID ASPIR (MISCELLANEOUS) IMPLANT
FORCEPS BIOP RJ4 1.8 (CUTTING FORCEPS) IMPLANT
FORCEPS BIOP SUPERTRX PREMAR (INSTRUMENTS) ×3 IMPLANT
GAUZE SPONGE 4X4 12PLY STRL (GAUZE/BANDAGES/DRESSINGS) ×3 IMPLANT
GLOVE BIOGEL PI IND STRL 6.5 (GLOVE) ×1 IMPLANT
GLOVE BIOGEL PI INDICATOR 6.5 (GLOVE) ×2
GLOVE SURG SIGNA 7.5 PF LTX (GLOVE) ×3 IMPLANT
GLOVE SURG SS PI 6.5 STRL IVOR (GLOVE) ×3 IMPLANT
KIT CLEAN ENDO COMPLIANCE (KITS) ×6 IMPLANT
KIT LOCATABLE GUIDE (CANNULA) IMPLANT
KIT MARKER FIDUCIAL DELIVERY (KITS) IMPLANT
KIT PROCEDURE EDGE 180 (KITS) ×3 IMPLANT
KIT PROCEDURE EDGE 90 (KITS) IMPLANT
KIT TURNOVER KIT B (KITS) ×3 IMPLANT
MARKER SKIN DUAL TIP RULER LAB (MISCELLANEOUS) ×3 IMPLANT
NEEDLE ASPIRATION VIZISHOT 19G (NEEDLE) IMPLANT
NEEDLE ASPIRATION VIZISHOT 21G (NEEDLE) IMPLANT
NEEDLE SUPERTRX PREMARK BIOPSY (NEEDLE) IMPLANT
NS IRRIG 1000ML POUR BTL (IV SOLUTION) ×3 IMPLANT
OIL SILICONE PENTAX (PARTS (SERVICE/REPAIRS)) ×3 IMPLANT
PAD ARMBOARD 7.5X6 YLW CONV (MISCELLANEOUS) ×6 IMPLANT
PATCHES PATIENT (LABEL) ×3 IMPLANT
SYR 20ML ECCENTRIC (SYRINGE) ×6 IMPLANT
SYR 20ML LL LF (SYRINGE) ×6 IMPLANT
SYR 30ML LL (SYRINGE) ×3 IMPLANT
SYR 5ML LUER SLIP (SYRINGE) IMPLANT
TOWEL GREEN STERILE FF (TOWEL DISPOSABLE) ×3 IMPLANT
TRAP SPECIMEN MUCOUS 40CC (MISCELLANEOUS) IMPLANT
TUBE CONNECTING 20'X1/4 (TUBING) ×1
TUBE CONNECTING 20X1/4 (TUBING) ×2 IMPLANT
VALVE BIOPSY  SINGLE USE (MISCELLANEOUS) ×4
VALVE BIOPSY SINGLE USE (MISCELLANEOUS) ×2 IMPLANT
VALVE SUCTION BRONCHIO DISP (MISCELLANEOUS) ×3 IMPLANT
WATER STERILE IRR 1000ML POUR (IV SOLUTION) ×3 IMPLANT

## 2019-04-08 NOTE — H&P (Signed)
connected with Silverio Lay on 02/04/19 by a phone visit and verified that I am speaking with the correct person using two identifiers.  I discussed the limitations of evaluation and management by telemedicine. The patient expressed understanding and agreed to proceed.  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 He smoked 2 packs/day until age 65 when he quit, more than 60 pack years. 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  05/2018 bscopy >. BAL - pseudomonas, TBBXneg  We discussed results of CT chest 01/28/2019, I personally reviewed images the right upper lobe opacity has slightly increased in size from 4.8 x 1.3 cm to 5.1 x 4.7 cm.  It has a 3.8 cm solid component that is unchanged.  There was a new mild patchy tree-in-bud opacities in the left lower lobe  He was treated in the past with prednisone and this seems to have helped.  He again got a prescription for prednisone from his PCP.  He now denies shortness of breath or wheezing   Significant tests/ events reviewed  CT chest with contrast 05/20/2018 clarified this to be a5.3x 4.1cm masslike lesion with air bronchograms in the right upper lobe. There was a bandlike opacity extending towards the pleural from this mass was also patchy and bandlike opacities in the left lower lobe and medial right lower lobe. There is a 1 cm band was density in the medial right lung apex and a calcified granuloma in the liver lateral renal cysts.  CT chest 08/06/2018 -right upper lobe mass has decreased slightly from 5.3 x 4.7 x 4.1 cm to 4.9 x 4.5 x 3.4 cm, left lower lobe 2 cm density pleural-based seems to persist  PET 49/44/96 >>hypermetabolic RUL &LLL  CT chest 10/2018 left lower lobe opacity  has resolved, right upper lobe persists with airspace consolidation  PFTs 05/2018 ratio 54, FEV1 of 60%, FVC of 72% consistent with moderate airway obstruction, TLC 97%, DLCO 53%    Assessment & Plan Note by Rigoberto Noel, MD at 02/04/2019 11:51 AM Author: Rigoberto Noel, MD Author Type: Physician Filed: 02/04/2019 11:52 AM  Note Status: Written Cosign: Cosign Not Required Encounter Date: 02/04/2019  Problem: Mass of upper lobe of right lung  Editor: Rigoberto Noel, MD (Physician)    This seems to have slightly decreased on the prior CAT scan 10/2018 but now has increased in size although the central solid component is unchanged.  He is more receptive to the idea of a biopsy now while on his last visit he was not receptive at all.  I think it would be best to proceed with transthoracic CT-guided needle aspiration biopsy.  We discussed risks of the procedure including that of lung puncture requiring chest tube placement. He also has mild infiltrates left lower lobe which could indicate aspiration but this right upper lobe opacity has persisted now for quite some time    Patient Instructions by Rigoberto Noel, MD at 02/04/2019 11:30 AM Author: Rigoberto Noel, MD Author Type: Physician Filed: 02/04/2019 11:13 AM  Note Status: Signed Cosign: Cosign Not Required Encounter Date: 02/04/2019  Editor: Rigoberto Noel, MD (Physician)     Proceed with CT-guided needle biopsy of right upper lobe infiltrate We discussed risks and benefits     Discussed with Dr.  Yamagata from interventional radiology He feels that CT-guided biopsy is a very high risk of bleeding, also would need an anterior approach and have to traverse through a lot of lung so definite risk of pneumothorax. After repeat CT, ENB was deemed necessary   Christopher Rocha has presented today for procedure, with the diagnosis of non resolving RUL infiltrate. The various methods of treatment have been discussed with the patient and family. After  consideration of risks, benefits and other options for treatment, the patient has consented to Procedure(s) :  Bronchoscopy with navigation & EBUS with biopsy of lung & lymph node .  The patient's history has been reviewed, patient examined, no change in status, stable for surgery. I have reviewed the patient's chart and labs. Questions were answered to the patient's satisfaction     V. Elsworth Soho MD [phone]

## 2019-04-08 NOTE — Anesthesia Procedure Notes (Signed)
Procedure Name: Intubation Date/Time: 04/08/2019 8:15 AM Performed by: Genelle Bal, CRNA Pre-anesthesia Checklist: Patient identified, Emergency Drugs available, Suction available and Patient being monitored Patient Re-evaluated:Patient Re-evaluated prior to induction Oxygen Delivery Method: Circle system utilized Preoxygenation: Pre-oxygenation with 100% oxygen Induction Type: IV induction Ventilation: Mask ventilation without difficulty Laryngoscope Size: Miller and 2 Grade View: Grade I Tube type: Oral Tube size: 8.5 mm Number of attempts: 1 Airway Equipment and Method: Stylet and Oral airway Placement Confirmation: ETT inserted through vocal cords under direct vision,  positive ETCO2 and breath sounds checked- equal and bilateral Secured at: 23 cm Tube secured with: Tape Dental Injury: Teeth and Oropharynx as per pre-operative assessment

## 2019-04-08 NOTE — Transfer of Care (Signed)
Immediate Anesthesia Transfer of Care Note  Patient: Christopher Rocha  Procedure(s) Performed: VIDEO BRONCHOSCOPY WITH ENDOBRONCHIAL NAVIGATION (N/A )  Patient Location: PACU  Anesthesia Type:General  Level of Consciousness: awake, alert  and oriented  Airway & Oxygen Therapy: Patient Spontanous Breathing and Patient connected to face mask oxygen  Post-op Assessment: Report given to RN and Post -op Vital signs reviewed and stable  Post vital signs: Reviewed and stable  Last Vitals:  Vitals Value Taken Time  BP 164/69 04/08/19 0933  Temp    Pulse 52 04/08/19 0936  Resp 43 04/08/19 0936  SpO2 97 % 04/08/19 0936  Vitals shown include unvalidated device data.  Last Pain:  Vitals:   04/08/19 0630  PainSc: 0-No pain         Complications: No apparent anesthesia complications

## 2019-04-08 NOTE — Procedures (Signed)
Video Bronchoscopy with Electromagnetic Navigation Procedure Note  Date of Operation: 04/08/2019  Pre-op Diagnosis: persistent RUL infiltrate  Post-op Diagnosis: same  Surgeon: Leanna Sato. [place]    Anesthesia: General endotracheal anesthesia  Operation: Flexible video fiberoptic bronchoscopy with electromagnetic navigation and biopsies.  Estimated Blood Loss: Minimal  Complications: none  Indications and History: Christopher Rocha is a 78 y.o. male with persistent RUL infiltrate.  The risks, benefits, complications, treatment options and expected outcomes were discussed with the patient.  The possibilities of pneumothorax, pneumonia, reaction to medication, pulmonary aspiration, perforation of a viscus, bleeding, failure to diagnose a condition and creating a complication requiring transfusion or operation were discussed with the patient who freely signed the consent.    Description of Procedure: The patient was seen in the Preoperative Area, was examined and was deemed appropriate to proceed. BP was high on arrival & was treated by anesthesia The patient was taken to OR #10, identified as Christopher Rocha and the procedure verified as Flexible Video Fiberoptic Bronchoscopy.  A Time Out was held and the above information confirmed.   After the appropriate level of anesthesia was assured, flexible video bronchoscope was lubricated and inserted through the endotracheal tube.    Airway examination was performed bilaterally to subsegmental level.  Minimal clear secretions were noted, mucosa appeared normal and no endobronchial lesions were identified.  Endobronchial ultrasound video bronchoscope was then lubricated and inserted through the endotracheal tube. Surveillance of the mediastinal and and bilateral hilar lymph node stations was performed.  Pathologically enlarged lymph nodes were not noted.     Prior to the date of the procedure a high-resolution CT scan of the chest was  performed. Utilizing [employer] a virtual tracheobronchial tree was generated to allow the creation of distinct navigation pathways to the patient's parenchymal abnormalities. After being taken to the operating room general anesthesia was initiated and the patient  was orally intubated. The video fiberoptic bronchoscope was introduced via the endotracheal tube and a general inspection was performed which showed inspissated mucus in the LLL which was suctioned out. The extendable working channel and locator guide were introduced into the bronchoscope. The distinct navigation pathways prepared prior to this procedure were then utilized to navigate to within  0.9 cm of patient's lesion(s) identified on CT scan. The extendable working channel was secured into place and the locator guide was withdrawn. Under fluoroscopic guidance transbronchial needle brushings, transbronchial Wang needle biopsies, and transbronchial forceps biopsies were performed to be sent for cytology and pathology. A bronchioalveolar lavage was performed in the RUL and sent for cytology and microbiology (bacterial, fungal, AFB smears and cultures). At the end of the procedure a general airway inspection was performed and there was no evidence of active bleeding. The bronchoscope was removed.  The patient tolerated the procedure well. There was no significant blood loss and there were no obvious complications. A post-procedural chest x-ray is pending.  Samples: 1. Transbronchial needle brushings from RUL 2. Transbronchial forceps biopsies from RUL 3. Bronchoalveolar lavage from RUL   Plans:  The patient will be discharged from the PACU to home when recovered from anesthesia and after chest x-ray is reviewed. We will review the cytology, pathology and microbiology results with the patient when they become available. Outpatient followup will be with me in 7 days.    Leanna Sato Elsworth Soho MD 04/08/2019

## 2019-04-09 ENCOUNTER — Encounter (HOSPITAL_COMMUNITY): Payer: Self-pay | Admitting: Pulmonary Disease

## 2019-04-09 LAB — ACID FAST SMEAR (AFB, MYCOBACTERIA): Acid Fast Smear: NEGATIVE

## 2019-04-11 ENCOUNTER — Other Ambulatory Visit: Payer: Self-pay | Admitting: *Deleted

## 2019-04-11 DIAGNOSIS — R911 Solitary pulmonary nodule: Secondary | ICD-10-CM

## 2019-04-15 ENCOUNTER — Other Ambulatory Visit: Payer: Self-pay

## 2019-04-15 NOTE — Patient Outreach (Signed)
[facility]) Care Management  04/15/2019  Christopher Rocha 1941/07/16 [id number]   Medication Adherence call to Christopher Rocha [facility] Identifiers Verify spoke with patient he is past due on Losartan/Hctz 100/12.5 mg patient explain he is taking 1 tablet daily patient said he has a few and ask if we can call [facility] an order [facility] to [facility]. Christopher Rocha is showing past due under [facility].  [facility] Direct Dial [phone]  Fax [phone] [email or web address]

## 2019-04-16 NOTE — Anesthesia Postprocedure Evaluation (Signed)
Anesthesia Post Note  Patient: Christopher Rocha  Procedure(s) Performed: VIDEO BRONCHOSCOPY WITH ENDOBRONCHIAL NAVIGATION (N/A ) Video Bronchoscopy With Endobronchial Ultrasound (N/A )     Patient location during evaluation: PACU Anesthesia Type: General Level of consciousness: awake and alert Pain management: pain level controlled Vital Signs Assessment: post-procedure vital signs reviewed and stable Respiratory status: spontaneous breathing, nonlabored ventilation, respiratory function stable and patient connected to nasal cannula oxygen Cardiovascular status: blood pressure returned to baseline and stable Postop Assessment: no apparent nausea or vomiting Anesthetic complications: no    Last Vitals:  Vitals:   04/08/19 0959 04/08/19 1000  BP: (!) 149/90   Pulse:  (!) 46  Resp:  (!) 21  Temp:  (!) 36.1 C  SpO2:  93%    Last Pain:  Vitals:   04/08/19 1000  PainSc: 0-No pain                 Margareta Laureano

## 2019-04-17 ENCOUNTER — Other Ambulatory Visit: Payer: Self-pay | Admitting: *Deleted

## 2019-04-17 NOTE — Progress Notes (Signed)
The proposed treatment discussed in cancer conference 04/17/2019 is for discussion purpose only and is not a binding recommendation.  The patient was not physically examined nor present for their treatment options.  Therefore, final treatment plans cannot be decided.   Dr. Elsworth Soho updated on recommendations

## 2019-04-18 DIAGNOSIS — E538 Deficiency of other specified B group vitamins: Secondary | ICD-10-CM | POA: Diagnosis not present

## 2019-04-18 NOTE — Telephone Encounter (Signed)
Called and spoke with pt letting him know that after RA and conference reviewed CT, it was determined that he should have a referral to see thoracic surgeon if he was willing to proceed with the referral. Pt verbalized understanding and said he would be okay with the referral. Order placed for the referral. Routing to [facility] as an [place] so she knows this has been done.

## 2019-04-18 NOTE — Telephone Encounter (Signed)
Can you please let him know that we discussed his CT scan findings and multidisciplinary conference and it was felt he should be referred to thoracic surgery for second opinion. Okay to proceed with referral if he is willing

## 2019-04-29 DIAGNOSIS — D509 Iron deficiency anemia, unspecified: Secondary | ICD-10-CM | POA: Diagnosis not present

## 2019-04-30 ENCOUNTER — Institutional Professional Consult (permissible substitution) (INDEPENDENT_AMBULATORY_CARE_PROVIDER_SITE_OTHER): Payer: Medicare Other | Admitting: Surgery

## 2019-04-30 ENCOUNTER — Encounter: Payer: Self-pay | Admitting: Surgery

## 2019-04-30 ENCOUNTER — Other Ambulatory Visit: Payer: Self-pay

## 2019-04-30 VITALS — BP 200/77 | HR 58 | Temp 97.7°F | Resp 16 | Ht 71.0 in | Wt 204.0 lb

## 2019-04-30 DIAGNOSIS — Z952 Presence of prosthetic heart valve: Secondary | ICD-10-CM | POA: Diagnosis not present

## 2019-04-30 DIAGNOSIS — D381 Neoplasm of uncertain behavior of trachea, bronchus and lung: Secondary | ICD-10-CM | POA: Diagnosis not present

## 2019-05-01 ENCOUNTER — Encounter: Payer: Self-pay | Admitting: Surgery

## 2019-05-01 ENCOUNTER — Encounter: Payer: Medicare Other | Admitting: Surgery

## 2019-05-01 ENCOUNTER — Other Ambulatory Visit: Payer: Self-pay | Admitting: Surgery

## 2019-05-01 DIAGNOSIS — D381 Neoplasm of uncertain behavior of trachea, bronchus and lung: Secondary | ICD-10-CM

## 2019-05-01 DIAGNOSIS — R911 Solitary pulmonary nodule: Secondary | ICD-10-CM

## 2019-05-01 NOTE — Progress Notes (Signed)
Cardiothoracic Surgery Consultation  PCP is [name], MD Referring Provider is Rigoberto Noel, MD  Chief Complaint  Patient presents with   Lung Lesion    RULobe.Marland KitchenMarland KitchenSurgical eval, Chest CT 03/20/19, Bronchocopy 04/08/19    HPI:  The patient is a 78 year old gentleman with a history of hypertension, hyperlipidemia, coronary artery disease status post coronary bypass graft surgery x2 at [facility] in 2014, and severe aortic stenosis status post transapical TAVR with a 29 mm Edwards Sapien XT valve on 07/02/2018.  His preoperative CT scan of the chest on 05/30/2018 showed a 4.1 x 4.8 x 6.0 cm mass in the right upper lobe with some internal air bronchograms and spiculated margins suspicious for a primary bronchogenic carcinoma.  A PET scan on 06/13/2018 showed hypermetabolic activity within the lesion with an SUV max of 5.4.  There was also a small focus of consolidation/atelectasis in the inferior left lower lobe which had hypermetabolic activity with an SUV max of 5.4 and was felt to be indeterminate.  He underwent flexible bronchoscopy with biopsies and brushings of the right upper lobe by Dr. Elsworth Soho and all specimens were negative for malignancy.  The cultures did grow Pseudomonas and he was treated with a one-week course of Levaquin in case this was pneumonia.  Dr. Elsworth Soho planned a follow-up CT scan in about 3 months and therefore we decided to proceed with TAVR.  He had severe calcific atherosclerosis of the iliac and femoral vessels and therefore we had to do transapical insertion.  He had an uncomplicated postoperative course and his symptoms of exertional fatigue and shortness of breath resolved.  He had a follow-up CT scan of the chest on 01/30/2019 which was felt to show a possible slight increase in the size of the right upper lobe lung mass.  He therefore had a super dimension CT of the chest on 03/20/2019.  This showed no significant change in the overall size of the right upper lobe  lesion although the solid component was felt to be slightly larger.  On 04/08/2019 he underwent flexible bronchoscopy and ENB with biopsies of the right upper lobe lesion by Dr. Elsworth Soho.  These showed no evidence of malignancy.  1 of the cytology specimens from a transbronchial biopsy showed rare atypical cells.  He says that he feels well overall and has been very active.  His only complaint is of some mucus production at times as well as wheezing and some coughing.  He denies any hemoptysis. Past Medical History:  Diagnosis Date   Anemia    iron deficiency    Coronary artery disease    HLD (hyperlipidemia)    HTN (hypertension)    Pulmonary nodule    S/P CABG (coronary artery bypass graft)    S/P TAVR (transcatheter aortic valve replacement)    Edwards Sapien XT THV (size 29 mm, model # 9300TFX, serial # [id number])   Severe aortic stenosis     Past Surgical History:  Procedure Laterality Date   BONE MARROW ASPIRATION     CARDIAC CATHETERIZATION     CARDIAC SURGERY     COLONOSCOPY     CORONARY ARTERY BYPASS GRAFT     EYE SURGERY     bilateral cataracts   FIBEROPTIC BRONCHOSCOPY  04/08/2019       RIGHT/LEFT HEART CATH AND CORONARY/GRAFT ANGIOGRAPHY N/A 05/08/2018   Procedure: RIGHT/LEFT HEART CATH AND CORONARY/GRAFT ANGIOGRAPHY;  Surgeon: Wellington Hampshire, MD;  Location: [place] [facility];  Service: Cardiovascular;  Laterality: N/A;   TEE WITHOUT CARDIOVERSION  07/02/2018   Procedure: TRANSESOPHAGEAL ECHOCARDIOGRAM (TEE);  Surgeon: Sherren Mocha, MD;  Location: [place];  Service: Open Heart Surgery;;   TRANSCATHETER AORTIC VALVE REPLACEMENT, TRANSAPICAL N/A 07/02/2018   Procedure: TRANSCATHETER AORTIC VALVE REPLACEMENT, TRANSAPICAL;  Surgeon: Sherren Mocha, MD;  Location: [place];  Service: Open Heart Surgery;  Laterality: N/A;   VIDEO BRONCHOSCOPY Bilateral 05/31/2018   Procedure: VIDEO BRONCHOSCOPY WITH FLUORO;  Surgeon: Rigoberto Noel, MD;  Location: [facility];   Service: Cardiopulmonary;  Laterality: Bilateral;   VIDEO BRONCHOSCOPY WITH ENDOBRONCHIAL NAVIGATION N/A 04/08/2019   Procedure: VIDEO BRONCHOSCOPY WITH ENDOBRONCHIAL NAVIGATION;  Surgeon: Rigoberto Noel, MD;  Location: [facility];  Service: Thoracic;  Laterality: N/A;   VIDEO BRONCHOSCOPY WITH ENDOBRONCHIAL ULTRASOUND N/A 04/08/2019   Procedure: Video Bronchoscopy With Endobronchial Ultrasound;  Surgeon: Rigoberto Noel, MD;  Location: [facility];  Service: Thoracic;  Laterality: N/A;   WISDOM TOOTH EXTRACTION      Family History  Problem Relation Age of Onset   Diabetes Mother    Clotting disorder Father     Social History Social History   Tobacco Use   Smoking status: Former Smoker   Smokeless tobacco: Never Used  Substance Use Topics   Alcohol use: Not Currently    Frequency: Never   Drug use: Never    Current Outpatient Medications  Medication Sig Dispense Refill   aspirin EC 81 MG tablet Take 81 mg by mouth every other day.      budesonide-formoterol (SYMBICORT) 160-4.5 MCG/ACT inhaler Inhale 2 puffs into the lungs 2 (two) times daily. 1 Inhaler 5   cholecalciferol (VITAMIN D3) 25 MCG (1000 UT) tablet Take 1,000 Units by mouth daily.     guaiFENesin (MUCINEX) 600 MG 12 hr tablet Take 1 tablet (600 mg total) by mouth 2 (two) times daily as needed for cough or to loosen phlegm. (Patient taking differently: Take 600 mg by mouth 2 (two) times a day. )     ibuprofen (ADVIL,MOTRIN) 200 MG tablet Take 200 mg by mouth 2 (two) times a day.      losartan-hydrochlorothiazide (HYZAAR) 100-12.5 MG tablet Take 1 tablet by mouth daily.     metoprolol tartrate (LOPRESSOR) 25 MG tablet Take 1 tablet (25 mg total) by mouth 2 (two) times daily. 60 tablet 1   Naphazoline-Glycerin (REDNESS RELIEF OP) Place 1 drop into both eyes daily as needed (for redness/itching).     PROAIR HFA 108 (90 Base) MCG/ACT inhaler Inhale 2 puffs into the lungs every 4 (four) hours as needed for shortness of  breath.     rosuvastatin (CRESTOR) 5 MG tablet Take 5 mg by mouth every other day.     No current facility-administered medications for this visit.     Allergies  Allergen Reactions   Penicillins Hives and Rash    Did it involve swelling of the face/tongue/throat, SOB, or low BP? No Did it involve sudden or severe rash/hives, skin peeling, or any reaction on the inside of your mouth or nose? No Did you need to seek medical attention at a hospital or doctor's office? No When did it last happen?10-15 years If all above answers are "NO", may proceed with cephalosporin use.      Review of Systems  Constitutional: Negative for activity change, appetite change, chills, fatigue, fever and unexpected weight change.  HENT: Negative.   Eyes: Negative.   Respiratory: Positive for cough and wheezing.   Cardiovascular: Negative for chest pain.  Gastrointestinal: Negative.   Endocrine: Negative.   Genitourinary: Negative.   Musculoskeletal:       Leg cramps with exertion  Skin: Negative.   Allergic/Immunologic: Negative.   Neurological: Negative.   Hematological: Negative.   Psychiatric/Behavioral: Negative.     BP (!) 200/77 (BP Location: Right Arm, Patient Position: Sitting, Cuff Size: Large)    Pulse (!) 58    Temp 97.7 F (36.5 C)    Resp 16    Ht _0  (1.803 m)    Wt 204 lb (92.5 kg)    SpO2 95% Comment: RA   BMI 28.45 kg/m  Physical Exam Constitutional:      Appearance: Normal appearance. He is normal weight.  HENT:     Head: Normocephalic and atraumatic.  Eyes:     Extraocular Movements: Extraocular movements intact.     Conjunctiva/sclera: Conjunctivae normal.     Pupils: Pupils are equal, round, and reactive to light.  Neck:     Musculoskeletal: Normal range of motion and neck supple.     Vascular: No carotid bruit.  Cardiovascular:     Rate and Rhythm: Normal rate and regular rhythm.     Heart sounds: Normal heart sounds. No murmur.  Pulmonary:     Effort:  Pulmonary effort is normal.     Breath sounds: Normal breath sounds. No wheezing or rhonchi.  Abdominal:     General: Abdomen is flat.     Palpations: Abdomen is soft.     Tenderness: There is no abdominal tenderness.  Musculoskeletal: Normal range of motion.        General: No swelling.  Lymphadenopathy:     Cervical: No cervical adenopathy.  Skin:    General: Skin is warm and dry.  Neurological:     General: No focal deficit present.     Mental Status: He is alert and oriented to person, place, and time.  Psychiatric:        Mood and Affect: Mood normal.        Behavior: Behavior normal.      Diagnostic Tests:  CLINICAL DATA:  Lung mass  EXAM: CT CHEST WITH CONTRAST  TECHNIQUE: Multidetector CT imaging of the chest was performed using thin slice collimation for electromagnetic bronchoscopy planning purposes, with intravenous contrast.  CONTRAST:  102m OMNIPAQUE IOHEXOL 300 MG/ML  SOLN  COMPARISON:  ROval LinseyCT chest dated 01/30/2019  FINDINGS: Cardiovascular: The heart is top-normal in size. No pericardial effusion.  Prosthetic aortic valve. No evidence of thoracic aortic aneurysm. Atherosclerotic calcifications of the aortic arch.  Three vessel coronary atherosclerosis.  Mediastinum/Nodes: 9 mm short axis low right paratracheal node (series 2/image 87), previously 8 mm.  Small left thyroid nodules measuring up to 14 mm posteriorly.  Lungs/Pleura: 5.0 x 5.0 x 4.9 cm predominantly solid mass in the right upper lobe (series 3/image 43), previously 5.1 x 4.7 x 4.9 cm, overall grossly unchanged. However, the solid component now measures 4.5 cm in maximal axial dimension (series 3/image 44), previously 3.8 cm. Associated pleural retraction/involvement of the apicolateral chest wall (series 3/image 32).  Mild centrilobular and paraseptal imminent changes.  No focal consolidation.  Lingular scarring/atelectasis.  No pleural effusion or  pneumothorax.  Upper Abdomen: Visualized upper abdomen is notable for a small hiatal hernia and bilateral renal cysts.  Musculoskeletal: Mild degenerative changes of the visualized thoracolumbar spine. Median sternotomy.  IMPRESSION: 5.0 cm predominantly solid right upper lobe mass with progressive 4.5 cm solid component, as above.  9 mm short  axis low right paratracheal node, indeterminate.  Aortic Atherosclerosis (ICD10-I70.0) and Emphysema (ICD10-J43.9).   Electronically Signed   By: Julian Hy M.D.   On: 03/21/2019 08:40   Pulmonary function test Order: [id number] Status:  Edited Result - FINAL Visible to patient:  No (not released) Next appt:  05/05/2019 at 09:00 AM in Cardiology Jenean Lindau, MD) Dx:  Severe aortic stenosis  Ref Range & Units 69moago  FVC-%Pred-Pre % 66   FVC-Post L 3.11   FVC-%Pred-Post % 72   FVC-%Change-Post % 9   FEV1-Pre L 1.55   FEV1-%Pred-Pre % 49   FEV1-Post L 1.86   FEV1-%Pred-Post % 60   FEV1-%Change-Post % 20   FEV6-Pre L 2.60   FEV6-%Pred-Pre % 64   FEV6-Post L 2.92   FEV6-%Pred-Post % 72   FEV6-%Change-Post % 12   Pre FEV1/FVC ratio % 54   FEV1FVC-%Pred-Pre % 75   Post FEV1/FVC ratio % 60   FEV1FVC-%Change-Post % 9   Pre FEV6/FVC Ratio % 91   FEV6FVC-%Pred-Pre % 97   Post FEV6/FVC ratio % 94   FEV6FVC-%Pred-Post % 99   FEV6FVC-%Change-Post % 2   FEF 25-75 Pre L/sec 0.61   FEF2575-%Pred-Pre % 27   FEF 25-75 Post L/sec 1.08   FEF2575-%Pred-Post % 48   FEF2575-%Change-Post % 75   RV L 4.34   RV % pred % 163   TLC L 7.10   TLC % pred % 97   DLCO unc ml/min/mmHg 17.88   DLCO unc % pred % 53   DL/VA ml/min/mmHg/L 3.62   DL/VA % pred % 77   Resulting Agency  [facility]      Specimen Collected: 05/30/18 12:04 Last Resulted: 05/30/18 12:05        Impression:  This 78year old gentleman has a 5 cm hypermetabolic right upper lobe lung mass that was initially found on CT scan of the chest done in work-up  for TAVR in September 2019.  Initial flexible bronchoscopy with biopsies and brushings showed no evidence of malignancy.  He underwent successful TAVR and the lesion has been followed by Dr. AElsworth Soho  He had follow-up CT scans of the chest in May and July 2020.  I have personally reviewed all of his CT scans I do not think this lesion has changed much if at all from his initial scan last September.  He recently underwent ENB with biopsies which were negative for malignancy.  It is certainly possible that this could be a slow-growing adenocarcinoma although given its size I would still expect significant change over the past year.  I do not think he would be a candidate for a right upper lobectomy given his age of 89years, severe obstructive airways disease and moderate reduction in his diffusion capacity noted on his PFTs last September.  If this is a bronchogenic carcinoma then I think radiation therapy would be the best option for him.  I would recommend repeating his PET scan now to rule out any evidence of regional or distant metastasis and then proceeding with a CT-guided needle biopsy.  If this is still negative then a decision would have to be made whether to treat him empirically with radiation therapy or continue following this lesion.  I reviewed all the CT scan images with the patient and his wife and daughter and answered all of their questions.  He is going to be discussed at the multidisciplinary thoracic oncology conference tomorrow.  Plan:  The patient will be discussed at the multidisciplinary  thoracic oncology conference and then further plans for work-up and treatment can be coordinated by Dr. Elsworth Soho.  I spent 45 minutes performing this consultation and > 50% of this time was spent face to face counseling and coordinating the care of this patient's right upper lobe lung mass.   [name], MD [facility] [phone]

## 2019-05-02 ENCOUNTER — Encounter: Payer: Self-pay | Admitting: *Deleted

## 2019-05-02 ENCOUNTER — Ambulatory Visit: Payer: Medicare Other | Admitting: Cardiology

## 2019-05-02 DIAGNOSIS — R918 Other nonspecific abnormal finding of lung field: Secondary | ICD-10-CM

## 2019-05-02 NOTE — Progress Notes (Signed)
Oncology Nurse Navigator Documentation  Oncology Nurse Navigator Flowsheets 05/02/2019  Navigator Location [facility]  Referral Date to RadOnc/MedOnc 05/02/2019  Navigator Encounter Type Other/I received referral for patient to be seen with Rad Onc.  I completed referral and will update their scheduling team.   Barriers/Navigation Needs Coordination of Care  Interventions Coordination of Care  Coordination of Care Other  Acuity Level 1  Time Spent with Patient 30

## 2019-05-05 ENCOUNTER — Ambulatory Visit (INDEPENDENT_AMBULATORY_CARE_PROVIDER_SITE_OTHER): Payer: Medicare Other | Admitting: Cardiology

## 2019-05-05 ENCOUNTER — Other Ambulatory Visit: Payer: Self-pay

## 2019-05-05 ENCOUNTER — Encounter: Payer: Self-pay | Admitting: Cardiology

## 2019-05-05 VITALS — BP 218/90 | HR 44 | Ht 71.0 in | Wt 204.0 lb

## 2019-05-05 DIAGNOSIS — J431 Panlobular emphysema: Secondary | ICD-10-CM | POA: Diagnosis not present

## 2019-05-05 DIAGNOSIS — R918 Other nonspecific abnormal finding of lung field: Secondary | ICD-10-CM

## 2019-05-05 DIAGNOSIS — E782 Mixed hyperlipidemia: Secondary | ICD-10-CM

## 2019-05-05 DIAGNOSIS — Z952 Presence of prosthetic heart valve: Secondary | ICD-10-CM

## 2019-05-05 DIAGNOSIS — Z951 Presence of aortocoronary bypass graft: Secondary | ICD-10-CM | POA: Diagnosis not present

## 2019-05-05 DIAGNOSIS — I10 Essential (primary) hypertension: Secondary | ICD-10-CM

## 2019-05-05 DIAGNOSIS — I2510 Atherosclerotic heart disease of native coronary artery without angina pectoris: Secondary | ICD-10-CM

## 2019-05-05 LAB — LIPID PANEL
Chol/HDL Ratio: 4 ratio (ref 0.0–5.0)
Cholesterol, Total: 216 mg/dL — ABNORMAL HIGH (ref 100–199)
HDL: 54 mg/dL (ref >39)
LDL Chol Calc (NIH): 144 mg/dL — ABNORMAL HIGH (ref 0–99)
Triglycerides: 99 mg/dL (ref 0–149)
VLDL Cholesterol Cal: 18 mg/dL (ref 5–40)

## 2019-05-05 NOTE — Progress Notes (Signed)
Cardiology Office Note:    Date:  05/05/2019   ID:  Christopher Rocha, DOB [birth year], MRN [id number]  PCP:  [name], MD  Cardiologist:  Jenean Lindau, MD   Referring MD: [name], MD    ASSESSMENT:    1. S/P TAVR (transcatheter aortic valve replacement)   2. Mixed dyslipidemia   3. Mass of upper lobe of right lung   4. Hx of CABG   5. Panlobular emphysema ([place])   6. Coronary artery disease involving native coronary artery of native heart without angina pectoris   7. Essential hypertension    PLAN:    In order of problems listed above:  1. Coronary artery disease: Secondary prevention stressed with the patient.  Importance of compliance with diet and medication stressed and he vocalized understanding.  His blood pressure is stable. 2. Mixed dyslipidemia: Patient will have blood work done today.  Diet was discussed 3. Essential hypertension: He has an element of whitecoat hypertension.  He has had issues with hypotension in the past.  In view of this have asked him to keep his blood pressure track 3 times a day.  He will check it only after he takes his medications and send it to me by mail in a week 4. Importance of regular exercise stressed.  I told him to walk on a regular basis.  His pulmonary mass issues are followed by his primary care physician and his pulmonologist. 5. Patient will be seen in follow-up appointment in 3 months or earlier if the patient has any concerns    Medication Adjustments/Labs and Tests Ordered: Current medicines are reviewed at length with the patient today.  Concerns regarding medicines are outlined above.  No orders of the defined types were placed in this encounter.  No orders of the defined types were placed in this encounter.    Chief Complaint  Patient presents with  . Follow-up     History of Present Illness:    Christopher Rocha is a 78 y.o. male.  Patient has past medical history of essential hypertension,  coronary artery disease post CABG surgery, dyslipidemia and TAVR surgery.  The patient mentions to me that he is being evaluated for mass in the lung and has had bronchoscopy.  His pulmonologist and cardiothoracic surgeon are evaluating for the possibility of cancer.  At the time of my evaluation, the patient is alert awake oriented and in no distress.  Past Medical History:  Diagnosis Date  . Anemia    iron deficiency   . Coronary artery disease   . HLD (hyperlipidemia)   . HTN (hypertension)   . Pulmonary nodule   . S/P CABG (coronary artery bypass graft)   . S/P TAVR (transcatheter aortic valve replacement)    Edwards Sapien XT THV (size 29 mm, model # 9300TFX, serial # [id number])  . Severe aortic stenosis     Past Surgical History:  Procedure Laterality Date  . BONE MARROW ASPIRATION    . CARDIAC CATHETERIZATION    . CARDIAC SURGERY    . COLONOSCOPY    . CORONARY ARTERY BYPASS GRAFT    . EYE SURGERY     bilateral cataracts  . FIBEROPTIC BRONCHOSCOPY  04/08/2019      . RIGHT/LEFT HEART CATH AND CORONARY/GRAFT ANGIOGRAPHY N/A 05/08/2018   Procedure: RIGHT/LEFT HEART CATH AND CORONARY/GRAFT ANGIOGRAPHY;  Surgeon: Wellington Hampshire, MD;  Location: [place] [facility];  Service: Cardiovascular;  Laterality: N/A;  . TEE WITHOUT CARDIOVERSION  07/02/2018   Procedure: TRANSESOPHAGEAL ECHOCARDIOGRAM (TEE);  Surgeon: Sherren Mocha, MD;  Location: [place];  Service: Open Heart Surgery;;  . TRANSCATHETER AORTIC VALVE REPLACEMENT, TRANSAPICAL N/A 07/02/2018   Procedure: TRANSCATHETER AORTIC VALVE REPLACEMENT, TRANSAPICAL;  Surgeon: Sherren Mocha, MD;  Location: [place];  Service: Open Heart Surgery;  Laterality: N/A;  . VIDEO BRONCHOSCOPY Bilateral 05/31/2018   Procedure: VIDEO BRONCHOSCOPY WITH FLUORO;  Surgeon: Rigoberto Noel, MD;  [facility];  Service: Cardiopulmonary;  Laterality: Bilateral;  . VIDEO BRONCHOSCOPY WITH ENDOBRONCHIAL NAVIGATION N/A 04/08/2019   Procedure: VIDEO  BRONCHOSCOPY WITH ENDOBRONCHIAL NAVIGATION;  Surgeon: Rigoberto Noel, MD;  Location: [place];  Service: Thoracic;  Laterality: N/A;  . VIDEO BRONCHOSCOPY WITH ENDOBRONCHIAL ULTRASOUND N/A 04/08/2019   Procedure: Video Bronchoscopy With Endobronchial Ultrasound;  Surgeon: Rigoberto Noel, MD;  [facility];  Service: Thoracic;  Laterality: N/A;  . WISDOM TOOTH EXTRACTION      Current Medications: Current Meds  Medication Sig  . aspirin EC 81 MG tablet Take 81 mg by mouth every other day.   . budesonide-formoterol (SYMBICORT) 160-4.5 MCG/ACT inhaler Inhale 2 puffs into the lungs 2 (two) times daily.  . cholecalciferol (VITAMIN D3) 25 MCG (1000 UT) tablet Take 1,000 Units by mouth daily.  Marland Kitchen guaiFENesin (MUCINEX) 600 MG 12 hr tablet Take 1 tablet (600 mg total) by mouth 2 (two) times daily as needed for cough or to loosen phlegm. (Patient taking differently: Take 600 mg by mouth 2 (two) times a day. )  . ibuprofen (ADVIL,MOTRIN) 200 MG tablet Take 200 mg by mouth 2 (two) times a day.   . losartan-hydrochlorothiazide (HYZAAR) 100-12.5 MG tablet Take 1 tablet by mouth daily.  . metoprolol tartrate (LOPRESSOR) 25 MG tablet Take 1 tablet (25 mg total) by mouth 2 (two) times daily.  . Naphazoline-Glycerin (REDNESS RELIEF OP) Place 1 drop into both eyes daily as needed (for redness/itching).  Marland Kitchen PROAIR HFA 108 (90 Base) MCG/ACT inhaler Inhale 2 puffs into the lungs every 4 (four) hours as needed for shortness of breath.  . rosuvastatin (CRESTOR) 5 MG tablet Take 5 mg by mouth every other day.     Allergies:   Penicillins   Social History   Socioeconomic History  . Marital status: Married    Spouse name: Not on file  . Number of children: Not on file  . Years of education: Not on file  . Highest education level: Not on file  Occupational History  . Not on file  Social Needs  . Financial resource strain: Not on file  . Food insecurity    Worry: Not on file    Inability: Not on file  .  Transportation needs    Medical: Not on file    Non-medical: Not on file  Tobacco Use  . Smoking status: Former Research scientist (life sciences)  . Smokeless tobacco: Never Used  Substance and Sexual Activity  . Alcohol use: Not Currently    Frequency: Never  . Drug use: Never  . Sexual activity: Not on file  Lifestyle  . Physical activity    Days per week: Not on file    Minutes per session: Not on file  . Stress: Not on file  Relationships  . Social Herbalist on phone: Not on file    Gets together: Not on file    Attends religious service: Not on file    Active member of club or organization: Not on file    Attends meetings of  clubs or organizations: Not on file    Relationship status: Not on file  Other Topics Concern  . Not on file  Social History Narrative  . Not on file     Family History: The patient's family history includes Clotting disorder in his father; Diabetes in his mother.  ROS:   Please see the history of present illness.    All other systems reviewed and are negative.  EKGs/Labs/Other Studies Reviewed:    The following studies were reviewed today: As mentioned above.  I reviewed pulmonologist and cardiac surgeons notes.   Recent Labs: 06/28/2018: ALT 21; B Natriuretic Peptide 175.5 07/04/2018: Magnesium 2.2 04/02/2019: BUN 14; Creatinine, Ser 1.13; Hemoglobin 12.6; Platelets 204; Potassium 4.5; Sodium 138  Recent Lipid Panel No results found for: CHOL, TRIG, HDL, CHOLHDL, VLDL, LDLCALC, LDLDIRECT  Physical Exam:    VS:  BP (!) 218/90 (BP Location: Left Arm, Patient Position: Sitting, Cuff Size: Normal)   Pulse (!) 44   Ht 5\' 11"  (1.803 m)   Wt 204 lb (92.5 kg)   SpO2 97%   BMI 28.45 kg/m     Wt Readings from Last 3 Encounters:  05/05/19 204 lb (92.5 kg)  04/30/19 204 lb (92.5 kg)  04/08/19 202 lb 5 oz (91.8 kg)     GEN: Patient is in no acute distress HEENT: Normal NECK: No JVD; No carotid bruits LYMPHATICS: No lymphadenopathy CARDIAC: Hear  sounds regular, 2/6 systolic murmur at the apex. RESPIRATORY:  Clear to auscultation without rales, wheezing or rhonchi  ABDOMEN: Soft, non-tender, non-distended MUSCULOSKELETAL:  No edema; No deformity  SKIN: Warm and dry NEUROLOGIC:  Alert and oriented x 3 PSYCHIATRIC:  Normal affect   Signed, Jenean Lindau, MD  05/05/2019 9:08 AM    [place]

## 2019-05-05 NOTE — Patient Instructions (Signed)
Medication Instructions:  Your physician recommends that you continue on your current medications as directed. Please refer to the Current Medication list given to you today.  If you need a refill on your cardiac medications before your next appointment, please call your pharmacy.   Lab work: Your physician recommends that you have a lipid panel drawn today. If you have labs (blood work) drawn today and your tests are completely normal, you will receive your results only by: Marland Kitchen MyChart Message (if you have MyChart) OR . A paper copy in the mail If you have any lab test that is abnormal or we need to change your treatment, we will call you to review the results.  Testing/Procedures: NONE  Follow-Up: At [facility], you and your health needs are our priority.  As part of our continuing mission to provide you with exceptional heart care, we have created designated Provider Care Teams.  These Care Teams include your primary Cardiologist (physician) and Advanced Practice Providers (APPs -  Physician Assistants and Nurse Practitioners) who all work together to provide you with the care you need, when you need it. You will need a follow up appointment in 4 months.

## 2019-05-05 NOTE — Addendum Note (Signed)
Addended by: Beckey Rutter on: 05/05/2019 09:34 AM   Modules accepted: Orders

## 2019-05-07 ENCOUNTER — Telehealth: Payer: Self-pay | Admitting: *Deleted

## 2019-05-07 DIAGNOSIS — E782 Mixed hyperlipidemia: Secondary | ICD-10-CM

## 2019-05-07 LAB — FUNGUS CULTURE RESULT

## 2019-05-07 LAB — FUNGUS CULTURE WITH STAIN

## 2019-05-07 LAB — FUNGAL ORGANISM REFLEX

## 2019-05-07 MED ORDER — ROSUVASTATIN CALCIUM 10 MG PO TABS: 10.0000 mg | ORAL_TABLET | Freq: Every day | ORAL | 1 refills | Status: DC

## 2019-05-07 NOTE — Telephone Encounter (Signed)
Telephone call to patient. Left message per DPR to increase crestor to 10 mg daily ,watch diet and recheck lipid and liver tests in 6 weeks.

## 2019-05-07 NOTE — Telephone Encounter (Signed)
-----   Message from Jenean Lindau, MD sent at 05/06/2019  8:08 AM EDT ----- Double rosuvastatin and diet and liver lipid check in 6 weeks. Jenean Lindau, MD 05/06/2019 8:08 AM

## 2019-05-08 ENCOUNTER — Ambulatory Visit (HOSPITAL_COMMUNITY)
Admission: RE | Admit: 2019-05-08 | Discharge: 2019-05-08 | Disposition: A | Discharge status: Home / Self Care | Payer: Medicare Other | Source: Ambulatory Visit | Attending: Surgery | Admitting: Surgery

## 2019-05-08 ENCOUNTER — Other Ambulatory Visit: Payer: Self-pay

## 2019-05-08 DIAGNOSIS — D381 Neoplasm of uncertain behavior of trachea, bronchus and lung: Secondary | ICD-10-CM | POA: Diagnosis not present

## 2019-05-08 DIAGNOSIS — R918 Other nonspecific abnormal finding of lung field: Secondary | ICD-10-CM | POA: Diagnosis not present

## 2019-05-08 DIAGNOSIS — Z79899 Other long term (current) drug therapy: Secondary | ICD-10-CM | POA: Diagnosis not present

## 2019-05-08 DIAGNOSIS — R911 Solitary pulmonary nodule: Secondary | ICD-10-CM | POA: Insufficient documentation

## 2019-05-08 DIAGNOSIS — I700 Atherosclerosis of aorta: Secondary | ICD-10-CM | POA: Insufficient documentation

## 2019-05-08 DIAGNOSIS — K449 Diaphragmatic hernia without obstruction or gangrene: Secondary | ICD-10-CM | POA: Diagnosis not present

## 2019-05-08 LAB — GLUCOSE, CAPILLARY: Glucose-Capillary: 115 mg/dL — ABNORMAL HIGH (ref 70–99)

## 2019-05-08 MED ORDER — FLUDEOXYGLUCOSE F - 18 (FDG) INJECTION
10.8000 | Freq: Once | INTRAVENOUS | Status: AC | PRN
  Administered 2019-05-08: 10.8 via INTRAVENOUS

## 2019-05-09 NOTE — Progress Notes (Signed)
Thoracic Location of Tumor / Histology: right upper lobe lung nodule  Patient presented with symptoms of:  He had a follow-up CT scan of the chest on 01/30/2019 which was felt to show a possible slight increase in the size of the right upper lobe lung mass.  He therefore had a super dimension CT of the chest on 03/20/2019.  This showed no significant change in the overall size of the right upper lobe lesion although the solid component was felt to be slightly larger.  On 04/08/2019 he underwent flexible bronchoscopy and ENB with biopsies of the right upper lobe lesion by Dr. Elsworth Soho.  These showed no evidence of malignancy.  1 of the cytology specimens from a transbronchial biopsy showed rare atypical cells.  Biopsies revealed: 04/08/19: 1 of the cytology specimens from a transbronchial biopsy showed rare atypical cells.  Tobacco/Marijuana/Snuff/ETOH use:  Tobacco Use  . Smoking status: Former Research scientist (life sciences)  . Smokeless tobacco: Never Used  Substance Use Topics  . Alcohol use: Not Currently    Frequency: Never  . Drug use: Never  He smoked 2 packs/day until age 78 when he quit, more than 60 pack years.   Past/Anticipated interventions by cardiothoracic surgery, if any: Per Dr. Cyndia Bent 04/30/19:  Plan:  The patient will be discussed at the multidisciplinary thoracic oncology conference and then further plans for work-up and treatment can be coordinated by Dr. Elsworth Soho.  Past/Anticipated interventions by medical oncology, if any: None at this time  Signs/Symptoms Weight changes, if any:  Wt Readings from Last 3 Encounters:  05/14/19 201 lb 6.4 oz (91.4 kg)  05/05/19 204 lb (92.5 kg)  04/30/19 204 lb (92.5 kg)      He says that he feels well overall and has been very active.  His only complaint is of some mucus production at times as well as wheezing and some coughing.  He denies any hemoptysis  Pain issues, if any:  Pt denies c/o pain.  SAFETY ISSUES:  Prior radiation? No  Pacemaker/ICD?  No  Possible current pregnancy? N/A  Is the patient on methotrexate? No  Current Complaints / other details:  Pt presents today for initial consult with Dr. Sondra Come for Radiation Oncology. Pt is anxious.  BP (!) 177/69 (BP Location: Left Arm)   Pulse (!) 52   Temp 98.9 F (37.2 C) (Temporal)   Resp 20   Ht _0  (1.803 m)   Wt 201 lb 6.4 oz (91.4 kg)   SpO2 99%   BMI 28.09 kg/m   Loma Sousa, RN BSN

## 2019-05-13 MED ORDER — SODIUM CHLORIDE (PF) 0.9 % IJ SOLN
INTRAMUSCULAR | Status: AC
  Filled 2019-05-13: qty 50

## 2019-05-13 NOTE — Progress Notes (Signed)
Radiation Oncology         ([phone]) [phone] ________________________________  Initial Outpatient Consultation  Name: Christopher Rocha MRN: [id number]  Date: 05/14/2019  DOB: [birth year]  WK:MQKM, Gara Kroner, MD  [name], MD   REFERRING PHYSICIAN: [name], MD  DIAGNOSIS: The encounter diagnosis was Mass of upper lobe of right lung.  HISTORY OF PRESENT ILLNESS::Christopher Rocha is a 78 y.o. male who is accompanied by no one due to Covid 19 restrictions. The patient presented for preprocedural chest x-ray on 05/02/2018 prior to cardiac catheterization. A new right upper lobe lung nodule was seen, new since last x-ray in 2014. He then underwent chest/abdomen/pelvis CT on 05/31/2019 in anticipation of TAVR procedure, which also evaluated the previously seen nodule. On this scan, the right upper lobe mass measured 4.2 cm. He underwent bronchoscopy on 06/01/2019, which yielded benign pathology.  He proceeded to PET scan on 06/13/2018, which showed hypermetabolic activity to the right upper lobe, unchanged since prior CT scan, with no evidence of metastatic mediastinal lymphadenopathy. An indeterminate focus of metabolic activity was also noted in the left lower lobe.  He underwent repeat chest CT on 01/30/2019, which showed mild increase in size of the RUL mass, now 5 cm. No thoracic adenopathy was seen. Mild nonspecific bronchiolitis was noted in the left lung base.   In anticipation of repeat bronchoscopy, he underwent super D chest CT on 03/20/2019, which showed the 5 cm RUL mass with progressive 4.5 cm solid component. An indeterminate 9 mm low right paratracheal node was also noted. Repeat bronchoscopy was performed on 04/08/2019 and showed no overt malignancy, however one of the cytology specimens showed rare atypical cells.  He was referred to Dr. Cyndia Bent on 04/30/2019 for surgical consultation. Repeat PET scan on 05/08/2019 showed: mild increase in size and uptake of RUL mass, pulmonary  adenocarcinoma cannot be excluded; hiatal hernia with intense FDG uptake identified which was present on his previous PET scan consistent with physiologic activity.   He is scheduled for CT-guided biopsy on 05/19/2019.   PREVIOUS RADIATION THERAPY: No  PAST MEDICAL HISTORY:  Past Medical History:  Diagnosis Date   Anemia    iron deficiency    Coronary artery disease    HLD (hyperlipidemia)    HTN (hypertension)    Pulmonary nodule    S/P CABG (coronary artery bypass graft)    S/P TAVR (transcatheter aortic valve replacement)    Edwards Sapien XT THV (size 29 mm, model # 9300TFX, serial # [id number])   Severe aortic stenosis     PAST SURGICAL HISTORY: Past Surgical History:  Procedure Laterality Date   BONE MARROW ASPIRATION     CARDIAC CATHETERIZATION     CARDIAC SURGERY     COLONOSCOPY     CORONARY ARTERY BYPASS GRAFT     EYE SURGERY     bilateral cataracts   FIBEROPTIC BRONCHOSCOPY  04/08/2019       RIGHT/LEFT HEART CATH AND CORONARY/GRAFT ANGIOGRAPHY N/A 05/08/2018   Procedure: RIGHT/LEFT HEART CATH AND CORONARY/GRAFT ANGIOGRAPHY;  Surgeon: Wellington Hampshire, MD;  Location: [place] [facility];  Service: Cardiovascular;  Laterality: N/A;   TEE WITHOUT CARDIOVERSION  07/02/2018   Procedure: TRANSESOPHAGEAL ECHOCARDIOGRAM (TEE);  Surgeon: Sherren Mocha, MD;  Location: [facility];  Service: Open Heart Surgery;;   TRANSCATHETER AORTIC VALVE REPLACEMENT, TRANSAPICAL N/A 07/02/2018   Procedure: TRANSCATHETER AORTIC VALVE REPLACEMENT, TRANSAPICAL;  Surgeon: Sherren Mocha, MD;  Location: [place];  Service: Open Heart Surgery;  Laterality:  N/A;   VIDEO BRONCHOSCOPY Bilateral 05/31/2018   Procedure: VIDEO BRONCHOSCOPY WITH FLUORO;  Surgeon: Rigoberto Noel, MD;  Location: [facility];  Service: Cardiopulmonary;  Laterality: Bilateral;   VIDEO BRONCHOSCOPY WITH ENDOBRONCHIAL NAVIGATION N/A 04/08/2019   Procedure: VIDEO BRONCHOSCOPY WITH ENDOBRONCHIAL NAVIGATION;  Surgeon:  Rigoberto Noel, MD;  Location: [facility];  Service: Thoracic;  Laterality: N/A;   VIDEO BRONCHOSCOPY WITH ENDOBRONCHIAL ULTRASOUND N/A 04/08/2019   Procedure: Video Bronchoscopy With Endobronchial Ultrasound;  Surgeon: Rigoberto Noel, MD;  Location: [facility];  Service: Thoracic;  Laterality: N/A;   WISDOM TOOTH EXTRACTION      FAMILY HISTORY:  Family History  Problem Relation Age of Onset   Diabetes Mother    Clotting disorder Father     SOCIAL HISTORY:  Social History   Tobacco Use   Smoking status: Former Smoker   Smokeless tobacco: Never Used  Substance Use Topics   Alcohol use: Not Currently    Frequency: Never   Drug use: Never    ALLERGIES:  Allergies  Allergen Reactions   Penicillins Hives and Rash    Did it involve swelling of the face/tongue/throat, SOB, or low BP? No Did it involve sudden or severe rash/hives, skin peeling, or any reaction on the inside of your mouth or nose? No Did you need to seek medical attention at a hospital or doctor's office? No When did it last happen?10-15 years If all above answers are "NO", may proceed with cephalosporin use.      MEDICATIONS:  Current Outpatient Medications  Medication Sig Dispense Refill   aspirin EC 81 MG tablet Take 81 mg by mouth every other day.      budesonide-formoterol (SYMBICORT) 160-4.5 MCG/ACT inhaler Inhale 2 puffs into the lungs 2 (two) times daily. 1 Inhaler 5   cholecalciferol (VITAMIN D3) 25 MCG (1000 UT) tablet Take 1,000 Units by mouth daily.     guaiFENesin (MUCINEX) 600 MG 12 hr tablet Take 1 tablet (600 mg total) by mouth 2 (two) times daily as needed for cough or to loosen phlegm. (Patient taking differently: Take 600 mg by mouth 2 (two) times a day. )     ibuprofen (ADVIL,MOTRIN) 200 MG tablet Take 200 mg by mouth 2 (two) times a day.      losartan-hydrochlorothiazide (HYZAAR) 100-12.5 MG tablet Take 1 tablet by mouth daily.     metoprolol tartrate (LOPRESSOR) 25 MG tablet Take  1 tablet (25 mg total) by mouth 2 (two) times daily. 60 tablet 1   Naphazoline-Glycerin (REDNESS RELIEF OP) Place 1 drop into both eyes daily as needed (for redness/itching).     PROAIR HFA 108 (90 Base) MCG/ACT inhaler Inhale 2 puffs into the lungs every 4 (four) hours as needed for shortness of breath.     rosuvastatin (CRESTOR) 10 MG tablet Take 1 tablet (10 mg total) by mouth daily. 90 tablet 1   No current facility-administered medications for this encounter.     REVIEW OF SYSTEMS:  A 10+ POINT REVIEW OF SYSTEMS WAS OBTAINED including neurology, dermatology, psychiatry, cardiac, respiratory, lymph, extremities, GI, GU, musculoskeletal, constitutional, reproductive, HEENT. He reports feeling well overall. His only complain is some mucus production, wheezing, and coughing. He denies pain and hemoptysis.    PHYSICAL EXAM:  height is _0  (1.803 m) and weight is 201 lb 6.4 oz (91.4 kg). His temporal temperature is 98.9 F (37.2 C). His blood pressure is 177/69 (abnormal) and his pulse is 52 (abnormal). His respiration is 20 and  oxygen saturation is 99%.   General: Alert and oriented, in no acute distress HEENT: Head is normocephalic. Extraocular movements are intact. Oropharynx is clear.  Dentures in place Neck: Neck is supple, no palpable cervical or supraclavicular lymphadenopathy. Heart: Regular in rate and rhythm with no murmurs, rubs, or gallops. Chest: Clear to auscultation bilaterally, with no rhonchi, wheezes, or rales.  Long scar noted along sternum from his previous CABG Abdomen: Soft, nontender, nondistended, with no rigidity or guarding. Extremities: No cyanosis or edema. Lymphatics: see Neck Exam Skin: No concerning lesions. Musculoskeletal: symmetric strength and muscle tone throughout. Neurologic: Cranial nerves II through XII are grossly intact. No obvious focalities. Speech is fluent. Coordination is intact. Psychiatric: Judgment and insight are intact. Affect is  appropriate.   ECOG = 1  0 - Asymptomatic (Fully active, able to carry on all predisease activities without restriction)  1 - Symptomatic but completely ambulatory (Restricted in physically strenuous activity but ambulatory and able to carry out work of a light or sedentary nature. For example, light housework, office work)  2 - Symptomatic, <50% in bed during the day (Ambulatory and capable of all self care but unable to carry out any work activities. Up and about more than 50% of waking hours)  3 - Symptomatic, >50% in bed, but not bedbound (Capable of only limited self-care, confined to bed or chair 50% or more of waking hours)  4 - Bedbound (Completely disabled. Cannot carry on any self-care. Totally confined to bed or chair)  5 - Death   Eustace Pen MM, Creech RH, Tormey DC, et al. [phone]). "Toxicity and response criteria of the [facility]". [place] Oncol. 5 (6): 649-55  LABORATORY DATA:  Lab Results  Component Value Date   WBC 7.8 04/02/2019   HGB 12.6 (L) 04/02/2019   HCT 39.4 04/02/2019   MCV 97.0 04/02/2019   PLT 204 04/02/2019   NEUTROABS 5.1 05/02/2018   Lab Results  Component Value Date   NA 138 04/02/2019   K 4.5 04/02/2019   CL 105 04/02/2019   CO2 25 04/02/2019   GLUCOSE 147 (H) 04/02/2019   CREATININE 1.13 04/02/2019   CALCIUM 9.7 04/02/2019      RADIOGRAPHY: Nm Pet Image Restag (ps) Skull Base To Thigh  Result Date: 05/08/2019 CLINICAL DATA:  Subsequent treatment strategy for lung mass. EXAM: NUCLEAR MEDICINE PET SKULL BASE TO THIGH TECHNIQUE: 10.8 mCi F-18 FDG was injected intravenously. Full-ring PET imaging was performed from the skull base to thigh after the radiotracer. CT data was obtained and used for attenuation correction and anatomic localization. Fasting blood glucose: 115 mg/dl COMPARISON:  PET-CT 06/13/2018 FINDINGS: Mediastinal blood pool activity: SUV max 2.8 Liver activity: SUV max NA NECK: No hypermetabolic lymph nodes  in the neck. Incidental CT findings: none CHEST: No FDG avid axillary, supraclavicular, mediastinal, or hilar lymph nodes. There is a moderate size hiatal hernia with intense internal FDG uptake with SUV max of 8.5. Right upper lobe part solid lung mass is again identified. This has a AP dimension of 5 cm and an SUV max of 3.59. On the previous exam this measured 4.6 cm with SUV max of 5.4. FDG avid nodule in the lateral left lung base has resolved in the interval favoring a postinflammatory or infectious etiology. Incidental CT findings: Aortic atherosclerosis. Previous median sternotomy, CABG procedure and aortic valvuloplasty. ABDOMEN/PELVIS: No abnormal hypermetabolic activity within the liver, pancreas, adrenal glands, or spleen. No hypermetabolic lymph nodes in the abdomen or pelvis.  Incidental CT findings: Aortic atherosclerosis. Bilateral kidney cysts, incompletely characterized without IV contrast. Bilateral hydroceles are incidentally noted. SKELETON: No focal hypermetabolic activity to suggest skeletal metastasis. Incidental CT findings: none IMPRESSION: 1. The right upper lobe part solid mass demonstrates mild increase in size compared with the PET-CT from 06/13/2018. Continued increased uptake is identified. Underlying low-grade indolent neoplasm such as pulmonary adenocarcinoma cannot be excluded. 2. Hiatal hernia with intense FDG uptake identified. This has SUV max of 8.5. Although findings may reflect underlying esophagitis esophageal neoplasm cannot be excluded. Advise correlation with direct visualization. 3.  Aortic Atherosclerosis (ICD10-I70.0). Electronically Signed   By: Kerby Moors M.D.   On: 05/08/2019 15:38      IMPRESSION: This 78 year old gentleman has a 5 cm hypermetabolic right upper lobe lung mass that was initially found on CT scan of the chest done in work-up for TAVR in September 2019.  Initial flexible bronchoscopy with biopsies and brushings showed no evidence of malignancy.   He underwent successful TAVR and the lesion has been followed by Dr. Elsworth Soho.  He had follow-up CT scans of the chest in May and July 2020.  I have personally reviewed all of his CT scans and this lesion has changed slightly  from his initial scan last September.  He recently underwent ENB with biopsies which were negative for malignancy.  It is certainly possible that this could be a slow-growing adenocarcinoma.  Patient was seen by Dr. Cyndia Bent and he is felt not to be a candidate for right upper lobe lobectomy given the patient's age and respiratory status with  severe obstructive airway disease and moderate reduction in his diffusion capacity noted on his PFTs last September.  If this is a bronchogenic carcinoma then I think radiation therapy would be a good  option for him.  Patient was presented at the multidisciplinary thoracic conference and it was recommended further work-up was indicated.  Patient recently completed PET scan as above and is scheduled for CT-guided biopsy of the mass on September 14.  Patient does  appear interested in proceeding with radiation therapy if biopsy confirms malignancy.  He also seems to be willing to consider radiation therapy without definite tissue diagnosis given the size of the lesion and its slow progression, suggestive of malignancy.  Today, I talked to the patient  about the findings and work-up thus far.  We discussed the natural history of lung cancer and general treatment, highlighting the role of radiotherapy in the management.  We discussed the available radiation techniques, and focused on the details of logistics and delivery.  We reviewed the anticipated acute and late sequelae associated with radiation in this setting.  The patient was encouraged to ask questions that I answered to the best of my ability.    PLAN: Patient will be seen 3 to 4 days after his upcoming CT-guided biopsy for further evaluation and likely simulation.  He would  be a candidate for  hypofractionated accelerated radiation therapy, but doubt he would be a candidate for stereotactic body radiation therapy given the size of the lesion.     ------------------------------------------------  Blair Promise, PhD, MD  This document serves as a record of services personally performed by Gery Pray, MD. It was created on his behalf by Wilburn Mylar, a trained medical scribe. The creation of this record is based on the scribe's personal observations and the provider's statements to them. This document has been checked and approved by the attending provider.

## 2019-05-14 ENCOUNTER — Other Ambulatory Visit: Payer: Self-pay

## 2019-05-14 ENCOUNTER — Ambulatory Visit
Admission: RE | Admit: 2019-05-14 | Discharge: 2019-05-14 | Disposition: A | Discharge status: Home / Self Care | Payer: Medicare Other | Source: Ambulatory Visit | Attending: Radiation Oncology | Admitting: Radiation Oncology

## 2019-05-14 ENCOUNTER — Encounter: Payer: Self-pay | Admitting: Radiation Oncology

## 2019-05-14 VITALS — BP 177/69 | HR 52 | Temp 98.9°F | Resp 20 | Ht 71.0 in | Wt 201.4 lb

## 2019-05-14 DIAGNOSIS — Z87891 Personal history of nicotine dependence: Secondary | ICD-10-CM | POA: Diagnosis not present

## 2019-05-14 DIAGNOSIS — R918 Other nonspecific abnormal finding of lung field: Secondary | ICD-10-CM | POA: Insufficient documentation

## 2019-05-14 DIAGNOSIS — Z79899 Other long term (current) drug therapy: Secondary | ICD-10-CM | POA: Insufficient documentation

## 2019-05-14 DIAGNOSIS — E785 Hyperlipidemia, unspecified: Secondary | ICD-10-CM | POA: Insufficient documentation

## 2019-05-14 DIAGNOSIS — I10 Essential (primary) hypertension: Secondary | ICD-10-CM | POA: Insufficient documentation

## 2019-05-14 DIAGNOSIS — Z7982 Long term (current) use of aspirin: Secondary | ICD-10-CM | POA: Diagnosis not present

## 2019-05-14 DIAGNOSIS — K449 Diaphragmatic hernia without obstruction or gangrene: Secondary | ICD-10-CM | POA: Diagnosis not present

## 2019-05-14 DIAGNOSIS — I2510 Atherosclerotic heart disease of native coronary artery without angina pectoris: Secondary | ICD-10-CM | POA: Diagnosis not present

## 2019-05-14 DIAGNOSIS — D509 Iron deficiency anemia, unspecified: Secondary | ICD-10-CM | POA: Diagnosis not present

## 2019-05-14 DIAGNOSIS — I700 Atherosclerosis of aorta: Secondary | ICD-10-CM | POA: Insufficient documentation

## 2019-05-14 NOTE — Patient Instructions (Signed)
Coronavirus (COVID-19) Are you at risk?  Are you at risk for the Coronavirus (COVID-19)?  To be considered HIGH RISK for Coronavirus (COVID-19), you have to meet the following criteria:  . Traveled to [place], [place], [place], [place] or [place]; or in the [place] to [place], [place], [place], or [place]; and have fever, cough, and shortness of breath within the last 2 weeks of travel OR . Been in close contact with a person diagnosed with COVID-19 within the last 2 weeks and have fever, cough, and shortness of breath . IF YOU DO NOT MEET THESE CRITERIA, YOU ARE CONSIDERED LOW RISK FOR COVID-19.  What to do if you are HIGH RISK for COVID-19?  . If you are having a medical emergency, call 911. . Seek medical care right away. Before you go to a doctor's office, urgent care or emergency department, call ahead and tell them about your recent travel, contact with someone diagnosed with COVID-19, and your symptoms. You should receive instructions from your physician's office regarding next steps of care.  . When you arrive at healthcare provider, tell the healthcare staff immediately you have returned from visiting [place], [place], [place], [place] or [place]; or traveled in the [place] to [place], [place], [place], or [place]; in the last two weeks or you have been in close contact with a person diagnosed with COVID-19 in the last 2 weeks.   . Tell the health care staff about your symptoms: fever, cough and shortness of breath. . After you have been seen by a medical provider, you will be either: o Tested for (COVID-19) and discharged home on quarantine except to seek medical care if symptoms worsen, and asked to  - Stay home and avoid contact with others until you get your results (4-5 days)  - Avoid travel on public transportation if possible (such as bus, train, or airplane) or o Sent to the Emergency Department by EMS for evaluation, COVID-19 testing, and possible  admission depending on your condition and test results.  What to do if you are LOW RISK for COVID-19?  Reduce your risk of any infection by using the same precautions used for avoiding the common cold or flu:  . Wash your hands often with soap and warm water for at least 20 seconds.  If soap and water are not readily available, use an alcohol-based hand sanitizer with at least 60% alcohol.  . If coughing or sneezing, cover your mouth and nose by coughing or sneezing into the elbow areas of your shirt or coat, into a tissue or into your sleeve (not your hands). . Avoid shaking hands with others and consider head nods or verbal greetings only. . Avoid touching your eyes, nose, or mouth with unwashed hands.  . Avoid close contact with people who are sick. . Avoid places or events with large numbers of people in one location, like concerts or sporting events. . Carefully consider travel plans you have or are making. . If you are planning any travel outside or inside the [place], visit the CDC's Travelers' Health webpage for the latest health notices. . If you have some symptoms but not all symptoms, continue to monitor at home and seek medical attention if your symptoms worsen. . If you are having a medical emergency, call 911.   ADDITIONAL HEALTHCARE OPTIONS FOR PATIENTS  [facility] / e-Visit: [email or web address]         [facility]: [phone]  [facility]: [phone]                   [facility]: [phone]   

## 2019-05-15 ENCOUNTER — Ambulatory Visit: Payer: Medicare Other | Admitting: Cardiology

## 2019-05-16 ENCOUNTER — Ambulatory Visit (HOSPITAL_COMMUNITY)
Admission: RE | Admit: 2019-05-16 | Discharge: 2019-05-16 | Disposition: A | Discharge status: Home / Self Care | Payer: Medicare Other | Source: Ambulatory Visit | Attending: Surgery | Admitting: Surgery

## 2019-05-16 ENCOUNTER — Other Ambulatory Visit: Payer: Self-pay | Admitting: Student

## 2019-05-16 DIAGNOSIS — Z20828 Contact with and (suspected) exposure to other viral communicable diseases: Secondary | ICD-10-CM | POA: Insufficient documentation

## 2019-05-16 DIAGNOSIS — Z01812 Encounter for preprocedural laboratory examination: Secondary | ICD-10-CM | POA: Diagnosis not present

## 2019-05-16 LAB — SARS CORONAVIRUS 2 (TAT 6-24 HRS): SARS Coronavirus 2: NEGATIVE

## 2019-05-19 ENCOUNTER — Ambulatory Visit (HOSPITAL_COMMUNITY)
Admission: RE | Admit: 2019-05-19 | Discharge: 2019-05-19 | Disposition: A | Discharge status: Home / Self Care | Payer: Medicare Other | Source: Ambulatory Visit | Attending: Interventional Radiology | Admitting: Interventional Radiology

## 2019-05-19 ENCOUNTER — Other Ambulatory Visit: Payer: Self-pay

## 2019-05-19 ENCOUNTER — Ambulatory Visit (HOSPITAL_COMMUNITY)
Admission: RE | Admit: 2019-05-19 | Discharge: 2019-05-19 | Disposition: A | Discharge status: Home / Self Care | Payer: Medicare Other | Source: Ambulatory Visit | Attending: Surgery | Admitting: Surgery

## 2019-05-19 ENCOUNTER — Ambulatory Visit (HOSPITAL_COMMUNITY): Admission: RE | Admit: 2019-05-19 | Payer: Medicare Other | Source: Ambulatory Visit

## 2019-05-19 DIAGNOSIS — E785 Hyperlipidemia, unspecified: Secondary | ICD-10-CM | POA: Diagnosis not present

## 2019-05-19 DIAGNOSIS — Z952 Presence of prosthetic heart valve: Secondary | ICD-10-CM | POA: Diagnosis not present

## 2019-05-19 DIAGNOSIS — Z9889 Other specified postprocedural states: Secondary | ICD-10-CM | POA: Diagnosis not present

## 2019-05-19 DIAGNOSIS — Z951 Presence of aortocoronary bypass graft: Secondary | ICD-10-CM | POA: Insufficient documentation

## 2019-05-19 DIAGNOSIS — I10 Essential (primary) hypertension: Secondary | ICD-10-CM | POA: Diagnosis not present

## 2019-05-19 DIAGNOSIS — Z79899 Other long term (current) drug therapy: Secondary | ICD-10-CM | POA: Diagnosis not present

## 2019-05-19 DIAGNOSIS — Z87891 Personal history of nicotine dependence: Secondary | ICD-10-CM | POA: Insufficient documentation

## 2019-05-19 DIAGNOSIS — D381 Neoplasm of uncertain behavior of trachea, bronchus and lung: Secondary | ICD-10-CM | POA: Diagnosis not present

## 2019-05-19 DIAGNOSIS — R918 Other nonspecific abnormal finding of lung field: Secondary | ICD-10-CM | POA: Diagnosis not present

## 2019-05-19 DIAGNOSIS — Z7982 Long term (current) use of aspirin: Secondary | ICD-10-CM | POA: Diagnosis not present

## 2019-05-19 DIAGNOSIS — Z833 Family history of diabetes mellitus: Secondary | ICD-10-CM | POA: Insufficient documentation

## 2019-05-19 DIAGNOSIS — C3411 Malignant neoplasm of upper lobe, right bronchus or lung: Secondary | ICD-10-CM | POA: Diagnosis not present

## 2019-05-19 DIAGNOSIS — Z880 Allergy status to penicillin: Secondary | ICD-10-CM | POA: Insufficient documentation

## 2019-05-19 DIAGNOSIS — I25810 Atherosclerosis of coronary artery bypass graft(s) without angina pectoris: Secondary | ICD-10-CM | POA: Diagnosis not present

## 2019-05-19 DIAGNOSIS — R911 Solitary pulmonary nodule: Secondary | ICD-10-CM | POA: Insufficient documentation

## 2019-05-19 DIAGNOSIS — J939 Pneumothorax, unspecified: Secondary | ICD-10-CM | POA: Diagnosis not present

## 2019-05-19 LAB — CBC
HCT: 39.8 % (ref 39.0–52.0)
Hemoglobin: 12.9 g/dL — ABNORMAL LOW (ref 13.0–17.0)
MCH: 31 pg (ref 26.0–34.0)
MCHC: 32.4 g/dL (ref 30.0–36.0)
MCV: 95.7 fL (ref 80.0–100.0)
Platelets: 250 10*3/uL (ref 150–400)
RBC: 4.16 MIL/uL — ABNORMAL LOW (ref 4.22–5.81)
RDW: 13 % (ref 11.5–15.5)
WBC: 10 10*3/uL (ref 4.0–10.5)
nRBC: 0 % (ref 0.0–0.2)

## 2019-05-19 LAB — PROTIME-INR
INR: 1.1 (ref 0.8–1.2)
Prothrombin Time: 14.2 seconds (ref 11.4–15.2)

## 2019-05-19 MED ORDER — MIDAZOLAM HCL 2 MG/2ML IJ SOLN
INTRAMUSCULAR | Status: AC
  Filled 2019-05-19: qty 2

## 2019-05-19 MED ORDER — SODIUM CHLORIDE 0.9 % IV SOLN: INTRAVENOUS | Status: DC

## 2019-05-19 MED ORDER — HYDROCODONE-ACETAMINOPHEN 5-325 MG PO TABS
1.0000 | ORAL_TABLET | ORAL | Status: DC | PRN
  Filled 2019-05-19: qty 2

## 2019-05-19 MED ORDER — FENTANYL CITRATE (PF) 100 MCG/2ML IJ SOLN
INTRAMUSCULAR | Status: AC | PRN
  Administered 2019-05-19: 50 ug via INTRAVENOUS

## 2019-05-19 MED ORDER — FENTANYL CITRATE (PF) 100 MCG/2ML IJ SOLN
INTRAMUSCULAR | Status: AC
  Filled 2019-05-19: qty 2

## 2019-05-19 MED ORDER — MIDAZOLAM HCL 2 MG/2ML IJ SOLN
INTRAMUSCULAR | Status: AC | PRN
  Administered 2019-05-19: 1 mg via INTRAVENOUS

## 2019-05-19 MED ORDER — LIDOCAINE HCL 1 % IJ SOLN
INTRAMUSCULAR | Status: AC
  Filled 2019-05-19: qty 20

## 2019-05-19 NOTE — Progress Notes (Signed)
XRay dept here to do CXR

## 2019-05-19 NOTE — Progress Notes (Addendum)
Wife called and updated informed her we will call her back after next CXR Pt placed on RA per PA o2 sat 100%

## 2019-05-19 NOTE — Procedures (Signed)
  Procedure: CT core RUL mass   EBL:   minimal Complications:  sm asx ptx  See full dictation in [facility].  Dillard Cannon MD Main # [phone] Pager  [phone]

## 2019-05-19 NOTE — Discharge Instructions (Addendum)
Lung Biopsy, Care After This sheet gives you information about how to care for yourself after your procedure. Your health care provider may also give you more specific instructions depending on the type of biopsy you had. If you have problems or questions, contact your health care provider. What can I expect after the procedure? After the procedure, it is common to have:  A cough.  A sore throat.  Pain where a needle, bronchoscope, or incision was used to collect a biopsy sample (biopsy site). Follow these instructions at home: Medicines  Take over-the-counter and prescription medicines only as told by your health care provider.  Do not drink alcohol if your health care provider tells you not to drink.  Ask your health care provider if the medicine prescribed to you: ? Requires you to avoid driving or using heavy machinery. ? Can cause constipation. You may need to take these actions to prevent or treat constipation:  Drink enough fluid to keep your urine pale yellow.  Take over-the-counter or prescription medicines.  Eat foods that are high in fiber, such as beans, whole grains, and fresh fruits and vegetables.  Limit foods that are high in fat and processed sugars, such as fried or sweet foods.  Do not drive for 24 hours if you were given a sedative. Biopsy site care   Follow instructions from your health care provider about how to take care of your biopsy site. Make sure you: ? Wash your hands with soap and water before and after you change your bandage (dressing). If soap and water are not available, use hand sanitizer. ? Change your dressing as told by your health care provider. ? Leave stitches (sutures), skin glue, or adhesive strips in place. These skin closures may need to stay in place for 2 weeks or longer. If adhesive strip edges start to loosen and curl up, you may trim the loose edges. Do not remove adhesive strips completely unless your health care provider tells  you to do that.  Do not take baths, swim, or use a hot tub until your health care provider approves. Ask your health care provider if you may take showers. You may only be allowed to take sponge baths.  Check your biopsy site every day for signs of infection. Check for: ? Redness, swelling, or more pain. ? Fluid or blood. ? Warmth. ? Pus or a bad smell. General instructions  Return to your normal activities as told by your health care provider. Ask your health care provider what activities are safe for you.  It is up to you to get the results of your procedure. Ask your health care provider, or the department that is doing the procedure, when your results will be ready.  Keep all follow-up visits as told by your health care provider. This is important. Contact a health care provider if:  You have a fever.  You have redness, swelling, or more pain around your biopsy site.  You have fluid or blood coming from your biopsy site.  Your biopsy site feels warm to the touch.  You have pus or a bad smell coming from your biopsy site.  You have pain that does not get better with medicine. Get help right away if:  You cough up blood.  You have trouble breathing.  You have chest pain.  You lose consciousness. Summary  After the procedure, it is common to have a sore throat and a cough.  Return to your normal activities as told by your  health care provider. Ask your health care provider what activities are safe for you.  Take over-the-counter and prescription medicines only as told by your health care provider.  Report any unusual symptoms to your health care provider. This information is not intended to replace advice given to you by your health care provider. Make sure you discuss any questions you have with your health care provider. Document Released: 09/19/2016 Document Revised: 09/25/2018 Document Reviewed: 09/19/2016 Elsevier Patient Education  [place]. Moderate  Conscious Sedation, Adult, Care After These instructions provide you with information about caring for yourself after your procedure. Your health care provider may also give you more specific instructions. Your treatment has been planned according to current medical practices, but problems sometimes occur. Call your health care provider if you have any problems or questions after your procedure. What can I expect after the procedure? After your procedure, it is common:  To feel sleepy for several hours.  To feel clumsy and have poor balance for several hours.  To have poor judgment for several hours.  To vomit if you eat too soon. Follow these instructions at home: For at least 24 hours after the procedure:   Do not: ? Participate in activities where you could fall or become injured. ? Drive. ? Use heavy machinery. ? Drink alcohol. ? Take sleeping pills or medicines that cause drowsiness. ? Make important decisions or sign legal documents. ? Take care of children on your own.  Rest. Eating and drinking  Follow the diet recommended by your health care provider.  If you vomit: ? Drink water, juice, or soup when you can drink without vomiting. ? Make sure you have little or no nausea before eating solid foods. General instructions  Have a responsible adult stay with you until you are awake and alert.  Take over-the-counter and prescription medicines only as told by your health care provider.  If you smoke, do not smoke without supervision.  Keep all follow-up visits as told by your health care provider. This is important. Contact a health care provider if:  You keep feeling nauseous or you keep vomiting.  You feel light-headed.  You develop a rash.  You have a fever. Get help right away if:  You have trouble breathing. This information is not intended to replace advice given to you by your health care provider. Make sure you discuss any questions you have with your  health care provider. Document Released: 06/11/2013 Document Revised: 08/03/2017 Document Reviewed: 12/11/2015 Elsevier Patient Education  2020 [employer].

## 2019-05-19 NOTE — Progress Notes (Signed)
Dr Vernard Gambles called related to CXR results. States he will place orders.

## 2019-05-19 NOTE — H&P (Signed)
Chief Complaint: Right lung mass  Referring Physician(s): [name]  Supervising Physician: Arne Cleveland  Patient Status: [facility] - Out-pt  History of Present Illness: Christopher Rocha is a 78 y.o. male who presented for preprocedural chest x-ray on 05/02/2018 prior to cardiac catheterization.   A new right upper lobe lung nodule was seen, new since last x-ray in 2014.   He then underwent chest/abdomen/pelvis CT on 05/31/2019 in anticipation of TAVR procedure, which also evaluated the previously seen nodule. The right upper lobe mass measured 4.2 cm.   He underwent bronchoscopy on 06/01/2019, which yielded benign pathology.  Recent PET showed = The right upper lobe part solid mass demonstrates mild increase in size compared with the PET-CT from 06/13/2018. Continued increased uptake is identified. Underlying low-grade indolent neoplasm such as pulmonary adenocarcinoma cannot be excluded.  He is here today for biopsy of a lung mass.  He is NPO. No nausea/vomiting. No Fever/chills. ROS negative.   Past Medical History:  Diagnosis Date  . Anemia    iron deficiency   . Coronary artery disease   . HLD (hyperlipidemia)   . HTN (hypertension)   . Pulmonary nodule   . S/P CABG (coronary artery bypass graft)   . S/P TAVR (transcatheter aortic valve replacement)    Edwards Sapien XT THV (size 29 mm, model # 9300TFX, serial # [id number])  . Severe aortic stenosis     Past Surgical History:  Procedure Laterality Date  . BONE MARROW ASPIRATION    . CARDIAC CATHETERIZATION    . CARDIAC SURGERY    . COLONOSCOPY    . CORONARY ARTERY BYPASS GRAFT    . EYE SURGERY     bilateral cataracts  . FIBEROPTIC BRONCHOSCOPY  04/08/2019      . RIGHT/LEFT HEART CATH AND CORONARY/GRAFT ANGIOGRAPHY N/A 05/08/2018   Procedure: RIGHT/LEFT HEART CATH AND CORONARY/GRAFT ANGIOGRAPHY;  Surgeon: Wellington Hampshire, MD;  Location: [place] [facility];  Service: Cardiovascular;  Laterality: N/A;   . TEE WITHOUT CARDIOVERSION  07/02/2018   Procedure: TRANSESOPHAGEAL ECHOCARDIOGRAM (TEE);  Surgeon: Sherren Mocha, MD;  Location: [facility];  Service: Open Heart Surgery;;  . TRANSCATHETER AORTIC VALVE REPLACEMENT, TRANSAPICAL N/A 07/02/2018   Procedure: TRANSCATHETER AORTIC VALVE REPLACEMENT, TRANSAPICAL;  Surgeon: Sherren Mocha, MD;  Location: [facility];  Service: Open Heart Surgery;  Laterality: N/A;  . VIDEO BRONCHOSCOPY Bilateral 05/31/2018   Procedure: VIDEO BRONCHOSCOPY WITH FLUORO;  Surgeon: Rigoberto Noel, MD;  Location: [facility];  Service: Cardiopulmonary;  Laterality: Bilateral;  . VIDEO BRONCHOSCOPY WITH ENDOBRONCHIAL NAVIGATION N/A 04/08/2019   Procedure: VIDEO BRONCHOSCOPY WITH ENDOBRONCHIAL NAVIGATION;  Surgeon: Rigoberto Noel, MD;  Location: [facility];  Service: Thoracic;  Laterality: N/A;  . VIDEO BRONCHOSCOPY WITH ENDOBRONCHIAL ULTRASOUND N/A 04/08/2019   Procedure: Video Bronchoscopy With Endobronchial Ultrasound;  Surgeon: Rigoberto Noel, MD;  Location: [facility];  Service: Thoracic;  Laterality: N/A;  . WISDOM TOOTH EXTRACTION      Allergies: Penicillins  Medications: Prior to Admission medications   Medication Sig Start Date End Date Taking? Authorizing Provider  acetaminophen (TYLENOL) 500 MG tablet Take 500 mg by mouth every 8 (eight) hours as needed (pain).   Yes [provider]  aspirin EC 81 MG tablet Take 81 mg by mouth every other day.    Yes [provider]  budesonide-formoterol (SYMBICORT) 160-4.5 MCG/ACT inhaler Inhale 2 puffs into the lungs 2 (two) times daily. Patient taking differently: Inhale 2 puffs into the lungs 2 (two)  times daily as needed (asthma).  12/18/18  Yes Rigoberto Noel, MD  losartan-hydrochlorothiazide (HYZAAR) 100-12.5 MG tablet Take 1 tablet by mouth daily.   Yes [provider]  metoprolol tartrate (LOPRESSOR) 25 MG tablet Take 1 tablet (25 mg total) by mouth 2 (two) times daily. 07/07/18  Yes Lars Pinks M, PA-C   rosuvastatin (CRESTOR) 10 MG tablet Take 1 tablet (10 mg total) by mouth daily. Patient taking differently: Take 5 mg by mouth daily.  05/07/19 08/05/19 Yes Revankar, Reita Cliche, MD  Naphazoline-Glycerin (REDNESS RELIEF OP) Place 1 drop into both eyes daily as needed (for redness/itching).    [provider]  PROAIR HFA 108 [phone] Base) MCG/ACT inhaler Inhale 2 puffs into the lungs every 4 (four) hours as needed for shortness of breath. 01/23/19   [provider]     Family History  Problem Relation Age of Onset  . Diabetes Mother   . Clotting disorder Father     Social History   Socioeconomic History  . Marital status: Married    Spouse name: Not on file  . Number of children: Not on file  . Years of education: Not on file  . Highest education level: Not on file  Occupational History  . Not on file  Social Needs  . Financial resource strain: Not on file  . Food insecurity    Worry: Not on file    Inability: Not on file  . Transportation needs    Medical: Not on file    Non-medical: Not on file  Tobacco Use  . Smoking status: Former Research scientist (life sciences)  . Smokeless tobacco: Never Used  Substance and Sexual Activity  . Alcohol use: Not Currently    Frequency: Never  . Drug use: Never  . Sexual activity: Not on file  Lifestyle  . Physical activity    Days per week: Not on file    Minutes per session: Not on file  . Stress: Not on file  Relationships  . Social Herbalist on phone: Not on file    Gets together: Not on file    Attends religious service: Not on file    Active member of club or organization: Not on file    Attends meetings of clubs or organizations: Not on file    Relationship status: Not on file  Other Topics Concern  . Not on file  Social History Narrative  . Not on file     Review of Systems: A 12 point ROS discussed and pertinent positives are indicated in the HPI above.  All other systems are negative.  Review of Systems  Vital Signs:  BP (!) 202/82   Pulse (!) 48   Temp 97.9 F (36.6 C) (Skin)   Resp 16   Ht 5\' 11"  (1.803 m)   Wt 91.2 kg   SpO2 99%   BMI 28.03 kg/m   Physical Exam Vitals signs reviewed.  Constitutional:      Appearance: Normal appearance.  HENT:     Head: Normocephalic and atraumatic.  Eyes:     Extraocular Movements: Extraocular movements intact.  Cardiovascular:     Rate and Rhythm: Normal rate and regular rhythm.  Pulmonary:     Effort: Pulmonary effort is normal.     Comments: Mild rhonchi bilaterally Abdominal:     General: There is no distension.     Palpations: Abdomen is soft.  Musculoskeletal: Normal range of motion.  Skin:    General: Skin is  warm and dry.  Neurological:     General: No focal deficit present.     Mental Status: He is alert and oriented to person, place, and time.  Psychiatric:        Mood and Affect: Mood normal.        Behavior: Behavior normal.        Thought Content: Thought content normal.        Judgment: Judgment normal.     Imaging: Nm Pet Image Restag (ps) Skull Base To Thigh  Result Date: 05/08/2019 CLINICAL DATA:  Subsequent treatment strategy for lung mass. EXAM: NUCLEAR MEDICINE PET SKULL BASE TO THIGH TECHNIQUE: 10.8 mCi F-18 FDG was injected intravenously. Full-ring PET imaging was performed from the skull base to thigh after the radiotracer. CT data was obtained and used for attenuation correction and anatomic localization. Fasting blood glucose: 115 mg/dl COMPARISON:  PET-CT 06/13/2018 FINDINGS: Mediastinal blood pool activity: SUV max 2.8 Liver activity: SUV max NA NECK: No hypermetabolic lymph nodes in the neck. Incidental CT findings: none CHEST: No FDG avid axillary, supraclavicular, mediastinal, or hilar lymph nodes. There is a moderate size hiatal hernia with intense internal FDG uptake with SUV max of 8.5. Right upper lobe part solid lung mass is again identified. This has a AP dimension of 5 cm and an SUV max of 3.59. On the previous  exam this measured 4.6 cm with SUV max of 5.4. FDG avid nodule in the lateral left lung base has resolved in the interval favoring a postinflammatory or infectious etiology. Incidental CT findings: Aortic atherosclerosis. Previous median sternotomy, CABG procedure and aortic valvuloplasty. ABDOMEN/PELVIS: No abnormal hypermetabolic activity within the liver, pancreas, adrenal glands, or spleen. No hypermetabolic lymph nodes in the abdomen or pelvis. Incidental CT findings: Aortic atherosclerosis. Bilateral kidney cysts, incompletely characterized without IV contrast. Bilateral hydroceles are incidentally noted. SKELETON: No focal hypermetabolic activity to suggest skeletal metastasis. Incidental CT findings: none IMPRESSION: 1. The right upper lobe part solid mass demonstrates mild increase in size compared with the PET-CT from 06/13/2018. Continued increased uptake is identified. Underlying low-grade indolent neoplasm such as pulmonary adenocarcinoma cannot be excluded. 2. Hiatal hernia with intense FDG uptake identified. This has SUV max of 8.5. Although findings may reflect underlying esophagitis esophageal neoplasm cannot be excluded. Advise correlation with direct visualization. 3.  Aortic Atherosclerosis (ICD10-I70.0). Electronically Signed   By: Kerby Moors M.D.   On: 05/08/2019 15:38    Labs:  CBC: Recent Labs    07/04/18 0422 07/05/18 0325 07/06/18 0342 04/02/19 1033  WBC 25.5* 21.0* 14.3* 7.8  HGB 9.6* 8.4* 8.6* 12.6*  HCT 30.7* 27.2* 27.7* 39.4  PLT 135* 134* 154 204    COAGS: Recent Labs    06/28/18 1022  INR 1.03  APTT 29    BMP: Recent Labs    07/06/18 0342 07/07/18 0409 07/11/18 1554 03/19/19 1438 04/02/19 1033  NA 135 136 143 137 138  K 3.9 3.6 4.2 4.3 4.5  CL 105 106 102 102 105  CO2 25 23 25 27 25   GLUCOSE 143* 135* 134* 150* 147*  BUN 69* 54* 17 18 14   CALCIUM 9.0 9.2 9.2 9.9 9.7  CREATININE 1.88* 1.25* 1.03 1.11 1.13  GFRNONAA 33* 54* 70  --  >60   GFRAA 38* >60 81  --  >60    LIVER FUNCTION TESTS: Recent Labs    06/28/18 1022  BILITOT 0.6  AST 18  ALT 21  ALKPHOS 72  PROT 7.1  ALBUMIN  4.0    TUMOR MARKERS: No results for input(s): AFPTM, CEA, CA199, CHROMGRNA in the last 8760 hours.  Assessment and Plan:  Right upper lobe lung mass  Will proceed with image guided biopsy of the right upper lung mass today by Dr. Vernard Gambles.  Risks and benefits of CT guided lung nodule biopsy was discussed with the patient including, but not limited to bleeding, hemoptysis, respiratory failure requiring intubation, infection, pneumothorax requiring chest tube placement, stroke from air embolism or even death.  All of the patient's questions were answered and the patient is agreeable to proceed.  Consent signed and in chart.  Electronically Signed: Murrell Redden, PA-C   05/19/2019, 9:47 AM      I spent a total of  30 Minutes   in face to face in clinical consultation, greater than 50% of which was counseling/coordinating care for lung biopsy.

## 2019-05-19 NOTE — Progress Notes (Addendum)
Updated pt wife Donnette Discharge instructions reviewed with pt and his wife .

## 2019-05-19 NOTE — Progress Notes (Addendum)
Gareth Eagle PA called and states pt is ok to be discharged.CXR report reviewed.  Pt and his wife instructed to come to the ED for shortness of breath and/or pain. Both voice understanding

## 2019-05-21 DIAGNOSIS — Z23 Encounter for immunization: Secondary | ICD-10-CM | POA: Diagnosis not present

## 2019-05-21 DIAGNOSIS — E538 Deficiency of other specified B group vitamins: Secondary | ICD-10-CM | POA: Diagnosis not present

## 2019-05-21 LAB — ACID FAST CULTURE WITH REFLEXED SENSITIVITIES (MYCOBACTERIA): Acid Fast Culture: NEGATIVE

## 2019-05-26 ENCOUNTER — Ambulatory Visit
Admission: RE | Admit: 2019-05-26 | Discharge: 2019-05-26 | Disposition: A | Discharge status: Home / Self Care | Payer: Medicare Other | Source: Ambulatory Visit | Attending: Radiation Oncology | Admitting: Radiation Oncology

## 2019-05-26 ENCOUNTER — Ambulatory Visit (HOSPITAL_COMMUNITY)
Admission: RE | Admit: 2019-05-26 | Discharge: 2019-05-26 | Disposition: A | Discharge status: Home / Self Care | Payer: Medicare Other | Source: Ambulatory Visit | Attending: Radiation Oncology | Admitting: Radiation Oncology

## 2019-05-26 ENCOUNTER — Other Ambulatory Visit: Payer: Self-pay

## 2019-05-26 DIAGNOSIS — J939 Pneumothorax, unspecified: Secondary | ICD-10-CM | POA: Diagnosis not present

## 2019-05-26 DIAGNOSIS — Z510 Encounter for antineoplastic radiation therapy: Secondary | ICD-10-CM | POA: Insufficient documentation

## 2019-05-26 DIAGNOSIS — R918 Other nonspecific abnormal finding of lung field: Secondary | ICD-10-CM | POA: Diagnosis not present

## 2019-05-26 DIAGNOSIS — C3411 Malignant neoplasm of upper lobe, right bronchus or lung: Secondary | ICD-10-CM | POA: Diagnosis not present

## 2019-05-26 NOTE — Progress Notes (Signed)
  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  Identity was confirmed.  All relevant records and images related to the planned course of therapy were reviewed.  The patient freely provided informed written consent to proceed with treatment after reviewing the details related to the planned course of therapy. The consent form was witnessed and verified by the simulation staff.  Then, the patient was set-up in a stable reproducible  supine position for radiation therapy.  A BodyFix immobilization pillow was fabricated for reproducible positioning.  Then I personally applied the abdominal compression paddle to limit respiratory excursion.  4D respiratoy motion management CT images were obtained.  Surface markings were placed.  The CT images were loaded into the planning software.  Then, using Cine, MIP, and standard views, the internal target volume (ITV) and planning target volumes (PTV) were delinieated, and avoidance structures were contoured.  Treatment planning then occurred.  The radiation prescription was entered and confirmed.  A total of two complex treatment devices were fabricated in the form of the BodyFix immobilization pillow and a neck accuform cushion.  I have requested : 3D Simulation  I have requested a DVH of the following structures: Heart, Lungs, Esophagus, Chest Wall, Brachial Plexus, Major Blood Vessels, and targets.  PLAN:  The patient will receive 54 Gy in 3 fractions.  -----------------------------------  Blair Promise, PhD, MD  This document serves as a record of services personally performed by Gery Pray, MD. It was created on his  behalf by Wilburn Mylar, a trained medical scribe. The creation of this record is based on the scribe's personal observations and the provider's statements to them. This document has been checked and approved by the attending provider.

## 2019-05-30 DIAGNOSIS — C3411 Malignant neoplasm of upper lobe, right bronchus or lung: Secondary | ICD-10-CM | POA: Diagnosis not present

## 2019-05-30 DIAGNOSIS — Z510 Encounter for antineoplastic radiation therapy: Secondary | ICD-10-CM | POA: Diagnosis not present

## 2019-06-03 ENCOUNTER — Ambulatory Visit
Admission: RE | Admit: 2019-06-03 | Discharge: 2019-06-03 | Disposition: A | Discharge status: Home / Self Care | Payer: Medicare Other | Source: Ambulatory Visit | Attending: Radiation Oncology | Admitting: Radiation Oncology

## 2019-06-03 ENCOUNTER — Other Ambulatory Visit: Payer: Self-pay

## 2019-06-03 DIAGNOSIS — C3411 Malignant neoplasm of upper lobe, right bronchus or lung: Secondary | ICD-10-CM | POA: Diagnosis not present

## 2019-06-03 DIAGNOSIS — R918 Other nonspecific abnormal finding of lung field: Secondary | ICD-10-CM

## 2019-06-03 DIAGNOSIS — Z510 Encounter for antineoplastic radiation therapy: Secondary | ICD-10-CM | POA: Diagnosis not present

## 2019-06-03 NOTE — Progress Notes (Signed)
  Radiation Oncology         [phone] ________________________________  Name: Tharon Bomar MRN: [id number]  Date: 06/03/2019  DOB: 22-Apr-1941  Stereotactic Body Radiotherapy Treatment Procedure Note  NARRATIVE:  Tadarrius Burch was brought to the stereotactic radiation treatment machine and placed supine on the CT couch. The patient was set up for stereotactic body radiotherapy on the body fix pillow.  3D TREATMENT PLANNING AND DOSIMETRY:  The patient's radiation plan was reviewed and approved prior to starting treatment.  It showed 3-dimensional radiation distributions overlaid onto the planning CT.  The [facility] for the target structures as well as the organs at risk were reviewed. The documentation of this is filed in the radiation oncology EMR.  SIMULATION VERIFICATION:  The patient underwent CT imaging on the treatment unit.  These were carefully aligned to document that the ablative radiation dose would cover the target volume and maximally spare the nearby organs at risk according to the planned distribution.  SPECIAL TREATMENT PROCEDURE: Larnell Granlund [facility] received high dose ablative stereotactic body radiotherapy to the planned target volume without unforeseen complications. Treatment was delivered uneventfully. The high doses associated with stereotactic body radiotherapy and the significant potential risks require careful treatment set up and patient monitoring constituting a special treatment procedure   STEREOTACTIC TREATMENT MANAGEMENT:  Following delivery, the patient was evaluated clinically. The patient tolerated treatment without significant acute effects, and was discharged to home in stable condition.    PLAN: Continue treatment as planned.  ________________________________  Blair Promise, PhD, MD

## 2019-06-04 ENCOUNTER — Telehealth: Payer: Self-pay | Admitting: Pulmonary Disease

## 2019-06-04 NOTE — Telephone Encounter (Signed)
Pt has multiple CT scans ordered. According to last office note from 02/04/2019, the following was recommended by RA:   Proceed with CT-guided needle biopsy of right upper lobe infiltrate We discussed risks and benefits  Most recent CT scan ordered was 04/11/2019 and stated "to be scheduled in 3 months."   RA, please review CT scan orders and advise when this pt will need a CT scan done. Thank you.

## 2019-06-05 ENCOUNTER — Other Ambulatory Visit: Payer: Self-pay

## 2019-06-05 ENCOUNTER — Ambulatory Visit
Admission: RE | Admit: 2019-06-05 | Discharge: 2019-06-05 | Disposition: A | Discharge status: Home / Self Care | Payer: Medicare Other | Source: Ambulatory Visit | Attending: Radiation Oncology | Admitting: Radiation Oncology

## 2019-06-05 DIAGNOSIS — Z510 Encounter for antineoplastic radiation therapy: Secondary | ICD-10-CM | POA: Insufficient documentation

## 2019-06-05 DIAGNOSIS — C3411 Malignant neoplasm of upper lobe, right bronchus or lung: Secondary | ICD-10-CM | POA: Diagnosis not present

## 2019-06-05 DIAGNOSIS — R918 Other nonspecific abnormal finding of lung field: Secondary | ICD-10-CM

## 2019-06-05 NOTE — Telephone Encounter (Signed)
RA is in office today 06/05/19 wait for him to address

## 2019-06-05 NOTE — Telephone Encounter (Signed)
Cancel our order. He had the biopsy done and is undergoing radiation therapy-new order will be placed after his radiation Please ensure he has follow-up with [place] in 3 months

## 2019-06-05 NOTE — Progress Notes (Signed)
  Radiation Oncology         [phone] ________________________________  Name: Christopher Rocha MRN: [id number]  Date: 06/05/2019  DOB: 08/12/1941  Stereotactic Body Radiotherapy Treatment Procedure Note  NARRATIVE:  Christopher Rocha was brought to the stereotactic radiation treatment machine and placed supine on the CT couch. The patient was set up for stereotactic body radiotherapy on the body fix pillow.  3D TREATMENT PLANNING AND DOSIMETRY:  The patient's radiation plan was reviewed and approved prior to starting treatment.  It showed 3-dimensional radiation distributions overlaid onto the planning CT.  The [facility] for the target structures as well as the organs at risk were reviewed. The documentation of this is filed in the radiation oncology EMR.  SIMULATION VERIFICATION:  The patient underwent CT imaging on the treatment unit.  These were carefully aligned to document that the ablative radiation dose would cover the target volume and maximally spare the nearby organs at risk according to the planned distribution.  SPECIAL TREATMENT PROCEDURE: Christopher Rocha [facility] received high dose ablative stereotactic body radiotherapy to the planned target volume without unforeseen complications. Treatment was delivered uneventfully. The high doses associated with stereotactic body radiotherapy and the significant potential risks require careful treatment set up and patient monitoring constituting a special treatment procedure   STEREOTACTIC TREATMENT MANAGEMENT:  Following delivery, the patient was evaluated clinically. The patient tolerated treatment without significant acute effects, and was discharged to home in stable condition.    PLAN: Continue treatment as planned.  ________________________________  Blair Promise, PhD, MD

## 2019-06-05 NOTE — Telephone Encounter (Signed)
Cancelled recent CT order. Recall has been placed for follow up in 3 months.

## 2019-06-05 NOTE — Addendum Note (Signed)
Addended by: Valerie Salts on: 06/05/2019 05:21 PM   Modules accepted: Orders

## 2019-06-10 ENCOUNTER — Ambulatory Visit
Admission: RE | Admit: 2019-06-10 | Discharge: 2019-06-10 | Disposition: A | Discharge status: Home / Self Care | Payer: Medicare Other | Source: Ambulatory Visit | Attending: Radiation Oncology | Admitting: Radiation Oncology

## 2019-06-10 ENCOUNTER — Other Ambulatory Visit: Payer: Self-pay

## 2019-06-10 ENCOUNTER — Encounter: Payer: Self-pay | Admitting: Radiation Oncology

## 2019-06-10 DIAGNOSIS — R918 Other nonspecific abnormal finding of lung field: Secondary | ICD-10-CM

## 2019-06-10 DIAGNOSIS — Z923 Personal history of irradiation: Secondary | ICD-10-CM

## 2019-06-10 DIAGNOSIS — C3411 Malignant neoplasm of upper lobe, right bronchus or lung: Secondary | ICD-10-CM | POA: Diagnosis not present

## 2019-06-10 DIAGNOSIS — Z510 Encounter for antineoplastic radiation therapy: Secondary | ICD-10-CM | POA: Diagnosis not present

## 2019-06-10 HISTORY — DX: Personal history of irradiation: Z92.3

## 2019-06-10 NOTE — Progress Notes (Signed)
  Radiation Oncology         [phone] ________________________________  Name: Tekoa Amon MRN: [id number]  Date: 06/10/2019  DOB: 1941-01-26  Stereotactic Body Radiotherapy Treatment Procedure Note  NARRATIVE:  Hakim Minniefield was brought to the stereotactic radiation treatment machine and placed supine on the CT couch. The patient was set up for stereotactic body radiotherapy on the body fix pillow.  3D TREATMENT PLANNING AND DOSIMETRY:  The patient's radiation plan was reviewed and approved prior to starting treatment.  It showed 3-dimensional radiation distributions overlaid onto the planning CT.  The [facility] for the target structures as well as the organs at risk were reviewed. The documentation of this is filed in the radiation oncology EMR.  SIMULATION VERIFICATION:  The patient underwent CT imaging on the treatment unit.  These were carefully aligned to document that the ablative radiation dose would cover the target volume and maximally spare the nearby organs at risk according to the planned distribution.  SPECIAL TREATMENT PROCEDURE: [facility] received high dose ablative stereotactic body radiotherapy to the planned target volume without unforeseen complications. Treatment was delivered uneventfully. The high doses associated with stereotactic body radiotherapy and the significant potential risks require careful treatment set up and patient monitoring constituting a special treatment procedure   STEREOTACTIC TREATMENT MANAGEMENT:  Following delivery, the patient was evaluated clinically. The patient tolerated treatment without significant acute effects, and was discharged to home in stable condition.    PLAN: Continue treatment as planned.  ________________________________  Blair Promise, PhD, MD

## 2019-06-18 DIAGNOSIS — E538 Deficiency of other specified B group vitamins: Secondary | ICD-10-CM | POA: Diagnosis not present

## 2019-06-18 NOTE — Progress Notes (Signed)
[facility] [place]                                       Cardiology Office Note    Date:  06/19/2019   ID:  Christopher Rocha, DOB 1941-06-04, MRN [id number]  PCP:  [name], MD  Cardiologist: Dr. Geraldo Pitter / Dr. Burt Knack & Dr. Cyndia Bent (TAVR)  CC: 1 year s/p TAVR  History of Present Illness:  Christopher Rocha is a 78 y.o. 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  Christopher Rocha has developed severe symptomatic aortic stenosis. Mean transaortic gradient was 60 mmHg in the setting of normal LV systolic function. Cardiac catheterization demonstrated patency of his bypass grafts. 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 He has been evaluated by Dr Elsworth Soho with pulmonary and Dr Cyndia Bent with cardiothoracic surgery. We have all discussed his case extensively and agree that it is indicated and appropriate to proceed with TAVR to relieve symptoms of critical aortic stenosis and allow for further workup and treatment of his lung mass.  He underwent successful TAVR with a 29 mm Edwards Sapien 3 THV via the TA approach on 07/02/18. Post operative echo showed EF 55%, normally functioning TAVR with mean gradient of 4 mm Hg and trivial PVL. 1 month echo showed EF 60%, normally functioning TAVR with mean gradient 13 mm Hg and no PVL.  He was followed by Dr. Cyndia Bent for his lung mass and he underwent CT biopsy on 05/19/19. Pathology showed adenocarcinoma and he is being treated with XRT.  Today he presents to clinic for follow up. Holds breath when doing things which makes him a little short of breath. No CP. No LE edema, orthopnea or PND. No dizziness or syncope. No blood in stool or urine. No palpitations. He occasionally gets exertional leg and buttox pain that is resolved with rest. Has significant LE  cramping as well.     Past Medical History:  Diagnosis Date  . Anemia    iron deficiency   . Coronary artery disease   . HLD (hyperlipidemia)   . HTN (hypertension)   . Pulmonary nodule   . S/P CABG (coronary artery bypass graft)   . S/P TAVR (transcatheter aortic valve replacement)    Edwards Sapien XT THV (size 29 mm, model # 9300TFX, serial # [id number])  . Severe aortic stenosis     Past Surgical History:  Procedure Laterality Date  . BONE MARROW ASPIRATION    . CARDIAC CATHETERIZATION    . CARDIAC SURGERY    . COLONOSCOPY    . CORONARY ARTERY BYPASS GRAFT    . EYE SURGERY     bilateral cataracts  . FIBEROPTIC BRONCHOSCOPY  04/08/2019      . RIGHT/LEFT HEART CATH AND CORONARY/GRAFT ANGIOGRAPHY N/A 05/08/2018   Procedure: RIGHT/LEFT HEART CATH AND CORONARY/GRAFT ANGIOGRAPHY;  Surgeon: Wellington Hampshire, MD;  Location: [facility];  Service: Cardiovascular;  Laterality: N/A;  . TEE WITHOUT CARDIOVERSION  07/02/2018   Procedure: TRANSESOPHAGEAL ECHOCARDIOGRAM (TEE);  Surgeon: Sherren Mocha, MD;  Location: [facility];  Service: Open Heart Surgery;;  . TRANSCATHETER AORTIC VALVE REPLACEMENT, TRANSAPICAL N/A 07/02/2018   Procedure: TRANSCATHETER AORTIC VALVE REPLACEMENT, TRANSAPICAL;  Surgeon:  Sherren Mocha, MD;  Location: [place];  Service: Open Heart Surgery;  Laterality: N/A;  . VIDEO BRONCHOSCOPY Bilateral 05/31/2018   Procedure: VIDEO BRONCHOSCOPY WITH FLUORO;  Surgeon: Rigoberto Noel, MD;  Location: [facility];  Service: Cardiopulmonary;  Laterality: Bilateral;  . VIDEO BRONCHOSCOPY WITH ENDOBRONCHIAL NAVIGATION N/A 04/08/2019   Procedure: VIDEO BRONCHOSCOPY WITH ENDOBRONCHIAL NAVIGATION;  Surgeon: Rigoberto Noel, MD;  Location: [facility];  Service: Thoracic;  Laterality: N/A;  . VIDEO BRONCHOSCOPY WITH ENDOBRONCHIAL ULTRASOUND N/A 04/08/2019   Procedure: Video Bronchoscopy With Endobronchial Ultrasound;  Surgeon: Rigoberto Noel, MD;  Location: [facility];  Service: Thoracic;   Laterality: N/A;  . WISDOM TOOTH EXTRACTION      Current Medications: Outpatient Medications Prior to Visit  Medication Sig Dispense Refill  . acetaminophen (TYLENOL) 500 MG tablet Take 500 mg by mouth every 8 (eight) hours as needed (pain).    Marland Kitchen aspirin EC 81 MG tablet Take 81 mg by mouth every other day.     . budesonide-formoterol (SYMBICORT) 160-4.5 MCG/ACT inhaler Inhale 2 puffs into the lungs 2 (two) times daily. (Patient taking differently: Inhale 2 puffs into the lungs 2 (two) times daily as needed (asthma). ) 1 Inhaler 5  . losartan-hydrochlorothiazide (HYZAAR) 100-12.5 MG tablet Take 1 tablet by mouth daily.    . metoprolol tartrate (LOPRESSOR) 25 MG tablet Take 1 tablet (25 mg total) by mouth 2 (two) times daily. 60 tablet 1  . Naphazoline-Glycerin (REDNESS RELIEF OP) Place 1 drop into both eyes daily as needed (for redness/itching).    Marland Kitchen PROAIR HFA 108 (90 Base) MCG/ACT inhaler Inhale 2 puffs into the lungs every 4 (four) hours as needed for shortness of breath.    . rosuvastatin (CRESTOR) 5 MG tablet Take 5 mg by mouth daily.    . rosuvastatin (CRESTOR) 10 MG tablet Take 1 tablet (10 mg total) by mouth daily. (Patient not taking: Reported on 06/19/2019) 90 tablet 1   No facility-administered medications prior to visit.      Allergies:   Penicillins   Social History   Socioeconomic History  . Marital status: Married    Spouse name: Not on file  . Number of children: Not on file  . Years of education: Not on file  . Highest education level: Not on file  Occupational History  . Not on file  Social Needs  . Financial resource strain: Not on file  . Food insecurity    Worry: Not on file    Inability: Not on file  . Transportation needs    Medical: Not on file    Non-medical: Not on file  Tobacco Use  . Smoking status: Former Research scientist (life sciences)  . Smokeless tobacco: Never Used  Substance and Sexual Activity  . Alcohol use: Not Currently    Frequency: Never  . Drug use: Never   . Sexual activity: Not on file  Lifestyle  . Physical activity    Days per week: Not on file    Minutes per session: Not on file  . Stress: Not on file  Relationships  . Social Herbalist on phone: Not on file    Gets together: Not on file    Attends religious service: Not on file    Active member of club or organization: Not on file    Attends meetings of clubs or organizations: Not on file    Relationship status: Not on file  Other Topics Concern  . Not on file  Social History  Narrative  . Not on file     Family History:  The patient's family history includes Clotting disorder in his father; Diabetes in his mother.      ROS:   Please see the history of present illness.    ROS All other systems reviewed and are negative.   PHYSICAL EXAM:   VS:  BP (!) 161/80   Pulse 84   Ht _0  (1.803 m)   Wt 199 lb 12.8 oz (90.6 kg)   SpO2 95%   BMI 27.87 kg/m    GEN: chronically ill appeaing HEENT: normal Neck: no JVD or masses Cardiac: RRR; no murmur. No rubs, or gallops,no edema  Respiratory:  clear to auscultation bilaterally, normal work of breathing GI: soft, nontender, nondistended, + BS MS: no deformity or atrophy Skin: warm and dry, no rash Neuro:  Alert and Oriented x 3, Strength and sensation are intact Psych: euthymic mood, full affect   Wt Readings from Last 3 Encounters:  06/19/19 199 lb 12.8 oz (90.6 kg)  05/19/19 201 lb (91.2 kg)  05/14/19 201 lb 6.4 oz (91.4 kg)      Studies/Labs Reviewed:   EKG:  EKG is ordered today.  Sinus brady with HR 56, 1 st deg AV block with LVH and QRS widening /repol abnormality.   Recent Labs: 06/28/2018: ALT 21; B Natriuretic Peptide 175.5 07/04/2018: Magnesium 2.2 04/02/2019: BUN 14; Creatinine, Ser 1.13; Potassium 4.5; Sodium 138 05/19/2019: Hemoglobin 12.9; Platelets 250   Lipid Panel    Component Value Date/Time   CHOL 216 (H) 05/05/2019 0939   TRIG 99 05/05/2019 0939   HDL 54 05/05/2019 0939    CHOLHDL 4.0 05/05/2019 0939   LDLCALC 144 (H) 05/05/2019 0939    Additional studies/ records that were reviewed today include:  CARDIOTHORACIC SURGERY OPERATIVE NOTE  Date of Procedure:07/02/2018  Procedure:   Transcatheter Aortic Valve Replacement - Transapical Approach Edwards Sapien XT THV (size 34m, model # 9300TFX, serial # [id number]  Co-Surgeons:Bryan KAlveria Apley MD and MSherren Mocha MD  Pre-operative Echo Findings: ? severe aortic stenosis ? Normalleft ventricular systolic function   Post-operative Echo Findings: ? Mildparavalvular leak ? Normalleft ventricular systolic function  _____________   Echo 08/15/18 Study Conclusions - Procedure narrative: Transthoracic echocardiography. Image   quality was suboptimal. The study was technically difficult.   Intravenous contrast (Definity) was administered to opacify the   LV. - Left ventricle: The cavity size was normal. Wall thickness was   increased in a pattern of moderate LVH. Systolic function was   normal. The estimated ejection fraction was in the range of 60%   to 65%. Wall motion was normal; there were no regional wall   motion abnormalities. - Aortic valve: A bioprosthesis was present. Mean gradient (S): 13   mm Hg. Peak gradient (S): 27 mm Hg. - Mitral valve: There was mild regurgitation. - Left atrium: The atrium was mildly dilated.  _____________  Echo 06/19/19 IMPRESSIONS  1. Left ventricular ejection fraction, by visual estimation, is 55 to 60%. The left ventricle has normal function. Normal left ventricular size. There is mildly increased left ventricular hypertrophy.  2. Global right ventricle has normal systolic function.The right ventricular size is normal. No increase in right ventricular wall thickness.  3. Left atrial size was mildly dilated.  4. Right atrial size was normal.  5. Moderate calcification of the mitral valve  leaflet(s).  6. Severe mitral annular calcification.  7. Severe thickening of the mitral valve leaflet(s).  8. The  mitral valve is degenerative. No evidence of mitral valve regurgitation. Mild mitral stenosis.  9. Mild functional MS due to severe MAC with mean gradient 3 mmHg peak 7 mmHg MVA PT1/2 1.57 cm2. 10. The tricuspid valve is normal in structure. Tricuspid valve regurgitation is mild. 11. Aortic valve regurgitation was not visualized by color flow Doppler. 12. Post TAVR with 29 mm Sapien 3 valve No PVL gradients have increased since 08/15/18 when mean was 13 and peak 27 mmHg. 13. The pulmonic valve was grossly normal. Pulmonic valve regurgitation is mild by color flow Doppler. 14. Normal pulmonary artery systolic pressure. 15. The interatrial septum was not well visualized.    ASSESSMENT & PLAN:   Severe AS s/p TAVR: echo today shows EF 55%, normally functioning TAVR with mild increase in gradients (mean gradient from 13 mm Hg to 13m Hg) and no PVL.Gradient is a little higher than expected given 29 mm valve. Will plan for repeat echo in 6 months given continued increase in gradients over time. He has NYHA class II symptoms; mainly of fatigue. SBE prophylaxis discussed; he has full dentures and does not visit the dentist.   HTN: Bp elevated today. He has a history of white coat HTN. Review of all recent visits show elevated BPs. He follows his BP closely at home and SBP usually runs ~120-130s. Sometimes it's labile and runs in low 100s.   Chronic diastolic CHF: appears euvolemic. Continue Hyzaar 100-12.555mdaily.  Lung adenocarcinoma: completed XRT with Dr. KiEarney HamburgHe will have a repeat CT in a few months.  CAD: pre TAVR cath showed patent bypass grafts. Continue medical therapy.  Liver lesions: incidental finding on pre TAVR CT. "Indeterminate liver lesions, as above. These could be definitively characterized with nonemergent MRI of the abdomen with and without IV gadolinium  if clinically appropriate." PET scan done for lung mass showed normal liver: " No abnormal activity in the liver. No hypermetabolic upper abdominal lymph nodes. No pelvic Lymphadenopathy." This is being followed by his PCP.   Hip/buttox pain: suspicious for PAD. I offered to order arterial dopplers/ABIs but he is not interested at this time.    Medication Adjustments/Labs and Tests Ordered: Current medicines are reviewed at length with the patient today.  Concerns regarding medicines are outlined above.  Medication changes, Labs and Tests ordered today are listed in the Patient Instructions below. Patient Instructions  Medication Instructions:  Your provider recommends that you continue on your current medications as directed. Please refer to the Current Medication list given to you today.     Follow-Up: Please keep your upcoming appointment with Dr. ReGeraldo Pitter   Signed, KaAngelena FormPA-C  06/19/2019 2:37 PM    [facility] [address]  [id number]: [phone]: [phone]

## 2019-06-19 ENCOUNTER — Ambulatory Visit (HOSPITAL_COMMUNITY): Payer: Medicare Other | Attending: Cardiovascular Disease

## 2019-06-19 ENCOUNTER — Ambulatory Visit (INDEPENDENT_AMBULATORY_CARE_PROVIDER_SITE_OTHER): Payer: Medicare Other | Admitting: Physician Assistant

## 2019-06-19 ENCOUNTER — Other Ambulatory Visit: Payer: Self-pay

## 2019-06-19 ENCOUNTER — Encounter: Payer: Self-pay | Admitting: Physician Assistant

## 2019-06-19 VITALS — BP 161/80 | HR 84 | Ht 71.0 in | Wt 199.8 lb

## 2019-06-19 DIAGNOSIS — I10 Essential (primary) hypertension: Secondary | ICD-10-CM | POA: Diagnosis not present

## 2019-06-19 DIAGNOSIS — I5032 Chronic diastolic (congestive) heart failure: Secondary | ICD-10-CM

## 2019-06-19 DIAGNOSIS — R918 Other nonspecific abnormal finding of lung field: Secondary | ICD-10-CM

## 2019-06-19 DIAGNOSIS — Z952 Presence of prosthetic heart valve: Secondary | ICD-10-CM

## 2019-06-19 LAB — ECHOCARDIOGRAM COMPLETE: Weight: 3196.8 oz

## 2019-06-19 NOTE — Patient Instructions (Signed)
Medication Instructions:  Your provider recommends that you continue on your current medications as directed. Please refer to the Current Medication list given to you today.     Follow-Up: Please keep your upcoming appointment with Dr. Geraldo Pitter!

## 2019-07-14 ENCOUNTER — Ambulatory Visit
Admission: RE | Admit: 2019-07-14 | Discharge: 2019-07-14 | Disposition: A | Discharge status: Home / Self Care | Payer: Medicare Other | Source: Ambulatory Visit | Attending: Radiation Oncology | Admitting: Radiation Oncology

## 2019-07-14 ENCOUNTER — Encounter: Payer: Self-pay | Admitting: Radiation Oncology

## 2019-07-14 ENCOUNTER — Other Ambulatory Visit: Payer: Self-pay

## 2019-07-14 VITALS — BP 204/78 | HR 62 | Temp 98.5°F | Resp 20 | Ht 71.0 in | Wt 201.0 lb

## 2019-07-14 DIAGNOSIS — R918 Other nonspecific abnormal finding of lung field: Secondary | ICD-10-CM | POA: Insufficient documentation

## 2019-07-14 DIAGNOSIS — R05 Cough: Secondary | ICD-10-CM | POA: Diagnosis not present

## 2019-07-14 DIAGNOSIS — Z923 Personal history of irradiation: Secondary | ICD-10-CM | POA: Diagnosis not present

## 2019-07-14 DIAGNOSIS — Z7982 Long term (current) use of aspirin: Secondary | ICD-10-CM | POA: Insufficient documentation

## 2019-07-14 DIAGNOSIS — Z79899 Other long term (current) drug therapy: Secondary | ICD-10-CM | POA: Diagnosis not present

## 2019-07-14 NOTE — Progress Notes (Incomplete)
°  Patient Name: Christopher Rocha MRN: [id number] DOB: 16-Aug-1941 Referring Physician: Gilford Raid (Profile Not Attached) Date of Service: 06/10/2019 [facility], [place]                                                        End Of Treatment Note  Diagnoses: C34.11-Malignant neoplasm of upper lobe, right bronchus or lung R91.8-Other nonspecific abnormal finding of lung field  Cancer Staging: 5 cm Hypermetabolic Lung Mass  Intent: Curative  Radiation Treatment Dates: 06/03/2019 through 06/10/2019 Site Technique Total Dose Dose per Fx Completed Fx Beam Energies  Thorax: Lung_Rt IMRT 54/54 18 3/3 6XFFF   Narrative: The patient tolerated radiation therapy relatively well. He denied any issues or complaints.  Plan: The patient will follow-up with radiation oncology in 1 month.  ________________________________________________   Blair Promise, PhD, MD  This document serves as a record of services personally performed by Gery Pray, MD. It was created on his behalf by Wilburn Mylar, a trained medical scribe. The creation of this record is based on the scribe's personal observations and the provider's statements to them. This document has been checked and approved by the attending provider.

## 2019-07-14 NOTE — Progress Notes (Signed)
Pt presents today for f/u with Dr. Sondra Come. Pt reports that radiation-associated fatigue has resolved. Pt denies c/o pain in chest. Pt denies SOB. Pt reports occasional cough with white sputum. Pt denies hemoptysis. Pt denies difficulty swallowing.   BP (!) 204/78   Pulse 62   Temp 98.5 F (36.9 C) (Temporal)   Resp 20   Ht 5\' 11"  (1.803 m)   Wt 201 lb (91.2 kg)   SpO2 100%   BMI 28.03 kg/m   Wt Readings from Last 3 Encounters:  07/14/19 201 lb (91.2 kg)  06/19/19 199 lb 12.8 oz (90.6 kg)  05/19/19 201 lb (91.2 kg)   Loma Sousa, RN BSN

## 2019-07-14 NOTE — Patient Instructions (Signed)
Coronavirus (COVID-19) Are you at risk?  Are you at risk for the Coronavirus (COVID-19)?  To be considered HIGH RISK for Coronavirus (COVID-19), you have to meet the following criteria:  . Traveled to [place], [place], [place], [place] or [place]; or in the [place] to [place], [place], [place], or [place]; and have fever, cough, and shortness of breath within the last 2 weeks of travel OR . Been in close contact with a person diagnosed with COVID-19 within the last 2 weeks and have fever, cough, and shortness of breath . IF YOU DO NOT MEET THESE CRITERIA, YOU ARE CONSIDERED LOW RISK FOR COVID-19.  What to do if you are HIGH RISK for COVID-19?  . If you are having a medical emergency, call 911. . Seek medical care right away. Before you go to a doctor's office, urgent care or emergency department, call ahead and tell them about your recent travel, contact with someone diagnosed with COVID-19, and your symptoms. You should receive instructions from your physician's office regarding next steps of care.  . When you arrive at healthcare provider, tell the healthcare staff immediately you have returned from visiting [place], [place], [place], [place] or [place]; or traveled in the [place] to [place], [place], [place], or [place]; in the last two weeks or you have been in close contact with a person diagnosed with COVID-19 in the last 2 weeks.   . Tell the health care staff about your symptoms: fever, cough and shortness of breath. . After you have been seen by a medical provider, you will be either: o Tested for (COVID-19) and discharged home on quarantine except to seek medical care if symptoms worsen, and asked to  - Stay home and avoid contact with others until you get your results (4-5 days)  - Avoid travel on public transportation if possible (such as bus, train, or airplane) or o Sent to the Emergency Department by EMS for evaluation, COVID-19 testing, and possible  admission depending on your condition and test results.  What to do if you are LOW RISK for COVID-19?  Reduce your risk of any infection by using the same precautions used for avoiding the common cold or flu:  . Wash your hands often with soap and warm water for at least 20 seconds.  If soap and water are not readily available, use an alcohol-based hand sanitizer with at least 60% alcohol.  . If coughing or sneezing, cover your mouth and nose by coughing or sneezing into the elbow areas of your shirt or coat, into a tissue or into your sleeve (not your hands). . Avoid shaking hands with others and consider head nods or verbal greetings only. . Avoid touching your eyes, nose, or mouth with unwashed hands.  . Avoid close contact with people who are sick. . Avoid places or events with large numbers of people in one location, like concerts or sporting events. . Carefully consider travel plans you have or are making. . If you are planning any travel outside or inside the [place], visit the CDC's Travelers' Health webpage for the latest health notices. . If you have some symptoms but not all symptoms, continue to monitor at home and seek medical attention if your symptoms worsen. . If you are having a medical emergency, call 911.   ADDITIONAL HEALTHCARE OPTIONS FOR PATIENTS  [facility] / e-Visit: [email or web address]         [facility]: [phone]  [facility]: [phone]                   [facility]: [phone]   

## 2019-07-14 NOTE — Progress Notes (Signed)
  Radiation Oncology         ([phone] ________________________________  Name: Christopher Rocha MRN: [id number]  Date: 07/14/2019  DOB: 10-Dec-1940  Follow-Up Visit Note  CC: [name], MD  [name], MD    ICD-10-CM   1. Mass of upper lobe of right lung  R91.8     Diagnosis: stage IIA (T2b, n0, m0) Adenocarcinoma presenting in the right upper lobe  Interval Since Last Radiation: One month.  06/03/2019 through 06/10/2019 Site Technique Total Dose Dose per Fx Completed Fx Beam Energies  Thorax: Lung_Rt IMRT 54/54 18 3/3 6XFFF     Narrative:  The patient returns today for routine follow-up. Radiation-associated fatigue has resolved.  He denies any new medical issues.  On review of systems, he reports occasional productive cough with white sputum. Denies chest pain, shortness of breath, hemoptysis, and difficulty swallowing.   ALLERGIES:  is allergic to penicillins.  Meds: Current Outpatient Medications  Medication Sig Dispense Refill  . acetaminophen (TYLENOL) 500 MG tablet Take 500 mg by mouth every 8 (eight) hours as needed (pain).    Marland Kitchen aspirin EC 81 MG tablet Take 81 mg by mouth every other day.     . budesonide-formoterol (SYMBICORT) 160-4.5 MCG/ACT inhaler Inhale 2 puffs into the lungs 2 (two) times daily. (Patient taking differently: Inhale 2 puffs into the lungs 2 (two) times daily as needed (asthma). ) 1 Inhaler 5  . losartan-hydrochlorothiazide (HYZAAR) 100-12.5 MG tablet Take 1 tablet by mouth daily.    . metoprolol tartrate (LOPRESSOR) 25 MG tablet Take 1 tablet (25 mg total) by mouth 2 (two) times daily. 60 tablet 1  . Naphazoline-Glycerin (REDNESS RELIEF OP) Place 1 drop into both eyes daily as needed (for redness/itching).    Marland Kitchen PROAIR HFA 108 (90 Base) MCG/ACT inhaler Inhale 2 puffs into the lungs every 4 (four) hours as needed for shortness of breath.    . rosuvastatin (CRESTOR) 5 MG tablet Take 5 mg by mouth daily.     No current  facility-administered medications for this encounter.     Physical Findings: The patient is in no acute distress. Patient is alert and oriented. Lungs are clear to auscultation bilaterally. Heart has regular rate and rhythm. No palpable cervical, supraclavicular, or axillary adenopathy. Abdomen soft, non-tender, normal bowel sounds.   height is 5\' 11"  (1.803 m) and weight is 201 lb (91.2 kg). His temporal temperature is 98.5 F (36.9 C). His blood pressure is 204/78 (abnormal) and his pulse is 62. His respiration is 20 and oxygen saturation is 100%. .   Lab Findings: Lab Results  Component Value Date   WBC 10.0 05/19/2019   HGB 12.9 (L) 05/19/2019   HCT 39.8 05/19/2019   MCV 95.7 05/19/2019   PLT 250 05/19/2019    Radiographic Findings: No results found.  Impression: The patient tolerated his stereotactic body radiation therapy quite well without any significant side effects.  Plan: he will be scheduled for chest CT scan in 3 months and then follow-up soon afterward.  ____________________________________ Gery Pray, MD  This document serves as a record of services personally performed by Gery Pray, MD. It was created on his behalf by Clerance Lav, a trained medical scribe. The creation of this record is based on the scribe's personal observations and the provider's statements to them. This document has been checked and approved by the attending provider.

## 2019-07-21 DIAGNOSIS — E538 Deficiency of other specified B group vitamins: Secondary | ICD-10-CM | POA: Diagnosis not present

## 2019-07-28 ENCOUNTER — Other Ambulatory Visit: Payer: Self-pay

## 2019-07-28 NOTE — Patient Outreach (Signed)
[facility]) Care Management  07/28/2019  Christopher Rocha [facility] Jul 20, 1941 [id number]   Medication Adherence call to Mr. Christopher Rocha Hippa Identifiers Verify spoke with patient he is past due on Losartan/Hctz 100/12.5,patient explain he is only taking it as needed because his blood pressure fluactuate,patient will go over it with his doctor on his next appointment.Christopher Rocha is showing past due under [place].   [facility] Management Direct Dial [phone]  Fax [phone] [email or web address]

## 2019-08-18 ENCOUNTER — Ambulatory Visit (INDEPENDENT_AMBULATORY_CARE_PROVIDER_SITE_OTHER): Payer: Medicare Other | Admitting: Cardiology

## 2019-08-18 ENCOUNTER — Other Ambulatory Visit: Payer: Self-pay

## 2019-08-18 ENCOUNTER — Encounter: Payer: Self-pay | Admitting: Cardiology

## 2019-08-18 VITALS — BP 180/78 | HR 53 | Ht 71.0 in | Wt 201.0 lb

## 2019-08-18 DIAGNOSIS — I10 Essential (primary) hypertension: Secondary | ICD-10-CM | POA: Diagnosis not present

## 2019-08-18 DIAGNOSIS — I2510 Atherosclerotic heart disease of native coronary artery without angina pectoris: Secondary | ICD-10-CM

## 2019-08-18 DIAGNOSIS — R918 Other nonspecific abnormal finding of lung field: Secondary | ICD-10-CM

## 2019-08-18 DIAGNOSIS — Z952 Presence of prosthetic heart valve: Secondary | ICD-10-CM

## 2019-08-18 DIAGNOSIS — E782 Mixed hyperlipidemia: Secondary | ICD-10-CM

## 2019-08-18 DIAGNOSIS — Z951 Presence of aortocoronary bypass graft: Secondary | ICD-10-CM

## 2019-08-18 NOTE — Progress Notes (Signed)
Cardiology Office Note:    Date:  08/18/2019   ID:  Christopher Rocha, DOB 1941-09-03, MRN [id number]  PCP:  [name], MD  Cardiologist:  Jenean Lindau, MD   Referring MD: [name], MD    ASSESSMENT:    1. S/P TAVR (transcatheter aortic valve replacement)   2. Essential hypertension   3. Coronary artery disease involving native coronary artery of native heart without angina pectoris   4. Mixed dyslipidemia   5. Mass of upper lobe of right lung   6. Hx of CABG    PLAN:    In order of problems listed above:  1. S/p TAVR: Stable at this time.  Echocardiogram report was discussed with the patient at length 2. Coronary artery disease: Stable.  Medical management.  Patient is on appropriate medications and will get complete blood work including fasting lipids. 3. Essential hypertension: Blood pressure stable 4. Mixed dyslipidemia: We will review blood work and inform the patient. 5. Lung cancer history: Managed by his primary care physician and specialists and the patient is receiving very close monitoring and therapy for the same. 6. Patient will be seen in follow-up appointment in 6 months or earlier if the patient has any concerns    Medication Adjustments/Labs and Tests Ordered: Current medicines are reviewed at length with the patient today.  Concerns regarding medicines are outlined above.  No orders of the defined types were placed in this encounter.  No orders of the defined types were placed in this encounter.    Chief Complaint  Patient presents with  . Follow-up     History of Present Illness:    Christopher Rocha is a 78 y.o. male.  Patient has past medical history of aortic stenosis post TAVR surgery, CABG, essential hypertension, dyslipidemia and coronary artery disease.  Unfortunately has been diagnosed with lung cancer and treated for it.  Overall he leads a sedentary lifestyle.  He has multiple medical issues as mentioned above.  He  denies any chest pain orthopnea or PND.  At the time of my evaluation, the patient is alert awake oriented and in no distress.  Past Medical History:  Diagnosis Date  . Anemia    iron deficiency   . Coronary artery disease   . HLD (hyperlipidemia)   . HTN (hypertension)   . Pulmonary nodule   . S/P CABG (coronary artery bypass graft)   . S/P TAVR (transcatheter aortic valve replacement)    Edwards Sapien XT THV (size 29 mm, model # 9300TFX, serial # [id number])  . Severe aortic stenosis     Past Surgical History:  Procedure Laterality Date  . BONE MARROW ASPIRATION    . CARDIAC CATHETERIZATION    . CARDIAC SURGERY    . COLONOSCOPY    . CORONARY ARTERY BYPASS GRAFT    . EYE SURGERY     bilateral cataracts  . FIBEROPTIC BRONCHOSCOPY  04/08/2019      . RIGHT/LEFT HEART CATH AND CORONARY/GRAFT ANGIOGRAPHY N/A 05/08/2018   Procedure: RIGHT/LEFT HEART CATH AND CORONARY/GRAFT ANGIOGRAPHY;  Surgeon: Wellington Hampshire, MD;  Location: [facility];  Service: Cardiovascular;  Laterality: N/A;  . TEE WITHOUT CARDIOVERSION  07/02/2018   Procedure: TRANSESOPHAGEAL ECHOCARDIOGRAM (TEE);  Surgeon: Sherren Mocha, MD;  Location: [facility];  Service: Open Heart Surgery;;  . TRANSCATHETER AORTIC VALVE REPLACEMENT, TRANSAPICAL N/A 07/02/2018   Procedure: TRANSCATHETER AORTIC VALVE REPLACEMENT, TRANSAPICAL;  Surgeon: Sherren Mocha, MD;  Location: [facility];  Service: Open  Heart Surgery;  Laterality: N/A;  . VIDEO BRONCHOSCOPY Bilateral 05/31/2018   Procedure: VIDEO BRONCHOSCOPY WITH FLUORO;  Surgeon: Rigoberto Noel, MD;  Location: [facility];  Service: Cardiopulmonary;  Laterality: Bilateral;  . VIDEO BRONCHOSCOPY WITH ENDOBRONCHIAL NAVIGATION N/A 04/08/2019   Procedure: VIDEO BRONCHOSCOPY WITH ENDOBRONCHIAL NAVIGATION;  Surgeon: Rigoberto Noel, MD;  Location: [facility];  Service: Thoracic;  Laterality: N/A;  . VIDEO BRONCHOSCOPY WITH ENDOBRONCHIAL ULTRASOUND N/A 04/08/2019   Procedure: Video Bronchoscopy With  Endobronchial Ultrasound;  Surgeon: Rigoberto Noel, MD;  Location: [facility];  Service: Thoracic;  Laterality: N/A;  . WISDOM TOOTH EXTRACTION      Current Medications: Current Meds  Medication Sig  . acetaminophen (TYLENOL) 500 MG tablet Take 500 mg by mouth every 8 (eight) hours as needed (pain).  Marland Kitchen aspirin EC 81 MG tablet Take 81 mg by mouth every other day.   . budesonide-formoterol (SYMBICORT) 160-4.5 MCG/ACT inhaler Inhale 2 puffs into the lungs 2 (two) times daily. (Patient taking differently: Inhale 2 puffs into the lungs 2 (two) times daily as needed (asthma). )  . metoprolol tartrate (LOPRESSOR) 25 MG tablet Take 1 tablet (25 mg total) by mouth 2 (two) times daily.  . Naphazoline-Glycerin (REDNESS RELIEF OP) Place 1 drop into both eyes daily as needed (for redness/itching).  Marland Kitchen PROAIR HFA 108 (90 Base) MCG/ACT inhaler Inhale 2 puffs into the lungs every 4 (four) hours as needed for shortness of breath.  . rosuvastatin (CRESTOR) 5 MG tablet Take 5 mg by mouth daily.     Allergies:   Penicillins   Social History   Socioeconomic History  . Marital status: Married    Spouse name: Not on file  . Number of children: Not on file  . Years of education: Not on file  . Highest education level: Not on file  Occupational History  . Not on file  Tobacco Use  . Smoking status: Former Research scientist (life sciences)  . Smokeless tobacco: Never Used  Substance and Sexual Activity  . Alcohol use: Not Currently  . Drug use: Never  . Sexual activity: Not on file  Other Topics Concern  . Not on file  Social History Narrative  . Not on file   Social Determinants of Health   Financial Resource Strain:   . Difficulty of Paying Living Expenses: Not on file  Food Insecurity:   . Worried About Charity fundraiser in the Last Year: Not on file  . Ran Out of Food in the Last Year: Not on file  Transportation Needs:   . Lack of Transportation (Medical): Not on file  . Lack of Transportation (Non-Medical): Not on  file  Physical Activity:   . Days of Exercise per Week: Not on file  . Minutes of Exercise per Session: Not on file  Stress:   . Feeling of Stress : Not on file  Social Connections:   . Frequency of Communication with Friends and Family: Not on file  . Frequency of Social Gatherings with Friends and Family: Not on file  . Attends Religious Services: Not on file  . Active Member of Clubs or Organizations: Not on file  . Attends Archivist Meetings: Not on file  . Marital Status: Not on file     Family History: The patient's family history includes Clotting disorder in his father; Diabetes in his mother.  ROS:   Please see the history of present illness.    All other systems reviewed and are negative.  EKGs/Labs/Other  Studies Reviewed:    The following studies were reviewed today: I discussed my findings with the patient at length.   Recent Labs: 04/02/2019: BUN 14; Creatinine, Ser 1.13; Potassium 4.5; Sodium 138 05/19/2019: Hemoglobin 12.9; Platelets 250  Recent Lipid Panel    Component Value Date/Time   CHOL 216 (H) 05/05/2019 0939   TRIG 99 05/05/2019 0939   HDL 54 05/05/2019 0939   CHOLHDL 4.0 05/05/2019 0939   LDLCALC 144 (H) 05/05/2019 0939    Physical Exam:    VS:  BP (!) 180/78 (BP Location: Right Arm, Patient Position: Sitting, Cuff Size: Normal)   Pulse (!) 53   Ht 5\' 11"  (1.803 m)   Wt 201 lb (91.2 kg)   SpO2 94%   BMI 28.03 kg/m     Wt Readings from Last 3 Encounters:  08/18/19 201 lb (91.2 kg)  07/14/19 201 lb (91.2 kg)  06/19/19 199 lb 12.8 oz (90.6 kg)     GEN: Patient is in no acute distress HEENT: Normal NECK: No JVD; No carotid bruits LYMPHATICS: No lymphadenopathy CARDIAC: Hear sounds regular, 2/6 systolic murmur at the apex. RESPIRATORY:  Clear to auscultation without rales, wheezing or rhonchi  ABDOMEN: Soft, non-tender, non-distended MUSCULOSKELETAL:  No edema; No deformity  SKIN: Warm and dry NEUROLOGIC:  Alert and  oriented x 3 PSYCHIATRIC:  Normal affect   Signed, Jenean Lindau, MD  08/18/2019 10:32 AM    [place]

## 2019-08-18 NOTE — Patient Instructions (Signed)
Medication Instructions:  Your physician recommends that you continue on your current medications as directed. Please refer to the Current Medication list given to you today.   *If you need a refill on your cardiac medications before your next appointment, please call your pharmacy*  Lab Work: Your physician recommends that you return for FASTING lab work in the next few days:  BMET CBC TSH LFT's Lipids  If you have labs (blood work) drawn today and your tests are completely normal, you will receive your results only by: Marland Kitchen MyChart Message (if you have MyChart) OR . A paper copy in the mail If you have any lab test that is abnormal or we need to change your treatment, we will call you to review the results.  Testing/Procedures: None Ordered  Follow-Up: At [facility], you and your health needs are our priority.  As part of our continuing mission to provide you with exceptional heart care, we have created designated Provider Care Teams.  These Care Teams include your primary Cardiologist (physician) and Advanced Practice Providers (APPs -  Physician Assistants and Nurse Practitioners) who all work together to provide you with the care you need, when you need it.  Your next appointment:   6 month(s)  The format for your next appointment:   In Person  Provider:   Jyl Heinz, MD

## 2019-08-19 DIAGNOSIS — E782 Mixed hyperlipidemia: Secondary | ICD-10-CM | POA: Diagnosis not present

## 2019-08-19 DIAGNOSIS — I2510 Atherosclerotic heart disease of native coronary artery without angina pectoris: Secondary | ICD-10-CM | POA: Diagnosis not present

## 2019-08-19 DIAGNOSIS — E538 Deficiency of other specified B group vitamins: Secondary | ICD-10-CM | POA: Diagnosis not present

## 2019-08-19 DIAGNOSIS — I10 Essential (primary) hypertension: Secondary | ICD-10-CM | POA: Diagnosis not present

## 2019-08-19 DIAGNOSIS — Z952 Presence of prosthetic heart valve: Secondary | ICD-10-CM | POA: Diagnosis not present

## 2019-08-20 LAB — HEPATIC FUNCTION PANEL
ALT: 9 IU/L (ref 0–44)
AST: 14 IU/L (ref 0–40)
Albumin: 4.1 g/dL (ref 3.7–4.7)
Alkaline Phosphatase: 106 IU/L (ref 39–117)
Bilirubin Total: 0.5 mg/dL (ref 0.0–1.2)
Bilirubin, Direct: 0.13 mg/dL (ref 0.00–0.40)
Total Protein: 7.2 g/dL (ref 6.0–8.5)

## 2019-08-20 LAB — LIPID PANEL
Chol/HDL Ratio: 4 ratio (ref 0.0–5.0)
Cholesterol, Total: 178 mg/dL (ref 100–199)
HDL: 44 mg/dL (ref >39)
LDL Chol Calc (NIH): 116 mg/dL — ABNORMAL HIGH (ref 0–99)
Triglycerides: 101 mg/dL (ref 0–149)
VLDL Cholesterol Cal: 18 mg/dL (ref 5–40)

## 2019-08-20 LAB — CBC
Hematocrit: 34.4 % — ABNORMAL LOW (ref 37.5–51.0)
Hemoglobin: 11.6 g/dL — ABNORMAL LOW (ref 13.0–17.7)
MCH: 30.4 pg (ref 26.6–33.0)
MCHC: 33.7 g/dL (ref 31.5–35.7)
MCV: 90 fL (ref 79–97)
Platelets: 216 10*3/uL (ref 150–450)
RBC: 3.82 x10E6/uL — ABNORMAL LOW (ref 4.14–5.80)
RDW: 12.3 % (ref 11.6–15.4)
WBC: 9.3 10*3/uL (ref 3.4–10.8)

## 2019-08-20 LAB — BASIC METABOLIC PANEL
BUN/Creatinine Ratio: 16 (ref 10–24)
BUN: 16 mg/dL (ref 8–27)
CO2: 23 mmol/L (ref 20–29)
Calcium: 9.7 mg/dL (ref 8.6–10.2)
Chloride: 100 mmol/L (ref 96–106)
Creatinine, Ser: 0.98 mg/dL (ref 0.76–1.27)
GFR calc Af Amer: 85 mL/min/{1.73_m2} (ref >59)
GFR calc non Af Amer: 74 mL/min/{1.73_m2} (ref >59)
Glucose: 119 mg/dL — ABNORMAL HIGH (ref 65–99)
Potassium: 4.8 mmol/L (ref 3.5–5.2)
Sodium: 140 mmol/L (ref 134–144)

## 2019-08-20 LAB — TSH: TSH: 1.2 u[IU]/mL (ref 0.450–4.500)

## 2019-08-25 ENCOUNTER — Telehealth: Payer: Self-pay | Admitting: *Deleted

## 2019-08-25 MED ORDER — ROSUVASTATIN CALCIUM 10 MG PO TABS: 10.0000 mg | ORAL_TABLET | Freq: Every day | ORAL | 3 refills | Status: DC

## 2019-08-25 NOTE — Telephone Encounter (Signed)
Telephone call to patient. Informed of lab results and need to increase crestor to 10 mg daily then repeat liver and lipids in 6 weeks. Pt verbalized understanding.

## 2019-08-25 NOTE — Telephone Encounter (Signed)
-----   Message from Jenean Lindau, MD sent at 08/20/2019  2:31 PM EST ----- Double statin and liver lipid check in 6 weeks. Jenean Lindau, MD 08/20/2019 2:30 PM

## 2019-09-12 DIAGNOSIS — Z923 Personal history of irradiation: Secondary | ICD-10-CM | POA: Diagnosis not present

## 2019-09-12 DIAGNOSIS — D509 Iron deficiency anemia, unspecified: Secondary | ICD-10-CM | POA: Diagnosis not present

## 2019-09-12 DIAGNOSIS — C3411 Malignant neoplasm of upper lobe, right bronchus or lung: Secondary | ICD-10-CM | POA: Diagnosis not present

## 2019-09-19 DIAGNOSIS — E538 Deficiency of other specified B group vitamins: Secondary | ICD-10-CM | POA: Diagnosis not present

## 2019-10-04 DIAGNOSIS — E7800 Pure hypercholesterolemia, unspecified: Secondary | ICD-10-CM | POA: Diagnosis not present

## 2019-10-04 DIAGNOSIS — I10 Essential (primary) hypertension: Secondary | ICD-10-CM | POA: Diagnosis not present

## 2019-10-04 DIAGNOSIS — D519 Vitamin B12 deficiency anemia, unspecified: Secondary | ICD-10-CM | POA: Diagnosis not present

## 2019-10-08 ENCOUNTER — Telehealth: Payer: Self-pay | Admitting: *Deleted

## 2019-10-08 NOTE — Telephone Encounter (Signed)
CALLED PATIENT TO INFORM OF STAT LABS ON 10-15-19 @ 1:45 PM, AND HIS CT ON 10-15-19- ARRIVAL TIME- 2:45 PM @ [facility], NO RESTRICTIONS TO TEST, LVM FOR A RETURN CALL

## 2019-10-15 ENCOUNTER — Ambulatory Visit: Payer: Medicare Other | Attending: Radiation Oncology

## 2019-10-15 ENCOUNTER — Ambulatory Visit (HOSPITAL_COMMUNITY): Payer: Medicare Other

## 2019-10-16 ENCOUNTER — Ambulatory Visit
Admission: RE | Admit: 2019-10-16 | Discharge: 2019-10-16 | Disposition: A | Discharge status: Home / Self Care | Payer: Medicare Other | Source: Ambulatory Visit | Attending: Radiation Oncology | Admitting: Radiation Oncology

## 2019-10-16 ENCOUNTER — Telehealth: Payer: Self-pay | Admitting: *Deleted

## 2019-10-16 NOTE — Telephone Encounter (Signed)
Called patient to inform that he doesn't need to come today, due to not having his CT scan, told patient that I would reschedule scan and fu visit, patient verified understanding this

## 2019-10-16 NOTE — Telephone Encounter (Signed)
CALLED PATIENT TO INFORM OF CT FOR 10-21-19- ARRIVAL TIME - 11:45 AM @ [facility], NO RESTRICTIONS TO TEST, PATIENT TO GET RESULTS FROM DR. KINARD ON 10-23-19 @ 1:45 PM, SPOKE WITH PATIENT AND HE VERIFIED UNDERSTANDING THESE APPTS.

## 2019-10-20 DIAGNOSIS — D519 Vitamin B12 deficiency anemia, unspecified: Secondary | ICD-10-CM | POA: Diagnosis not present

## 2019-10-21 ENCOUNTER — Ambulatory Visit (HOSPITAL_COMMUNITY)
Admission: RE | Admit: 2019-10-21 | Discharge: 2019-10-21 | Disposition: A | Discharge status: Home / Self Care | Payer: Medicare Other | Source: Ambulatory Visit | Attending: Pulmonary Disease | Admitting: Pulmonary Disease

## 2019-10-21 ENCOUNTER — Other Ambulatory Visit: Payer: Self-pay

## 2019-10-21 DIAGNOSIS — J8410 Pulmonary fibrosis, unspecified: Secondary | ICD-10-CM | POA: Diagnosis not present

## 2019-10-21 DIAGNOSIS — J90 Pleural effusion, not elsewhere classified: Secondary | ICD-10-CM | POA: Diagnosis not present

## 2019-10-21 DIAGNOSIS — I700 Atherosclerosis of aorta: Secondary | ICD-10-CM | POA: Diagnosis not present

## 2019-10-21 DIAGNOSIS — I2510 Atherosclerotic heart disease of native coronary artery without angina pectoris: Secondary | ICD-10-CM | POA: Diagnosis not present

## 2019-10-21 DIAGNOSIS — R918 Other nonspecific abnormal finding of lung field: Secondary | ICD-10-CM | POA: Diagnosis not present

## 2019-10-23 ENCOUNTER — Other Ambulatory Visit: Payer: Self-pay

## 2019-10-23 ENCOUNTER — Encounter: Payer: Self-pay | Admitting: Radiation Oncology

## 2019-10-23 ENCOUNTER — Ambulatory Visit
Admission: RE | Admit: 2019-10-23 | Discharge: 2019-10-23 | Disposition: A | Discharge status: Home / Self Care | Payer: Medicare Other | Source: Ambulatory Visit | Attending: Radiation Oncology | Admitting: Radiation Oncology

## 2019-10-23 VITALS — BP 195/67 | HR 56 | Temp 98.7°F | Resp 18 | Ht 71.0 in | Wt 197.2 lb

## 2019-10-23 DIAGNOSIS — R918 Other nonspecific abnormal finding of lung field: Secondary | ICD-10-CM | POA: Insufficient documentation

## 2019-10-23 DIAGNOSIS — Z923 Personal history of irradiation: Secondary | ICD-10-CM | POA: Diagnosis not present

## 2019-10-23 DIAGNOSIS — I2510 Atherosclerotic heart disease of native coronary artery without angina pectoris: Secondary | ICD-10-CM | POA: Diagnosis not present

## 2019-10-23 DIAGNOSIS — J90 Pleural effusion, not elsewhere classified: Secondary | ICD-10-CM | POA: Insufficient documentation

## 2019-10-23 DIAGNOSIS — Z08 Encounter for follow-up examination after completed treatment for malignant neoplasm: Secondary | ICD-10-CM | POA: Diagnosis not present

## 2019-10-23 DIAGNOSIS — I700 Atherosclerosis of aorta: Secondary | ICD-10-CM | POA: Insufficient documentation

## 2019-10-23 DIAGNOSIS — K449 Diaphragmatic hernia without obstruction or gangrene: Secondary | ICD-10-CM | POA: Insufficient documentation

## 2019-10-23 DIAGNOSIS — R05 Cough: Secondary | ICD-10-CM | POA: Diagnosis not present

## 2019-10-23 DIAGNOSIS — Z79899 Other long term (current) drug therapy: Secondary | ICD-10-CM | POA: Diagnosis not present

## 2019-10-23 DIAGNOSIS — C3411 Malignant neoplasm of upper lobe, right bronchus or lung: Secondary | ICD-10-CM | POA: Diagnosis not present

## 2019-10-23 NOTE — Progress Notes (Signed)
Pt presents today for f/u with Dr. Sondra Come. Pt reports some tenderness in LEFT arm from covid 19 vaccine yesterday. Pt reports having bronchitis. Pt reports cough with white sputum. Pt denies hemoptysis. Pt denies difficulty swallowing. Pt denies SOB.   BP (!) 195/67 (BP Location: Left Arm, Patient Position: Sitting)   Pulse (!) 56   Temp 98.7 F (37.1 C) (Temporal)   Resp 18   Ht 5\' 11"  (1.803 m)   Wt 197 lb 4 oz (89.5 kg)   SpO2 95%   BMI 27.51 kg/m   Wt Readings from Last 3 Encounters:  10/23/19 197 lb 4 oz (89.5 kg)  08/18/19 201 lb (91.2 kg)  07/14/19 201 lb (91.2 kg)   Loma Sousa, RN BSN

## 2019-10-23 NOTE — Progress Notes (Signed)
Radiation Oncology         [phone] ________________________________  Name: Christopher Rocha MRN: [id number]  Date: 10/23/2019  DOB: 02-Nov-1940  Follow-Up Visit Note  CC: [name], MD  [name], MD    ICD-10-CM   1. 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 Chest CT performed on 10/21/2019 showed: large mass-like area in RUL, increased in size since prior study and suspicious for residual disease; new small right pleural effusion lying dependently.  On review of systems, he reports having bronchitis, with a productive cough with white sputum, which he treats with over-the-counter Mucinex. he denies hemoptysis, difficulty swallowing, and shortness of breath. He also notes some tenderness in his left arm from receiving the Covid-19 vaccine yesterday.  ALLERGIES:  is allergic to penicillins.  Meds: Current Outpatient Medications  Medication Sig Dispense Refill  . acetaminophen (TYLENOL) 500 MG tablet Take 500 mg by mouth every 8 (eight) hours as needed (pain).    Marland Kitchen aspirin EC 81 MG tablet Take 81 mg by mouth every other day.     . budesonide-formoterol (SYMBICORT) 160-4.5 MCG/ACT inhaler Inhale 2 puffs into the lungs 2 (two) times daily. (Patient taking differently: Inhale 2 puffs into the lungs 2 (two) times daily as needed (asthma). ) 1 Inhaler 5  . losartan-hydrochlorothiazide (HYZAAR) 100-12.5 MG tablet Take 1 tablet by mouth daily.    . metoprolol tartrate (LOPRESSOR) 25 MG tablet Take 1 tablet (25 mg total) by mouth 2 (two) times daily. 60 tablet 1  . Naphazoline-Glycerin (REDNESS RELIEF OP) Place 1  drop into both eyes daily as needed (for redness/itching).    Marland Kitchen PROAIR HFA 108 (90 Base) MCG/ACT inhaler Inhale 2 puffs into the lungs every 4 (four) hours as needed for shortness of breath.    . rosuvastatin (CRESTOR) 10 MG tablet Take 1 tablet (10 mg total) by mouth daily. 90 tablet 3   No current facility-administered medications for this encounter.    Physical Findings: The patient is in no acute distress. Patient is alert and oriented. Lungs are clear to auscultation bilaterally. Heart has regular rate and rhythm. No palpable cervical, supraclavicular, or axillary adenopathy. Abdomen soft, non-tender, normal bowel sounds.   height is 5\' 11"  (1.803 m) and weight is 197 lb 4 oz (89.5 kg). His temporal temperature is 98.7 F (37.1 C). His blood pressure is 195/67 (abnormal) and his pulse is 56 (abnormal). His respiration is 18 and oxygen saturation is 95%. .    Lab Findings: Lab Results  Component Value Date   WBC 9.3 08/19/2019   HGB 11.6 (L) 08/19/2019   HCT 34.4 (L) 08/19/2019   MCV 90 08/19/2019   PLT 216 08/19/2019    Radiographic Findings: CT Chest Wo Contrast  Result Date: 10/21/2019 CLINICAL DATA:  79 year old male with history of lung cancer diagnosed in September 2020 status post radiation therapy which is now complete. Follow-up study. EXAM: CT CHEST WITHOUT CONTRAST TECHNIQUE: Multidetector CT imaging of the chest was performed following the standard protocol without IV contrast. COMPARISON:  Chest CT 03/20/2019. FINDINGS: Cardiovascular: Heart size is normal.  There is no significant pericardial fluid, thickening or pericardial calcification. There is aortic atherosclerosis, as well as atherosclerosis of the great vessels of the mediastinum and the coronary arteries, including calcified atherosclerotic plaque in the left main, left anterior descending, left circumflex and right coronary arteries. Status post median sternotomy for CABG including LIMA to the LAD. Status post  TAVR. Severe calcifications of the mitral annulus. Mediastinum/Nodes: No pathologically enlarged mediastinal or hilar lymph nodes. Please note that accurate exclusion of hilar adenopathy is limited on noncontrast CT scans. Moderate-sized hiatal hernia. No axillary lymphadenopathy. Lungs/Pleura: There is a large mass-like area with convex margins in the posterior aspect of the right upper lobe (axial image 29 of series 7) which is suspicious for residual neoplasm. Surrounding areas of architectural distortion in the right upper lobe as well as the adjacent superior segment of the right lower lobe likely reflect evolving postradiation changes. Small right pleural effusion lying dependently. No left pleural effusion. A few scattered tiny 2-4 mm pulmonary nodules are noted elsewhere in the lungs bilaterally, nonspecific. Upper Abdomen: Aortic atherosclerosis. Low-attenuation lesions in both kidneys measuring up to 3.1 cm in the upper pole the left kidney, incompletely characterized on today's non-contrast CT examination, but statistically likely to represent cysts. Musculoskeletal: There are no aggressive appearing lytic or blastic lesions noted in the visualized portions of the skeleton. Median sternotomy wires. IMPRESSION: 1. Large mass-like area in the right upper lobe. Although this could simply reflect postradiation mass-like fibrosis, given the increased in size compared to the prior study and the convex margins of the lesion, this is suspicious for residual disease. If there is clinical concern for residual/recurrent disease, further evaluation with PET-CT could be considered. Alternatively, close attention on short-term follow-up studies is recommended. 2. New small right pleural effusion lying dependently. 3. Aortic atherosclerosis, in addition to left main and 3 vessel coronary artery disease. Status post median sternotomy for CABG including LIMA to the LAD. 4. There are calcifications of the mitral annulus.  Echocardiographic correlation for evaluation of potential valvular dysfunction may be warranted if clinically indicated. 5. Status post TAVR. Aortic Atherosclerosis (ICD10-I70.0). Electronically Signed   By: Vinnie Langton M.D.   On: 10/21/2019 16:16    Impression: The patient tolerated his stereotactic body radiation therapy quite well without any significant side effects.  Patient underwent a recent chest CT scan for follow-up which shows masslike area in the right upper lobe likely reflecting post radiation therapy changes versus progressive disease.  I discussed the chest CT findings in detail with the patient.  I also reviewed the patient's chest CT scan as it relates to his stereotactic body radiation therapy.  I recommended he either proceed with an additional chest CT scan in 3 months or to proceed with a PET scan at this time for further evaluation.  Plan: He will be scheduled for chest CT scan in 3 months and then follow-up soon afterward.  He will meet with Dr. Elsworth Soho next week and I recommended he discuss whether to proceed with PET CT scan at this time or chest CT scan in 3 months.  I have ordered a chest CT scan in 3 months and will see him soon afterward for review unless a PET scan is ordered next week.  ____________________________________ Gery Pray, MD   Total time spent in this encounter was 25 minutes which included review of patient's previous radiation fields, his current chest CT scan, physical exam and documentation.  This document serves as a record of services  personally performed by Gery Pray, MD. It was created on his behalf by Wilburn Mylar, a trained medical scribe. The creation of this record is based on the scribe's personal observations and the provider's statements to them. This document has been checked and approved by the attending provider.

## 2019-10-23 NOTE — Patient Instructions (Signed)
Coronavirus (COVID-19) Are you at risk?  Are you at risk for the Coronavirus (COVID-19)?  To be considered HIGH RISK for Coronavirus (COVID-19), you have to meet the following criteria:  . Traveled to [place], [place], [place], [place] or [place]; or in the [place] to [place], [place], [place], or [place]; and have fever, cough, and shortness of breath within the last 2 weeks of travel OR . Been in close contact with a person diagnosed with COVID-19 within the last 2 weeks and have fever, cough, and shortness of breath . IF YOU DO NOT MEET THESE CRITERIA, YOU ARE CONSIDERED LOW RISK FOR COVID-19.  What to do if you are HIGH RISK for COVID-19?  . If you are having a medical emergency, call 911. . Seek medical care right away. Before you go to a doctor's office, urgent care or emergency department, call ahead and tell them about your recent travel, contact with someone diagnosed with COVID-19, and your symptoms. You should receive instructions from your physician's office regarding next steps of care.  . When you arrive at healthcare provider, tell the healthcare staff immediately you have returned from visiting [place], [place], [place], [place] or [place]; or traveled in the [place] to [place], [place], [place], or [place]; in the last two weeks or you have been in close contact with a person diagnosed with COVID-19 in the last 2 weeks.   . Tell the health care staff about your symptoms: fever, cough and shortness of breath. . After you have been seen by a medical provider, you will be either: o Tested for (COVID-19) and discharged home on quarantine except to seek medical care if symptoms worsen, and asked to  - Stay home and avoid contact with others until you get your results (4-5 days)  - Avoid travel on public transportation if possible (such as bus, train, or airplane) or o Sent to the Emergency Department by EMS for evaluation, COVID-19 testing, and possible  admission depending on your condition and test results.  What to do if you are LOW RISK for COVID-19?  Reduce your risk of any infection by using the same precautions used for avoiding the common cold or flu:  . Wash your hands often with soap and warm water for at least 20 seconds.  If soap and water are not readily available, use an alcohol-based hand sanitizer with at least 60% alcohol.  . If coughing or sneezing, cover your mouth and nose by coughing or sneezing into the elbow areas of your shirt or coat, into a tissue or into your sleeve (not your hands). . Avoid shaking hands with others and consider head nods or verbal greetings only. . Avoid touching your eyes, nose, or mouth with unwashed hands.  . Avoid close contact with people who are sick. . Avoid places or events with large numbers of people in one location, like concerts or sporting events. . Carefully consider travel plans you have or are making. . If you are planning any travel outside or inside the [place], visit the CDC's Travelers' Health webpage for the latest health notices. . If you have some symptoms but not all symptoms, continue to monitor at home and seek medical attention if your symptoms worsen. . If you are having a medical emergency, call 911.   ADDITIONAL HEALTHCARE OPTIONS FOR PATIENTS  [facility] / e-Visit: [email or web address]         [facility]: [phone]  [facility]: [phone]                   [facility]: [phone]   

## 2019-10-27 ENCOUNTER — Other Ambulatory Visit: Payer: Self-pay

## 2019-10-27 ENCOUNTER — Encounter: Payer: Self-pay | Admitting: Pulmonary Disease

## 2019-10-27 ENCOUNTER — Ambulatory Visit: Payer: Medicare Other | Admitting: Pulmonary Disease

## 2019-10-27 DIAGNOSIS — J411 Mucopurulent chronic bronchitis: Secondary | ICD-10-CM | POA: Diagnosis not present

## 2019-10-27 DIAGNOSIS — I10 Essential (primary) hypertension: Secondary | ICD-10-CM | POA: Diagnosis not present

## 2019-10-27 DIAGNOSIS — R911 Solitary pulmonary nodule: Secondary | ICD-10-CM

## 2019-10-27 DIAGNOSIS — C3491 Malignant neoplasm of unspecified part of right bronchus or lung: Secondary | ICD-10-CM

## 2019-10-27 HISTORY — DX: Malignant neoplasm of unspecified part of right bronchus or lung: C34.91

## 2019-10-27 MED ORDER — PREDNISONE 10 MG PO TABS: ORAL_TABLET | ORAL | 0 refills | Status: DC

## 2019-10-27 MED ORDER — BUDESONIDE-FORMOTEROL FUMARATE 160-4.5 MCG/ACT IN AERO: 2.0000 | INHALATION_SPRAY | Freq: Two times a day (BID) | RESPIRATORY_TRACT | 5 refills | Status: DC

## 2019-10-27 NOTE — Assessment & Plan Note (Signed)
Continue Symbicort twice daily Refills will be sent

## 2019-10-27 NOTE — Assessment & Plan Note (Addendum)
Slight increase in size of right upper lobe infiltrate post radiation -differential diagnosis includes radiation pneumonitis and recurrent cancer.  Small right pleural effusion is concerning, too small to tap currently  Schedule PET scan in 3 months -May 2021  Prednisone 10 mg tabs  Take 2 tabs daily with food x 5ds, then 1 tab daily with food x 5ds then STOP Call me for antibiotic if phlegm turns yellow/green  Copy of note to Dr. Lewis-oncology/[place] .  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 Sondra Come, radiation oncology

## 2019-10-27 NOTE — Assessment & Plan Note (Addendum)
Blood pressure high today, repeat was also high.  He states that this is due to being in the doctor's office He has antihypertensives which he is compliant with, denies headache nausea vomiting

## 2019-10-27 NOTE — Progress Notes (Signed)
   Subjective:    Patient ID: Christopher Rocha, male    DOB: 09/25/1940, 79 y.o.   MRN: [id number]  HPI  79 yo remote smoker for FU ofright upper lobe adenocarcinoma  This presented as a nonresolving right upper lobe lung infiltrate .  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 He smoked 2 packs/day until age 12 when he quit, more than 60 pack years.   05/2019 CT biopsy of right upper lobe showed adenocarcinoma He underwent radiation to right upper lobe CT chest 10/21/2019 post radiation was reviewed , shows a large masslike area in the right upper lobe which is increased in size compared to prior with convex margins, small right effusion was noted  He reports a wet cough, white sputum.  Breathing is stable but he is not using his Symbicort daily. No wheezing or pedal edema.  He is otherwise feeling well.  Got a second Covid shot  Is followed by [name] in [place] for myelodysplasia  Significant tests/ events reviewed  05/2018 bscopy >. BAL - pseudomonas, TBBXneg  CT chest 08/06/2018 -right upper lobe mass has decreased slightly from 5.3 x 4.7 x 4.1 cm to 4.9 x 4.5 x 3.4 cm, left lower lobe 2 cm density pleural-based seems to persist  PET 88/41/66 >>hypermetabolic RUL &LLL PET 0/6301 >> hypermetabolic right upper lobe infiltrate, no other areas slighted up  CT chest 10/2018 left lower lobe opacity has resolved, right upper lobe persists with airspace consolidation  PFTs 05/2018 ratio 54, FEV1 of 60%, FVC of 72% consistent with moderate airway obstruction, TLC 97%, DLCO 53%  Review of Systems neg for any significant sore throat, dysphagia, itching, sneezing, nasal congestion or excess/ purulent secretions, fever, chills, sweats, unintended wt loss, pleuritic or  exertional cp, hempoptysis, orthopnea pnd or change in chronic leg swelling. Also denies presyncope, palpitations, heartburn, abdominal pain, nausea, vomiting, diarrhea or change in bowel or urinary habits, dysuria,hematuria, rash, arthralgias, visual complaints, headache, numbness weakness or ataxia.     Objective:   Physical Exam  Gen. Pleasant, well-nourished, in no distress ENT - no thrush, no pallor/icterus,no post nasal drip Neck: No JVD, no thyromegaly, no carotid bruits Lungs: no use of accessory muscles, no dullness to percussion, clear without rales or rhonchi  Cardiovascular: Rhythm regular, heart sounds  normal, no murmurs or gallops, no peripheral edema Musculoskeletal: No deformities, no cyanosis or clubbing        Assessment & Plan:

## 2019-10-27 NOTE — Patient Instructions (Signed)
Schedule PET scan in 3 months -May 2021  Prednisone 10 mg tabs  Take 2 tabs daily with food x 5ds, then 1 tab daily with food x 5ds then STOP Call me for antibiotic if phlegm turns yellow/green  Repeat blood pressure today  Copy of note to Dr. Lewis-oncology/[place]

## 2019-11-17 DIAGNOSIS — E538 Deficiency of other specified B group vitamins: Secondary | ICD-10-CM | POA: Diagnosis not present

## 2019-12-18 ENCOUNTER — Other Ambulatory Visit: Payer: Self-pay

## 2019-12-18 ENCOUNTER — Ambulatory Visit (INDEPENDENT_AMBULATORY_CARE_PROVIDER_SITE_OTHER): Payer: Medicare Other

## 2019-12-18 DIAGNOSIS — Z952 Presence of prosthetic heart valve: Secondary | ICD-10-CM

## 2019-12-18 NOTE — Progress Notes (Signed)
Complete echocardiogram has been performed.  Jimmy Yeray Tomas RDCS, RVT 

## 2019-12-19 DIAGNOSIS — D519 Vitamin B12 deficiency anemia, unspecified: Secondary | ICD-10-CM | POA: Diagnosis not present

## 2019-12-23 DIAGNOSIS — J42 Unspecified chronic bronchitis: Secondary | ICD-10-CM | POA: Diagnosis not present

## 2019-12-23 DIAGNOSIS — Z0000 Encounter for general adult medical examination without abnormal findings: Secondary | ICD-10-CM | POA: Diagnosis not present

## 2020-01-07 ENCOUNTER — Other Ambulatory Visit: Payer: Self-pay

## 2020-01-07 ENCOUNTER — Ambulatory Visit (HOSPITAL_COMMUNITY)
Admission: RE | Admit: 2020-01-07 | Discharge: 2020-01-07 | Disposition: A | Discharge status: Home / Self Care | Payer: Medicare Other | Source: Ambulatory Visit | Attending: Pulmonary Disease | Admitting: Pulmonary Disease

## 2020-01-07 DIAGNOSIS — R911 Solitary pulmonary nodule: Secondary | ICD-10-CM | POA: Insufficient documentation

## 2020-01-07 DIAGNOSIS — I700 Atherosclerosis of aorta: Secondary | ICD-10-CM | POA: Diagnosis not present

## 2020-01-07 DIAGNOSIS — R918 Other nonspecific abnormal finding of lung field: Secondary | ICD-10-CM | POA: Diagnosis not present

## 2020-01-07 DIAGNOSIS — C3490 Malignant neoplasm of unspecified part of unspecified bronchus or lung: Secondary | ICD-10-CM | POA: Diagnosis not present

## 2020-01-07 DIAGNOSIS — I517 Cardiomegaly: Secondary | ICD-10-CM | POA: Insufficient documentation

## 2020-01-07 DIAGNOSIS — J90 Pleural effusion, not elsewhere classified: Secondary | ICD-10-CM | POA: Diagnosis not present

## 2020-01-07 DIAGNOSIS — N200 Calculus of kidney: Secondary | ICD-10-CM | POA: Diagnosis not present

## 2020-01-07 MED ORDER — FLUDEOXYGLUCOSE F - 18 (FDG) INJECTION: 9.8000 | Freq: Once | INTRAVENOUS | Status: DC | PRN

## 2020-01-07 NOTE — Progress Notes (Signed)
Mr. Christopher Rocha presents today for follow-up after completing radiation to the right lung on 06/10/2019  Pain No Fatigue intermittent Cough yes, daily ? Chronic bronchitis productive cough of white sputum SOB occ Pain or diff swallowing No   Other notable issues, if any: Receiving results from PET scan on 01/07/2020  Vitals:   01/08/20 1224  BP: (!) 214/67  Pulse: 65  Temp: 98.5 F (36.9 C)  Height: 5\' 11"  (1.803 m)  Weight: 199 lb (90.3 kg)  SpO2: 96%  TempSrc: Temporal  BMI (Calculated): 27.77     Weight 199

## 2020-01-07 NOTE — Progress Notes (Signed)
Radiation Oncology         [phone] ________________________________  Name: Christopher Rocha MRN: [id number]  Date: 01/08/2020  DOB: Oct 21, 1940  Follow-Up Visit Note  CC: Christopher Hipps, MD  Christopher Pollack, MD    ICD-10-CM   1. Adenocarcinoma, lung, right (HCC)  C34.91 CT Chest W Contrast    BUN & Creatinine ([facility])    Diagnosis: Stage IIA (T2b, n0, m0) Adenocarcinoma presenting in the right upper lobe  Interval Since Last Radiation: Seven months  06/03/2019 through 06/10/2019 Site Technique Total Dose Dose per Fx Completed Fx Beam Energies  Thorax: Lung_Rt IMRT 54/54 18 3/3 6XFFF    Narrative:  The patient returns today for routine follow-up and to discuss PET scan from 01/07/2020. Results showed mild patchy hypermetabolism within collapse/consolidation in the right upper lobe, findings in keeping with evolutionary changes of radiation therapy. There was no focal abnormal hypermetabolism to suggest residual or recurrent disease and there was no evidence of metastatic disease.  On review of systems, he reports intermittent fatigue, occasional shortness of breath but does not seem to be slowing him down, and productive cough with white sputum attributed to chronic bronchitis. He denies pain/difficulty swallowing.  Prior to this follow-up appointment today he was doing yard work and staining his deck.  ALLERGIES:  is allergic to penicillins.  Meds: Current Outpatient Medications  Medication Sig Dispense Refill  . aspirin EC 81 MG tablet Take 81 mg by mouth every other day.     . budesonide-formoterol (SYMBICORT) 160-4.5 MCG/ACT inhaler Inhale 2 puffs into the lungs 2 (two) times daily. 1 Inhaler 5  . ibuprofen (ADVIL) 200 MG tablet Take 200 mg by mouth every 6 (six) hours as needed.    Marland Kitchen losartan-hydrochlorothiazide (HYZAAR) 100-12.5 MG tablet Take 1 tablet by mouth daily.    . Naphazoline-Glycerin (REDNESS RELIEF OP) Place 1 drop into both eyes daily as needed (for  redness/itching).    Marland Kitchen PROAIR HFA 108 (90 Base) MCG/ACT inhaler Inhale 2 puffs into the lungs every 4 (four) hours as needed for shortness of breath.    . rosuvastatin (CRESTOR) 10 MG tablet Take 10 mg by mouth daily.    Marland Kitchen acetaminophen (TYLENOL) 500 MG tablet Take 500 mg by mouth every 8 (eight) hours as needed (pain).    . rosuvastatin (CRESTOR) 10 MG tablet Take 1 tablet (10 mg total) by mouth daily. 90 tablet 3   No current facility-administered medications for this encounter.   Facility-Administered Medications Ordered in Other Encounters  Medication Dose Route Frequency Provider Last Rate Last Admin  . fludeoxyglucose F - 18 (FDG) injection 9.8 millicurie  9.8 millicurie Intravenous Once PRN Poff, Nicoletta Dress, MD        Physical Findings: The patient is in no acute distress. Patient is alert and oriented. Lungs are clear to auscultation bilaterally. Heart has regular rate and rhythm. No palpable cervical, supraclavicular, or axillary adenopathy. Abdomen soft, non-tender, normal bowel sounds.  Occasional wheeze/squeaking in the right lung field   height is 5\' 11"  (1.803 m) and weight is 199 lb (90.3 kg). His temporal temperature is 98.5 F (36.9 C). His blood pressure is 214/67 (abnormal) and his pulse is 65. His oxygen saturation is 96%.   Lab Findings: Lab Results  Component Value Date   WBC 9.3 08/19/2019   HGB 11.6 (L) 08/19/2019   HCT 34.4 (L) 08/19/2019   MCV 90 08/19/2019   PLT 216 08/19/2019    Radiographic Findings: NM PET Image  Restag (PS) Skull Base To Thigh  Result Date: 01/07/2020 CLINICAL DATA:  Subsequent treatment strategy for lung cancer with new lung nodule. EXAM: NUCLEAR MEDICINE PET SKULL BASE TO THIGH TECHNIQUE: 9.8 mCi F-18 FDG was injected intravenously. Full-ring PET imaging was performed from the skull base to thigh after the radiotracer. CT data was obtained and used for attenuation correction and anatomic localization. Fasting blood glucose: 121  mg/dl COMPARISON:  CT chest 10/21/2019 and PET 05/08/2019. FINDINGS: Mediastinal blood pool activity: SUV max 3.3 Liver activity: SUV max NA NECK: No abnormal hypermetabolism. Incidental CT findings: None. CHEST: Vague patchy mild hypermetabolism throughout combined collapse/consolidation in the right upper lobe, with an SUV max of 4.4, compared to 3.6 previously. No hypermetabolic mediastinal, hilar or axillary lymph nodes. Incidental CT findings: Atherosclerotic calcification of the aorta. Aortic valve replacement. Heart is at the upper limits of normal in size to mildly enlarged. No pericardial effusion. Trace right pleural fluid. ABDOMEN/PELVIS: No abnormal hypermetabolism in the liver, adrenal glands, spleen or pancreas. No hypermetabolic lymph nodes. Incidental CT findings: Liver, gallbladder and adrenal glands are unremarkable. Tiny stones in the right kidney. Renal vascular calcifications bilaterally. Low-attenuation lesions in the kidneys measure up to 9.0 cm on the left and are likely cysts although definitive characterization is limited due to size and/or lack of IV contrast. Spleen, pancreas, stomach and bowel are unremarkable with exception of a small hiatal hernia. Atherosclerotic calcification of the aorta without aneurysm. No free fluid. SKELETON: No abnormal osseous hypermetabolism. Uptake associated with the L5 spinous process does not have a CT correlate. Incidental CT findings: Degenerative changes in the spine. IMPRESSION: 1. Mild patchy hypermetabolism within collapse/consolidation in the right upper lobe, findings in keeping with evolutionary changes of radiation therapy, completed October 2020. No focal abnormal hypermetabolism to suggest residual or recurrent disease. 2. No evidence of metastatic disease. 3. Trace right pleural effusion. 4. Tiny right renal stones. 5.  Aortic atherosclerosis (ICD10-I70.0). Electronically Signed   By: Lorin Picket M.D.   On: 01/07/2020 13:43    ECHOCARDIOGRAM COMPLETE  Result Date: 12/18/2019    ECHOCARDIOGRAM REPORT   Patient Name:   Christopher Rocha Date of Exam: 12/18/2019 Medical Rec #:  [id number]            Height:       71.0 in Accession #:    [id number]           Weight:       196.4 lb Date of Birth:  1940/10/11            BSA:          2.092 m Patient Age:    56 years             BP:           180/100 mmHg Patient Gender: M                    HR:           57 bpm. Exam Location:  [facility] Procedure: 2D Echo Indications:    S/P TAVR (transcatheter aortic valve replacement) [Z95.2                 (ICD-10-CM)]  History:        Patient has prior history of Echocardiogram examinations, most                 recent 06/18/2001. CHF, CAD, Prior CABG; Risk  Factors:Hypertension and Dyslipidemia.  Sonographer:    Luane School Referring Phys: [id number] [place]  1. Left ventricular ejection fraction, by estimation, is 55 to 60%. The left ventricle has normal function. The left ventricle has no regional wall motion abnormalities. The left ventricular internal cavity size was mildly dilated. There is mild concentric left ventricular hypertrophy.  2. Left ventricular diastolic parameters are consistent with Grade II diastolic dysfunction (pseudonormalization).  3. Right ventricular systolic function is normal. The right ventricular size is normal.  4. Left atrial size was mildly dilated.  5. Well seated 29 mm Edwards Sapien bioprosthetic aortic valve. Aortic valve regurgitation is not visualized. No evidence of aortic stenosis is present.  6. There is severe posterior mitral annular calcification present. Mild mitral valve regurgitation. Mild mitral stenosis is present.  7. Unable to determine Pulmonary Artery Systolic Pressure, No TR spectral Jet display.  8. The inferior vena cava is normal in size with greater than 50% respiratory variability, suggesting right atrial pressure of 3 mmHg. FINDINGS  Left Ventricle: Post TAVR  with 29 mm Sapien 3 valve No PVL gradients have increased since 08/15/18 when mean was 13 and peak 27 mmHg. Left ventricular ejection fraction, by estimation, is 55 to 60%. The left ventricle has normal function. The left ventricle has no regional wall motion abnormalities. The left ventricular internal cavity size was mildly dilated. There is mild concentric left ventricular hypertrophy. Left ventricular diastolic parameters are consistent with Grade II diastolic dysfunction (pseudonormalization). Right Ventricle: The right ventricular size is normal. No increase in right ventricular wall thickness. Right ventricular systolic function is normal. Left Atrium: Left atrial size was mildly dilated. Right Atrium: Right atrial size was normal in size. Pericardium: There is no evidence of pericardial effusion. Mitral Valve: The mitral valve is degenerative in appearance. Normal mobility of the mitral valve leaflets. Severe mitral annular calcification. Mild mitral valve regurgitation. Mild mitral valve stenosis. MV peak gradient, 11.4 mmHg. The mean mitral valve gradient is 4.0 mmHg. Tricuspid Valve: The tricuspid valve is normal in structure. Tricuspid valve regurgitation is not demonstrated. No evidence of tricuspid stenosis. Aortic Valve: Well Seated 16mm Edwards Sapien bioprosthetic valve. The aortic valve has been repaired/replaced. Aortic valve regurgitation is not visualized. No aortic stenosis is present. Aortic valve mean gradient measures 13.0 mmHg. Aortic valve peak gradient measures 23.7 mmHg. Aortic valve area, by VTI measures 1.44 cm. There is a 29 mm Edwards valve present in the aortic position. Pulmonic Valve: The pulmonic valve was normal in structure. Pulmonic valve regurgitation is not visualized. No evidence of pulmonic stenosis. Aorta: The aortic root is normal in size and structure. Venous: The inferior vena cava is normal in size with greater than 50% respiratory variability, suggesting right  atrial pressure of 3 mmHg. IAS/Shunts: No atrial level shunt detected by color flow Doppler.  LEFT VENTRICLE PLAX 2D LVIDd:         6.30 cm  Diastology LVIDs:         4.30 cm  LV e' lateral:   5.43 cm/s LV PW:         1.20 cm  LV E/e' lateral: 29.3 LV IVS:        1.20 cm  LV e' medial:    3.00 cm/s LVOT diam:     2.00 cm  LV E/e' medial:  53.0 LV SV:         87 LV SV Index:   42 LVOT Area:     3.14 cm  RIGHT VENTRICLE            IVC RV S prime:     6.81 cm/s  IVC diam: 1.90 cm TAPSE (M-mode): 2.1 cm LEFT ATRIUM             Index       RIGHT ATRIUM           Index LA diam:        4.80 cm 2.29 cm/m  RA Area:     15.50 cm LA Vol (A2C):   73.9 ml 35.32 ml/m RA Volume:   41.00 ml  19.60 ml/m LA Vol (A4C):   69.6 ml 33.26 ml/m LA Biplane Vol: 74.3 ml 35.51 ml/m  AORTIC VALVE AV Area (Vmax):    1.42 cm AV Area (Vmean):   1.42 cm AV Area (VTI):     1.44 cm AV Vmax:           243.50 cm/s AV Vmean:          166.000 cm/s AV VTI:            0.605 m AV Peak Grad:      23.7 mmHg AV Mean Grad:      13.0 mmHg LVOT Vmax:         110.00 cm/s LVOT Vmean:        74.900 cm/s LVOT VTI:          0.278 m LVOT/AV VTI ratio: 0.46  AORTA Ao Root diam: 3.00 cm Ao Asc diam:  3.20 cm MITRAL VALVE MV Area (PHT): 2.24 cm     SHUNTS MV Peak grad:  11.4 mmHg    Systemic VTI:  0.28 m MV Mean grad:  4.0 mmHg     Systemic Diam: 2.00 cm MV Vmax:       1.69 m/s MV Vmean:      91.2 cm/s MV Decel Time: 338 msec MV E velocity: 159.00 cm/s MV A velocity: 103.00 cm/s MV E/A ratio:  1.54 Kardie Tobb DO Electronically signed by Berniece Salines DO Signature Date/Time: 12/18/2019/6:05:09 PM    Final     Impression: Stage IIA (T2b, n0, m0) Adenocarcinoma presenting in the right upper lobe  Recent  PET scan shows no obvious recurrence, progressive changes of radiation therapy.  Plan: Follow-up with radiation oncology in 3 months.  Prior to this appointment he will have a CT scan of the chest to assess for stability.  If this is stable then he will  likely proceed with every 6 month imaging. He will be meeting with Dr [name] down in [place] tomorrow.    ____________________________________   Blair Promise, PhD, MD  This document serves as a record of services personally performed by Gery Pray, MD. It was created on his behalf by Clerance Lav, a trained medical scribe. The creation of this record is based on the scribe's personal observations and the provider's statements to them. This document has been checked and approved by the attending provider.

## 2020-01-08 ENCOUNTER — Other Ambulatory Visit: Payer: Self-pay

## 2020-01-08 ENCOUNTER — Ambulatory Visit
Admission: RE | Admit: 2020-01-08 | Discharge: 2020-01-08 | Disposition: A | Discharge status: Home / Self Care | Payer: Medicare Other | Source: Ambulatory Visit | Attending: Radiation Oncology | Admitting: Radiation Oncology

## 2020-01-08 ENCOUNTER — Encounter: Payer: Self-pay | Admitting: Radiation Oncology

## 2020-01-08 VITALS — BP 214/67 | HR 65 | Temp 98.5°F | Ht 71.0 in | Wt 199.0 lb

## 2020-01-08 DIAGNOSIS — Z7951 Long term (current) use of inhaled steroids: Secondary | ICD-10-CM | POA: Insufficient documentation

## 2020-01-08 DIAGNOSIS — I340 Nonrheumatic mitral (valve) insufficiency: Secondary | ICD-10-CM | POA: Diagnosis not present

## 2020-01-08 DIAGNOSIS — C3411 Malignant neoplasm of upper lobe, right bronchus or lung: Secondary | ICD-10-CM | POA: Diagnosis not present

## 2020-01-08 DIAGNOSIS — Z923 Personal history of irradiation: Secondary | ICD-10-CM | POA: Diagnosis not present

## 2020-01-08 DIAGNOSIS — I700 Atherosclerosis of aorta: Secondary | ICD-10-CM | POA: Insufficient documentation

## 2020-01-08 DIAGNOSIS — J90 Pleural effusion, not elsewhere classified: Secondary | ICD-10-CM | POA: Diagnosis not present

## 2020-01-08 DIAGNOSIS — E785 Hyperlipidemia, unspecified: Secondary | ICD-10-CM | POA: Insufficient documentation

## 2020-01-08 DIAGNOSIS — N200 Calculus of kidney: Secondary | ICD-10-CM | POA: Diagnosis not present

## 2020-01-08 DIAGNOSIS — R5383 Other fatigue: Secondary | ICD-10-CM | POA: Insufficient documentation

## 2020-01-08 DIAGNOSIS — C3491 Malignant neoplasm of unspecified part of right bronchus or lung: Secondary | ICD-10-CM

## 2020-01-08 DIAGNOSIS — I110 Hypertensive heart disease with heart failure: Secondary | ICD-10-CM | POA: Diagnosis not present

## 2020-01-08 DIAGNOSIS — Z7982 Long term (current) use of aspirin: Secondary | ICD-10-CM | POA: Diagnosis not present

## 2020-01-08 DIAGNOSIS — Z79899 Other long term (current) drug therapy: Secondary | ICD-10-CM | POA: Diagnosis not present

## 2020-01-08 DIAGNOSIS — Z08 Encounter for follow-up examination after completed treatment for malignant neoplasm: Secondary | ICD-10-CM | POA: Diagnosis not present

## 2020-01-08 LAB — GLUCOSE, CAPILLARY: Glucose-Capillary: 121 mg/dL — ABNORMAL HIGH (ref 70–99)

## 2020-01-12 DIAGNOSIS — Z862 Personal history of diseases of the blood and blood-forming organs and certain disorders involving the immune mechanism: Secondary | ICD-10-CM | POA: Diagnosis not present

## 2020-01-12 DIAGNOSIS — I700 Atherosclerosis of aorta: Secondary | ICD-10-CM | POA: Diagnosis not present

## 2020-01-12 DIAGNOSIS — R05 Cough: Secondary | ICD-10-CM | POA: Diagnosis not present

## 2020-01-12 DIAGNOSIS — Z0001 Encounter for general adult medical examination with abnormal findings: Secondary | ICD-10-CM | POA: Diagnosis not present

## 2020-01-12 DIAGNOSIS — J701 Chronic and other pulmonary manifestations due to radiation: Secondary | ICD-10-CM | POA: Diagnosis not present

## 2020-01-12 DIAGNOSIS — Z85118 Personal history of other malignant neoplasm of bronchus and lung: Secondary | ICD-10-CM | POA: Diagnosis not present

## 2020-01-22 ENCOUNTER — Ambulatory Visit: Payer: Self-pay | Admitting: Radiation Oncology

## 2020-01-26 ENCOUNTER — Ambulatory Visit (HOSPITAL_COMMUNITY)
Admission: RE | Admit: 2020-01-26 | Discharge: 2020-01-26 | Disposition: A | Discharge status: Home / Self Care | Payer: Medicare Other | Source: Ambulatory Visit | Attending: Radiation Oncology | Admitting: Radiation Oncology

## 2020-01-26 ENCOUNTER — Ambulatory Visit (HOSPITAL_COMMUNITY): Payer: Medicare Other

## 2020-01-26 ENCOUNTER — Other Ambulatory Visit: Payer: Self-pay

## 2020-01-26 DIAGNOSIS — R918 Other nonspecific abnormal finding of lung field: Secondary | ICD-10-CM | POA: Insufficient documentation

## 2020-01-26 DIAGNOSIS — C3490 Malignant neoplasm of unspecified part of unspecified bronchus or lung: Secondary | ICD-10-CM | POA: Diagnosis not present

## 2020-02-16 ENCOUNTER — Ambulatory Visit: Payer: Medicare Other | Admitting: Pulmonary Disease

## 2020-02-16 ENCOUNTER — Encounter: Payer: Self-pay | Admitting: Pulmonary Disease

## 2020-02-16 ENCOUNTER — Other Ambulatory Visit: Payer: Self-pay

## 2020-02-16 DIAGNOSIS — C3491 Malignant neoplasm of unspecified part of right bronchus or lung: Secondary | ICD-10-CM | POA: Diagnosis not present

## 2020-02-16 DIAGNOSIS — I10 Essential (primary) hypertension: Secondary | ICD-10-CM

## 2020-02-16 DIAGNOSIS — J411 Mucopurulent chronic bronchitis: Secondary | ICD-10-CM | POA: Diagnosis not present

## 2020-02-16 MED ORDER — BREZTRI AEROSPHERE 160-9-4.8 MCG/ACT IN AERO: 2.0000 | INHALATION_SPRAY | Freq: Two times a day (BID) | RESPIRATORY_TRACT | 0 refills | Status: DC

## 2020-02-16 NOTE — Addendum Note (Signed)
Addended by: Luanna Salk on: 02/16/2020 11:19 AM   Modules accepted: Orders

## 2020-02-16 NOTE — Patient Instructions (Addendum)
Recheck BP  Trial of BREZTRI twice daily instead of symbicort - call for Rx if this works  Follow up CT per dr Sondra Come

## 2020-02-16 NOTE — Assessment & Plan Note (Signed)
Trial of breztri instead of  Symbicort. He will call us back if this works

## 2020-02-16 NOTE — Progress Notes (Signed)
° °  Subjective:    Patient ID: Christopher Rocha, male    DOB: Oct 28, 1940, 79 y.o.   MRN: [id number]  HPI  63 yoremote smoker for FU ofright upper lobe adenocarcinoma  This presented as a nonresolving right upper lobe lung infiltrate .  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  05/2019 CT biopsy of right upper lobe showed adenocarcinoma He underwent radiation to right upper lobe  PMH - emergent CABG in 2014, TAVR forsevere aortic stenosis,  refractory anemia s/p bone marrow biopsy on 9/2068frmyelodysplastic syndrome. He smoked 2 packs/day until age [age]when he quit, more than 60 pack years.   He complains of rattling and congestion occasionally.  Breathing is okay. He has figured out that lying on his right side helps clear his sputum We reviewed CT and PET scan from 01/2020 which show slight decrease in size of right upper lobe masslike lesion  Blood pressure very high today "it is very high in doctor's office". Denies nausea, vomiting, blurred vision  Significant tests/ events reviewed  CT Chest 01/2020 Slight interval decrease in size of masslike consolidation, architectural distortion, and volume loss of the right upper lobe, largest component measuring approximately 6.2 x 4.9 cm in axial dimension, previously 6.9 x 5.4 cm  -Unchanged small clustered, irregular nodules at the left lung base,  9/2019bscopy >. BAL - pseudomonas, TBBXneg  CT chest 08/06/2018 -right upper lobe mass has decreased slightly from 5.3 x 4.7 x 4.1 cm to 4.9 x 4.5 x 3.4 cm, left lower lobe 2 cm density pleural-based seems to persist  PET 145/62/56>>hypermetabolic RUL &LLL PET 93/8937>> hypermetabolic right upper lobe infiltrate, no other areas slighted up  CT chest2/2020left lower lobe opacity has resolved, right upper lobe persists with airspace consolidation  PFTs 05/2018 ratio 54, FEV1 of 60%, FVC of 72% consistent with  moderate airway obstruction, TLC 97%, DLCO 53%  Review of Systems Patient denies significant dyspnea,cough, hemoptysis,  chest pain, palpitations, pedal edema, orthopnea, paroxysmal nocturnal dyspnea, lightheadedness, nausea, vomiting, abdominal or  leg pains      Objective:   Physical Exam  Gen. Pleasant, well-nourished, in no distress ENT - no thrush, no pallor/icterus,no post nasal drip Neck: No JVD, no thyromegaly, no carotid bruits Lungs: no use of accessory muscles, no dullness to percussion,RT scattered rhonchi Cardiovascular: Rhythm regular, heart sounds  normal, no murmurs or gallops, no peripheral edema Musculoskeletal: No deformities, no cyanosis or clubbing         Assessment & Plan:

## 2020-02-16 NOTE — Assessment & Plan Note (Signed)
Blood pressure is extremely high but he is asymptomatic. He has undergone 30-day ambulatory monitoring which was normal. Continue losartan

## 2020-02-16 NOTE — Assessment & Plan Note (Signed)
Slight decrease in size which is encouraging.  No evidence of recurrent malignancy on PET scan. Follow-up scan has been scheduled for 3 months. Mild radiation pneumonitis but does not need more steroids

## 2020-02-17 ENCOUNTER — Encounter: Payer: Self-pay | Admitting: Cardiology

## 2020-02-17 ENCOUNTER — Ambulatory Visit (INDEPENDENT_AMBULATORY_CARE_PROVIDER_SITE_OTHER): Payer: Medicare Other | Admitting: Cardiology

## 2020-02-17 VITALS — BP 190/72 | HR 50 | Ht 71.0 in | Wt 199.0 lb

## 2020-02-17 DIAGNOSIS — I10 Essential (primary) hypertension: Secondary | ICD-10-CM | POA: Diagnosis not present

## 2020-02-17 DIAGNOSIS — Z951 Presence of aortocoronary bypass graft: Secondary | ICD-10-CM

## 2020-02-17 DIAGNOSIS — Z952 Presence of prosthetic heart valve: Secondary | ICD-10-CM

## 2020-02-17 DIAGNOSIS — E782 Mixed hyperlipidemia: Secondary | ICD-10-CM | POA: Diagnosis not present

## 2020-02-17 DIAGNOSIS — I2510 Atherosclerotic heart disease of native coronary artery without angina pectoris: Secondary | ICD-10-CM

## 2020-02-17 DIAGNOSIS — C3491 Malignant neoplasm of unspecified part of right bronchus or lung: Secondary | ICD-10-CM | POA: Diagnosis not present

## 2020-02-17 NOTE — Progress Notes (Signed)
Cardiology Office Note:    Date:  02/17/2020   ID:  Carry Ortez Tioga, DOB 02/05/1941, MRN [id number]  PCP:  [name], MD  Cardiologist:  Jenean Lindau, MD   Referring MD: [name], MD    ASSESSMENT:    1. Hx of CABG   2. S/P TAVR (transcatheter aortic valve replacement)   3. Mixed dyslipidemia   4. Adenocarcinoma, lung, right ([place])   5. Essential hypertension   6. Coronary artery disease involving native coronary artery of native heart without angina pectoris    PLAN:    In order of problems listed above:  1. Coronary artery disease: Secondary prevention stressed with the patient.  Importance of compliance with diet medication stressed and he vocalized understanding.  He walks on a regular basis. 2. S/p TAVR surgery: Patient is stable from this perspective 3. History of cancer of the lung: As mentioned below he has been told by his physicians that he is cancer free at this time 4. Mixed dyslipidemia: Diet was emphasized.  He mentions to me that lipids checked by primary care physician and was fine 5. Patient will be seen in follow-up appointment in 6 months or earlier if the patient has any concerns    Medication Adjustments/Labs and Tests Ordered: Current medicines are reviewed at length with the patient today.  Concerns regarding medicines are outlined above.  No orders of the defined types were placed in this encounter.  No orders of the defined types were placed in this encounter.    Chief Complaint  Patient presents with  . Follow-up     History of Present Illness:    Christopher Rocha is a 79 y.o. male.  Patient has past medical history of coronary artery disease post TAVR surgery and CABG.  He was diagnosed to have lung cancer and he was told yesterday by his physicians that he was cancer free at this time.  No chest pain orthopnea or PND.  He takes care of activities of daily living without any problems.  At the time of my evaluation, the  patient is alert awake oriented and in no distress.  Past Medical History:  Diagnosis Date  . Anemia    iron deficiency   . Coronary artery disease   . HLD (hyperlipidemia)   . HTN (hypertension)   . Pulmonary nodule   . S/P CABG (coronary artery bypass graft)   . S/P TAVR (transcatheter aortic valve replacement)    Edwards Sapien XT THV (size 29 mm, model # 9300TFX, serial # [id number])  . Severe aortic stenosis     Past Surgical History:  Procedure Laterality Date  . BONE MARROW ASPIRATION    . CARDIAC CATHETERIZATION    . CARDIAC SURGERY    . COLONOSCOPY    . CORONARY ARTERY BYPASS GRAFT    . EYE SURGERY     bilateral cataracts  . FIBEROPTIC BRONCHOSCOPY  04/08/2019      . RIGHT/LEFT HEART CATH AND CORONARY/GRAFT ANGIOGRAPHY N/A 05/08/2018   Procedure: RIGHT/LEFT HEART CATH AND CORONARY/GRAFT ANGIOGRAPHY;  Surgeon: Wellington Hampshire, MD;  Location: [facility];  Service: Cardiovascular;  Laterality: N/A;  . TEE WITHOUT CARDIOVERSION  07/02/2018   Procedure: TRANSESOPHAGEAL ECHOCARDIOGRAM (TEE);  Surgeon: Sherren Mocha, MD;  Location: [facility];  Service: Open Heart Surgery;;  . TRANSCATHETER AORTIC VALVE REPLACEMENT, TRANSAPICAL N/A 07/02/2018   Procedure: TRANSCATHETER AORTIC VALVE REPLACEMENT, TRANSAPICAL;  Surgeon: Sherren Mocha, MD;  Location: [facility];  Service: Open  Heart Surgery;  Laterality: N/A;  . VIDEO BRONCHOSCOPY Bilateral 05/31/2018   Procedure: VIDEO BRONCHOSCOPY WITH FLUORO;  Surgeon: Rigoberto Noel, MD;  Location: [facility];  Service: Cardiopulmonary;  Laterality: Bilateral;  . VIDEO BRONCHOSCOPY WITH ENDOBRONCHIAL NAVIGATION N/A 04/08/2019   Procedure: VIDEO BRONCHOSCOPY WITH ENDOBRONCHIAL NAVIGATION;  Surgeon: Rigoberto Noel, MD;  Location: [facility];  Service: Thoracic;  Laterality: N/A;  . VIDEO BRONCHOSCOPY WITH ENDOBRONCHIAL ULTRASOUND N/A 04/08/2019   Procedure: Video Bronchoscopy With Endobronchial Ultrasound;  Surgeon: Rigoberto Noel, MD;  Location: [facility];   Service: Thoracic;  Laterality: N/A;  . WISDOM TOOTH EXTRACTION      Current Medications: Current Meds  Medication Sig  . acetaminophen (TYLENOL) 500 MG tablet Take 500 mg by mouth every 8 (eight) hours as needed (pain).  Marland Kitchen aspirin EC 81 MG tablet Take 81 mg by mouth every other day.   . Budeson-Glycopyrrol-Formoterol (BREZTRI AEROSPHERE) 160-9-4.8 MCG/ACT AERO Inhale 2 puffs into the lungs 2 (two) times daily.  . budesonide-formoterol (SYMBICORT) 160-4.5 MCG/ACT inhaler Inhale 2 puffs into the lungs 2 (two) times daily.  Marland Kitchen ibuprofen (ADVIL) 200 MG tablet Take 200 mg by mouth every 6 (six) hours as needed.  Marland Kitchen losartan-hydrochlorothiazide (HYZAAR) 100-12.5 MG tablet Take 1 tablet by mouth daily.  . Naphazoline-Glycerin (REDNESS RELIEF OP) Place 1 drop into both eyes daily as needed (for redness/itching).  Marland Kitchen PROAIR HFA 108 (90 Base) MCG/ACT inhaler Inhale 2 puffs into the lungs every 4 (four) hours as needed for shortness of breath.  . rosuvastatin (CRESTOR) 10 MG tablet Take 10 mg by mouth daily.     Allergies:   Penicillins   Social History   Socioeconomic History  . Marital status: Married    Spouse name: Not on file  . Number of children: Not on file  . Years of education: Not on file  . Highest education level: Not on file  Occupational History  . Not on file  Tobacco Use  . Smoking status: Former Smoker    Packs/day: 2.00    Types: Cigarettes    Quit date: 02/16/1988    Years since quitting: 32.0  . Smokeless tobacco: Never Used  Vaping Use  . Vaping Use: Never used  Substance and Sexual Activity  . Alcohol use: Not Currently  . Drug use: Never  . Sexual activity: Not on file  Other Topics Concern  . Not on file  Social History Narrative  . Not on file   Social Determinants of Health   Financial Resource Strain:   . Difficulty of Paying Living Expenses:   Food Insecurity:   . Worried About Charity fundraiser in the Last Year:   . Arboriculturist in the Last  Year:   Transportation Needs:   . Film/video editor (Medical):   Marland Kitchen Lack of Transportation (Non-Medical):   Physical Activity:   . Days of Exercise per Week:   . Minutes of Exercise per Session:   Stress:   . Feeling of Stress :   Social Connections:   . Frequency of Communication with Friends and Family:   . Frequency of Social Gatherings with Friends and Family:   . Attends Religious Services:   . Active Member of Clubs or Organizations:   . Attends Archivist Meetings:   Marland Kitchen Marital Status:      Family History: The patient's family history includes Clotting disorder in his father; Diabetes in his mother.  ROS:   Please see the  history of present illness.    All other systems reviewed and are negative.  EKGs/Labs/Other Studies Reviewed:    The following studies were reviewed today: I discussed my findings with the patient at length   Recent Labs: 08/19/2019: ALT 9; BUN 16; Creatinine, Ser 0.98; Hemoglobin 11.6; Platelets 216; Potassium 4.8; Sodium 140; TSH 1.200  Recent Lipid Panel    Component Value Date/Time   CHOL 178 08/19/2019 1406   TRIG 101 08/19/2019 1406   HDL 44 08/19/2019 1406   CHOLHDL 4.0 08/19/2019 1406   LDLCALC 116 (H) 08/19/2019 1406    Physical Exam:    VS:  BP (!) 190/72   Pulse (!) 50   Ht 5\' 11"  (1.803 m)   Wt 199 lb (90.3 kg)   BMI 27.75 kg/m     Wt Readings from Last 3 Encounters:  02/17/20 199 lb (90.3 kg)  02/16/20 197 lb 12.8 oz (89.7 kg)  01/08/20 199 lb (90.3 kg)     GEN: Patient is in no acute distress HEENT: Normal NECK: No JVD; No carotid bruits LYMPHATICS: No lymphadenopathy CARDIAC: Hear sounds regular, 2/6 systolic murmur at the apex. RESPIRATORY:  Clear to auscultation without rales, wheezing or rhonchi  ABDOMEN: Soft, non-tender, non-distended MUSCULOSKELETAL:  No edema; No deformity  SKIN: Warm and dry NEUROLOGIC:  Alert and oriented x 3 PSYCHIATRIC:  Normal affect   Signed, Jenean Lindau, MD    02/17/2020 11:07 AM    [place]

## 2020-02-17 NOTE — Patient Instructions (Signed)

## 2020-02-19 DIAGNOSIS — D519 Vitamin B12 deficiency anemia, unspecified: Secondary | ICD-10-CM | POA: Diagnosis not present

## 2020-02-24 ENCOUNTER — Telehealth: Payer: Self-pay | Admitting: Pulmonary Disease

## 2020-02-24 MED ORDER — BREZTRI AEROSPHERE 160-9-4.8 MCG/ACT IN AERO: 2.0000 | INHALATION_SPRAY | Freq: Two times a day (BID) | RESPIRATORY_TRACT | 5 refills | Status: DC

## 2020-02-24 NOTE — Telephone Encounter (Signed)
Patient called, refills for Christopher Rocha sent to preferred pharmacy.

## 2020-03-18 DIAGNOSIS — D519 Vitamin B12 deficiency anemia, unspecified: Secondary | ICD-10-CM | POA: Diagnosis not present

## 2020-04-05 DIAGNOSIS — J189 Pneumonia, unspecified organism: Secondary | ICD-10-CM | POA: Diagnosis not present

## 2020-04-08 ENCOUNTER — Telehealth: Payer: Self-pay | Admitting: *Deleted

## 2020-04-08 NOTE — Telephone Encounter (Signed)
Called patient to inform that he needs labs done on 04-15-20 @ 2:30 pm @ [facility], spoke with patient and he is aware of this appt.

## 2020-04-12 ENCOUNTER — Ambulatory Visit: Payer: Self-pay | Admitting: Radiation Oncology

## 2020-04-15 ENCOUNTER — Ambulatory Visit (HOSPITAL_COMMUNITY)
Admission: RE | Admit: 2020-04-15 | Discharge: 2020-04-15 | Disposition: A | Discharge status: Home / Self Care | Payer: Medicare Other | Source: Ambulatory Visit | Attending: Radiation Oncology | Admitting: Radiation Oncology

## 2020-04-15 ENCOUNTER — Ambulatory Visit
Admission: RE | Admit: 2020-04-15 | Discharge: 2020-04-15 | Disposition: A | Discharge status: Home / Self Care | Payer: Medicare Other | Source: Ambulatory Visit | Attending: Radiation Oncology | Admitting: Radiation Oncology

## 2020-04-15 ENCOUNTER — Other Ambulatory Visit: Payer: Self-pay

## 2020-04-15 ENCOUNTER — Encounter (HOSPITAL_COMMUNITY): Payer: Self-pay

## 2020-04-15 DIAGNOSIS — C3411 Malignant neoplasm of upper lobe, right bronchus or lung: Secondary | ICD-10-CM | POA: Diagnosis not present

## 2020-04-15 DIAGNOSIS — C3491 Malignant neoplasm of unspecified part of right bronchus or lung: Secondary | ICD-10-CM | POA: Diagnosis not present

## 2020-04-15 DIAGNOSIS — K449 Diaphragmatic hernia without obstruction or gangrene: Secondary | ICD-10-CM | POA: Diagnosis not present

## 2020-04-15 DIAGNOSIS — R918 Other nonspecific abnormal finding of lung field: Secondary | ICD-10-CM | POA: Diagnosis not present

## 2020-04-15 DIAGNOSIS — I700 Atherosclerosis of aorta: Secondary | ICD-10-CM | POA: Diagnosis not present

## 2020-04-15 DIAGNOSIS — I2510 Atherosclerotic heart disease of native coronary artery without angina pectoris: Secondary | ICD-10-CM | POA: Diagnosis not present

## 2020-04-15 LAB — BUN & CREATININE (CHCC)
BUN: 17 mg/dL (ref 8–23)
Creatinine: 1.13 mg/dL (ref 0.61–1.24)
GFR, Est AFR Am: >60 mL/min (ref >60)
GFR, Estimated: >60 mL/min (ref >60)

## 2020-04-15 MED ORDER — IOHEXOL 300 MG/ML  SOLN
75.0000 mL | Freq: Once | INTRAMUSCULAR | Status: AC | PRN
  Administered 2020-04-15: 75 mL via INTRAVENOUS

## 2020-04-15 MED ORDER — SODIUM CHLORIDE (PF) 0.9 % IJ SOLN
INTRAMUSCULAR | Status: AC
  Filled 2020-04-15: qty 50

## 2020-04-19 DIAGNOSIS — D519 Vitamin B12 deficiency anemia, unspecified: Secondary | ICD-10-CM | POA: Diagnosis not present

## 2020-04-21 NOTE — Progress Notes (Signed)
Radiation Oncology         [phone] ________________________________  Name: Christopher Rocha MRN: [id number]  Date: 04/22/2020  DOB: [birth year]  Follow-Up Visit Note  CC: [name], MD  [name], MD    ICD-10-CM   1. Adenocarcinoma, lung, right ([facility])  C34.91 CT Chest W Contrast    BUN & Creatinine ([facility])    Diagnosis: Stage IIA (T2b, n0, m0) Adenocarcinoma presenting in the right upper lobe  Interval Since Last Radiation: Ten months, one week, and six days.  06/03/2019 through 06/10/2019 Site Technique Total Dose Dose per Fx Completed Fx Beam Energies  Thorax: Lung_Rt IMRT 54/54 18 3/3 6XFFF    Narrative:  The patient returns today for routine follow-up. Since his last visit, he followed-up with Dr. Elsworth Soho on 02/16/2020. They discussed his recent imaging up to that point and his Symbicort was changed to Welty.   Most recent chest CT scan on 04/15/2020 showed further contraction of the right suprahilar process that was consistent with response to therapy. There was a progressive clustered nodularity in the left lung base with enlarging solid components and new involvement of the right middle and lower lobes over the last three months. The appearance remained most consistent with atypical infection. There was no adenopathy or pleural effusion noted.  Of note is the patient reports being diagnosed with left lower lobe pneumonia by his primary care physician down in [place].  Patient was placed on 10-day course of antibiotics.  Overall he is feeling better.  These changes in the CT scan are likely related to his pneumonia.  He is unsure which antibiotic he was placed on.  On review of systems, he reports occasional shortness of breath if he eats too much. He denies chest pain or hemoptysis.Marland Kitchen  ALLERGIES:  is allergic to penicillins.  Meds: Current Outpatient Medications  Medication Sig Dispense Refill  . acetaminophen (TYLENOL) 500 MG tablet Take 500 mg by mouth  every 8 (eight) hours as needed (pain).    Marland Kitchen aspirin EC 81 MG tablet Take 81 mg by mouth every other day.     . Budeson-Glycopyrrol-Formoterol (BREZTRI AEROSPHERE) 160-9-4.8 MCG/ACT AERO Inhale 2 puffs into the lungs 2 (two) times daily. 5.9 g 5  . budesonide-formoterol (SYMBICORT) 160-4.5 MCG/ACT inhaler Inhale 2 puffs into the lungs 2 (two) times daily. 1 Inhaler 5  . ibuprofen (ADVIL) 200 MG tablet Take 200 mg by mouth every 6 (six) hours as needed.    Marland Kitchen losartan-hydrochlorothiazide (HYZAAR) 100-12.5 MG tablet Take 1 tablet by mouth daily.    . Naphazoline-Glycerin (REDNESS RELIEF OP) Place 1 drop into both eyes daily as needed (for redness/itching).    Marland Kitchen PROAIR HFA 108 (90 Base) MCG/ACT inhaler Inhale 2 puffs into the lungs every 4 (four) hours as needed for shortness of breath.    . rosuvastatin (CRESTOR) 10 MG tablet Take 10 mg by mouth daily.     No current facility-administered medications for this encounter.    Physical Findings: The patient is in no acute distress. Patient is alert and oriented. Lungs are clear to auscultation bilaterally. Heart has regular rate and rhythm. No palpable cervical, supraclavicular, or axillary adenopathy. Abdomen soft, non-tender, normal bowel sounds.   height is _0  (1.803 m) and weight is 198 lb 12.8 oz (90.2 kg). His temperature is 97.8 F (36.6 C). His blood pressure is 172/89 (abnormal) and his pulse is 74. His respiration is 20 and oxygen saturation is 98%.   Lab Findings:  Lab Results  Component Value Date   WBC 9.3 08/19/2019   HGB 11.6 (L) 08/19/2019   HCT 34.4 (L) 08/19/2019   MCV 90 08/19/2019   PLT 216 08/19/2019    Radiographic Findings: CT Chest W Contrast  Result Date: 04/16/2020 CLINICAL DATA:  Non-small cell lung cancer. Assess treatment response. Last radiation therapy 2-3 months ago. EXAM: CT CHEST WITH CONTRAST TECHNIQUE: Multidetector CT imaging of the chest was performed during intravenous contrast administration.  CONTRAST:  67m OMNIPAQUE IOHEXOL 300 MG/ML  SOLN COMPARISON:  CT 01/26/2020 and 10/21/2019.  PET-CT 01/07/2020. FINDINGS: Cardiovascular: Diffuse atherosclerosis of the aorta, great vessels and coronary arteries status post median sternotomy, TAVR and probable CABG. No acute vascular findings. The heart size is normal. There is no pericardial effusion. Mediastinum/Nodes: There are no enlarged mediastinal, hilar or axillary lymph nodes.Small mediastinal lymph nodes are stable. There is a stable small hiatal hernia. The thyroid gland and trachea demonstrate no significant findings. Lungs/Pleura: There is no pleural effusion or pneumothorax. There has been further contraction of the right suprahilar process, now measuring approximately 5.9 x 3.8 cm on image 39/7 (previously 6.2 x 4.9 cm). Compared with the most recent CT and PET-CT of 3 months ago, there is increasing clustered nodularity at the left lung base with enlarging solid components measuring up to 2.1 cm on image 117/7 and 1.6 cm on image 125/7. In addition, there is new involvement of the right middle and lower lobes, and some of these nodules are part solid and ground-glass in nature. This appearance is not typical for metastatic disease, and these are likely infectious/inflammatory. Given the presence for 3 months, consider atypical infection such as mycobacterium avium complex. Upper abdomen: The visualized upper abdomen appears stable without significant findings. There are bilateral renal cysts. Musculoskeletal/Chest wall: There is no chest wall mass or suspicious osseous finding. Prior median sternotomy. IMPRESSION: 1. Further contraction of right suprahilar process consistent with response to therapy. 2. Progressive clustered nodularity at the left lung base with enlarging solid components, and new involvement of the right middle and lower lobes over the last 3 months. This appearance remains most consistent with atypical infection. Consider  Mycobacterium avium complex or viral infection. Continued follow-up recommended. 3. No adenopathy or pleural effusion. 4. Aortic Atherosclerosis (ICD10-I70.0). Electronically Signed   By: WRichardean SaleM.D.   On: 04/16/2020 11:20    Impression: Stage IIA (T2b, n0, m0) Adenocarcinoma presenting in the right upper lobe  Most recent chest CT scan showed further contraction of the right suprahilar process that was consistent with response to therapy. There was a progressive clustered nodularity in the left lung base with enlarging solid components and new involvement of the right middle and lower lobes over the last three months. The appearance remained most consistent with atypical infection.  As above patient recently completed course of antibiotics for pneumonia.  He overall is feeling better after his course of antibiotic therapy.  Plan: The patient will follow-up with radiation oncology in six months. He will undergo a repeat chest CT scan/ BUN/CR just prior to that visit.  Total time spent in this encounter was 25 minutes which included reviewing the patient's most recent follow-up with Dr. AElsworth Soho chest CT scan, physical examination, ordering of future chest CT scan, and documentation. ____________________________________   JBlair Promise PhD, MD  This document serves as a record of services personally performed by JGery Pray MD. It was created on his behalf by MClerance Lav a trained medical scribe. The  creation of this record is based on the scribe's personal observations and the provider's statements to them. This document has been checked and approved by the attending provider.

## 2020-04-22 ENCOUNTER — Ambulatory Visit
Admission: RE | Admit: 2020-04-22 | Discharge: 2020-04-22 | Disposition: A | Discharge status: Home / Self Care | Payer: Medicare Other | Source: Ambulatory Visit | Attending: Radiation Oncology | Admitting: Radiation Oncology

## 2020-04-22 ENCOUNTER — Other Ambulatory Visit: Payer: Self-pay

## 2020-04-22 ENCOUNTER — Encounter: Payer: Self-pay | Admitting: Radiation Oncology

## 2020-04-22 VITALS — BP 172/89 | HR 74 | Temp 97.8°F | Resp 20 | Ht 71.0 in | Wt 198.8 lb

## 2020-04-22 DIAGNOSIS — Z7982 Long term (current) use of aspirin: Secondary | ICD-10-CM | POA: Insufficient documentation

## 2020-04-22 DIAGNOSIS — Z79899 Other long term (current) drug therapy: Secondary | ICD-10-CM | POA: Insufficient documentation

## 2020-04-22 DIAGNOSIS — C3491 Malignant neoplasm of unspecified part of right bronchus or lung: Secondary | ICD-10-CM

## 2020-04-22 DIAGNOSIS — Z923 Personal history of irradiation: Secondary | ICD-10-CM | POA: Insufficient documentation

## 2020-04-22 DIAGNOSIS — Z8709 Personal history of other diseases of the respiratory system: Secondary | ICD-10-CM | POA: Diagnosis not present

## 2020-04-22 DIAGNOSIS — Z8701 Personal history of pneumonia (recurrent): Secondary | ICD-10-CM | POA: Diagnosis not present

## 2020-04-22 DIAGNOSIS — Z08 Encounter for follow-up examination after completed treatment for malignant neoplasm: Secondary | ICD-10-CM | POA: Diagnosis not present

## 2020-04-22 DIAGNOSIS — C3411 Malignant neoplasm of upper lobe, right bronchus or lung: Secondary | ICD-10-CM | POA: Diagnosis not present

## 2020-04-22 NOTE — Progress Notes (Signed)
Patient here to review his CT results from 8/12 with Dr. Sondra Come. Patient reports having pneumonia 10 days ago. Patient occasionally has shortness of breath if he eats too much.  BP (!) 172/89 (BP Location: Right Arm, Patient Position: Sitting, Cuff Size: Normal)   Pulse 74   Temp 97.8 F (36.6 C)   Resp 20   Ht 5\' 11"  (1.803 m)   Wt 198 lb 12.8 oz (90.2 kg)   SpO2 98%   BMI 27.73 kg/m    Wt Readings from Last 3 Encounters:  04/22/20 198 lb 12.8 oz (90.2 kg)  02/17/20 199 lb (90.3 kg)  02/16/20 197 lb 12.8 oz (89.7 kg)

## 2020-05-19 DIAGNOSIS — D519 Vitamin B12 deficiency anemia, unspecified: Secondary | ICD-10-CM | POA: Diagnosis not present

## 2020-06-15 DIAGNOSIS — L259 Unspecified contact dermatitis, unspecified cause: Secondary | ICD-10-CM | POA: Diagnosis not present

## 2020-06-17 ENCOUNTER — Ambulatory Visit: Payer: Medicare Other | Admitting: Adult Health

## 2020-06-25 DIAGNOSIS — Z23 Encounter for immunization: Secondary | ICD-10-CM | POA: Diagnosis not present

## 2020-06-25 DIAGNOSIS — D519 Vitamin B12 deficiency anemia, unspecified: Secondary | ICD-10-CM | POA: Diagnosis not present

## 2020-07-02 ENCOUNTER — Other Ambulatory Visit: Payer: Self-pay | Admitting: Hematology and Oncology

## 2020-07-02 DIAGNOSIS — D509 Iron deficiency anemia, unspecified: Secondary | ICD-10-CM

## 2020-07-09 DIAGNOSIS — B029 Zoster without complications: Secondary | ICD-10-CM | POA: Diagnosis not present

## 2020-07-13 ENCOUNTER — Other Ambulatory Visit: Payer: Self-pay | Admitting: Oncology

## 2020-07-13 DIAGNOSIS — D509 Iron deficiency anemia, unspecified: Secondary | ICD-10-CM

## 2020-07-13 NOTE — Progress Notes (Signed)
[facility]  [address] [place],  [place]  [address] [phone]  Clinic Day:  07/14/2020  Referring physician: [name], MD   HISTORY OF PRESENT ILLNESS:  The patient is a 79 y.o. male with a history of iron deficiency anemia, which previously improved after he received IV iron.  However, more importantly, he has stage IIA (T2b N0 M0) lung adenocarcinoma.  The patient completed stereotactic radiation to this lesion in November 2020.  A PET scan done afterwards showed no evidence of disease recurrence.  He comes in today for routine follow-up.  Since his last visit, the patient has been doing well.  He denies having increased fatigue or any overt forms of blood loss which concern him for progressive anemia.  With respect to his lung cancer, he denies having any new respiratory symptoms that concern him for disease progression related to his lung cancer.  He is scheduled for an additional lung scan in the forthcoming weeks.  PHYSICAL EXAM:  Blood pressure (!) 207/86, pulse 73, temperature 97.6 F (36.4 C), temperature source Oral, resp. rate 16, height 5\' 11"  (1.803 m), weight 188 lb 12.8 oz (85.6 kg), SpO2 95 %. Wt Readings from Last 3 Encounters:  07/14/20 188 lb 12.8 oz (85.6 kg)  04/22/20 198 lb 12.8 oz (90.2 kg)  02/17/20 199 lb (90.3 kg)   Body mass index is 26.33 kg/m. Performance status (ECOG): 1 - Symptomatic but completely ambulatory Physical Exam Constitutional:      Appearance: Normal appearance. He is not ill-appearing.  HENT:     Mouth/Throat:     Mouth: Mucous membranes are moist.     Pharynx: Oropharynx is clear. No oropharyngeal exudate or posterior oropharyngeal erythema.  Cardiovascular:     Rate and Rhythm: Normal rate and regular rhythm.     Heart sounds: No murmur heard.  No friction rub. No gallop.   Pulmonary:     Effort: Pulmonary effort is normal. No respiratory distress.     Breath sounds: Wheezing present.  No rhonchi or rales.  Abdominal:     General: Bowel sounds are normal. There is no distension.     Palpations: Abdomen is soft. There is no mass.     Tenderness: There is no abdominal tenderness.  Musculoskeletal:        General: No swelling.     Right lower leg: No edema.     Left lower leg: No edema.  Lymphadenopathy:     Cervical: No cervical adenopathy.     Upper Body:     Right upper body: No supraclavicular or axillary adenopathy.     Left upper body: No supraclavicular or axillary adenopathy.     Lower Body: No right inguinal adenopathy. No left inguinal adenopathy.  Skin:    General: Skin is warm.     Coloration: Skin is not jaundiced.     Findings: No lesion or rash.  Neurological:     General: No focal deficit present.     Mental Status: He is alert and oriented to person, place, and time. Mental status is at baseline.     Cranial Nerves: Cranial nerves are intact.  Psychiatric:        Mood and Affect: Mood normal.        Behavior: Behavior normal.        Thought Content: Thought content normal.     LABS:   CBC Latest Ref Rng & Units 07/14/2020 08/19/2019 05/19/2019  WBC -  11.7 9.3 10.0  Hemoglobin 13.5 - 17.5 11.2(A) 11.6(L) 12.9(L)  Hematocrit 41 - 53 35(A) 34.4(L) 39.8  Platelets 150 - 399 267 216 250   CMP Latest Ref Rng & Units 07/14/2020 04/15/2020 08/19/2019  Glucose 65 - 99 mg/dL - - 119(H)  BUN 4 - 21 17 17 16   Creatinine 0.6 - 1.3 1.0 1.13 0.98  Sodium 137 - 147 136(A) - 140  Potassium 3.4 - 5.3 4.6 - 4.8  Chloride 99 - 108 104 - 100  CO2 13 - 22 23(A) - 23  Calcium 8.7 - 10.7 9.4 - 9.7  Total Protein 6.0 - 8.5 g/dL - - 7.2  Total Bilirubin 0.0 - 1.2 mg/dL - - 0.5  Alkaline Phos 25 - 125 92 - 106  AST 14 - 40 23 - 14  ALT 10 - 40 13 - 9    Lab Results  Component Value Date   TIBC 290 07/14/2020   FERRITIN 134 07/14/2020   IRONPCTSAT 13 (L) 07/14/2020     ASSESSMENT & PLAN:   Assessment/Plan:  A 79 y.o. male with stage IIA lung  adenocarcinoma, status post stereotactic radiation in November 2020.  He also has a history of iron deficiency anemia.  From a hematologic standpoint, his iron and hemoglobin levels are holding stable.  As that is the case, I will see him back in 6 months for repeat clinical assessment. The patient understands all the plans discussed today and is in agreement with them.      Nanci Lakatos Macarthur Critchley, MD

## 2020-07-14 ENCOUNTER — Other Ambulatory Visit: Payer: Self-pay | Admitting: Oncology

## 2020-07-14 ENCOUNTER — Inpatient Hospital Stay: Payer: Medicare Other | Attending: Oncology | Admitting: Hematology and Oncology

## 2020-07-14 ENCOUNTER — Other Ambulatory Visit: Payer: Self-pay

## 2020-07-14 ENCOUNTER — Inpatient Hospital Stay (INDEPENDENT_AMBULATORY_CARE_PROVIDER_SITE_OTHER): Payer: Medicare Other | Admitting: Oncology

## 2020-07-14 VITALS — BP 207/86 | HR 73 | Temp 97.6°F | Resp 16 | Ht 71.0 in | Wt 188.8 lb

## 2020-07-14 DIAGNOSIS — Z85118 Personal history of other malignant neoplasm of bronchus and lung: Secondary | ICD-10-CM

## 2020-07-14 DIAGNOSIS — D649 Anemia, unspecified: Secondary | ICD-10-CM | POA: Diagnosis not present

## 2020-07-14 DIAGNOSIS — D509 Iron deficiency anemia, unspecified: Secondary | ICD-10-CM

## 2020-07-14 DIAGNOSIS — Z0001 Encounter for general adult medical examination with abnormal findings: Secondary | ICD-10-CM | POA: Diagnosis not present

## 2020-07-14 LAB — BASIC METABOLIC PANEL
BUN: 17 (ref 4–21)
CO2: 23 — AB (ref 13–22)
Chloride: 104 (ref 99–108)
Creatinine: 1 (ref 0.6–1.3)
Glucose: 148
Potassium: 4.6 (ref 3.4–5.3)
Sodium: 136 — AB (ref 137–147)

## 2020-07-14 LAB — FERRITIN: Ferritin: 134 ng/mL (ref 24–336)

## 2020-07-14 LAB — IRON AND TIBC
Iron: 39 ug/dL — ABNORMAL LOW (ref 45–182)
Saturation Ratios: 13 % — ABNORMAL LOW (ref 17.9–39.5)
TIBC: 290 ug/dL (ref 250–450)
UIBC: 251 ug/dL

## 2020-07-14 LAB — HEPATIC FUNCTION PANEL
ALT: 13 (ref 10–40)
AST: 23 (ref 14–40)
Alkaline Phosphatase: 92 (ref 25–125)
Bilirubin, Total: 0.6

## 2020-07-14 LAB — CBC: RBC: 3.9 (ref 3.87–5.11)

## 2020-07-14 LAB — CBC AND DIFFERENTIAL
HCT: 35 — AB (ref 41–53)
Hemoglobin: 11.2 — AB (ref 13.5–17.5)
Neutrophils Absolute: 7.49
Platelets: 267 (ref 150–399)
WBC: 11.7

## 2020-07-14 LAB — COMPREHENSIVE METABOLIC PANEL
Albumin: 3.6 (ref 3.5–5.0)
Calcium: 9.4 (ref 8.7–10.7)

## 2020-07-14 NOTE — Progress Notes (Signed)
Pt states he has hives all over his body.  PCP is aware.

## 2020-07-22 DIAGNOSIS — D519 Vitamin B12 deficiency anemia, unspecified: Secondary | ICD-10-CM | POA: Diagnosis not present

## 2020-08-03 DIAGNOSIS — L501 Idiopathic urticaria: Secondary | ICD-10-CM | POA: Diagnosis not present

## 2020-08-03 DIAGNOSIS — L300 Nummular dermatitis: Secondary | ICD-10-CM | POA: Diagnosis not present

## 2020-08-18 DIAGNOSIS — D649 Anemia, unspecified: Secondary | ICD-10-CM | POA: Insufficient documentation

## 2020-08-18 DIAGNOSIS — I2510 Atherosclerotic heart disease of native coronary artery without angina pectoris: Secondary | ICD-10-CM | POA: Insufficient documentation

## 2020-08-18 DIAGNOSIS — I350 Nonrheumatic aortic (valve) stenosis: Secondary | ICD-10-CM | POA: Insufficient documentation

## 2020-08-18 DIAGNOSIS — R911 Solitary pulmonary nodule: Secondary | ICD-10-CM | POA: Insufficient documentation

## 2020-08-18 DIAGNOSIS — E785 Hyperlipidemia, unspecified: Secondary | ICD-10-CM | POA: Insufficient documentation

## 2020-08-18 DIAGNOSIS — I10 Essential (primary) hypertension: Secondary | ICD-10-CM | POA: Insufficient documentation

## 2020-08-18 DIAGNOSIS — Z951 Presence of aortocoronary bypass graft: Secondary | ICD-10-CM | POA: Insufficient documentation

## 2020-08-19 ENCOUNTER — Ambulatory Visit (INDEPENDENT_AMBULATORY_CARE_PROVIDER_SITE_OTHER): Payer: Medicare Other | Admitting: Cardiology

## 2020-08-19 ENCOUNTER — Other Ambulatory Visit: Payer: Self-pay

## 2020-08-19 ENCOUNTER — Telehealth: Payer: Self-pay

## 2020-08-19 ENCOUNTER — Encounter: Payer: Self-pay | Admitting: Cardiology

## 2020-08-19 VITALS — BP 170/80 | HR 84 | Ht 71.0 in | Wt 172.2 lb

## 2020-08-19 DIAGNOSIS — I2510 Atherosclerotic heart disease of native coronary artery without angina pectoris: Secondary | ICD-10-CM

## 2020-08-19 DIAGNOSIS — I4891 Unspecified atrial fibrillation: Secondary | ICD-10-CM

## 2020-08-19 DIAGNOSIS — I10 Essential (primary) hypertension: Secondary | ICD-10-CM

## 2020-08-19 DIAGNOSIS — Z951 Presence of aortocoronary bypass graft: Secondary | ICD-10-CM

## 2020-08-19 DIAGNOSIS — Z952 Presence of prosthetic heart valve: Secondary | ICD-10-CM | POA: Diagnosis not present

## 2020-08-19 HISTORY — DX: Unspecified atrial fibrillation: I48.91

## 2020-08-19 MED ORDER — APIXABAN 5 MG PO TABS: 5.0000 mg | ORAL_TABLET | Freq: Two times a day (BID) | ORAL | 12 refills | Status: DC

## 2020-08-19 NOTE — Patient Instructions (Addendum)
Medication Instructions:  Your physician has recommended you make the following change in your medication:   Start Eliquis 5 mg twice daily.  *If you need a refill on your cardiac medications before your next appointment, please call your pharmacy*   Lab Work: None ordered If you have labs (blood work) drawn today and your tests are completely normal, you will receive your results only by: Marland Kitchen MyChart Message (if you have MyChart) OR . A paper copy in the mail If you have any lab test that is abnormal or we need to change your treatment, we will call you to review the results.   Testing/Procedures: None ordered   Follow-Up: At [facility], you and your health needs are our priority.  As part of our continuing mission to provide you with exceptional heart care, we have created designated Provider Care Teams.  These Care Teams include your primary Cardiologist (physician) and Advanced Practice Providers (APPs -  Physician Assistants and Nurse Practitioners) who all work together to provide you with the care you need, when you need it.  We recommend signing up for the patient portal called "MyChart".  Sign up information is provided on this After Visit Summary.  MyChart is used to connect with patients for Virtual Visits (Telemedicine).  Patients are able to view lab/test results, encounter notes, upcoming appointments, etc.  Non-urgent messages can be sent to your provider as well.   To learn more about what you can do with MyChart, go to [email or web address].    Your next appointment:   1 month(s)  The format for your next appointment:   In Person  Provider:   Jyl Heinz, MD   Other Instructions Apixaban oral tablets What is this medicine? APIXABAN (a PIX a ban) is an anticoagulant (blood thinner). It is used to lower the chance of stroke in people with a medical condition called atrial fibrillation. It is also used to treat or prevent blood clots in the lungs or in the  veins. This medicine may be used for other purposes; ask your health care provider or pharmacist if you have questions. COMMON BRAND NAME(S): Eliquis What should I tell my health care provider before I take this medicine? They need to know if you have any of these conditions:  antiphospholipid antibody syndrome  bleeding disorders  bleeding in the brain  blood in your stools (black or tarry stools) or if you have blood in your vomit  history of blood clots  history of stomach bleeding  kidney disease  liver disease  mechanical heart valve  an unusual or allergic reaction to apixaban, other medicines, foods, dyes, or preservatives  pregnant or trying to get pregnant  breast-feeding How should I use this medicine? Take this medicine by mouth with a glass of water. Follow the directions on the prescription label. You can take it with or without food. If it upsets your stomach, take it with food. Take your medicine at regular intervals. Do not take it more often than directed. Do not stop taking except on your doctor's advice. Stopping this medicine may increase your risk of a blood clot. Be sure to refill your prescription before you run out of medicine. Talk to your pediatrician regarding the use of this medicine in children. Special care may be needed. Overdosage: If you think you have taken too much of this medicine contact a poison control center or emergency room at once. NOTE: This medicine is only for you. Do not share this medicine with others.  What if I miss a dose? If you miss a dose, take it as soon as you can. If it is almost time for your next dose, take only that dose. Do not take double or extra doses. What may interact with this medicine? This medicine may interact with the following:  aspirin and aspirin-like medicines  certain medicines for fungal infections like ketoconazole and itraconazole  certain medicines for seizures like carbamazepine and  phenytoin  certain medicines that treat or prevent blood clots like warfarin, enoxaparin, and dalteparin  clarithromycin  NSAIDs, medicines for pain and inflammation, like ibuprofen or naproxen  rifampin  ritonavir  St. John's wort This list may not describe all possible interactions. Give your health care provider a list of all the medicines, herbs, non-prescription drugs, or dietary supplements you use. Also tell them if you smoke, drink alcohol, or use illegal drugs. Some items may interact with your medicine. What should I watch for while using this medicine? Visit your healthcare professional for regular checks on your progress. You may need blood work done while you are taking this medicine. Your condition will be monitored carefully while you are receiving this medicine. It is important not to miss any appointments. Avoid sports and activities that might cause injury while you are using this medicine. Severe falls or injuries can cause unseen bleeding. Be careful when using sharp tools or knives. Consider using an Copy. Take special care brushing or flossing your teeth. Report any injuries, bruising, or red spots on the skin to your healthcare professional. If you are going to need surgery or other procedure, tell your healthcare professional that you are taking this medicine. Wear a medical ID bracelet or chain. Carry a card that describes your disease and details of your medicine and dosage times. What side effects may I notice from receiving this medicine? Side effects that you should report to your doctor or health care professional as soon as possible:  allergic reactions like skin rash, itching or hives, swelling of the face, lips, or tongue  signs and symptoms of bleeding such as bloody or black, tarry stools; red or dark-brown urine; spitting up blood or brown material that looks like coffee grounds; red spots on the skin; unusual bruising or bleeding from the eye,  gums, or nose  signs and symptoms of a blood clot such as chest pain; shortness of breath; pain, swelling, or warmth in the leg  signs and symptoms of a stroke such as changes in vision; confusion; trouble speaking or understanding; severe headaches; sudden numbness or weakness of the face, arm or leg; trouble walking; dizziness; loss of coordination This list may not describe all possible side effects. Call your doctor for medical advice about side effects. You may report side effects to FDA at 1-800-FDA-1088. Where should I keep my medicine? Keep out of the reach of children. Store at room temperature between 20 and 25 degrees C (68 and 77 degrees F). Throw away any unused medicine after the expiration date. NOTE: This sheet is a summary. It may not cover all possible information. If you have questions about this medicine, talk to your doctor, pharmacist, or health care provider.  2020 Elsevier/Gold Standard (2018-05-01 17:39:34)

## 2020-08-19 NOTE — Progress Notes (Signed)
Cardiology Office Note:    Date:  08/19/2020   ID:  Christopher Rocha, DOB [birth year], MRN [id number]  PCP:  [name], MD  Cardiologist:  Jenean Lindau, MD   Referring MD: [name], MD    ASSESSMENT:    1. Coronary artery disease involving native coronary artery of native heart without angina pectoris   2. Essential hypertension   3. Primary hypertension   4. S/P TAVR (transcatheter aortic valve replacement)   5. S/P CABG (coronary artery bypass graft)   6. Atrial fibrillation, unspecified type ([place])    PLAN:    In order of problems listed above:  1. Newly diagnosed atrial fibrillation: Patient has never been known to have atrial fibrillation.  Medical suppliers about his EKG.  He is overall asymptomatic.  In view of this following recommendations were made to him.I discussed with the patient atrial fibrillation, disease process. Management and therapy including rate and rhythm control, anticoagulation benefits and potential risks were discussed extensively with the patient. Patient had multiple questions which were answered to patient's satisfaction.  I also reviewed blood work from recent including anemia evaluation.  He tells me that his colonoscopy has been fine.  I will give him an ifob and do blood work today.  We will initiate him on Eliquis 5 mg twice daily and he will be back in a month for follow-up.  He will let us know or his primary care doctor know about dark stools or any such issues. 2. S/p TAVR: Stable at this time and discussed with the patient. 3. Coronary artery disease: Secondary prevention stressed.  Importance of compliance with diet medication stressed and he vocalized understanding. 4. Mixed dyslipidemia: Diet was emphasized and patient on statin therapy. 5. Patient will be seen in follow-up appointment in 1 months or earlier if the patient has any concerns 6.    Medication Adjustments/Labs and Tests Ordered: Current medicines are reviewed  at length with the patient today.  Concerns regarding medicines are outlined above.  No orders of the defined types were placed in this encounter.  No orders of the defined types were placed in this encounter.    No chief complaint on file.    History of Present Illness:    Christopher Rocha is a 79 y.o. male.  Patient has past medical history of coronary artery disease, essential hypertension dyslipidemia and post TAVR surgery for aortic stenosis.  He denies any problems at this time.  He tells me that he takes care of activities of daily living without any problem.  He is post adenocarcinoma lung resection and radiation and he tells me that his remission.  At the time of my evaluation, the patient is alert awake oriented and in no distress.  Past Medical History:  Diagnosis Date  . Acute on chronic diastolic heart failure ([place]) 07/02/2018  . 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 Anemia    iron deficiency   . CAD (coronary artery disease) 03/25/2018  . COPD (chronic obstructive pulmonary disease) ([place]) 06/14/2018   05/2018 FEV1 60%, DLCO 53%  . Coronary artery disease   . Essential hypertension 03/25/2018  . HLD (hyperlipidemia)   . HTN (hypertension)   . Hx of CABG 03/25/2018  . Mixed dyslipidemia 03/25/2018  . Pulmonary nodule   . S/P CABG (coronary artery bypass graft)   . S/P TAVR (transcatheter aortic valve replacement)    Edwards Sapien XT THV (size 29 mm, model # 9300TFX, serial # [id number])  .  Severe aortic stenosis     Past Surgical History:  Procedure Laterality Date  . BONE MARROW ASPIRATION    . CARDIAC CATHETERIZATION    . CARDIAC SURGERY    . COLONOSCOPY    . CORONARY ARTERY BYPASS GRAFT    . EYE SURGERY     bilateral cataracts  . FIBEROPTIC BRONCHOSCOPY  04/08/2019      . RIGHT/LEFT HEART CATH AND CORONARY/GRAFT ANGIOGRAPHY N/A 05/08/2018   Procedure: RIGHT/LEFT HEART CATH AND CORONARY/GRAFT ANGIOGRAPHY;  Surgeon: Wellington Hampshire, MD;  Location: [facility];  Service: Cardiovascular;  Laterality: N/A;  . TEE WITHOUT CARDIOVERSION  07/02/2018   Procedure: TRANSESOPHAGEAL ECHOCARDIOGRAM (TEE);  Surgeon: Sherren Mocha, MD;  Location: [facility];  Service: Open Heart Surgery;;  . TRANSCATHETER AORTIC VALVE REPLACEMENT, TRANSAPICAL N/A 07/02/2018   Procedure: TRANSCATHETER AORTIC VALVE REPLACEMENT, TRANSAPICAL;  Surgeon: Sherren Mocha, MD;  Location: [facility];  Service: Open Heart Surgery;  Laterality: N/A;  . VIDEO BRONCHOSCOPY Bilateral 05/31/2018   Procedure: VIDEO BRONCHOSCOPY WITH FLUORO;  Surgeon: Rigoberto Noel, MD;  Location: [facility];  Service: Cardiopulmonary;  Laterality: Bilateral;  . VIDEO BRONCHOSCOPY WITH ENDOBRONCHIAL NAVIGATION N/A 04/08/2019   Procedure: VIDEO BRONCHOSCOPY WITH ENDOBRONCHIAL NAVIGATION;  Surgeon: Rigoberto Noel, MD;  Location: [facility];  Service: Thoracic;  Laterality: N/A;  . VIDEO BRONCHOSCOPY WITH ENDOBRONCHIAL ULTRASOUND N/A 04/08/2019   Procedure: Video Bronchoscopy With Endobronchial Ultrasound;  Surgeon: Rigoberto Noel, MD;  Location: [facility];  Service: Thoracic;  Laterality: N/A;  . WISDOM TOOTH EXTRACTION      Current Medications: Current Meds  Medication Sig  . acetaminophen (TYLENOL) 500 MG tablet Take 500 mg by mouth every 8 (eight) hours as needed (pain).  Marland Kitchen aspirin EC 81 MG tablet Take 81 mg by mouth every other day.   . budesonide-formoterol (SYMBICORT) 160-4.5 MCG/ACT inhaler Inhale 2 puffs into the lungs 2 (two) times daily.  Marland Kitchen ibuprofen (ADVIL) 200 MG tablet Take 200 mg by mouth every 6 (six) hours as needed.  Marland Kitchen losartan-hydrochlorothiazide (HYZAAR) 100-12.5 MG tablet Take 1 tablet by mouth daily.  . Naphazoline-Glycerin (REDNESS RELIEF OP) Place 1 drop into both eyes daily as needed (for redness/itching).  Marland Kitchen PROAIR HFA 108 (90 Base) MCG/ACT inhaler Inhale 2 puffs into the lungs every 4 (four) hours as needed for shortness of breath.  . rosuvastatin (CRESTOR) 10 MG tablet Take 10 mg by mouth  daily.  Marland Kitchen triamcinolone (KENALOG) 0.1 % Apply 1 application topically. 2-3 times daily     Allergies:   Penicillins   Social History   Socioeconomic History  . Marital status: Married    Spouse name: Not on file  . Number of children: Not on file  . Years of education: Not on file  . Highest education level: Not on file  Occupational History  . Not on file  Tobacco Use  . Smoking status: Former Smoker    Packs/day: 2.00    Types: Cigarettes    Quit date: 02/16/1988    Years since quitting: 32.5  . Smokeless tobacco: Never Used  Vaping Use  . Vaping Use: Never used  Substance and Sexual Activity  . Alcohol use: Not Currently  . Drug use: Never  . Sexual activity: Not on file  Other Topics Concern  . Not on file  Social History Narrative  . Not on file   Social Determinants of Health   Financial Resource Strain: Not on file  Food Insecurity: Not on file  Transportation  Needs: Not on file  Physical Activity: Not on file  Stress: Not on file  Social Connections: Not on file     Family History: The patient's family history includes Clotting disorder in his father; Diabetes in his mother.  ROS:   Please see the history of present illness.    All other systems reviewed and are negative.  EKGs/Labs/Other Studies Reviewed:    The following studies were reviewed today: EKG reveals atrial fibrillation with fairly controlled ventricular rate.   Recent Labs: 07/14/2020: ALT 13; BUN 17; Creatinine 1.0; Hemoglobin 11.2; Platelets 267; Potassium 4.6; Sodium 136  Recent Lipid Panel    Component Value Date/Time   CHOL 178 08/19/2019 1406   TRIG 101 08/19/2019 1406   HDL 44 08/19/2019 1406   CHOLHDL 4.0 08/19/2019 1406   LDLCALC 116 (H) 08/19/2019 1406    Physical Exam:    VS:  BP (!) 170/80   Pulse 84   Ht 5\' 11"  (1.803 m)   Wt 172 lb 3.2 oz (78.1 kg)   SpO2 94%   BMI 24.02 kg/m     Wt Readings from Last 3 Encounters:  08/19/20 172 lb 3.2 oz (78.1 kg)   07/14/20 188 lb 12.8 oz (85.6 kg)  04/22/20 198 lb 12.8 oz (90.2 kg)     GEN: Patient is in no acute distress HEENT: Normal NECK: No JVD; No carotid bruits LYMPHATICS: No lymphadenopathy CARDIAC: Hear sounds irregular, 2/6 systolic murmur at the apex. RESPIRATORY:  Clear to auscultation without rales, wheezing or rhonchi  ABDOMEN: Soft, non-tender, non-distended MUSCULOSKELETAL:  No edema; No deformity  SKIN: Warm and dry NEUROLOGIC:  Alert and oriented x 3 PSYCHIATRIC:  Normal affect   Signed, Jenean Lindau, MD  08/19/2020 3:46 PM    [facility]

## 2020-08-19 NOTE — Telephone Encounter (Signed)
4 Boxes of Eliquis 5 mg samples given in office. Lot Number: [id number] Expiration Date: 10-23

## 2020-08-20 ENCOUNTER — Telehealth: Payer: Self-pay

## 2020-08-20 DIAGNOSIS — D519 Vitamin B12 deficiency anemia, unspecified: Secondary | ICD-10-CM | POA: Diagnosis not present

## 2020-08-20 DIAGNOSIS — R739 Hyperglycemia, unspecified: Secondary | ICD-10-CM | POA: Diagnosis not present

## 2020-08-20 LAB — CBC WITH DIFFERENTIAL/PLATELET
Basophils Absolute: 0 10*3/uL (ref 0.0–0.2)
Basos: 0 %
EOS (ABSOLUTE): 0.9 10*3/uL — ABNORMAL HIGH (ref 0.0–0.4)
Eos: 4 %
Hematocrit: 41.3 % (ref 37.5–51.0)
Hemoglobin: 13.3 g/dL (ref 13.0–17.7)
Lymphocytes Absolute: 1.1 10*3/uL (ref 0.7–3.1)
Lymphs: 5 %
MCH: 29 pg (ref 26.6–33.0)
MCHC: 32.2 g/dL (ref 31.5–35.7)
MCV: 90 fL (ref 79–97)
Monocytes Absolute: 5 10*3/uL — ABNORMAL HIGH (ref 0.1–0.9)
Monocytes: 22 %
Neutrophils Absolute: 15.5 10*3/uL — ABNORMAL HIGH (ref 1.4–7.0)
Neutrophils: 69 %
Platelets: 170 10*3/uL (ref 150–450)
RBC: 4.58 x10E6/uL (ref 4.14–5.80)
RDW: 14.3 % (ref 11.6–15.4)
WBC: 22.5 10*3/uL (ref 3.4–10.8)

## 2020-08-20 LAB — BASIC METABOLIC PANEL
BUN/Creatinine Ratio: 23 (ref 10–24)
BUN: 27 mg/dL (ref 8–27)
CO2: 24 mmol/L (ref 20–29)
Calcium: 9.9 mg/dL (ref 8.6–10.2)
Chloride: 93 mmol/L — ABNORMAL LOW (ref 96–106)
Creatinine, Ser: 1.16 mg/dL (ref 0.76–1.27)
GFR calc Af Amer: 69 mL/min/{1.73_m2} (ref >59)
GFR calc non Af Amer: 60 mL/min/{1.73_m2} (ref >59)
Glucose: 486 mg/dL — ABNORMAL HIGH (ref 65–99)
Potassium: 5.2 mmol/L (ref 3.5–5.2)
Sodium: 125 mmol/L — ABNORMAL LOW (ref 134–144)

## 2020-08-20 LAB — TSH: TSH: 1.79 u[IU]/mL (ref 0.450–4.500)

## 2020-08-20 NOTE — Telephone Encounter (Signed)
-----   Message from Jenean Lindau, MD sent at 08/20/2020  1:12 PM EST ----- Sugars are markedly elevated.  White count is elevated.  He needs to talk to his primary care about it.  Please also call primary care nurse and let them know.  Copy primary care Jenean Lindau, MD 08/20/2020 1:12 PM

## 2020-08-21 DIAGNOSIS — E162 Hypoglycemia, unspecified: Secondary | ICD-10-CM | POA: Diagnosis not present

## 2020-08-23 DIAGNOSIS — R739 Hyperglycemia, unspecified: Secondary | ICD-10-CM | POA: Diagnosis not present

## 2020-08-23 NOTE — Telephone Encounter (Signed)
Pt was sent to Urgent Care on 08/20/20.

## 2020-08-24 DIAGNOSIS — I4891 Unspecified atrial fibrillation: Secondary | ICD-10-CM | POA: Diagnosis not present

## 2020-08-24 DIAGNOSIS — I10 Essential (primary) hypertension: Secondary | ICD-10-CM | POA: Diagnosis not present

## 2020-08-26 LAB — FECAL OCCULT BLOOD, IMMUNOCHEMICAL: Fecal Occult Bld: NEGATIVE

## 2020-08-30 ENCOUNTER — Telehealth: Payer: Self-pay

## 2020-08-30 NOTE — Telephone Encounter (Signed)
-----   Message from Richardo Priest, MD sent at 08/26/2020  1:01 PM EST ----- Stool occult blood is good normal

## 2020-08-30 NOTE — Telephone Encounter (Signed)
Tried calling patient. No answer and no voicemail set up for me to leave a message. 

## 2020-08-31 ENCOUNTER — Telehealth: Payer: Self-pay

## 2020-08-31 NOTE — Telephone Encounter (Signed)
-----   Message from Richardo Priest, MD sent at 08/26/2020  1:01 PM EST ----- Stool occult blood is good normal

## 2020-08-31 NOTE — Telephone Encounter (Signed)
Left message on patients voicemail to please return our call.   I will also mail the patient a letter at this time as we have tried reaching him x3 with no success.

## 2020-09-02 DIAGNOSIS — R739 Hyperglycemia, unspecified: Secondary | ICD-10-CM | POA: Diagnosis not present

## 2020-09-20 ENCOUNTER — Ambulatory Visit: Payer: Medicare Other | Admitting: Cardiology

## 2020-09-24 DIAGNOSIS — D519 Vitamin B12 deficiency anemia, unspecified: Secondary | ICD-10-CM | POA: Diagnosis not present

## 2020-10-20 ENCOUNTER — Telehealth: Payer: Self-pay | Admitting: Radiation Oncology

## 2020-10-20 DIAGNOSIS — D519 Vitamin B12 deficiency anemia, unspecified: Secondary | ICD-10-CM | POA: Diagnosis not present

## 2020-10-20 NOTE — Telephone Encounter (Signed)
Called pt to r/s his 2/21 f/u appt with Dr. Sondra Come. No answer, LVM.

## 2020-10-21 NOTE — Telephone Encounter (Signed)
Patient was called for rescheduling, no answer. Unable to LVM.

## 2020-10-22 ENCOUNTER — Other Ambulatory Visit: Payer: Self-pay

## 2020-10-22 ENCOUNTER — Ambulatory Visit (HOSPITAL_COMMUNITY)
Admission: RE | Admit: 2020-10-22 | Discharge: 2020-10-22 | Disposition: A | Discharge status: Home / Self Care | Payer: Medicare Other | Source: Ambulatory Visit | Attending: Radiation Oncology | Admitting: Radiation Oncology

## 2020-10-22 ENCOUNTER — Encounter (HOSPITAL_COMMUNITY): Payer: Self-pay

## 2020-10-22 DIAGNOSIS — K449 Diaphragmatic hernia without obstruction or gangrene: Secondary | ICD-10-CM | POA: Diagnosis not present

## 2020-10-22 DIAGNOSIS — M47814 Spondylosis without myelopathy or radiculopathy, thoracic region: Secondary | ICD-10-CM | POA: Diagnosis not present

## 2020-10-22 DIAGNOSIS — C3491 Malignant neoplasm of unspecified part of right bronchus or lung: Secondary | ICD-10-CM | POA: Insufficient documentation

## 2020-10-22 DIAGNOSIS — I700 Atherosclerosis of aorta: Secondary | ICD-10-CM | POA: Diagnosis not present

## 2020-10-22 DIAGNOSIS — C3490 Malignant neoplasm of unspecified part of unspecified bronchus or lung: Secondary | ICD-10-CM | POA: Diagnosis not present

## 2020-10-22 LAB — POCT I-STAT CREATININE: Creatinine, Ser: 0.9 mg/dL (ref 0.61–1.24)

## 2020-10-22 MED ORDER — IOHEXOL 300 MG/ML  SOLN
75.0000 mL | Freq: Once | INTRAMUSCULAR | Status: AC | PRN
  Administered 2020-10-22: 75 mL via INTRAVENOUS

## 2020-10-24 NOTE — Progress Notes (Signed)
Radiation Oncology         [phone] ________________________________  Name: Christopher Rocha MRN: [id number]  Date: 10/25/2020  DOB: 04-09-1941  Follow-Up Visit Note  CC: [name], MD  [name], MD    ICD-10-CM   1. Adenocarcinoma, lung, right ([place])  C34.91 CT CHEST WO CONTRAST    Diagnosis: Stage IIA (T2b, n0, m0) Adenocarcinoma presenting in the right upper lobe  Interval Since Last Radiation: One year, four months, two weeks, and one day  06/03/2019 through 06/10/2019 Site Technique Total Dose Dose Christopher Fx Completed Fx Beam Energies  Th[place]ax: Lung_Rt IMRT 54/54 18 3/3 6XFFF    Narrative:  The patient returns today for routine follow-up. Since his last visit, he followed-up with [name] on 07/14/2020, at which time his iron and hemoglobin levels were stable.  Chest CT scan on 10/22/2020 showed a mild decrease in the size of the mass-like architectural distortion within the right upper lung. There was interval resolution of the previous non-solid nodule int he medial right lower lobe and right middle lobe. Additionally, there was interval improvement in scattered nodular densities within both lower lung zones that was compatible with resolving pneumonia or aspiration.  On review of systems, he reports some skin issues and is seeing a dermatologist for this issue.  He has lost some weight due to his wife being diagnosed with leukemia and having a transfer more frequently for those trips.  She is being treated at [facility]. He denies chest pain shortness of breath cough or hemoptysis.  ALLERGIES:  is allergic to penicillins.  Meds: Current Outpatient Medications  Medication Sig Dispense Refill  . acetaminophen (TYLENOL) 500 MG tablet Take 500 mg by mouth every 8 (eight) hours as needed (pain).    Marland Kitchen aspirin EC 81 MG tablet Take 81 mg by mouth every other day.     . budesonide-formoterol (SYMBICORT) 160-4.5 MCG/ACT inhaler Inhale 2 puffs into the lungs 2  (two) times daily. 1 Inhaler 5  . glyBURIDE (DIABETA) 1.25 MG tablet Take 1.25 mg by mouth daily.    Marland Kitchen ibuprofen (ADVIL) 200 MG tablet Take 200 mg by mouth every 6 (six) hours as needed.    Marland Kitchen losartan-hydrochlorothiazide (HYZAAR) 100-12.5 MG tablet Take 1 tablet by mouth daily.    . Naphazoline-Glycerin (REDNESS RELIEF OP) Place 1 drop into both eyes daily as needed (for redness/itching).    Marland Kitchen PROAIR HFA 108 (90 Base) MCG/ACT inhaler Inhale 2 puffs into the lungs every 4 (four) hours as needed for shortness of breath.    . rosuvastatin (CRESTOR) 10 MG tablet Take 10 mg by mouth daily.    Marland Kitchen triamcinolone (KENALOG) 0.1 % Apply 1 application topically. 2-3 times daily    . apixaban (ELIQUIS) 5 MG TABS tablet Take 1 tablet (5 mg total) by mouth 2 (two) times daily. (Patient not taking: Reported on 10/25/2020) 60 tablet 12   No current facility-administered medications for this encounter.    Physical Findings: The patient is in no acute distress. Patient is alert and oriented. Lungs are clear to auscultation bilaterally. Heart has regular rate and rhythm. No palpable cervical, supraclavicular, or axillary adenopathy. Abdomen soft, non-tender, normal bowel sounds.    height is 5\' 11"  (1.803 m) and weight is 177 lb 3.2 oz (80.4 kg). His temperature is 97.7 F (36.5 C). His blood pressure is 172/84 (abnormal) and his pulse is 66. His respiration is 18 and oxygen saturation is 100%.   Lab Findings:  Lab Results  Component Value Date   WBC 22.5 (HH) 08/19/2020   HGB 13.3 08/19/2020   HCT 41.3 08/19/2020   MCV 90 08/19/2020   PLT 170 08/19/2020    Radiographic Findings: CT Chest W Contrast  Result Date: 10/23/2020 CLINICAL DATA:  Restaging non-small cell lung cancer. EXAM: CT CHEST WITH CONTRAST TECHNIQUE: Multidetector CT imaging of the chest was performed during intravenous contrast administration. CONTRAST:  43mL OMNIPAQUE IOHEXOL 300 MG/ML  SOLN COMPARISON:  04/15/2020 FINDINGS:  Cardiovascular: Heart size appears within normal limits. Previous CABG and aortic valve replacement. Aortic atherosclerosis. Mediastinum/Nodes: Normal appearance of the thyroid gland. The trachea appears patent and is midline. Normal appearance of the esophagus. No enlarged axillary, supraclavicular, mediastinal, or hilar lymph nodes. Lungs/Pleura: Masslike architectural distortion within the right upper lung measures 6.0 x 3.5 by 4.3 cm (volume = 47 cm^3). Previously this measured 5.9 x 3.8 by 4.6 cm (volume = 54 cm^3). Previous non solid nodule in the medial right lower lobe has resolved in the interval. Focal area of ground-glass attenuation within the right middle lobe has also resolved in the interval. Multiple scattered nodular densities within both lower lung zones have improved in the interval compatible with resolving pneumonia or aspiration. No new or progressive findings identified bilaterally. Upper Abdomen: No acute abnormality. Kidney cysts are again noted. Small hiatal hernia. Musculoskeletal: Previous median sternotomy. Spondylosis identified within the thoracic spine. No acute or suspicious osseous lesions. IMPRESSION: 1. Mild decrease in size of masslike architectural distortion within the right upper lung. 2. Interval resolution of previous non solid nodule in the medial right lower lobe and right middle lobe. 3. Interval improvement in scattered nodular densities within both lower lung zones compatible with resolving pneumonia or aspiration. 4. Aortic atherosclerosis. Previous CABG and aortic valve replacement. 5. Small hiatal hernia. Aortic Atherosclerosis (ICD10-I70.0). Electronically Signed   By: Kerby Moors M.D.   On: 10/23/2020 09:47    Impression: Stage IIA (T2b, n0, m0) Adenocarcinoma presenting in the right upper lobe  No clinical evidence of recurrence today. Recent chest CT scan showed a mild decrease in the size of the mass-like architectural distortion within the right upper  lung and interval resolution of the previous non-solid nodule in the medial right lower lobe and right middle lobe.  Plan: The patient will follow-up with radiation oncology in six months. He will undergo a repeat chest CT scan just prior to that visit.  Total time spent in this encounter was 20 minutes which included reviewing the patient's most recent follow-up with [name], chest CT scan, physical examination, ordering of future chest CT scan, and documentation. ____________________________________   Blair Promise, PhD, MD  This document serves as a record of services personally performed by Gery Pray, MD. It was created on his behalf by Clerance Lav, a trained medical scribe. The creation of this record is based on the scribe's personal observations and the provider's statements to them. This document has been checked and approved by the attending provider.

## 2020-10-25 ENCOUNTER — Ambulatory Visit: Payer: Medicare Other | Admitting: Radiation Oncology

## 2020-10-25 ENCOUNTER — Ambulatory Visit: Payer: Self-pay | Admitting: Radiation Oncology

## 2020-10-25 ENCOUNTER — Encounter: Payer: Self-pay | Admitting: Radiation Oncology

## 2020-10-25 ENCOUNTER — Other Ambulatory Visit: Payer: Self-pay

## 2020-10-25 ENCOUNTER — Ambulatory Visit
Admission: RE | Admit: 2020-10-25 | Discharge: 2020-10-25 | Disposition: A | Discharge status: Home / Self Care | Payer: Medicare Other | Source: Ambulatory Visit | Attending: Radiation Oncology | Admitting: Radiation Oncology

## 2020-10-25 VITALS — BP 172/84 | HR 66 | Temp 97.7°F | Resp 18 | Ht 71.0 in | Wt 177.2 lb

## 2020-10-25 DIAGNOSIS — C3491 Malignant neoplasm of unspecified part of right bronchus or lung: Secondary | ICD-10-CM

## 2020-10-25 DIAGNOSIS — K449 Diaphragmatic hernia without obstruction or gangrene: Secondary | ICD-10-CM | POA: Insufficient documentation

## 2020-10-25 DIAGNOSIS — N281 Cyst of kidney, acquired: Secondary | ICD-10-CM | POA: Diagnosis not present

## 2020-10-25 DIAGNOSIS — Z85118 Personal history of other malignant neoplasm of bronchus and lung: Secondary | ICD-10-CM | POA: Diagnosis not present

## 2020-10-25 DIAGNOSIS — C3411 Malignant neoplasm of upper lobe, right bronchus or lung: Secondary | ICD-10-CM | POA: Diagnosis not present

## 2020-10-25 DIAGNOSIS — I700 Atherosclerosis of aorta: Secondary | ICD-10-CM | POA: Diagnosis not present

## 2020-10-25 DIAGNOSIS — Z08 Encounter for follow-up examination after completed treatment for malignant neoplasm: Secondary | ICD-10-CM | POA: Diagnosis not present

## 2020-10-25 DIAGNOSIS — Z7982 Long term (current) use of aspirin: Secondary | ICD-10-CM | POA: Insufficient documentation

## 2020-10-25 DIAGNOSIS — J984 Other disorders of lung: Secondary | ICD-10-CM | POA: Insufficient documentation

## 2020-10-25 DIAGNOSIS — Z79899 Other long term (current) drug therapy: Secondary | ICD-10-CM | POA: Insufficient documentation

## 2020-10-25 DIAGNOSIS — Z923 Personal history of irradiation: Secondary | ICD-10-CM | POA: Diagnosis not present

## 2020-10-25 DIAGNOSIS — Z7901 Long term (current) use of anticoagulants: Secondary | ICD-10-CM | POA: Diagnosis not present

## 2020-10-25 NOTE — Progress Notes (Signed)
Patient in for follow up complains of cough with fluffy white secretions. States he was told he had bronchitis but was not treated. Wife has been diagnosed with leukemia. He has been told that he needs a booster Covid shot. He states that when he lies on his side or back his skin will burn and hurt. Has been to dermatologist but nothing is working. He denies any other pain at this time.  Filed Weights   10/25/20 1424  Weight: 177 lb 3.2 oz (80.4 kg)   BP (!) 172/84 (BP Location: Left Arm, Patient Position: Sitting, Cuff Size: Normal)   Pulse 66   Temp 97.7 F (36.5 C)   Resp 18   Ht 5\' 11"  (1.803 m)   Wt 177 lb 3.2 oz (80.4 kg)   SpO2 100%   BMI 24.71 kg/m

## 2020-11-16 DIAGNOSIS — D519 Vitamin B12 deficiency anemia, unspecified: Secondary | ICD-10-CM | POA: Diagnosis not present

## 2020-11-19 ENCOUNTER — Other Ambulatory Visit: Payer: Self-pay | Admitting: Cardiology

## 2021-01-10 NOTE — Progress Notes (Signed)
[facility]  [address] [place],  [place]  [address] [phone]  Clinic Day:  01/12/2021  Referring physician: [name], MD  This document serves as a record of services personally performed by Dequincy Macarthur Critchley, MD. It was created on their behalf by [facility], a trained medical scribe. The creation of this record is based on the scribe's personal observations and the provider's statements to them.  HISTORY OF PRESENT ILLNESS:  Christopher Rocha is a 80 y.o. male with a history of iron deficiency anemia, which previously improved after he received IV iron.  However, more importantly, this gentleman had a right upper lobe lung mass that was biopsy proven in September 2020 to be adenocarcinoma.  Staging ultimately showed him to have stage IIA (T2b N0 M0) disease.  The patient completed stereotactic radiation to this lesion in November 2020.  His last CT scan in February 2022 have shown him to be cancer free.  He comes in today for routine follow-up.  Since his last visit, the patient has been doing well.  He denies having increased fatigue or any overt forms of blood loss which concern him for progressive anemia.  With respect to his lung cancer, he denies having any respiratory symptoms that concern him for progression of his lung cancer.   VITALS:  Blood pressure (!) 237/103, pulse 71, temperature 97.7 F (36.5 C), resp. rate 18, height 5\' 11"  (1.803 m), weight 189 lb 3.2 oz (85.8 kg), SpO2 94 %.  Wt Readings from Last 3 Encounters:  01/12/21 189 lb 3.2 oz (85.8 kg)  10/25/20 177 lb 3.2 oz (80.4 kg)  08/19/20 172 lb 3.2 oz (78.1 kg)    Body mass index is 26.39 kg/m.  Performance status (ECOG): 0 - Asymptomatic  PHYSICAL EXAM:  Physical Exam Constitutional:      General: He is not in acute distress.    Appearance: Normal appearance. He is normal weight.  HENT:     Head: Normocephalic and atraumatic.  Eyes:     General: No scleral  icterus.    Extraocular Movements: Extraocular movements intact.     Conjunctiva/sclera: Conjunctivae normal.     Pupils: Pupils are equal, round, and reactive to light.  Cardiovascular:     Rate and Rhythm: Normal rate and regular rhythm.     Pulses: Normal pulses.     Heart sounds: Normal heart sounds. No murmur heard. No friction rub. No gallop.   Pulmonary:     Effort: Pulmonary effort is normal. No respiratory distress.     Breath sounds: Normal breath sounds.  Abdominal:     General: Bowel sounds are normal. There is no distension.     Palpations: Abdomen is soft. There is no hepatomegaly, splenomegaly or mass.     Tenderness: There is no abdominal tenderness.  Musculoskeletal:        General: Normal range of motion.     Cervical back: Normal range of motion and neck supple.     Right lower leg: No edema.     Left lower leg: No edema.  Lymphadenopathy:     Cervical: No cervical adenopathy.  Skin:    General: Skin is warm and dry.  Neurological:     General: No focal deficit present.     Mental Status: He is alert and oriented to person, place, and time. Mental status is at baseline.  Psychiatric:        Mood and Affect: Mood normal.  Behavior: Behavior normal.        Thought Content: Thought content normal.        Judgment: Judgment normal.     LABS:   CBC Latest Ref Rng & Units 01/12/2021 08/19/2020 07/14/2020  WBC - 9.8 22.5(HH) 11.7  Hemoglobin 13.5 - 17.5 12.8(A) 13.3 11.2(A)  Hematocrit 41 - 53 39(A) 41.3 35(A)  Platelets 150 - 399 177 170 267   CMP Latest Ref Rng & Units 01/12/2021 10/22/2020 08/19/2020  Glucose 65 - 99 mg/dL - - 486(H)  BUN 4 - 21 21 - 27  Creatinine 0.6 - 1.3 0.9 0.90 1.16  Sodium 137 - 147 137 - 125(L)  Potassium 3.4 - 5.3 4.1 - 5.2  Chloride 99 - 108 101 - 93(L)  CO2 13 - 22 31(A) - 24  Calcium 8.7 - 10.7 9.5 - 9.9  Total Protein 6.0 - 8.5 g/dL - - -  Total Bilirubin 0.0 - 1.2 mg/dL - - -  Alkaline Phos 25 - 125 96 - -  AST  14 - 40 21 - -  ALT 10 - 40 16 - -    ASSESSMENT & PLAN:  A 80 year old gentleman with stage IIA lung adenocarcinoma, status post stereotactic radiation in November 2020.  He also has a history of iron deficiency anemia.  Overall, I am pleased as his hemoglobin has generally improved without any recent particular intervention.  With respect to his cancer, he will continue to be followed by radiation oncology with scans to ensure there is no radiographic evidence of disease recurrence.  From a hematologic standpoint, the patient is doing well.  As that is the case, I will see him back in 6 months for repeat clinical assessment.  The patient understands all the plans discussed today and is in agreement with them.   I, Rita Ohara, am acting as scribe for Marice Potter, MD    I have reviewed this report as typed by the medical scribe, and it is complete and accurate.  Dequincy Macarthur Critchley, MD

## 2021-01-12 ENCOUNTER — Inpatient Hospital Stay (INDEPENDENT_AMBULATORY_CARE_PROVIDER_SITE_OTHER): Payer: Medicare Other | Admitting: Oncology

## 2021-01-12 ENCOUNTER — Inpatient Hospital Stay: Payer: Medicare Other | Attending: Oncology

## 2021-01-12 ENCOUNTER — Encounter: Payer: Self-pay | Admitting: Oncology

## 2021-01-12 ENCOUNTER — Telehealth: Payer: Self-pay | Admitting: Oncology

## 2021-01-12 ENCOUNTER — Other Ambulatory Visit: Payer: Self-pay

## 2021-01-12 ENCOUNTER — Other Ambulatory Visit: Payer: Self-pay | Admitting: Oncology

## 2021-01-12 DIAGNOSIS — D509 Iron deficiency anemia, unspecified: Secondary | ICD-10-CM | POA: Insufficient documentation

## 2021-01-12 DIAGNOSIS — D508 Other iron deficiency anemias: Secondary | ICD-10-CM

## 2021-01-12 DIAGNOSIS — D649 Anemia, unspecified: Secondary | ICD-10-CM | POA: Diagnosis not present

## 2021-01-12 HISTORY — DX: Iron deficiency anemia, unspecified: D50.9

## 2021-01-12 LAB — CBC AND DIFFERENTIAL
HCT: 39 — AB (ref 41–53)
Hemoglobin: 12.8 — AB (ref 13.5–17.5)
Neutrophils Absolute: 6.27
Platelets: 177 (ref 150–399)
WBC: 9.8

## 2021-01-12 LAB — COMPREHENSIVE METABOLIC PANEL
Albumin: 4.2 (ref 3.5–5.0)
Calcium: 9.5 (ref 8.7–10.7)

## 2021-01-12 LAB — BASIC METABOLIC PANEL
BUN: 21 (ref 4–21)
CO2: 31 — AB (ref 13–22)
Chloride: 101 (ref 99–108)
Creatinine: 0.9 (ref 0.6–1.3)
Glucose: 142
Potassium: 4.1 (ref 3.4–5.3)
Sodium: 137 (ref 137–147)

## 2021-01-12 LAB — HEPATIC FUNCTION PANEL
ALT: 16 (ref 10–40)
AST: 21 (ref 14–40)
Alkaline Phosphatase: 96 (ref 25–125)
Bilirubin, Total: 0.6

## 2021-01-12 LAB — IRON AND TIBC
Iron: 82 ug/dL (ref 45–182)
Saturation Ratios: 21 % (ref 17.9–39.5)
TIBC: 392 ug/dL (ref 250–450)
UIBC: 310 ug/dL

## 2021-01-12 LAB — FERRITIN: Ferritin: 53 ng/mL (ref 24–336)

## 2021-01-12 LAB — CBC: RBC: 4.35 (ref 3.87–5.11)

## 2021-01-12 NOTE — Telephone Encounter (Signed)
Per 5/11 los next appt scheduled and given to patient

## 2021-01-20 DIAGNOSIS — D519 Vitamin B12 deficiency anemia, unspecified: Secondary | ICD-10-CM | POA: Diagnosis not present

## 2021-02-22 DIAGNOSIS — D519 Vitamin B12 deficiency anemia, unspecified: Secondary | ICD-10-CM | POA: Diagnosis not present

## 2021-03-21 DIAGNOSIS — D510 Vitamin B12 deficiency anemia due to intrinsic factor deficiency: Secondary | ICD-10-CM | POA: Diagnosis not present

## 2021-04-12 ENCOUNTER — Telehealth: Payer: Self-pay | Admitting: *Deleted

## 2021-04-12 NOTE — Telephone Encounter (Signed)
CALLED PATIENT'S WIFE- DONNETT TO INFORM OF CT FOR 04-22-21- ARRIVAL TIME- 12:15 PM, NO RESTRICTIONS TO TEST, PATIENT TO RECEIVE RESULTS FROM DR. KINARD ON 04-25-21 @ 3 PM, LVM FOR A RETURN CALL

## 2021-04-20 ENCOUNTER — Encounter: Payer: Self-pay | Admitting: Radiology

## 2021-04-22 ENCOUNTER — Telehealth: Payer: Self-pay | Admitting: *Deleted

## 2021-04-22 ENCOUNTER — Ambulatory Visit (HOSPITAL_COMMUNITY): Payer: Medicare Other

## 2021-04-22 DIAGNOSIS — D519 Vitamin B12 deficiency anemia, unspecified: Secondary | ICD-10-CM | POA: Diagnosis not present

## 2021-04-22 NOTE — Telephone Encounter (Signed)
CALLED PATIENT'S DAUGHTER (KEISHA BRADY) TO ASK ABOUT RESCHEDULING MISSED CT AN FU), SPOKE WITH PATIENT'S DAUGHTER KEISHA BRADY AND SHE WILL SPEAK TO HER DAD AND HE WILL CALL ME ABOUT RESCHEDULING THESE APPTS.

## 2021-04-25 ENCOUNTER — Telehealth: Payer: Self-pay | Admitting: *Deleted

## 2021-04-25 ENCOUNTER — Ambulatory Visit: Payer: Medicare Other | Admitting: Radiation Oncology

## 2021-04-25 NOTE — Telephone Encounter (Signed)
CALLED PATIENT TO INFORM OF CT FOR 05-02-21- ARRIVAL TIME- 3:45 PM @ [facility], NO RESTRICTIONS TO TEST, PATIENT TO RECEIVE CT RESULTS FROM DR. KINARD ON 05-05-21 @ 9:45 AM, SPOKE WITH  PATIENT AND HE VERIFIED UNDERSTANDING THESE APPTS.

## 2021-05-02 ENCOUNTER — Ambulatory Visit (HOSPITAL_COMMUNITY)
Admission: RE | Admit: 2021-05-02 | Discharge: 2021-05-02 | Disposition: A | Discharge status: Home / Self Care | Payer: Medicare Other | Source: Ambulatory Visit | Attending: Radiation Oncology | Admitting: Radiation Oncology

## 2021-05-02 ENCOUNTER — Other Ambulatory Visit: Payer: Self-pay

## 2021-05-02 DIAGNOSIS — J479 Bronchiectasis, uncomplicated: Secondary | ICD-10-CM | POA: Diagnosis not present

## 2021-05-02 DIAGNOSIS — C3491 Malignant neoplasm of unspecified part of right bronchus or lung: Secondary | ICD-10-CM | POA: Diagnosis not present

## 2021-05-02 DIAGNOSIS — C3490 Malignant neoplasm of unspecified part of unspecified bronchus or lung: Secondary | ICD-10-CM | POA: Diagnosis not present

## 2021-05-02 DIAGNOSIS — I700 Atherosclerosis of aorta: Secondary | ICD-10-CM | POA: Diagnosis not present

## 2021-05-02 DIAGNOSIS — J439 Emphysema, unspecified: Secondary | ICD-10-CM | POA: Diagnosis not present

## 2021-05-02 DIAGNOSIS — J9811 Atelectasis: Secondary | ICD-10-CM | POA: Diagnosis not present

## 2021-05-02 IMAGING — CT CT CHEST W/O CM
2 of 3 series · 15 of 36 positions shown, 18 images · non-contrast
Comparison: [DATE].

CLINICAL DATA: Non-small cell lung cancer, radiation therapy
complete.

EXAM:
CT CHEST WITHOUT CONTRAST
TECHNIQUE: Multidetector CT imaging of the chest was performed following the
standard protocol without IV contrast.

[Series 2: thorax · axial · 0.73mm/px · z∈[+1367,+1627]mm · 12 of 154 slices shown, 15 images]
[im 12/154  mediastinal]
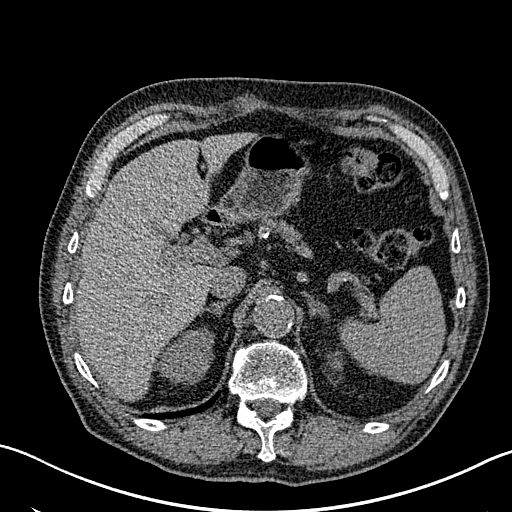
[im 12/154  lung]
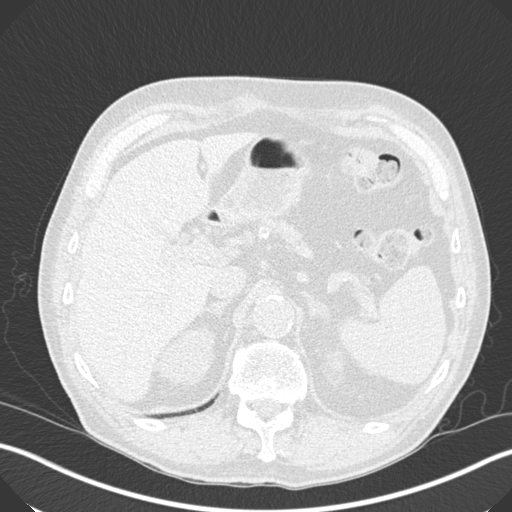
[im 23/154  lung]
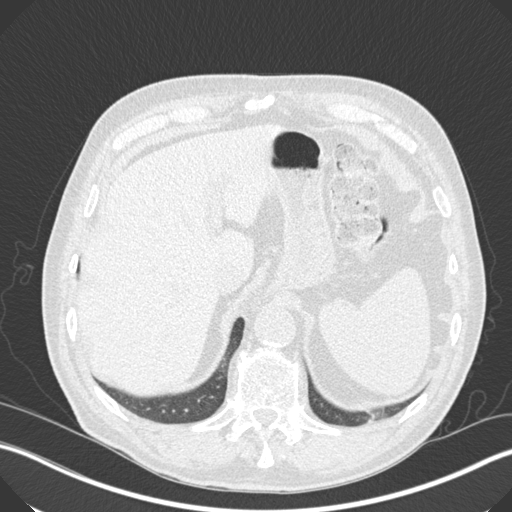
[im 35/154  lung]
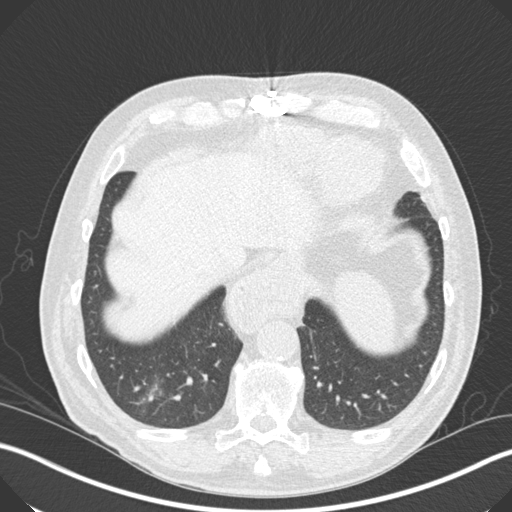
[im 46/154  lung]
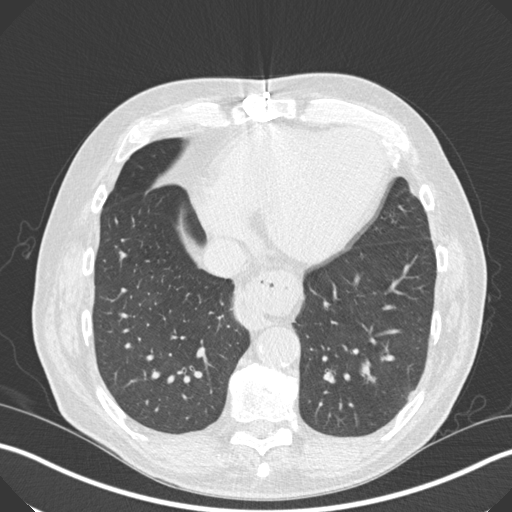
[im 57/154  mediastinal]
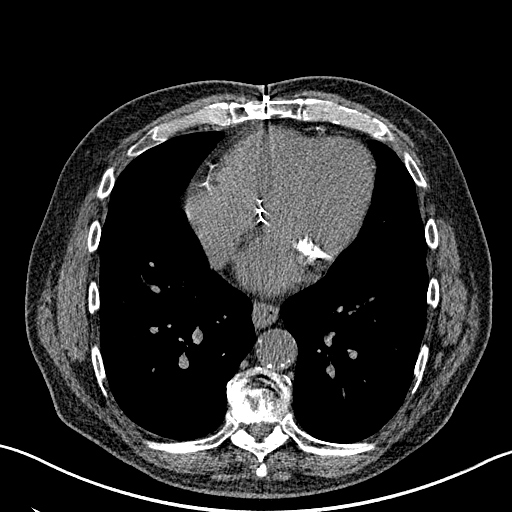
[im 57/154  lung]
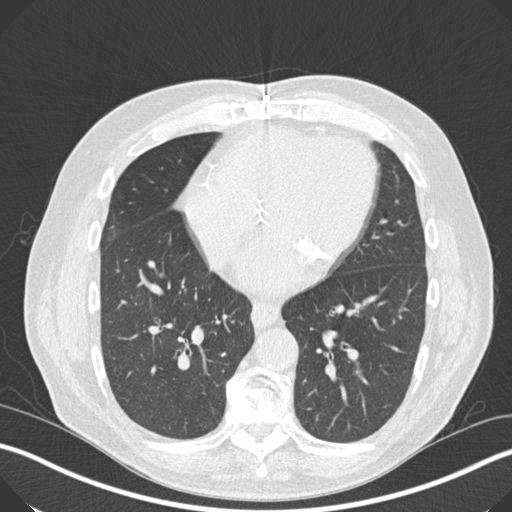
[im 69/154  lung]
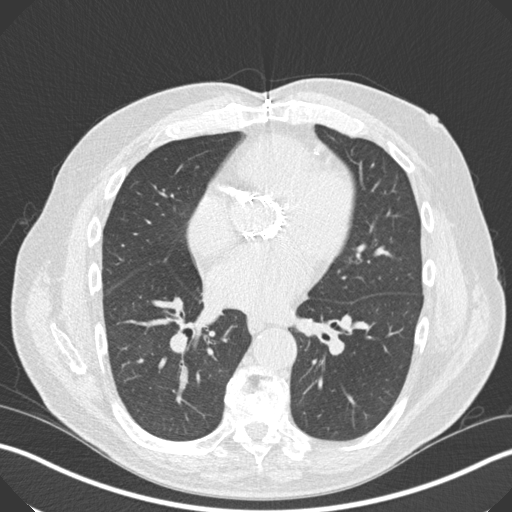
[im 86/154  lung]
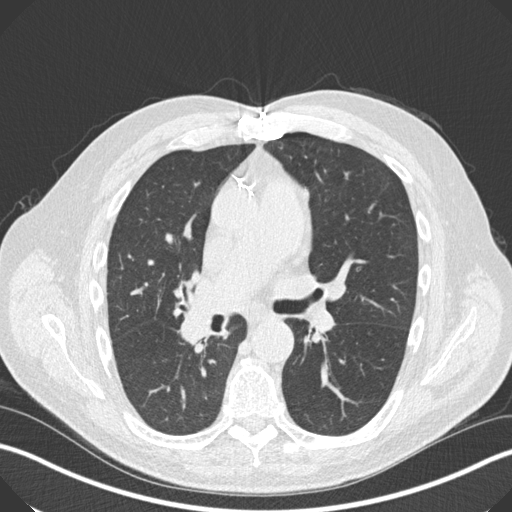
[im 97/154  lung]
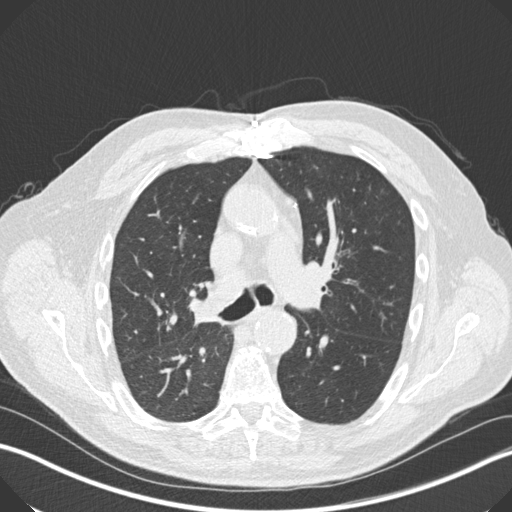
[im 108/154  mediastinal]
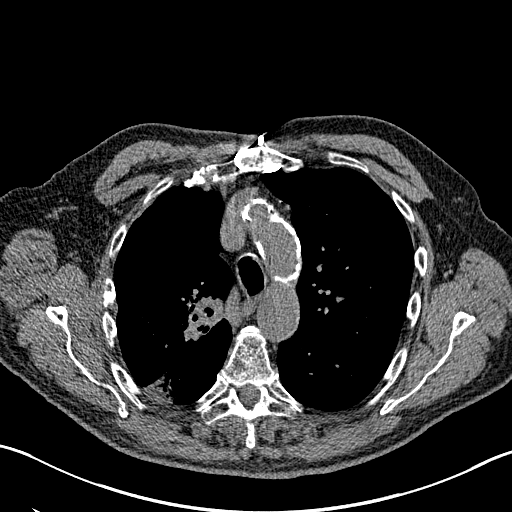
[im 108/154  lung]
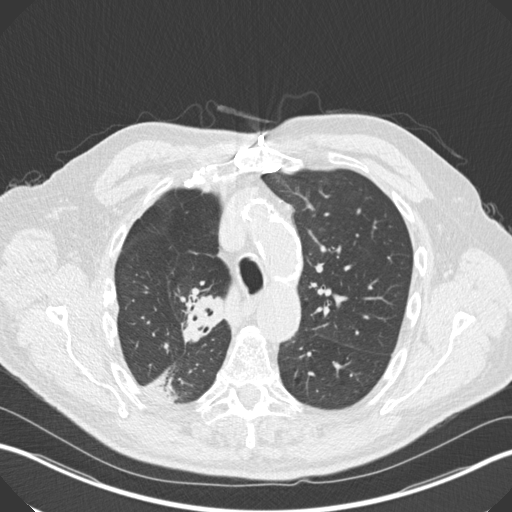
[im 120/154  lung]
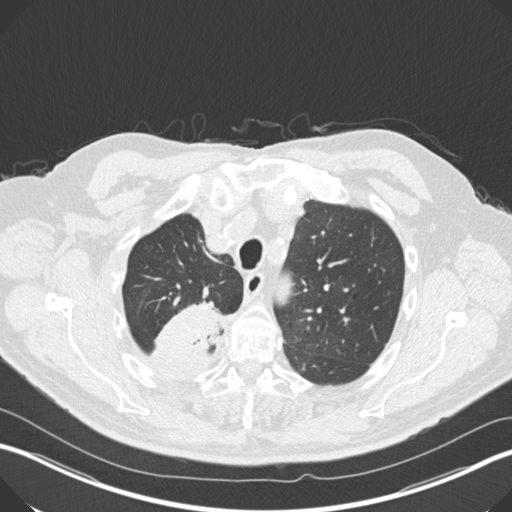
[im 131/154  lung]
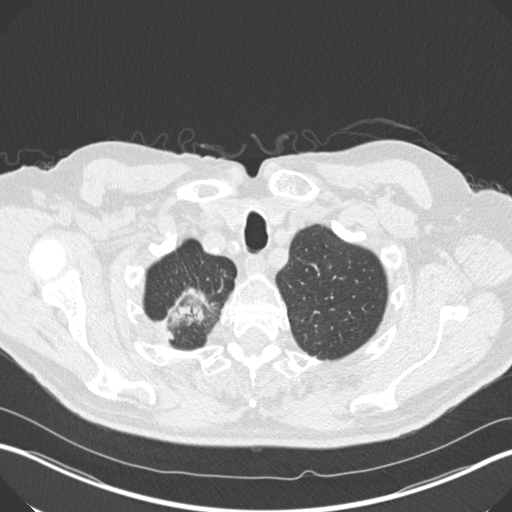
[im 142/154  lung]
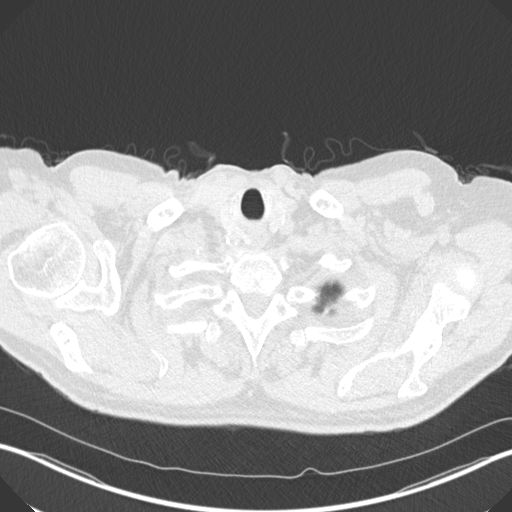

[Series 6: coronal · coronal · 0.62mm/px · 3 of 158 slices shown]
[im 32/158  lung]
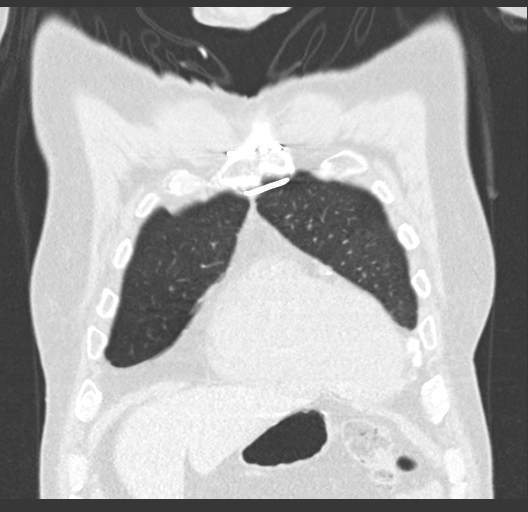
[im 63/158  lung]
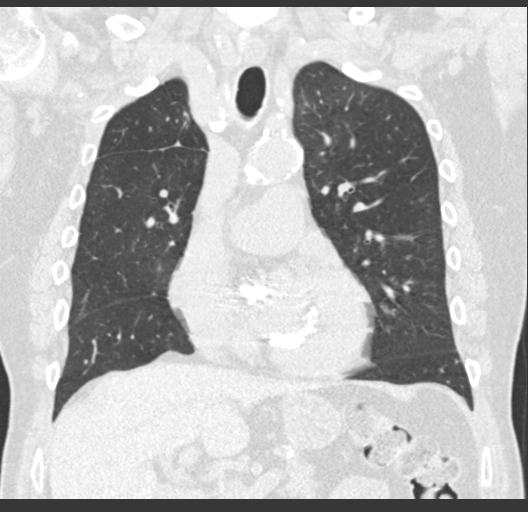
[im 95/158  lung]
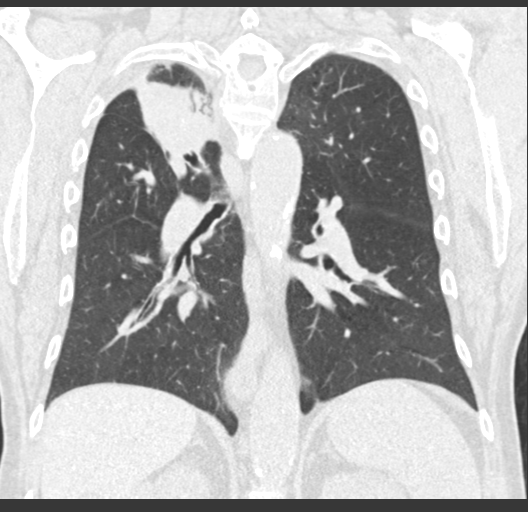

[15 of 36 positions shown; findings below may reference images not displayed]

FINDINGS: Cardiovascular: Atherosclerotic calcification of the aorta. Pulmonic
trunk and heart are enlarged. Aortic valve replacement. Dense mitral
annulus calcification. No pericardial effusion.

Mediastinum/Nodes: Subcentimeter low-attenuation lesion in the left
thyroid. No follow-up recommended. (Ref: [HOSPITAL]. [DATE]): 143-50).No pathologically enlarged mediastinal or
axillary lymph nodes. Hilar regions are difficult to evaluate
without IV contrast. Esophagus is grossly unremarkable. Moderate
hiatal hernia.

Lungs/Pleura: Post radiation consolidation, bronchiectasis and
volume loss involving the majority of the right upper lobe,
unchanged. Minimal involvement of the superior segment right lower
lobe, as before. There are vague areas of ground-glass in the right
lower lobe (4/118) and left lower lobe (4/90), new from [DATE].
Mild centrilobular emphysema. Probable subpleural atelectasis in the
posterior left lower lobe (4/103-110), new. No pleural fluid.
Adherent debris in the airway.

Upper Abdomen: Vague 1.4 cm low-attenuation lesion in the dome of
the left hepatic lobe (2/122), corresponds to hyperattenuation on
[DATE] but is too small to characterize. Visualized portions of
the liver, gallbladder adrenal glands are otherwise unremarkable.
Small stone in the right kidney. Low-attenuation lesions in the left
kidney measure up to 3.6 cm off the upper pole, incompletely
visualized. Visualized portions of spleen, pancreas, stomach and
bowel are unremarkable with the exception of a moderate hiatal
hernia. No upper abdominal adenopathy.

Musculoskeletal: Degenerative changes in the spine. Old lateral
right rib fractures. No worrisome lytic or sclerotic lesions.
IMPRESSION: 1. Post radiation parenchymal consolidation in the upper right
hemithorax, unchanged.
2. Two new areas of a ground-glass in the lower lobes, possibly
infectious/inflammatory in etiology. Consider short-term follow-up
in 4-6 weeks, as clinically indicated.
3. Moderate hiatal hernia.
4. Right renal stone.
5.  Aortic atherosclerosis ([0N]-[0N]).
6. Enlarged pulmonic trunk, indicative of pulmonary arterial
hypertension.
7.  Emphysema ([0N]-[0N]).

## 2021-05-04 NOTE — Progress Notes (Signed)
Radiation Oncology         [phone] ________________________________  Name: Christopher Rocha MRN: [id number]  Date: 05/05/2021  DOB: 1941/04/20  Follow-Up Visit Note  CC: [name], MD  [name], MD    ICD-10-CM   1. Adenocarcinoma, lung, right ([place])  C34.91 CT CHEST WO CONTRAST      Diagnosis:  Stage IIA (T2b, n0, m0) Adenocarcinoma presenting in the right upper lobe  Interval Since Last Radiation:  1 year, 10 months, and 26 days    Radiation Treatment Dates: 06/03/2019 through 06/10/2019 Site Technique Total Dose Dose per Fx Completed Fx Beam Energies  Thorax: Lung_Rt IMRT 54/54 18 3/3 6XFFF   Narrative:  The patient returns today for routine follow-up, he was last seen by me on 10/25/20. Recent imaging includes a CT of the chest performed on 05/02/21 demonstrating the unchanged appearance of the post radiation parenchymal  consolidation in the upper right hemithorax. Two new areas of ground glass were seen in the lower lobes; noted as possibly infections or inflammatory in etiology. A moderate hiatal hernia was also seen.     Patient informed me that his wife is in hospice.  She was diagnosed with leukemia and has failed chemotherapy.  He is saddened by this issue.                            Allergies:  is allergic to penicillins.  Meds: Current Outpatient Medications  Medication Sig Dispense Refill   acetaminophen (TYLENOL) 500 MG tablet Take 500 mg by mouth every 8 (eight) hours as needed (pain).     apixaban (ELIQUIS) 5 MG TABS tablet Take 1 tablet (5 mg total) by mouth 2 (two) times daily. 60 tablet 12   aspirin EC 81 MG tablet Take 81 mg by mouth every other day.      budesonide-formoterol (SYMBICORT) 160-4.5 MCG/ACT inhaler Inhale 2 puffs into the lungs 2 (two) times daily. 1 Inhaler 5   glyBURIDE (DIABETA) 1.25 MG tablet Take 1.25 mg by mouth daily.     ibuprofen (ADVIL) 200 MG tablet Take 200 mg by mouth every 6 (six) hours as needed.      losartan-hydrochlorothiazide (HYZAAR) 100-12.5 MG tablet Take 1 tablet by mouth daily.     Naphazoline-Glycerin (REDNESS RELIEF OP) Place 1 drop into both eyes daily as needed (for redness/itching).     PROAIR HFA 108 (90 Base) MCG/ACT inhaler Inhale 2 puffs into the lungs every 4 (four) hours as needed for shortness of breath.     rosuvastatin (CRESTOR) 10 MG tablet Take 1 tablet by mouth once daily 90 tablet 2   triamcinolone (KENALOG) 0.1 % Apply 1 application topically. 2-3 times daily     No current facility-administered medications for this encounter.    Physical Findings: The patient is in no acute distress. Patient is alert and oriented.  height is 5\' 11"  (1.803 m) and weight is 190 lb 8 oz (86.4 kg). His temporal temperature is 97.7 F (36.5 C). His blood pressure is 213/73 (abnormal) and his pulse is 55 (abnormal). His respiration is 18 and oxygen saturation is 98%. .  No significant changes. Lungs are clear to auscultation bilaterally. Heart has regular rate and rhythm. No palpable cervical, supraclavicular, or axillary adenopathy. Abdomen soft, non-tender, normal bowel sounds.    Lab Findings: Lab Results  Component Value Date   WBC 9.8 01/12/2021   HGB 12.8 (A) 01/12/2021  HCT 39 (A) 01/12/2021   MCV 90 08/19/2020   PLT 177 01/12/2021    Radiographic Findings: CT CHEST WO CONTRAST  Result Date: 05/03/2021 CLINICAL DATA:  Non-small cell lung cancer, radiation therapy complete. EXAM: CT CHEST WITHOUT CONTRAST TECHNIQUE: Multidetector CT imaging of the chest was performed following the standard protocol without IV contrast. COMPARISON:  10/22/2020. FINDINGS: Cardiovascular: Atherosclerotic calcification of the aorta. Pulmonic trunk and heart are enlarged. Aortic valve replacement. Dense mitral annulus calcification. No pericardial effusion. Mediastinum/Nodes: Subcentimeter low-attenuation lesion in the left thyroid. No follow-up recommended. (Ref: J Am Coll Radiol. 2015  Feb;12(2): 143-50).No pathologically enlarged mediastinal or axillary lymph nodes. Hilar regions are difficult to evaluate without IV contrast. Esophagus is grossly unremarkable. Moderate hiatal hernia. Lungs/Pleura: Post radiation consolidation, bronchiectasis and volume loss involving the majority of the right upper lobe, unchanged. Minimal involvement of the superior segment right lower lobe, as before. There are vague areas of ground-glass in the right lower lobe (4/118) and left lower lobe (4/90), new from 10/22/2020. Mild centrilobular emphysema. Probable subpleural atelectasis in the posterior left lower lobe (4/103-110), new. No pleural fluid. Adherent debris in the airway. Upper Abdomen: Vague 1.4 cm low-attenuation lesion in the dome of the left hepatic lobe (2/122), corresponds to hyperattenuation on 10/22/2020 but is too small to characterize. Visualized portions of the liver, gallbladder adrenal glands are otherwise unremarkable. Small stone in the right kidney. Low-attenuation lesions in the left kidney measure up to 3.6 cm off the upper pole, incompletely visualized. Visualized portions of spleen, pancreas, stomach and bowel are unremarkable with the exception of a moderate hiatal hernia. No upper abdominal adenopathy. Musculoskeletal: Degenerative changes in the spine. Old lateral right rib fractures. No worrisome lytic or sclerotic lesions. IMPRESSION: 1. Post radiation parenchymal consolidation in the upper right hemithorax, unchanged. 2. Two new areas of a ground-glass in the lower lobes, possibly infectious/inflammatory in etiology. Consider short-term follow-up in 4-6 weeks, as clinically indicated. 3. Moderate hiatal hernia. 4. Right renal stone. 5.  Aortic atherosclerosis (ICD10-I70.0). 6. Enlarged pulmonic trunk, indicative of pulmonary arterial hypertension. 7.  Emphysema (ICD10-J43.9). Electronically Signed   By: Lorin Picket M.D.   On: 05/03/2021 12:04    Impression:  Stage IIA (T2b,  n0, m0) Adenocarcinoma presenting in the right upper lobe  No evidence of recurrence on clinical exam and recent CT scan of the chest.  Patient's chest CT scan however did showed 2 new areas of groundglass consolidation in the lower lobes recommended short interval CT scan to follow-up on these areas.  I discussed this with the patient and he is agreeable to this CT scan.  Orders placed today.  Plan: Follow-up in 3 months.  Prior to this follow-up appointment the patient will undergo a chest CT scan for follow-up of the new areas of groundglass changes in the lower lobes.   25 minutes of total time was spent for this patient encounter, including preparation, face-to-face counseling with the patient and coordination of care, physical exam, and documentation of the encounter and ordering of upcoming chest CT scan. ____________________________________  Blair Promise, PhD, MD   This document serves as a record of services personally performed by Gery Pray, MD. It was created on his behalf by Roney Mans, a trained medical scribe. The creation of this record is based on the scribe's personal observations and the provider's statements to them. This document has been checked and approved by the attending provider.

## 2021-05-05 ENCOUNTER — Other Ambulatory Visit: Payer: Self-pay

## 2021-05-05 ENCOUNTER — Ambulatory Visit
Admission: RE | Admit: 2021-05-05 | Discharge: 2021-05-05 | Disposition: A | Discharge status: Home / Self Care | Payer: Medicare Other | Source: Ambulatory Visit | Attending: Radiation Oncology | Admitting: Radiation Oncology

## 2021-05-05 ENCOUNTER — Encounter: Payer: Self-pay | Admitting: Radiation Oncology

## 2021-05-05 VITALS — BP 213/73 | HR 55 | Temp 97.7°F | Resp 18 | Ht 71.0 in | Wt 190.5 lb

## 2021-05-05 DIAGNOSIS — N200 Calculus of kidney: Secondary | ICD-10-CM | POA: Diagnosis not present

## 2021-05-05 DIAGNOSIS — Z79899 Other long term (current) drug therapy: Secondary | ICD-10-CM | POA: Diagnosis not present

## 2021-05-05 DIAGNOSIS — Z923 Personal history of irradiation: Secondary | ICD-10-CM | POA: Insufficient documentation

## 2021-05-05 DIAGNOSIS — I700 Atherosclerosis of aorta: Secondary | ICD-10-CM | POA: Diagnosis not present

## 2021-05-05 DIAGNOSIS — Z7901 Long term (current) use of anticoagulants: Secondary | ICD-10-CM | POA: Insufficient documentation

## 2021-05-05 DIAGNOSIS — J479 Bronchiectasis, uncomplicated: Secondary | ICD-10-CM | POA: Insufficient documentation

## 2021-05-05 DIAGNOSIS — Z7951 Long term (current) use of inhaled steroids: Secondary | ICD-10-CM | POA: Insufficient documentation

## 2021-05-05 DIAGNOSIS — Z85118 Personal history of other malignant neoplasm of bronchus and lung: Secondary | ICD-10-CM | POA: Insufficient documentation

## 2021-05-05 DIAGNOSIS — C3491 Malignant neoplasm of unspecified part of right bronchus or lung: Secondary | ICD-10-CM

## 2021-05-05 DIAGNOSIS — J432 Centrilobular emphysema: Secondary | ICD-10-CM | POA: Insufficient documentation

## 2021-05-05 DIAGNOSIS — K449 Diaphragmatic hernia without obstruction or gangrene: Secondary | ICD-10-CM | POA: Diagnosis not present

## 2021-05-05 DIAGNOSIS — Z08 Encounter for follow-up examination after completed treatment for malignant neoplasm: Secondary | ICD-10-CM | POA: Diagnosis not present

## 2021-05-05 NOTE — Progress Notes (Signed)
Christopher Rocha is here today for follow up post radiation to the lung.  Lung Side: Right, completed treatment on 06/10/19  Does the patient complain of any of the following: Pain:Patient denies pain  Shortness of breath w/wo exertion:  no Cough: Yes, productive.  Hemoptysis: no Pain with swallowing: no Swallowing/choking concerns: no Appetite: good  Energy Level: good energy level.  Post radiation skin Changes: no    Additional comments if applicable:  Noted patient to have elevated blood pressure. Patient reports not taking his losartan- hydrochlorothiazide this morning. Patient also reports being under stress due to taking care of wife that has dementia. Encouraged patient to take BP medication as directed to prevent sudden cardiac arrest or stroke. Patient voiced understanding.  Educated on signs and symptoms of cardiac arrest and stroke and the importance of going to ER is symptoms appear. Patient voiced understanding.  Vitals:   05/05/21 0944  BP: (!) 213/73  Pulse: (!) 55  Resp: 18  Temp: 97.7 F (36.5 C)  TempSrc: Temporal  SpO2: 98%  Weight: 190 lb 8 oz (86.4 kg)  Height: 5\' 11"  (1.803 m)

## 2021-05-23 DIAGNOSIS — D519 Vitamin B12 deficiency anemia, unspecified: Secondary | ICD-10-CM | POA: Diagnosis not present

## 2021-06-21 DIAGNOSIS — D519 Vitamin B12 deficiency anemia, unspecified: Secondary | ICD-10-CM | POA: Diagnosis not present

## 2021-06-21 DIAGNOSIS — Z23 Encounter for immunization: Secondary | ICD-10-CM | POA: Diagnosis not present

## 2021-07-07 NOTE — Progress Notes (Signed)
[facility]  [address] [place],  [place]  [address] [phone]  Clinic Day:  07/15/2021  Referring physician: [name], MD  This document serves as a record of services personally performed by Khaya Theissen Macarthur Critchley, MD. It was created on their behalf by [facility], a trained medical scribe. The creation of this record is based on the scribe's personal observations and the provider's statements to them.  HISTORY OF PRESENT ILLNESS:  Christopher Rocha is a 80 y.o. male with a history of iron deficiency anemia, which previously improved after he received IV iron.  However, more importantly, this gentleman had a right upper lobe lung mass that was biopsy proven in September 2020 to be adenocarcinoma.  Staging ultimately showed him to have stage IIA (T2b N0 M0) disease.  The patient completed stereotactic radiation to this lesion in November 2020.  He comes in today for routine follow-up.  Since his last visit, the patient has been doing fairly well.  He denies having increased fatigue or any overt forms of blood loss which concern him for progressive anemia.  With respect to his lung cancer, he is scheduled for a repeat chest CT per radiation oncology in [facility] for his radiographic surveillance.  He is currently dealing with bronchitis.    VITALS:  Blood pressure (!) 221/96, pulse 62, temperature 97.7 F (36.5 C), resp. rate 18, height 5\' 11"  (1.803 m), weight 188 lb 14.4 oz (85.7 kg), SpO2 96 %.  Wt Readings from Last 3 Encounters:  07/15/21 188 lb 14.4 oz (85.7 kg)  05/05/21 190 lb 8 oz (86.4 kg)  01/12/21 189 lb 3.2 oz (85.8 kg)    Body mass index is 26.35 kg/m.  Performance status (ECOG): 0 - Asymptomatic  PHYSICAL EXAM:  Physical Exam Constitutional:      General: He is not in acute distress.    Appearance: Normal appearance. He is normal weight.  HENT:     Head: Normocephalic and atraumatic.  Eyes:     General: No scleral  icterus.    Extraocular Movements: Extraocular movements intact.     Conjunctiva/sclera: Conjunctivae normal.     Pupils: Pupils are equal, round, and reactive to light.  Cardiovascular:     Rate and Rhythm: Normal rate and regular rhythm.     Pulses: Normal pulses.     Heart sounds: Normal heart sounds. No murmur heard.   No friction rub. No gallop.  Pulmonary:     Effort: Pulmonary effort is normal. No respiratory distress.     Breath sounds: Normal breath sounds.  Abdominal:     General: Bowel sounds are normal. There is no distension.     Palpations: Abdomen is soft. There is no hepatomegaly, splenomegaly or mass.     Tenderness: There is no abdominal tenderness.  Musculoskeletal:        General: Normal range of motion.     Cervical back: Normal range of motion and neck supple.     Right lower leg: No edema.     Left lower leg: No edema.  Lymphadenopathy:     Cervical: No cervical adenopathy.  Skin:    General: Skin is warm and dry.  Neurological:     General: No focal deficit present.     Mental Status: He is alert and oriented to person, place, and time. Mental status is at baseline.  Psychiatric:        Mood and Affect: Mood normal.  Behavior: Behavior normal.        Thought Content: Thought content normal.        Judgment: Judgment normal.    LABS:   CBC Latest Ref Rng & Units 07/15/2021 01/12/2021 08/19/2020  WBC - 14.3 9.8 22.5(HH)  Hemoglobin 13.5 - 17.5 13.3(A) 12.8(A) 13.3  Hematocrit 41 - 53 39(A) 39(A) 41.3  Platelets 150 - 399 238 177 170   CMP Latest Ref Rng & Units 01/12/2021 10/22/2020 08/19/2020  Glucose 65 - 99 mg/dL - - 486(H)  BUN 4 - 21 21 - 27  Creatinine 0.6 - 1.3 0.9 0.90 1.16  Sodium 137 - 147 137 - 125(L)  Potassium 3.4 - 5.3 4.1 - 5.2  Chloride 99 - 108 101 - 93(L)  CO2 13 - 22 31(A) - 24  Calcium 8.7 - 10.7 9.5 - 9.9  Total Protein 6.0 - 8.5 g/dL - - -  Total Bilirubin 0.0 - 1.2 mg/dL - - -  Alkaline Phos 25 - 125 96 - -  AST 14  - 40 21 - -  ALT 10 - 40 16 - -    ASSESSMENT & PLAN:  A 80 year old gentleman with stage IIA lung adenocarcinoma, status post stereotactic radiation in November 2020.  He also has a history of iron deficiency anemia.  Overall, I am pleased as his hemoglobin has generally improved without any recent particular intervention.  With respect to his cancer, he will continue to be followed by radiation oncology with scans to ensure there is no radiographic evidence of disease recurrence.  From a hematologic standpoint, the patient is doing well.  As he has a productive cough from bronchitis, I will prescribe him levofloxacin 750 mg x 5 days.  Otherwise, I will see him back in 6 months for repeat clinical assessment.  The patient understands all the plans discussed today and is in agreement with them.   I, Rita Ohara, am acting as scribe for Marice Potter, MD    I have reviewed this report as typed by the medical scribe, and it is complete and accurate.  Leida Luton Macarthur Critchley, MD

## 2021-07-15 ENCOUNTER — Other Ambulatory Visit: Payer: Self-pay

## 2021-07-15 ENCOUNTER — Inpatient Hospital Stay: Payer: Medicare Other | Attending: Oncology | Admitting: Oncology

## 2021-07-15 ENCOUNTER — Inpatient Hospital Stay: Payer: Medicare Other

## 2021-07-15 ENCOUNTER — Encounter: Payer: Self-pay | Admitting: Oncology

## 2021-07-15 ENCOUNTER — Other Ambulatory Visit: Payer: Self-pay | Admitting: Oncology

## 2021-07-15 VITALS — BP 221/96 | HR 62 | Temp 97.7°F | Resp 18 | Ht 71.0 in | Wt 188.9 lb

## 2021-07-15 DIAGNOSIS — D509 Iron deficiency anemia, unspecified: Secondary | ICD-10-CM | POA: Diagnosis not present

## 2021-07-15 DIAGNOSIS — D508 Other iron deficiency anemias: Secondary | ICD-10-CM

## 2021-07-15 DIAGNOSIS — D649 Anemia, unspecified: Secondary | ICD-10-CM | POA: Diagnosis not present

## 2021-07-15 LAB — CBC AND DIFFERENTIAL
HCT: 39 — AB (ref 41–53)
Hemoglobin: 13.3 — AB (ref 13.5–17.5)
Neutrophils Absolute: 9.87
Platelets: 238 (ref 150–399)
WBC: 14.3

## 2021-07-15 LAB — CBC: RBC: 4.37 (ref 3.87–5.11)

## 2021-07-15 LAB — FERRITIN: Ferritin: 95 ng/mL (ref 24–336)

## 2021-07-15 LAB — IRON AND TIBC
Iron: 93 ug/dL (ref 45–182)
Saturation Ratios: 26 % (ref 17.9–39.5)
TIBC: 361 ug/dL (ref 250–450)
UIBC: 268 ug/dL

## 2021-07-15 MED ORDER — LEVOFLOXACIN 750 MG PO TABS: 750.0000 mg | ORAL_TABLET | Freq: Every day | ORAL | 0 refills | Status: DC

## 2021-07-26 ENCOUNTER — Telehealth: Payer: Self-pay | Admitting: *Deleted

## 2021-07-26 NOTE — Telephone Encounter (Signed)
CALLED PATIENT TO INFORM OF CT FOR 08-04-21- ARRIVAL TIME- 3:15 PM @ [facility], NO RESTRICTIONS TO TEST, PATIENT TO RECEIVE RESULTS FROM DR. KINARD ON 08-08-21 @ 11 AM, SPOKE WITH PATIENT AND HE VERIFIED UNDERSTANDING THESE APPTS.

## 2021-08-01 ENCOUNTER — Telehealth: Payer: Self-pay | Admitting: *Deleted

## 2021-08-01 NOTE — Telephone Encounter (Signed)
CALLED PATIENT TO INFORM FU APPT. HAS BEEN MOVED TO 08-08-21 @ 4:15 PM DUE TO DR. KINARD BEING IN THE OR, SPOKE WITH PATIENT AND HE IS GOOD WITH THIS DAY AND TIME

## 2021-08-03 DIAGNOSIS — D519 Vitamin B12 deficiency anemia, unspecified: Secondary | ICD-10-CM | POA: Diagnosis not present

## 2021-08-04 ENCOUNTER — Ambulatory Visit (HOSPITAL_COMMUNITY)
Admission: RE | Admit: 2021-08-04 | Discharge: 2021-08-04 | Disposition: A | Discharge status: Home / Self Care | Payer: Medicare Other | Source: Ambulatory Visit | Attending: Radiation Oncology | Admitting: Radiation Oncology

## 2021-08-04 ENCOUNTER — Other Ambulatory Visit: Payer: Self-pay

## 2021-08-04 DIAGNOSIS — R911 Solitary pulmonary nodule: Secondary | ICD-10-CM | POA: Diagnosis not present

## 2021-08-04 DIAGNOSIS — R918 Other nonspecific abnormal finding of lung field: Secondary | ICD-10-CM | POA: Diagnosis not present

## 2021-08-04 DIAGNOSIS — C3491 Malignant neoplasm of unspecified part of right bronchus or lung: Secondary | ICD-10-CM | POA: Insufficient documentation

## 2021-08-04 DIAGNOSIS — I700 Atherosclerosis of aorta: Secondary | ICD-10-CM | POA: Diagnosis not present

## 2021-08-07 NOTE — Progress Notes (Signed)
Radiation Oncology         [phone] ________________________________  Name: Christopher Rocha MRN: [id number]  Date: 08/08/2021  DOB: 03/18/1941  Follow-Up Visit Note  CC: [name], MD  [name], MD    ICD-10-CM   1. 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   Since his last visit, the patient followed up with [name] on 07/15/21. During which time, the patient was noted to be doing well since his last visit. He denied any increased fatigue or any overt forms of blood loss which concern him for progressive anemia. The patient was however noted to be dealing with bronchitis at the time of this visit, for which [name] prescribed him levofloxacin 750 mg x 5 days.  Chest CT on 08/04/21 demonstrated an interval increase in size of an ill-defined area of ground-glass attenuation in the right lower lobe; noted as possibly infectious or inflammatory etiology, however, given the lack of progression compared to prior study, the possibility of a slow growing neoplasm could not excluded.  Other small pulmonary nodules scattered throughout the lungs bilaterally were also appreciated, the largest of which measuring 10 x 8 mm in the left lower lobe. Bilateral nodules are new compared to the recent prior examination, though favored to be benign in etiology.  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    The  patient's wife recently died of leukemia.  He seems to be coping pretty well with the situation.  He denies any significant pain within the chest area significant cough or hemoptysis.  He not take his inhalers today and therefore had some wheezing.  He in addition did not take his blood pressure medication today resulting in elevation of his blood pressure.                   Allergies:  is allergic to penicillins.  Meds: Current Outpatient Medications  Medication Sig Dispense Refill   acetaminophen (TYLENOL) 500 MG tablet Take 500 mg by mouth every 8 (eight) hours as needed (pain).     apixaban (ELIQUIS) 5 MG TABS tablet Take 1 tablet (5 mg total) by mouth 2 (two) times daily. 60 tablet 12   aspirin EC 81 MG tablet Take 81 mg by mouth every other day.      budesonide-formoterol (SYMBICORT) 160-4.5 MCG/ACT inhaler Inhale 2 puffs into the lungs 2 (two) times daily. 1 Inhaler 5   glyBURIDE (DIABETA) 1.25 MG tablet Take 1.25 mg by mouth daily.     ibuprofen (ADVIL) 200 MG tablet Take 200 mg by mouth every 6 (six) hours as needed.     Naphazoline-Glycerin (REDNESS RELIEF OP) Place 1 drop into both eyes daily as needed (for redness/itching).     PROAIR HFA 108 (90 Base) MCG/ACT inhaler Inhale 2 puffs into the lungs every 4 (four) hours as needed for shortness of breath.     rosuvastatin (CRESTOR) 10  MG tablet Take 1 tablet by mouth once daily 90 tablet 2   triamcinolone (KENALOG) 0.1 % Apply 1 application topically. 2-3 times daily     levofloxacin (LEVAQUIN) 750 MG tablet Take 1 tablet (750 mg total) by mouth daily. (Patient not taking: Reported on 08/08/2021) 5 tablet 0   losartan-hydrochlorothiazide (HYZAAR) 100-12.5 MG tablet Take 1 tablet by mouth daily.     No current facility-administered medications for this encounter.    Physical Findings: The patient is in no acute distress. Patient is alert and oriented.  height is 5\' 11"  (1.803 m) and weight is 188 lb 6.4 oz (85.5 kg). His  temperature is 98 F (36.7 C). His blood pressure is 229/65 (abnormal) and his pulse is 53 (abnormal). His respiration is 20 and oxygen saturation is 97%. .   Lungs are clear to auscultation bilaterally. Heart has regular rate and rhythm. No palpable cervical, supraclavicular, or axillary adenopathy. Abdomen soft, non-tender, normal bowel sounds.    Lab Findings: Lab Results  Component Value Date   WBC 14.3 07/15/2021   HGB 13.3 (A) 07/15/2021   HCT 39 (A) 07/15/2021   MCV 90 08/19/2020   PLT 238 07/15/2021    Radiographic Findings: CT CHEST WO CONTRAST  Result Date: 08/05/2021 CLINICAL DATA:  80 year old male with history of non-small cell lung cancer. Evaluate for treatment response. EXAM: CT CHEST WITHOUT CONTRAST TECHNIQUE: Multidetector CT imaging of the chest was performed following the standard protocol without IV contrast. COMPARISON:  Chest CT 05/02/2021. FINDINGS: Cardiovascular: Heart size is normal. Insert pericardium There is aortic atherosclerosis, as well as atherosclerosis of the great vessels of the mediastinum and the coronary arteries, including calcified atherosclerotic plaque in the left main, left anterior descending, left circumflex and right coronary arteries. 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 Severe calcifications of the mitral annulus. Mediastinum/Nodes: No pathologically enlarged mediastinal or hilar lymph nodes. Small hiatal hernia. No axillary lymphadenopathy. Lungs/Pleura: Chronic mass-like area of architectural distortion in the right upper lobe is similar to the prior study, currently measuring 6.2 x 3.9 cm, most compatible with chronic postradiation masslike fibrosis. Previously noted new nodular area of ground-glass attenuation in the left lower lobe resolved compared to the prior study. However, the previously noted area of ground-glass attenuation in the right lower lobe appears larger than the prior examination  (axial image 115 of series 5), currently measuring 3.1 x 2.0 cm. A few other scattered new pulmonary nodules are also noted, largest of which is in the periphery of the left lower lobe (axial image 112 of series 5) measuring 10 x 8 mm. No pleural effusions. Upper Abdomen: Aortic atherosclerosis. Low-attenuation lesions associated with both kidneys, incompletely characterized on today's noncontrast CT examination, but statistically likely to represent cysts, largest of which is in the upper pole of the left kidney measuring at least 3.3 x 2.2 cm. Musculoskeletal: Median sternotomy wires. There are no aggressive appearing lytic or blastic lesions noted in the visualized portions of the skeleton. IMPRESSION: 1. Stable appearance of postradiation mass-like fibrosis in the right upper lobe. No lymphadenopathy noted on today's examination. 2. Interval increased size of ill-defined area of ground-glass attenuation in the right lower lobe. This still could be of infectious or inflammatory etiology, however, given the lack of progression compared to the prior study, the possibility of a slow growing neoplasm is not excluded. Close attention on short term repeat noncontrast chest CT is recommended in 3 months to re-evaluate this  lesion. At this time, there is no central solid component to serve as an adequate target for PET imaging. 3. Other small pulmonary nodules scattered throughout the lungs bilaterally, largest of which is 10 x 8 mm in the left lower lobe, new compared to the recent prior examination, favored to be benign, but close attention on follow-up study is also recommended. 4. Aortic atherosclerosis, in addition to left main and 3 vessel coronary artery disease. Status post median sternotomy for CABG including LIMA to the LAD. 5. Status post TAVR. 6. There are calcifications of the mitral annulus. Echocardiographic correlation for evaluation of potential valvular dysfunction may be warranted if clinically  indicated. Aortic Atherosclerosis (ICD10-I70.0). Electronically Signed   By: Vinnie Langton M.D.   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  Discussed the most recent chest CT scan findings and the patient is in agreement to repeat a chest CT scan in 3 months.  Plan: Chest CT scan in 3 months.  Patient will follow-up soon afterward to review.  Once the patient returns home he will take his blood pressure medication and his inhalers.  He reports checking his blood pressure frequently at home and he does not have these significant elevations.   25 minutes of total time was spent for this patient encounter, including preparation, face-to-face counseling with the patient and coordination of care, physical exam, and documentation of the encounter. ____________________________________  Blair Promise, PhD, MD   This document serves as a record of services personally performed by Gery Pray, MD. It was created on his behalf by Roney Mans, a trained medical scribe. The creation of this record is based on the scribe's personal observations and the provider's statements to them. This document has been checked and approved by the attending provider.

## 2021-08-08 ENCOUNTER — Ambulatory Visit
Admission: RE | Admit: 2021-08-08 | Discharge: 2021-08-08 | Disposition: A | Discharge status: Home / Self Care | Payer: Medicare Other | Source: Ambulatory Visit | Attending: Radiation Oncology | Admitting: Radiation Oncology

## 2021-08-08 ENCOUNTER — Other Ambulatory Visit: Payer: Self-pay

## 2021-08-08 ENCOUNTER — Encounter: Payer: Self-pay | Admitting: Radiation Oncology

## 2021-08-08 DIAGNOSIS — I2510 Atherosclerotic heart disease of native coronary artery without angina pectoris: Secondary | ICD-10-CM | POA: Insufficient documentation

## 2021-08-08 DIAGNOSIS — Z923 Personal history of irradiation: Secondary | ICD-10-CM | POA: Diagnosis not present

## 2021-08-08 DIAGNOSIS — R918 Other nonspecific abnormal finding of lung field: Secondary | ICD-10-CM | POA: Insufficient documentation

## 2021-08-08 DIAGNOSIS — Z7901 Long term (current) use of anticoagulants: Secondary | ICD-10-CM | POA: Insufficient documentation

## 2021-08-08 DIAGNOSIS — Z79899 Other long term (current) drug therapy: Secondary | ICD-10-CM | POA: Diagnosis not present

## 2021-08-08 DIAGNOSIS — K449 Diaphragmatic hernia without obstruction or gangrene: Secondary | ICD-10-CM | POA: Diagnosis not present

## 2021-08-08 DIAGNOSIS — Z85118 Personal history of other malignant neoplasm of bronchus and lung: Secondary | ICD-10-CM | POA: Diagnosis not present

## 2021-08-08 DIAGNOSIS — C3491 Malignant neoplasm of unspecified part of right bronchus or lung: Secondary | ICD-10-CM

## 2021-08-08 DIAGNOSIS — I700 Atherosclerosis of aorta: Secondary | ICD-10-CM | POA: Insufficient documentation

## 2021-08-08 DIAGNOSIS — I3481 Nonrheumatic mitral (valve) annulus calcification: Secondary | ICD-10-CM | POA: Insufficient documentation

## 2021-08-08 DIAGNOSIS — Z08 Encounter for follow-up examination after completed treatment for malignant neoplasm: Secondary | ICD-10-CM | POA: Diagnosis not present

## 2021-08-08 NOTE — Progress Notes (Addendum)
Christopher Rocha is here today for follow up post radiation to the lung.  Lung Side: right,patient completed treatment on 06/10/19  Does the patient complain of any of the following: Pain:No Shortness of breath w/wo exertion: yes, noted patient to be short of breath while speaking Cough: Patient reports having a productive cough, producing thick white sputum. Hemoptysis: no Pain with swallowing: no Swallowing/choking concerns: no Appetite: Patient reports having a poor appetite. Patient states he goes weeks at at time without eating.  Energy Level:Reports having a low energy level.  Post radiation skin Changes: intact.     Additional comments if applicable: Patient continues to have elevated blood pressure. Patient reports not taking blood pressure medication this am. Continued education provided on the importance of taking medications as prescribed. Patient voiced understanding.  Patient also reports being out of both inhalers. ( Proair and Symbicort) Patient also reports recently losing his wife to cancer.   Vitals:   08/08/21 1326  BP: (!) 229/65  Pulse: (!) 53  Resp: 20  Temp: 98 F (36.7 C)  SpO2: 97%  Weight: 188 lb 6.4 oz (85.5 kg)  Height: 5\' 11"  (1.803 m)

## 2021-09-01 DIAGNOSIS — D519 Vitamin B12 deficiency anemia, unspecified: Secondary | ICD-10-CM | POA: Diagnosis not present

## 2021-09-22 DIAGNOSIS — D519 Vitamin B12 deficiency anemia, unspecified: Secondary | ICD-10-CM | POA: Diagnosis not present

## 2021-10-18 DIAGNOSIS — M65342 Trigger finger, left ring finger: Secondary | ICD-10-CM | POA: Insufficient documentation

## 2021-10-18 DIAGNOSIS — M65341 Trigger finger, right ring finger: Secondary | ICD-10-CM

## 2021-10-18 HISTORY — DX: Trigger finger, right ring finger: M65.341

## 2021-10-18 HISTORY — DX: Trigger finger, left ring finger: M65.342

## 2021-10-26 DIAGNOSIS — D519 Vitamin B12 deficiency anemia, unspecified: Secondary | ICD-10-CM | POA: Diagnosis not present

## 2021-11-07 ENCOUNTER — Ambulatory Visit (HOSPITAL_COMMUNITY)
Admission: RE | Admit: 2021-11-07 | Discharge: 2021-11-07 | Disposition: A | Discharge status: Home / Self Care | Payer: Medicare Other | Source: Ambulatory Visit | Attending: Radiation Oncology | Admitting: Radiation Oncology

## 2021-11-07 ENCOUNTER — Other Ambulatory Visit: Payer: Self-pay

## 2021-11-07 DIAGNOSIS — J439 Emphysema, unspecified: Secondary | ICD-10-CM | POA: Diagnosis not present

## 2021-11-07 DIAGNOSIS — R918 Other nonspecific abnormal finding of lung field: Secondary | ICD-10-CM | POA: Diagnosis not present

## 2021-11-07 DIAGNOSIS — C3491 Malignant neoplasm of unspecified part of right bronchus or lung: Secondary | ICD-10-CM | POA: Insufficient documentation

## 2021-11-07 DIAGNOSIS — I700 Atherosclerosis of aorta: Secondary | ICD-10-CM | POA: Diagnosis not present

## 2021-11-07 DIAGNOSIS — R911 Solitary pulmonary nodule: Secondary | ICD-10-CM | POA: Diagnosis not present

## 2021-11-09 DIAGNOSIS — M65341 Trigger finger, right ring finger: Secondary | ICD-10-CM | POA: Diagnosis not present

## 2021-11-09 DIAGNOSIS — M65342 Trigger finger, left ring finger: Secondary | ICD-10-CM | POA: Diagnosis not present

## 2021-11-10 ENCOUNTER — Ambulatory Visit: Payer: Self-pay | Admitting: Radiation Oncology

## 2021-11-16 NOTE — Progress Notes (Signed)
?Radiation Oncology         [phone] ?________________________________ ? ?Name: Christopher Rocha MRN: [id number]  ?Date: 11/17/2021  DOB: [birth year] ? ?Follow-Up Visit Note ? ?CC: [name], MD  [name], MD ? ?  ICD-10-CM   ?1. Adenocarcinoma, lung, right ([facility])  C34.91 NM PET Image Restag (PS) Skull Base To Thigh  ?  ?2. Pulmonary nodule  R91.1   ?  ? ? 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 ?Interval Since Last Radiation: 2 years, 5 months, and 10 days  ? ?Radiation Treatment Dates: 06/03/2019 through 06/10/2019 ?Site Technique Total Dose Dose per Fx Completed Fx Beam Energies  ?Thorax: Lung_Rt IMRT 54/54 18 3/3 6XFFF  ? ? ?Narrative: The patient returns today for routine follow-up and to review recent imaging. He was last seen here for follow up on 08/08/21.      ? ?Chest CT on 11/09/21 showed an enlarging and increasingly solid right lower lobe pulmonary nodule suspicious for metachronous lung cancer (likely adenocarcinoma). The treated right upper lobe lesion appeared unchanged in the interval, and no evidence of metastatic disease was appreciated. (Enlarging dependent densities were appreciated at the left lung base which were noted as likely postinflammatory in nature).    ? ?Otherwise, no significant interval history since the patient was last seen.  ? ?He denies any pain within the chest area significant cough or hemoptysis.  He continues to follow-up with Dr. Lavera Guise in [place] concerning iron deficiency anemia. ?            ? ?Allergies:  is allergic to penicillins. ? ?Meds: ?Current Outpatient Medications  ?Medication Sig Dispense Refill  ? acetaminophen (TYLENOL) 500 MG tablet Take 500 mg by mouth every 8 (eight) hours as needed (pain).    ? apixaban (ELIQUIS) 5 MG TABS tablet Take 1 tablet (5 mg total) by mouth 2 (two) times daily. 60 tablet 12  ? aspirin EC 81 MG tablet Take 81 mg by mouth every other day.     ? glyBURIDE  (DIABETA) 1.25 MG tablet Take 1.25 mg by mouth daily.    ? ibuprofen (ADVIL) 200 MG tablet Take 200 mg by mouth every 6 (six) hours as needed.    ? losartan-hydrochlorothiazide (HYZAAR) 100-12.5 MG tablet Take 1 tablet by mouth daily.    ? Naphazoline-Glycerin (REDNESS RELIEF OP) Place 1 drop into both eyes daily as needed (for redness/itching).    ? PROAIR HFA 108 (90 Base) MCG/ACT inhaler Inhale 2 puffs into the lungs every 4 (four) hours as needed for shortness of breath.    ? rosuvastatin (CRESTOR) 10 MG tablet Take 1 tablet by mouth once daily 90 tablet 2  ? triamcinolone (KENALOG) 0.1 % Apply 1 application topically. 2-3 times daily    ? budesonide-formoterol (SYMBICORT) 160-4.5 MCG/ACT inhaler Inhale 2 puffs into the lungs 2 (two) times daily. (Patient not taking: Reported on 11/17/2021) 1 Inhaler 5  ? levofloxacin (LEVAQUIN) 750 MG tablet Take 1 tablet (750 mg total) by mouth daily. (Patient not taking: Reported on 08/08/2021) 5 tablet 0  ? ?No current facility-administered medications for this encounter.  ? ? ?Physical Findings: ?The patient is in no acute distress. Patient is alert and oriented. ? height is 5\' 11"  (1.803 m) and weight is 190 lb 4 oz (86.3 kg). His temporal temperature is 97.5 ?F (36.4 ?C) (abnormal). His blood pressure is 204/77 (abnormal). His respiration is 20 and oxygen  saturation is 97%. .  No significant changes. Lungs are clear to auscultation bilaterally. Heart has regular rate and rhythm. No palpable cervical, supraclavicular, or axillary adenopathy. Abdomen soft, non-tender, normal bowel sounds. ? ? ?Lab Findings: ?Lab Results  ?Component Value Date  ? WBC 14.3 07/15/2021  ? HGB 13.3 (A) 07/15/2021  ? HCT 39 (A) 07/15/2021  ? MCV 90 08/19/2020  ? PLT 238 07/15/2021  ? ? ?Radiographic Findings: ?CT CHEST WO CONTRAST ? ?Result Date: 11/07/2021 ?CLINICAL DATA:  Non-small cell lung cancer, non metastatic. Assess treatment response. Initial diagnosis in 2020. Radiation therapy completed. *  onc * EXAM: CT CHEST WITHOUT CONTRAST TECHNIQUE: Multidetector CT imaging of the chest was performed following the standard protocol without IV contrast. RADIATION DOSE REDUCTION: This exam was performed according to the departmental dose-optimization program which includes automated exposure control, adjustment of the mA and/or kV according to patient size and/or use of iterative reconstruction technique. COMPARISON:  Chest CT 08/14/2021, 05/02/2021 and 10/22/2010. FINDINGS: Cardiovascular: Extensive atherosclerosis of the aorta, great vessels and coronary arteries status post median sternotomy, CABG and TAVR. There are prominent calcifications of the mitral annulus. The heart size is normal. There is no pericardial effusion. Mediastinum/Nodes: There are no enlarged mediastinal, hilar or axillary lymph nodes.Small mediastinal lymph nodes are unchanged. Hilar assessment is limited by the lack of intravenous contrast, although the hilar contours appear unchanged. The thyroid gland appears stable without significant findings. There is a stable small hiatal hernia. Lungs/Pleura: No pleural effusion or pneumothorax. Mild centrilobular emphysema with diffuse central airway thickening. Chronic mass-like architectural distortion and volume loss in the right upper lobe and superior segment of the right lower lobe are unchanged. As measured in a similar fashion to the previous study, this currently measures 5.7 x 3.8 cm on image 22/5 (previously 6.2 x 3.9 cm). Part solid right lower lobe nodule shows continued growth and increasing solid components, currently measuring approximately 2.2 x 1.2 cm on image 100/5 (remeasured at the same level approximately 1.8 x 0.9 cm previously). Increasing densities dependently in the left lower lobe are predominately linear and may reflect atelectasis or scarring. There is a small nodular component measuring up to 1.6 cm on image 111/5. Additional scattered small pulmonary nodules  bilaterally are unchanged. Upper abdomen: The visualized upper abdomen appears stable without significant findings. There are bilateral renal cysts. Musculoskeletal/Chest wall: There is no chest wall mass or suspicious osseous finding. Stable chronic rib fractures on the right, sequela of previous median sternotomy and thoracic spondylosis. IMPRESSION: 1. Enlarging and increasingly solid right lower lobe pulmonary nodule suspicious for metachronous lung cancer (likely adenocarcinoma). The solid components are borderline in size for evaluation by PET-CT which is recommended at this time. 2. The treated right upper lobe lesion appears unchanged. 3. No evidence of metastatic disease. Enlarging dependent densities at the left lung base are likely postinflammatory. 4. Coronary and aortic atherosclerosis (ICD10-I70.0). Emphysema (ICD10-J43.9). Electronically Signed   By: Richardean Sale M.D.   On: 11/07/2021 13:13   ? 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 ?No evidence of recurrence on clinical exam today.  Recent chest CT scan however is concerning for a new lesion in the right lower lobe.  I reviewed the CT images with the patient and his daughter in detail.  Radiology recommends a PET scan for further evaluation.  I agree with this recommendation.  The patient and his daughter wish that he proceed with this evaluation to further  evaluate the right lower lobe nodule. ? ?Plan: PET scan then in the near future.  The patient will be seen after the PET scan for further review.  If PET scan is significant then he will be referred to pulmonary medicine to be considered for bronchoscopy and biopsy. ? ? ?25 minutes of total time was spent for this patient encounter, including preparation, face-to-face counseling with the patient and coordination of care, physical exam, and documentation of the encounter. ?____________________________________ ? ?Blair Promise, PhD, MD ? ? ?This  document serves as a record of services personally performed by Gery Pray, MD. It was created on his behalf by Roney Mans, a trained medical scribe. The creation of this record is based on the scribe's p

## 2021-11-17 ENCOUNTER — Other Ambulatory Visit: Payer: Self-pay

## 2021-11-17 ENCOUNTER — Encounter: Payer: Self-pay | Admitting: Radiation Oncology

## 2021-11-17 ENCOUNTER — Ambulatory Visit
Admission: RE | Admit: 2021-11-17 | Discharge: 2021-11-17 | Disposition: A | Discharge status: Home / Self Care | Payer: Medicare Other | Source: Ambulatory Visit | Attending: Radiation Oncology | Admitting: Radiation Oncology

## 2021-11-17 VITALS — BP 204/77 | Temp 97.5°F | Resp 20 | Ht 71.0 in | Wt 190.2 lb

## 2021-11-17 DIAGNOSIS — Z923 Personal history of irradiation: Secondary | ICD-10-CM | POA: Diagnosis not present

## 2021-11-17 DIAGNOSIS — R918 Other nonspecific abnormal finding of lung field: Secondary | ICD-10-CM | POA: Insufficient documentation

## 2021-11-17 DIAGNOSIS — R911 Solitary pulmonary nodule: Secondary | ICD-10-CM | POA: Diagnosis not present

## 2021-11-17 DIAGNOSIS — Z85118 Personal history of other malignant neoplasm of bronchus and lung: Secondary | ICD-10-CM | POA: Insufficient documentation

## 2021-11-17 NOTE — Progress Notes (Signed)
Christopher Rocha is here today for follow up post radiation to the lung. ? ?Lung Side: Right, patient completed treatment on 06/10/19.  ? ?Does the patient complain of any of the following: ?Pain: Patient denies pain. ?Shortness of breath w/wo exertion: No ?Cough:  Yes, at times.  ?Hemoptysis: no ?Pain with swallowing: no ?Swallowing/choking concerns: no ?Appetite: Good ?Weight- ?Wt Readings from Last 3 Encounters:  ?11/17/21 190 lb 4 oz (86.3 kg)  ?08/08/21 188 lb 6.4 oz (85.5 kg)  ?07/15/21 188 lb 14.4 oz (85.7 kg)  ? ?Energy Level: Good ?Post radiation skin Changes:  no  ? ? ? ?Additional comments if applicable:  ?Patient report having a upcoming surgery to left hand.  ?Vitals:  ? 11/17/21 1126  ?BP: (!) 204/77  ?Resp: 20  ?Temp: (!) 97.5 ?F (36.4 ?C)  ?TempSrc: Temporal  ?SpO2: 97%  ?Weight: 190 lb 4 oz (86.3 kg)  ?Height: 5\' 11"  (1.803 m)  ?  ?

## 2021-11-21 ENCOUNTER — Ambulatory Visit (HOSPITAL_COMMUNITY)
Admission: RE | Admit: 2021-11-21 | Discharge: 2021-11-21 | Disposition: A | Discharge status: Home / Self Care | Payer: Medicare Other | Source: Ambulatory Visit | Attending: Radiation Oncology | Admitting: Radiation Oncology

## 2021-11-21 ENCOUNTER — Other Ambulatory Visit: Payer: Self-pay

## 2021-11-21 DIAGNOSIS — C3490 Malignant neoplasm of unspecified part of unspecified bronchus or lung: Secondary | ICD-10-CM | POA: Diagnosis not present

## 2021-11-21 DIAGNOSIS — C3491 Malignant neoplasm of unspecified part of right bronchus or lung: Secondary | ICD-10-CM | POA: Insufficient documentation

## 2021-11-21 DIAGNOSIS — J984 Other disorders of lung: Secondary | ICD-10-CM | POA: Insufficient documentation

## 2021-11-21 DIAGNOSIS — N281 Cyst of kidney, acquired: Secondary | ICD-10-CM | POA: Insufficient documentation

## 2021-11-21 DIAGNOSIS — N200 Calculus of kidney: Secondary | ICD-10-CM | POA: Diagnosis not present

## 2021-11-21 LAB — GLUCOSE, CAPILLARY: Glucose-Capillary: 149 mg/dL — ABNORMAL HIGH (ref 70–99)

## 2021-11-21 MED ORDER — FLUDEOXYGLUCOSE F - 18 (FDG) INJECTION
9.5000 | Freq: Once | INTRAVENOUS | Status: AC
  Administered 2021-11-21: 9.28 via INTRAVENOUS

## 2021-11-22 NOTE — Progress Notes (Incomplete)
?Radiation Oncology         [phone] ?________________________________ ? ?Name: Christopher Rocha MRN: [id number]  ?Date: 11/23/2021  DOB: Dec 24, 1940 ? ?Follow-Up Visit Note ? ?CC: [name], MD  [name], MD ? ?No diagnosis found. ? 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 ?Interval Since Last Radiation:  2 years, 5 months, and 16 days  ? ?Radiation Treatment Dates: 06/03/2019 through 06/10/2019 ?Site Technique Total Dose Dose per Fx Completed Fx Beam Energies  ?Thorax: Lung_Rt IMRT 54/54 18 3/3 6XFFF  ? ? ?Narrative:  The patient returns today to review recent imaging, he was last seen here for follow up on 11/17/21. To review from our last visit, chest CT on 11/09/21 was concerning for a new lesion in the RLL. I reviewed the CT images with the patient and his daughter in detail, and advised further evaluation via PET imaging.  ? ?Subsequent PET on 11/21/21 showed the right lower lobe nodule of concern with low level FDG accumulation, again noted as concerning for neoplasm. PET also showed stable appearing post-treatment scarring in the posterior right upper lobe without findings suggestive of local recurrence. No evidence of hypermetabolic metastatic disease was appreciated.  ? ?Otherwise, no significant interval history since the patient was seen last week.  ? ?***                     ? ?Allergies:  is allergic to penicillins. ? ?Meds: ?Current Outpatient Medications  ?Medication Sig Dispense Refill  ? acetaminophen (TYLENOL) 500 MG tablet Take 500 mg by mouth every 8 (eight) hours as needed (pain).    ? apixaban (ELIQUIS) 5 MG TABS tablet Take 1 tablet (5 mg total) by mouth 2 (two) times daily. 60 tablet 12  ? aspirin EC 81 MG tablet Take 81 mg by mouth every other day.     ? budesonide-formoterol (SYMBICORT) 160-4.5 MCG/ACT inhaler Inhale 2 puffs into the lungs 2 (two) times daily. (Patient not taking: Reported on 11/17/2021) 1 Inhaler 5  ?  glyBURIDE (DIABETA) 1.25 MG tablet Take 1.25 mg by mouth daily.    ? ibuprofen (ADVIL) 200 MG tablet Take 200 mg by mouth every 6 (six) hours as needed.    ? levofloxacin (LEVAQUIN) 750 MG tablet Take 1 tablet (750 mg total) by mouth daily. (Patient not taking: Reported on 08/08/2021) 5 tablet 0  ? losartan-hydrochlorothiazide (HYZAAR) 100-12.5 MG tablet Take 1 tablet by mouth daily.    ? Naphazoline-Glycerin (REDNESS RELIEF OP) Place 1 drop into both eyes daily as needed (for redness/itching).    ? PROAIR HFA 108 (90 Base) MCG/ACT inhaler Inhale 2 puffs into the lungs every 4 (four) hours as needed for shortness of breath.    ? rosuvastatin (CRESTOR) 10 MG tablet Take 1 tablet by mouth once daily 90 tablet 2  ? triamcinolone (KENALOG) 0.1 % Apply 1 application topically. 2-3 times daily    ? ?No current facility-administered medications for this encounter.  ? ? ?Physical Findings: ?The patient is in no acute distress. Patient is alert and oriented. ? vitals were not taken for this visit. .  No significant changes. Lungs are clear to auscultation bilaterally. Heart has regular rate and rhythm. No palpable cervical, supraclavicular, or axillary adenopathy. Abdomen soft, non-tender, normal bowel sounds. *** ? ? ?Lab Findings: ?Lab Results  ?Component Value Date  ? WBC 14.3 07/15/2021  ? HGB 13.3 (A) 07/15/2021  ? HCT 39 (  A) 07/15/2021  ? MCV 90 08/19/2020  ? PLT 238 07/15/2021  ? ? ?Radiographic Findings: ?CT CHEST WO CONTRAST ? ?Result Date: 11/07/2021 ?CLINICAL DATA:  Non-small cell lung cancer, non metastatic. Assess treatment response. Initial diagnosis in 2020. Radiation therapy completed. * onc * EXAM: CT CHEST WITHOUT CONTRAST TECHNIQUE: Multidetector CT imaging of the chest was performed following the standard protocol without IV contrast. RADIATION DOSE REDUCTION: This exam was performed according to the departmental dose-optimization program which includes automated exposure control, adjustment of the mA and/or  kV according to patient size and/or use of iterative reconstruction technique. COMPARISON:  Chest CT 08/14/2021, 05/02/2021 and 10/22/2010. FINDINGS: Cardiovascular: Extensive atherosclerosis of the aorta, great vessels and coronary arteries status post median sternotomy, CABG and TAVR. There are prominent calcifications of the mitral annulus. The heart size is normal. There is no pericardial effusion. Mediastinum/Nodes: There are no enlarged mediastinal, hilar or axillary lymph nodes.Small mediastinal lymph nodes are unchanged. Hilar assessment is limited by the lack of intravenous contrast, although the hilar contours appear unchanged. The thyroid gland appears stable without significant findings. There is a stable small hiatal hernia. Lungs/Pleura: No pleural effusion or pneumothorax. Mild centrilobular emphysema with diffuse central airway thickening. Chronic mass-like architectural distortion and volume loss in the right upper lobe and superior segment of the right lower lobe are unchanged. As measured in a similar fashion to the previous study, this currently measures 5.7 x 3.8 cm on image 22/5 (previously 6.2 x 3.9 cm). Part solid right lower lobe nodule shows continued growth and increasing solid components, currently measuring approximately 2.2 x 1.2 cm on image 100/5 (remeasured at the same level approximately 1.8 x 0.9 cm previously). Increasing densities dependently in the left lower lobe are predominately linear and may reflect atelectasis or scarring. There is a small nodular component measuring up to 1.6 cm on image 111/5. Additional scattered small pulmonary nodules bilaterally are unchanged. Upper abdomen: The visualized upper abdomen appears stable without significant findings. There are bilateral renal cysts. Musculoskeletal/Chest wall: There is no chest wall mass or suspicious osseous finding. Stable chronic rib fractures on the right, sequela of previous median sternotomy and thoracic  spondylosis. IMPRESSION: 1. Enlarging and increasingly solid right lower lobe pulmonary nodule suspicious for metachronous lung cancer (likely adenocarcinoma). The solid components are borderline in size for evaluation by PET-CT which is recommended at this time. 2. The treated right upper lobe lesion appears unchanged. 3. No evidence of metastatic disease. Enlarging dependent densities at the left lung base are likely postinflammatory. 4. Coronary and aortic atherosclerosis (ICD10-I70.0). Emphysema (ICD10-J43.9). Electronically Signed   By: Richardean Sale M.D.   On: 11/07/2021 13:13  ? ?NM PET Image Restag (PS) Skull Base To Thigh ? ?Result Date: 11/22/2021 ?CLINICAL DATA:  Subsequent treatment strategy for non-small-cell lung cancer. EXAM: NUCLEAR MEDICINE PET SKULL BASE TO THIGH TECHNIQUE: Not 0.3 mCi F-18 FDG was injected intravenously. Full-ring PET imaging was performed from the skull base to thigh after the radiotracer. CT data was obtained and used for attenuation correction and anatomic localization. Fasting blood glucose: 149 mg/dl COMPARISON:  Chest CT 11/07/2021.  PET-CT 01/07/2020 FINDINGS: Mediastinal blood pool activity: SUV max 2.8 Liver activity: SUV max NA NECK: No hypermetabolic lymph nodes in the neck. Incidental CT findings: none CHEST: Heterogeneous low level uptake is identified in the posterior right upper lobe region of post treatment opacity. SUV max = 3.7 today compared to 4.6 on 01/07/2020. Imaging features compatible with scarring. Right lower lobe lesion of  concern on 11/07/2021 CT scan shows low level FDG accumulation with SUV max = 3.0 No hypermetabolic mediastinal or hilar lymphadenopathy. Similar appearance of FDG accumulation in the hiatal hernia and gastric wall. Incidental CT findings: Status post TAVR. Mitral annular calcification evident. Coronary artery calcification is evident. Status post CABG. Moderate atherosclerotic calcification is noted in the wall of the thoracic  aorta. ABDOMEN/PELVIS: No abnormal hypermetabolic activity within the liver, pancreas, adrenal glands, or spleen. No hypermetabolic lymph nodes in the abdomen or pelvis. Incidental CT findings: Multiple nonobstructing r

## 2021-11-23 ENCOUNTER — Ambulatory Visit
Admission: RE | Admit: 2021-11-23 | Discharge: 2021-11-23 | Disposition: A | Discharge status: Home / Self Care | Payer: Medicare Other | Source: Ambulatory Visit | Attending: Radiation Oncology | Admitting: Radiation Oncology

## 2021-11-23 NOTE — Progress Notes (Signed)
?Radiation Oncology         [phone] ?________________________________ ? ?Name: Christopher Rocha MRN: [id number]  ?Date: 11/24/2021  DOB: [birth year] ? ?Follow-Up Visit Note ? ?CC: Christopher Hipps, MD  Christopher Pollack, MD ? ?  ICD-10-CM   ?1. Adenocarcinoma, lung, right ([place])  C34.91   ?  ? ? 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 ?Interval Since Last Radiation:  2 years, 5 months, and 17 days  ? ?Radiation Treatment Dates: 06/03/2019 through 06/10/2019 ?Site Technique Total Dose Dose per Fx Completed Fx Beam Energies  ?Thorax: Lung_Rt IMRT 54/54 18 3/3 6XFFF  ? ? ?Narrative:  The patient returns today to review recent imaging, he was last seen here for follow up on 11/17/21. To review from our last visit, chest CT on 11/09/21 was concerning for a new lesion in the RLL. I reviewed the CT images with the patient and his daughter in detail, and advised further evaluation via PET imaging.  ? ?Subsequent PET on 11/21/21 showed the right lower lobe nodule of concern with low level FDG accumulation, again noted as concerning for neoplasm. PET also showed stable appearing post-treatment scarring in the posterior right upper lobe without findings suggestive of local recurrence. No evidence of hypermetabolic metastatic disease was appreciated.  ? ?Otherwise, no significant interval history since the patient was seen last week.  ?                  ? ?Allergies:  is allergic to penicillins. ? ?Meds: ?Current Outpatient Medications  ?Medication Sig Dispense Refill  ? acetaminophen (TYLENOL) 500 MG tablet Take 500 mg by mouth every 8 (eight) hours as needed (pain).    ? apixaban (ELIQUIS) 5 MG TABS tablet Take 1 tablet (5 mg total) by mouth 2 (two) times daily. 60 tablet 12  ? aspirin EC 81 MG tablet Take 81 mg by mouth every other day.     ? budesonide-formoterol (SYMBICORT) 160-4.5 MCG/ACT inhaler Inhale 2 puffs into the lungs 2 (two) times daily. (Patient not taking:  Reported on 11/17/2021) 1 Inhaler 5  ? glyBURIDE (DIABETA) 1.25 MG tablet Take 1.25 mg by mouth daily.    ? ibuprofen (ADVIL) 200 MG tablet Take 200 mg by mouth every 6 (six) hours as needed.    ? levofloxacin (LEVAQUIN) 750 MG tablet Take 1 tablet (750 mg total) by mouth daily. (Patient not taking: Reported on 08/08/2021) 5 tablet 0  ? losartan-hydrochlorothiazide (HYZAAR) 100-12.5 MG tablet Take 1 tablet by mouth daily.    ? Naphazoline-Glycerin (REDNESS RELIEF OP) Place 1 drop into both eyes daily as needed (for redness/itching).    ? PROAIR HFA 108 (90 Base) MCG/ACT inhaler Inhale 2 puffs into the lungs every 4 (four) hours as needed for shortness of breath.    ? rosuvastatin (CRESTOR) 10 MG tablet Take 1 tablet by mouth once daily 90 tablet 2  ? triamcinolone (KENALOG) 0.1 % Apply 1 application topically. 2-3 times daily    ? ?No current facility-administered medications for this encounter.  ? ? ?Physical Findings: ?The patient is in no acute distress. Patient is alert and oriented.  He is accompanied by his daughter on evaluation today. ? height is 5\' 11"  (1.803 m) and weight is 190 lb 6.4 oz (86.4 kg). His temperature is 97.8 ?F (36.6 ?C). His blood pressure is 197/84 (abnormal) and his pulse is 54 (abnormal). His respiration is 20 and oxygen saturation is 98%. Marland Kitchen  Lungs are clear to auscultation bilaterally. Heart has regular rate and rhythm. No palpable cervical, supraclavicular, or axillary adenopathy. Abdomen soft, non-tender, normal bowel sounds.  ? ? ?Lab Findings: ?Lab Results  ?Component Value Date  ? WBC 14.3 07/15/2021  ? HGB 13.3 (A) 07/15/2021  ? HCT 39 (A) 07/15/2021  ? MCV 90 08/19/2020  ? PLT 238 07/15/2021  ? ? ?Radiographic Findings: ?CT CHEST WO CONTRAST ? ?Result Date: 11/07/2021 ?CLINICAL DATA:  Non-small cell lung cancer, non metastatic. Assess treatment response. Initial diagnosis in 2020. Radiation therapy completed. * onc * EXAM: CT CHEST WITHOUT CONTRAST TECHNIQUE: Multidetector CT imaging  of the chest was performed following the standard protocol without IV contrast. RADIATION DOSE REDUCTION: This exam was performed according to the departmental dose-optimization program which includes automated exposure control, adjustment of the mA and/or kV according to patient size and/or use of iterative reconstruction technique. COMPARISON:  Chest CT 08/14/2021, 05/02/2021 and 10/22/2010. FINDINGS: Cardiovascular: Extensive atherosclerosis of the aorta, great vessels and coronary arteries status post median sternotomy, CABG and TAVR. There are prominent calcifications of the mitral annulus. The heart size is normal. There is no pericardial effusion. Mediastinum/Nodes: There are no enlarged mediastinal, hilar or axillary lymph nodes.Small mediastinal lymph nodes are unchanged. Hilar assessment is limited by the lack of intravenous contrast, although the hilar contours appear unchanged. The thyroid gland appears stable without significant findings. There is a stable small hiatal hernia. Lungs/Pleura: No pleural effusion or pneumothorax. Mild centrilobular emphysema with diffuse central airway thickening. Chronic mass-like architectural distortion and volume loss in the right upper lobe and superior segment of the right lower lobe are unchanged. As measured in a similar fashion to the previous study, this currently measures 5.7 x 3.8 cm on image 22/5 (previously 6.2 x 3.9 cm). Part solid right lower lobe nodule shows continued growth and increasing solid components, currently measuring approximately 2.2 x 1.2 cm on image 100/5 (remeasured at the same level approximately 1.8 x 0.9 cm previously). Increasing densities dependently in the left lower lobe are predominately linear and may reflect atelectasis or scarring. There is a small nodular component measuring up to 1.6 cm on image 111/5. Additional scattered small pulmonary nodules bilaterally are unchanged. Upper abdomen: The visualized upper abdomen appears  stable without significant findings. There are bilateral renal cysts. Musculoskeletal/Chest wall: There is no chest wall mass or suspicious osseous finding. Stable chronic rib fractures on the right, sequela of previous median sternotomy and thoracic spondylosis. IMPRESSION: 1. Enlarging and increasingly solid right lower lobe pulmonary nodule suspicious for metachronous lung cancer (likely adenocarcinoma). The solid components are borderline in size for evaluation by PET-CT which is recommended at this time. 2. The treated right upper lobe lesion appears unchanged. 3. No evidence of metastatic disease. Enlarging dependent densities at the left lung base are likely postinflammatory. 4. Coronary and aortic atherosclerosis (ICD10-I70.0). Emphysema (ICD10-J43.9). Electronically Signed   By: Richardean Sale M.D.   On: 11/07/2021 13:13  ? ?NM PET Image Restag (PS) Skull Base To Thigh ? ?Result Date: 11/22/2021 ?CLINICAL DATA:  Subsequent treatment strategy for non-small-cell lung cancer. EXAM: NUCLEAR MEDICINE PET SKULL BASE TO THIGH TECHNIQUE: Not 0.3 mCi F-18 FDG was injected intravenously. Full-ring PET imaging was performed from the skull base to thigh after the radiotracer. CT data was obtained and used for attenuation correction and anatomic localization. Fasting blood glucose: 149 mg/dl COMPARISON:  Chest CT 11/07/2021.  PET-CT 01/07/2020 FINDINGS: Mediastinal blood pool activity: SUV max 2.8 Liver activity: SUV max  NA NECK: No hypermetabolic lymph nodes in the neck. Incidental CT findings: none CHEST: Heterogeneous low level uptake is identified in the posterior right upper lobe region of post treatment opacity. SUV max = 3.7 today compared to 4.6 on 01/07/2020. Imaging features compatible with scarring. Right lower lobe lesion of concern on 11/07/2021 CT scan shows low level FDG accumulation with SUV max = 3.0 No hypermetabolic mediastinal or hilar lymphadenopathy. Similar appearance of FDG accumulation in the  hiatal hernia and gastric wall. Incidental CT findings: Status post TAVR. Mitral annular calcification evident. Coronary artery calcification is evident. Status post CABG. Moderate atherosclerotic calcificatio

## 2021-11-24 ENCOUNTER — Encounter: Payer: Self-pay | Admitting: Radiation Oncology

## 2021-11-24 ENCOUNTER — Other Ambulatory Visit: Payer: Self-pay

## 2021-11-24 ENCOUNTER — Ambulatory Visit
Admission: RE | Admit: 2021-11-24 | Discharge: 2021-11-24 | Disposition: A | Discharge status: Home / Self Care | Payer: Medicare Other | Source: Ambulatory Visit | Attending: Radiation Oncology | Admitting: Radiation Oncology

## 2021-11-24 DIAGNOSIS — J432 Centrilobular emphysema: Secondary | ICD-10-CM | POA: Insufficient documentation

## 2021-11-24 DIAGNOSIS — Z951 Presence of aortocoronary bypass graft: Secondary | ICD-10-CM | POA: Diagnosis not present

## 2021-11-24 DIAGNOSIS — Z79899 Other long term (current) drug therapy: Secondary | ICD-10-CM | POA: Insufficient documentation

## 2021-11-24 DIAGNOSIS — C3411 Malignant neoplasm of upper lobe, right bronchus or lung: Secondary | ICD-10-CM | POA: Insufficient documentation

## 2021-11-24 DIAGNOSIS — Z7984 Long term (current) use of oral hypoglycemic drugs: Secondary | ICD-10-CM | POA: Insufficient documentation

## 2021-11-24 DIAGNOSIS — Z7901 Long term (current) use of anticoagulants: Secondary | ICD-10-CM | POA: Diagnosis not present

## 2021-11-24 DIAGNOSIS — K449 Diaphragmatic hernia without obstruction or gangrene: Secondary | ICD-10-CM | POA: Insufficient documentation

## 2021-11-24 DIAGNOSIS — S2241XA Multiple fractures of ribs, right side, initial encounter for closed fracture: Secondary | ICD-10-CM | POA: Insufficient documentation

## 2021-11-24 DIAGNOSIS — R911 Solitary pulmonary nodule: Secondary | ICD-10-CM | POA: Diagnosis not present

## 2021-11-24 DIAGNOSIS — N281 Cyst of kidney, acquired: Secondary | ICD-10-CM | POA: Diagnosis not present

## 2021-11-24 DIAGNOSIS — C3491 Malignant neoplasm of unspecified part of right bronchus or lung: Secondary | ICD-10-CM

## 2021-11-24 NOTE — Progress Notes (Signed)
Gurshaan Matsuoka is here today for follow up post radiation to the lung. ? ?Lung Side: Right, Patient completed treatment on 06/10/19. ? ?Does the patient complain of any of the following: ?Pain:No ?Shortness of breath w/wo exertion: on exertion. ?Cough: yes  ?Hemoptysis: no ?Pain with swallowing: no ?Swallowing/choking concerns: no ?Appetite: Good ?Weight:-  ?Wt Readings from Last 3 Encounters:  ?11/24/21 190 lb 6.4 oz (86.4 kg)  ?11/17/21 190 lb 4 oz (86.3 kg)  ?08/08/21 188 lb 6.4 oz (85.5 kg)  ?  ?Energy Level: low energy level.  ?Post radiation skin Changes: no ? ? ? ?Additional comments if applicable:  ? ?Vitals:  ? 11/24/21 1445  ?BP: (!) 197/84  ?Pulse: (!) 54  ?Resp: 20  ?Temp: 97.8 ?F (36.6 ?C)  ?SpO2: 98%  ?Weight: 190 lb 6.4 oz (86.4 kg)  ?Height: 5\' 11"  (1.803 m)  ?  ?

## 2021-11-25 ENCOUNTER — Other Ambulatory Visit: Payer: Self-pay

## 2021-11-25 ENCOUNTER — Telehealth: Payer: Self-pay | Admitting: *Deleted

## 2021-11-25 DIAGNOSIS — C3491 Malignant neoplasm of unspecified part of right bronchus or lung: Secondary | ICD-10-CM

## 2021-11-25 NOTE — Telephone Encounter (Signed)
CALLED PATIENT TO INFORM OF APPT. WITH DR. ICARD ON 11-30-21- ARRIVAL TIME- 10:15 AM - ADDRESS- [address] , [address], PHONE- [phone], SPOKE WITH PATIENT AND HE IS AWARE OF THIS APPT. ?

## 2021-11-30 ENCOUNTER — Encounter: Payer: Self-pay | Admitting: Emergency Medicine

## 2021-11-30 ENCOUNTER — Other Ambulatory Visit: Payer: Self-pay

## 2021-11-30 ENCOUNTER — Ambulatory Visit: Payer: Medicare Other | Admitting: Emergency Medicine

## 2021-11-30 VITALS — BP 142/78 | HR 54 | Temp 97.5°F | Ht 71.0 in | Wt 191.2 lb

## 2021-11-30 DIAGNOSIS — J411 Mucopurulent chronic bronchitis: Secondary | ICD-10-CM | POA: Diagnosis not present

## 2021-11-30 DIAGNOSIS — Z952 Presence of prosthetic heart valve: Secondary | ICD-10-CM

## 2021-11-30 DIAGNOSIS — R911 Solitary pulmonary nodule: Secondary | ICD-10-CM | POA: Diagnosis not present

## 2021-11-30 DIAGNOSIS — M720 Palmar fascial fibromatosis [Dupuytren]: Secondary | ICD-10-CM | POA: Diagnosis not present

## 2021-11-30 NOTE — Progress Notes (Signed)
? ?Subjective:  ? ? Patient ID: Christopher Rocha, male    DOB: [birth year], 81 y.o.   MRN: [id number] ? ?HPI ?81 year old gentleman (hx of 30 + pk/yrs) history of of COPD, CAD/CABG, hypertension with diastolic CHF, atrial fibrillation, severe AS post TAVR.  He has a history of stage IIA right upper lobe adenocarcinoma, followed by Dr. Sondra Come in radiation oncology.  He is here for an abnormal CT scan of the chest. He is now off Eliquis. Had been seen most recently by Dr Geraldo Pitter, wants to see Kathlene November at Cardiology.  ? ?CT chest 11/07/2021 reviewed by me, shows stable treated right upper lobe lesion, an enlarging and increasingly solid right lower lobe pulmonary nodule suspicious for adenocarcinoma, now 5.7 x 3.8 cm with a 2.2 cm solid component.   ? ?PET scan performed on 11/21/2021 reviewed by me shows low-level FDG activity in the right lower lobe pulmonary nodule.  No evidence of hypermetabolic metastatic disease or adenopathy. ? ? ?Review of Systems ?As per HPI ? ?Past Medical History:  ?Diagnosis Date  ? Acute on chronic diastolic heart failure ([place]) 07/02/2018  ? Adenocarcinoma, lung, right ([place]) 10/27/2019  ? Anemia   ? iron deficiency   ? CAD (coronary artery disease) 03/25/2018  ? COPD (chronic obstructive pulmonary disease) ([place]) 06/14/2018  ? 05/2018 FEV1 60%, DLCO 53%  ? Coronary artery disease   ? Essential hypertension 03/25/2018  ? History of radiation therapy 06/10/2019  ? right lung 06/03/2019-06/10/2019  Dr Gery Pray  ? HLD (hyperlipidemia)   ? HTN (hypertension)   ? Hx of CABG 03/25/2018  ? Mixed dyslipidemia 03/25/2018  ? Pulmonary nodule   ? S/P CABG (coronary artery bypass graft)   ? S/P TAVR (transcatheter aortic valve replacement)   ? Edwards Sapien XT THV (size 29 mm, model # 9300TFX, serial # [id number])  ? Severe aortic stenosis   ? Unspecified atrial fibrillation ([place]) 08/19/2020  ?  ? ?Family History  ?Problem Relation Age of Onset  ? Diabetes Mother   ? Clotting disorder Father   ?   ? ?Social History  ? ?Socioeconomic History  ? Marital status: Widowed  ?  Spouse name: Not on file  ? Number of children: Not on file  ? Years of education: Not on file  ? Highest education level: Not on file  ?Occupational History  ? Not on file  ?Tobacco Use  ? Smoking status: Former  ?  Packs/day: 2.00  ?  Types: Cigarettes  ?  Quit date: 02/16/1988  ?  Years since quitting: 33.8  ? Smokeless tobacco: Never  ?Vaping Use  ? Vaping Use: Never used  ?Substance and Sexual Activity  ? Alcohol use: Not Currently  ? Drug use: Never  ? Sexual activity: Not on file  ?Other Topics Concern  ? Not on file  ?Social History Narrative  ? Not on file  ? ?Social Determinants of Health  ? ?Financial Resource Strain: Not on file  ?Food Insecurity: Not on file  ?Transportation Needs: Not on file  ?Physical Activity: Not on file  ?Stress: Not on file  ?Social Connections: Not on file  ?Intimate Partner Violence: Not on file  ?  ? ?Allergies  ?Allergen Reactions  ? Penicillins Hives and Rash  ?  Did it involve swelling of the face/tongue/throat, SOB, or low BP? No ?Did it involve sudden or severe rash/hives, skin peeling, or any reaction on the inside of your mouth or nose? No ?Did you need to seek  medical attention at a hospital or doctor's office? No ?When did it last happen?      10-15 years ?If all above answers are "NO", may proceed with cephalosporin use. ? ?  ?  ? ?Outpatient Medications Prior to Visit  ?Medication Sig Dispense Refill  ? acetaminophen (TYLENOL) 500 MG tablet Take 500 mg by mouth every 8 (eight) hours as needed (pain).    ? aspirin EC 81 MG tablet Take 81 mg by mouth every other day.     ? glyBURIDE (DIABETA) 1.25 MG tablet Take 1.25 mg by mouth daily.    ? ibuprofen (ADVIL) 200 MG tablet Take 200 mg by mouth every 6 (six) hours as needed.    ? levofloxacin (LEVAQUIN) 750 MG tablet Take 1 tablet (750 mg total) by mouth daily. 5 tablet 0  ? losartan-hydrochlorothiazide (HYZAAR) 100-12.5 MG tablet Take 1 tablet  by mouth daily.    ? Naphazoline-Glycerin (REDNESS RELIEF OP) Place 1 drop into both eyes daily as needed (for redness/itching).    ? PROAIR HFA 108 (90 Base) MCG/ACT inhaler Inhale 2 puffs into the lungs every 4 (four) hours as needed for shortness of breath.    ? rosuvastatin (CRESTOR) 10 MG tablet Take 1 tablet by mouth once daily 90 tablet 2  ? triamcinolone (KENALOG) 0.1 % Apply 1 application topically. 2-3 times daily    ? apixaban (ELIQUIS) 5 MG TABS tablet Take 1 tablet (5 mg total) by mouth 2 (two) times daily. (Patient not taking: Reported on 11/30/2021) 60 tablet 12  ? budesonide-formoterol (SYMBICORT) 160-4.5 MCG/ACT inhaler Inhale 2 puffs into the lungs 2 (two) times daily. (Patient not taking: Reported on 11/30/2021) 1 Inhaler 5  ? ?No facility-administered medications prior to visit.  ? ? ? ? ?   ?Objective:  ? Physical Exam ? ?Vitals:  ? 11/30/21 1015  ?BP: (!) 142/78  ?Pulse: (!) 54  ?Temp: (!) 97.5 ?F (36.4 ?C)  ?TempSrc: Oral  ?SpO2: 98%  ?Weight: 191 lb 3.2 oz (86.7 kg)  ?Height: 5\' 11"  (1.803 m)  ? ?Gen: Pleasant, well-nourished, in no distress,  normal affect ? ?ENT: No lesions,  mouth clear,  oropharynx clear, no postnasal drip ? ?Neck: No JVD, no stridor ? ?Lungs: No use of accessory muscles, distant, no crackles or wheezing on normal respiration, no wheeze on forced expiration ? ?Cardiovascular: RRR, 3/6 syst M ? ?Musculoskeletal: No deformities, no cyanosis or clubbing ? ?Neuro: alert, awake, non focal ? ?Skin: Warm, no lesions or rash ? ? ?   ?Assessment & Plan:  ?Pulmonary nodule ?Enlarging mixed density right lower lobe pulmonary nodule, low-grade hypermetabolism on PET, suspect that this is another adenocarcinoma.  We talked about navigational bronchoscopy and he agrees to proceed.  We will obtain biopsies, place fiducial marker to facilitate SBRT. ? ?S/P TAVR (transcatheter aortic valve replacement) ?Currently off Eliquis.  He was following with Dr. Geraldo Pitter but did not go back.  He  needs to reestablish.  Question whether he should be back on Eliquis.  He wants to go back to see Angelena Form and we will try to arrange.  I will see if I can get him a visit before scheduled bronchoscopy 4/17 to ensure no cardiac barriers ? ?COPD (chronic obstructive pulmonary disease) ([place]) ?Not currently on BD therapy.  We can discuss possibly restarting at future visit. ? ? ?Baltazar Apo, MD, PhD ?11/30/2021, 11:17 AM ?[facility] ?[phone] or if no answer before 7:00PM call [phone] ?For any issues  after 7:00PM please call eLink [phone] ? ? ?

## 2021-11-30 NOTE — Assessment & Plan Note (Signed)
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  We talked about navigational bronchoscopy and he agrees to proceed.  We will obtain biopsies, place fiducial marker to facilitate SBRT. ?

## 2021-11-30 NOTE — Assessment & Plan Note (Signed)
Currently off Eliquis.  He was following with Dr. Geraldo Pitter but did not go back.  He needs to reestablish.  Question whether he should be back on Eliquis.  He wants to go back to see Angelena Form and we will try to arrange.  I will see if I can get him a visit before scheduled bronchoscopy 4/17 to ensure no cardiac barriers ?

## 2021-11-30 NOTE — Assessment & Plan Note (Addendum)
Not currently on BD therapy.  We can discuss possibly restarting at future visit. ?

## 2021-11-30 NOTE — Patient Instructions (Addendum)
We reviewed your CT scan and your PET scan today.  We will work on setting up navigational bronchoscopy to evaluate your right lower lobe pulmonary nodule.  This would be done as an outpatient under general anesthesia at [facility] endoscopy.  You will need a designated driver.  You will need to stop your aspirin 2 days prior.  We will try to get this set up for 12/19/2021. ?We will refer you back to see Angelena Form with cardiology given your history of coronary disease, valve disease. ?We will discuss any possible benefit that may be gained by restarting inhaler medication at your next visit. ?Follow Dr. Lamonte Sakai in 1 month or sooner if you have any problems. ?

## 2021-12-07 DIAGNOSIS — M25642 Stiffness of left hand, not elsewhere classified: Secondary | ICD-10-CM | POA: Diagnosis not present

## 2021-12-14 ENCOUNTER — Telehealth: Payer: Self-pay | Admitting: Cardiology

## 2021-12-14 ENCOUNTER — Telehealth: Payer: Self-pay | Admitting: Emergency Medicine

## 2021-12-14 DIAGNOSIS — M25642 Stiffness of left hand, not elsewhere classified: Secondary | ICD-10-CM | POA: Diagnosis not present

## 2021-12-14 NOTE — Telephone Encounter (Signed)
Primary card looks like is Dr. Geraldo Pitter. I will send message to [place] office to see if they can get this pt in before the procedure of 12/19/21.  ?I will send FYI to requesting office the pt will need an appt. Procedure may need to be postponed until seen by cardiology.  ?

## 2021-12-14 NOTE — Telephone Encounter (Signed)
[place] office to try to get patient  seen in the office before Monday.  ? ?I told nurse that per Dr Sudie Bailey note: ? ?Currently off Eliquis.  He was following with Dr. Geraldo Pitter but did not go back.  He needs to reestablish.  Question whether he should be back on Eliquis.  He wants to go back to see Angelena Form and we will try to arrange.  I will see if I can get him a visit before scheduled bronchoscopy 4/17 to ensure no cardiac barriers. ? ?They are going to call patient and see what needs to be done for the patient before his procedure on Monday. ? ?Nothing further needed at this time  ?

## 2021-12-14 NOTE — Telephone Encounter (Signed)
Primary Cardiologist:None ? ?Chart reviewed as part of pre-operative protocol coverage. Because of Hazem Kenner Holaway's past medical history and time since last visit, he/she will require a follow-up visit in order to better assess preoperative cardiovascular risk. ? ?Pre-op covering staff: ?- Please schedule appointment and call patient to inform them. ?- Please contact requesting surgeon's office via preferred method (i.e, phone, fax) to inform them of need for appointment prior to surgery. ? ?If applicable, this message will also be routed to pharmacy pool and/or primary cardiologist for input on holding anticoagulant/antiplatelet agent as requested below so that this information is available at time of patient's appointment.  ? ?Emmaline Life, NP-C ? ?  ?12/14/2021, 4:56 PM ?[place] ?[address] ?Office [phone] Fax [phone] ? ?

## 2021-12-14 NOTE — Telephone Encounter (Signed)
? ?   [facility] Pre-operative Risk Assessment  ?  ?Request for surgical clearance: ? ?What type of surgery is being performed?  ?Robotic Assisted Bronchoscopy w/ Biopsy  ? ?When is this surgery scheduled?  ?12/19/21  ? ?What type of clearance is required (medical clearance vs. Pharmacy clearance to hold med vs. Both)?  ?Both  ? ?Are there any medications that need to be held prior to surgery and how long? ?Their office is requesting our advisement regarding patient's blood thinner   ? ?Practice name and name of physician performing surgery?  ?[facility] Dr. Lamonte Sakai   ? ?What is your office phone number? ? [phone] ?  ?7.   What is your office fax number? ?[phone] ? ?8.   Anesthesia type (None, local, MAC, general) ?  ?General ? ? ?Zara Council ?12/14/2021, 4:42 PM  ? ?

## 2021-12-15 NOTE — Telephone Encounter (Signed)
OK thank you. Not much time. Let me know if I need to reschedule him.  ?

## 2021-12-15 NOTE — Telephone Encounter (Signed)
Pt has been scheduled to see Dr. Lennox Pippins, 12/16/21 11:20, clearance will be addressed at that time.  ? ?Will route back to the requesting surgeon's office to make them aware.  ?

## 2021-12-16 ENCOUNTER — Encounter (HOSPITAL_COMMUNITY): Payer: Self-pay | Admitting: Physician Assistant

## 2021-12-16 ENCOUNTER — Ambulatory Visit: Payer: Medicare Other | Admitting: Cardiology

## 2021-12-16 ENCOUNTER — Other Ambulatory Visit: Payer: Self-pay | Admitting: Emergency Medicine

## 2021-12-16 ENCOUNTER — Encounter: Payer: Self-pay | Admitting: Cardiology

## 2021-12-16 ENCOUNTER — Telehealth: Payer: Self-pay | Admitting: Emergency Medicine

## 2021-12-16 VITALS — BP 180/78 | HR 69 | Ht 71.0 in | Wt 191.1 lb

## 2021-12-16 DIAGNOSIS — Z952 Presence of prosthetic heart valve: Secondary | ICD-10-CM | POA: Diagnosis not present

## 2021-12-16 DIAGNOSIS — E782 Mixed hyperlipidemia: Secondary | ICD-10-CM | POA: Diagnosis not present

## 2021-12-16 DIAGNOSIS — Z01810 Encounter for preprocedural cardiovascular examination: Secondary | ICD-10-CM | POA: Diagnosis not present

## 2021-12-16 DIAGNOSIS — I10 Essential (primary) hypertension: Secondary | ICD-10-CM

## 2021-12-16 DIAGNOSIS — I2510 Atherosclerotic heart disease of native coronary artery without angina pectoris: Secondary | ICD-10-CM

## 2021-12-16 DIAGNOSIS — Z951 Presence of aortocoronary bypass graft: Secondary | ICD-10-CM

## 2021-12-16 LAB — SARS CORONAVIRUS 2 (TAT 6-24 HRS): SARS Coronavirus 2: NEGATIVE

## 2021-12-16 NOTE — Telephone Encounter (Signed)
LMTCB for Christopher Rocha, pt's granddaughter  ? ?Wille Glaser with Preprocedure testing  ?She states pt needing echo and lexican per cards before bronch  ?Bronch that is scheduled for 12/19/21 will have to be rescheduled  ?Pt is scheduled for ECHO on 12/27/21 and lexiscan 12/29/21 ? ?Please advise, thanks! ?

## 2021-12-16 NOTE — Telephone Encounter (Signed)
Thank you.  It sounds like he needs some work-up before we can proceed.  I will postpone him for 4/17.  Plan to reschedule when appropriate to do so. ?

## 2021-12-16 NOTE — Progress Notes (Signed)
Spoke with Magda Paganini at Dr. Agustina Caroli office. Surgery will be rescheduled. ?

## 2021-12-16 NOTE — Progress Notes (Signed)
?Cardiology Office Note:   ? ?Date:  12/16/2021  ? ?ID:  Christopher Rocha, DOB [birth year], MRN [id number] ? ?PCP:  [name], MD  ?Cardiologist:  Jenean Lindau, MD  ? ?Referring MD: [name], MD  ? ? ?ASSESSMENT:   ? ?1. Coronary artery disease involving native coronary artery of native heart without angina pectoris   ?2. Essential hypertension   ?3. Primary hypertension   ?4. Mixed dyslipidemia   ?5. Hx of CABG   ?6. S/P TAVR (transcatheter aortic valve replacement)   ?7. S/P CABG (coronary artery bypass graft)   ? ?PLAN:   ? ?In order of problems listed above: ? ?Coronary artery disease post CABG surgery, secondary prevention stressed to the patient.  Importance of compliance with diet medication stressed any vocalized understanding. ?Post TAVR: Patient has not been very compliant with follow-up.  I would like to get an echocardiogram to assess valve function.  Subsequently I would like to assess him for coronary artery disease with a Lexiscan sestamibi. ?Preoperative restratification: Patient has significant comorbidities and I feel he would be best served by evaluating him with a Lexiscan sestamibi to assess status for this procedure.  He is agreeable.  We will set him up for this next week. ?History of smoking and COPD: Managed by primary care.  This increases the risks of these procedures.  There is also history of dementia in this patient in the chart.  I am not clear about the status.  This is managed by his primary care. ?Atrial fibrillation with well-controlled ventricular rate: Patient has not been very compliant with follow-ups.  He has quit taking anticoagulation on his own and is not keen on taking this medication due to the risks.  I respect his wishes for now. ?Further recommendations will be made based on the findings of the aforementioned test.  Patient and granddaughter had multiple questions which were answered to their satisfaction. ? ? ?Medication Adjustments/Labs and Tests  Ordered: ?Current medicines are reviewed at length with the patient today.  Concerns regarding medicines are outlined above.  ?No orders of the defined types were placed in this encounter. ? ?No orders of the defined types were placed in this encounter. ? ? ? ?No chief complaint on file. ?  ? ?History of Present Illness:   ? ?Christopher Rocha is a 81 y.o. male.  Patient has past medical history of coronary artery disease post CABG surgery, TAVR surgery, essential hypertension, dyslipidemia, COPD and atrial fibrillation.  Patient is off anticoagulation for unclear reasons.  He tells me that he could not tolerate the medication.  He has been now evaluated for recurrence of lung cancer and a marker placement with bronchoscopy and possible surgery has been contemplated.  He denies any chest pain orthopnea or PND.  He appears to be frail.  At the time of my evaluation, the patient is alert awake oriented and in no distress. ? ?Past Medical History:  ?Diagnosis Date  ? Acute on chronic diastolic heart failure ([place]) 07/02/2018  ? 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 Anemia   ? iron deficiency   ? CAD (coronary artery disease) 03/25/2018  ? COPD (chronic obstructive pulmonary disease) ([place]) 06/14/2018  ? 05/2018 FEV1 60%, DLCO 53%  ? Coronary artery disease   ? Essential hypertension 03/25/2018  ? History of radiation therapy 06/10/2019  ? right lung 06/03/2019-06/10/2019  Dr Gery Pray  ? HLD (hyperlipidemia)   ? Hx of CABG 03/25/2018  ? Iron  deficiency anemia 01/12/2021  ? Mixed dyslipidemia 03/25/2018  ? Pulmonary nodule   ? S/P CABG (coronary artery bypass graft)   ? S/P TAVR (transcatheter aortic valve replacement)   ? Edwards Sapien XT THV (size 29 mm, model # 9300TFX, serial # [id number])  ? Severe aortic stenosis   ? Unspecified atrial fibrillation ([place]) 08/19/2020  ? ? ?Past Surgical History:  ?Procedure Laterality Date  ? BONE MARROW ASPIRATION    ? CARDIAC CATHETERIZATION    ? CARDIAC SURGERY    ?  COLONOSCOPY    ? CORONARY ARTERY BYPASS GRAFT    ? EYE SURGERY    ? bilateral cataracts  ? FIBEROPTIC BRONCHOSCOPY  04/08/2019  ?    ? RIGHT/LEFT HEART CATH AND CORONARY/GRAFT ANGIOGRAPHY N/A 05/08/2018  ? Procedure: RIGHT/LEFT HEART CATH AND CORONARY/GRAFT ANGIOGRAPHY;  Surgeon: Wellington Hampshire, MD;  Location: [facility];  Service: Cardiovascular;  Laterality: N/A;  ? TEE WITHOUT CARDIOVERSION  07/02/2018  ? Procedure: TRANSESOPHAGEAL ECHOCARDIOGRAM (TEE);  Surgeon: Sherren Mocha, MD;  Location: [facility];  Service: Open Heart Surgery;;  ? TRANSCATHETER AORTIC VALVE REPLACEMENT, TRANSAPICAL N/A 07/02/2018  ? Procedure: TRANSCATHETER AORTIC VALVE REPLACEMENT, TRANSAPICAL;  Surgeon: Sherren Mocha, MD;  Location: [facility];  Service: Open Heart Surgery;  Laterality: N/A;  ? VIDEO BRONCHOSCOPY Bilateral 05/31/2018  ? Procedure: VIDEO BRONCHOSCOPY WITH FLUORO;  Surgeon: Rigoberto Noel, MD;  Location: [facility];  Service: Cardiopulmonary;  Laterality: Bilateral;  ? VIDEO BRONCHOSCOPY WITH ENDOBRONCHIAL NAVIGATION N/A 04/08/2019  ? Procedure: VIDEO BRONCHOSCOPY WITH ENDOBRONCHIAL NAVIGATION;  Surgeon: Rigoberto Noel, MD;  Location: [facility];  Service: Thoracic;  Laterality: N/A;  ? VIDEO BRONCHOSCOPY WITH ENDOBRONCHIAL ULTRASOUND N/A 04/08/2019  ? Procedure: Video Bronchoscopy With Endobronchial Ultrasound;  Surgeon: Rigoberto Noel, MD;  Location: [facility];  Service: Thoracic;  Laterality: N/A;  ? WISDOM TOOTH EXTRACTION    ? ? ?Current Medications: ?Current Meds  ?Medication Sig  ? acetaminophen (TYLENOL) 500 MG tablet Take 500 mg by mouth every 8 (eight) hours as needed (pain).  ? aspirin EC 81 MG tablet Take 81 mg by mouth every other day.   ? budesonide-formoterol (SYMBICORT) 160-4.5 MCG/ACT inhaler Inhale 2 puffs into the lungs 2 (two) times daily.  ? ibuprofen (ADVIL) 200 MG tablet Take 200 mg by mouth every 6 (six) hours as needed for mild pain.  ? losartan-hydrochlorothiazide (HYZAAR) 100-12.5 MG tablet Take 1 tablet by  mouth daily.  ? Naphazoline-Glycerin (REDNESS RELIEF OP) Place 1 drop into both eyes daily as needed (for redness/itching).  ? PROAIR HFA 108 (90 Base) MCG/ACT inhaler Inhale 2 puffs into the lungs every 4 (four) hours as needed for shortness of breath.  ? rosuvastatin (CRESTOR) 10 MG tablet Take 1 tablet by mouth once daily  ? triamcinolone (KENALOG) 0.1 % Apply 1 application. topically 3 (three) times daily as needed (itching).  ?  ? ?Allergies:   Penicillins  ? ?Social History  ? ?Socioeconomic History  ? Marital status: Widowed  ?  Spouse name: Not on file  ? Number of children: Not on file  ? Years of education: Not on file  ? Highest education level: Not on file  ?Occupational History  ? Not on file  ?Tobacco Use  ? Smoking status: Former  ?  Packs/day: 2.00  ?  Types: Cigarettes  ?  Quit date: 02/16/1988  ?  Years since quitting: 33.8  ? Smokeless tobacco: Never  ?Vaping Use  ? Vaping Use: Never used  ?Substance  and Sexual Activity  ? Alcohol use: Not Currently  ? Drug use: Never  ? Sexual activity: Not on file  ?Other Topics Concern  ? Not on file  ?Social History Narrative  ? Not on file  ? ?Social Determinants of Health  ? ?Financial Resource Strain: Not on file  ?Food Insecurity: Not on file  ?Transportation Needs: Not on file  ?Physical Activity: Not on file  ?Stress: Not on file  ?Social Connections: Not on file  ?  ? ?Family History: ?The patient's family history includes Clotting disorder in his father; Diabetes in his mother. ? ?ROS:   ?Please see the history of present illness.    ?All other systems reviewed and are negative. ? ?EKGs/Labs/Other Studies Reviewed:   ? ?The following studies were reviewed today: ?I discussed my findings with the patient at length.  EKG reveals atrial fibrillation with well-controlled ventricular rate. ? ? ?Recent Labs: ?01/12/2021: ALT 16; BUN 21; Creatinine 0.9; Potassium 4.1; Sodium 137 ?07/15/2021: Hemoglobin 13.3; Platelets 238  ?Recent Lipid Panel ?   ?Component  Value Date/Time  ? CHOL 178 08/19/2019 1406  ? TRIG 101 08/19/2019 1406  ? HDL 44 08/19/2019 1406  ? CHOLHDL 4.0 08/19/2019 1406  ? LDLCALC 116 (H) 08/19/2019 1406  ? ? ?Physical Exam:   ? ?VS:  BP (!) 180/78

## 2021-12-16 NOTE — Telephone Encounter (Signed)
Attempted to call pt's granddaughter Nunzio Cobbs but unable to reach. Left message for her to return call. ?

## 2021-12-16 NOTE — Telephone Encounter (Signed)
Please let her know that we will postpone the bronchoscopy for 4/17, await the testing and recommendations from cardiology and then I will get him rescheduled to soon as possible assuming it is safe to proceed. ?

## 2021-12-16 NOTE — Telephone Encounter (Signed)
Pt's granddaughter Nunzio Cobbs returned call. Stated to [place] per RB and she verbalized understanding. Nothing further needed. ?

## 2021-12-16 NOTE — Telephone Encounter (Signed)
Christopher Rocha from [facility] Preprocedure Testing calling- pt saw Dr Geraldo Pitter today and it says "more cardiac testing" in the notes "stress test next week". Pt is scheduled for bronchon Monday with RB. Christopher Rocha asking if the bronch needs to be rescheduled. Please advise [phone] ?

## 2021-12-16 NOTE — Patient Instructions (Signed)
Medication Instructions:  ?Your physician recommends that you continue on your current medications as directed. Please refer to the Current Medication list given to you today. ? ?*If you need a refill on your cardiac medications before your next appointment, please call your pharmacy* ? ? ?Lab Work: ?None ordered ?If you have labs (blood work) drawn today and your tests are completely normal, you will receive your results only by: ?MyChart Message (if you have MyChart) OR ?A paper copy in the mail ?If you have any lab test that is abnormal or we need to change your treatment, we will call you to review the results. ? ? ?Testing/Procedures: ?Your physician has requested that you have a lexiscan myoview. For further information please visit [email or web address]. Please follow instruction sheet, as given. ? ?The test will take approximately 3 to 4 hours to complete; you may bring reading material.  If someone comes with you to your appointment, they will need to remain in the main lobby due to limited space in the testing area.  ? ?How to prepare for your Myocardial Perfusion Test: ?Do not eat or drink 3 hours prior to your test, except you may have water. ?Do not consume products containing caffeine (regular or decaffeinated) 12 hours prior to your test. (ex: coffee, chocolate, sodas, tea). ?Do bring a list of your current medications with you.  If not listed below, you may take your medications as normal. ?Do wear comfortable clothes (no dresses or overalls) and walking shoes, tennis shoes preferred (No heels or open toe shoes are allowed). ?Do NOT wear cologne, perfume, aftershave, or lotions (deodorant is allowed). ?If these instructions are not followed, your test will have to be rescheduled. ? ?Your physician has requested that you have an echocardiogram. Echocardiography is a painless test that uses sound waves to create images of your heart. It provides your doctor with information about the size and shape of  your heart and how well your heart?s chambers and valves are working. This procedure takes approximately one hour. There are no restrictions for this procedure. ? ? ?Follow-Up: ?At [facility], you and your health needs are our priority.  As part of our continuing mission to provide you with exceptional heart care, we have created designated Provider Care Teams.  These Care Teams include your primary Cardiologist (physician) and Advanced Practice Providers (APPs -  Physician Assistants and Nurse Practitioners) who all work together to provide you with the care you need, when you need it. ? ?We recommend signing up for the patient portal called "MyChart".  Sign up information is provided on this After Visit Summary.  MyChart is used to connect with patients for Virtual Visits (Telemedicine).  Patients are able to view lab/test results, encounter notes, upcoming appointments, etc.  Non-urgent messages can be sent to your provider as well.   ?To learn more about what you can do with MyChart, go to [email or web address].   ? ?Your next appointment:   ?6 month(s) ? ?The format for your next appointment:   ?In Person ? ?Provider:   ?Jyl Heinz, MD ? ? ?Other Instructions ?Cardiac Nuclear Scan ?A cardiac nuclear scan is a test that is done to check the flow of blood to your heart. It is done when you are resting and when you are exercising. The test looks for problems such as: ?Not enough blood reaching a portion of the heart. ?The heart muscle not working as it should. ?You may need this test if: ?You have heart disease. ?You have  had lab results that are not normal. ?You have had heart surgery or a balloon procedure to open up blocked arteries (angioplasty). ?You have chest pain. ?You have shortness of breath. ?In this test, a special dye (tracer) is put into your bloodstream. The tracer will travel to your heart. A camera will then take pictures of your heart to see how the tracer moves through your heart. This  test is usually done at a hospital and takes 2-4 hours. ?Tell a doctor about: ?Any allergies you have. ?All medicines you are taking, including vitamins, herbs, eye drops, creams, and over-the-counter medicines. ?Any problems you or family members have had with anesthetic medicines. ?Any blood disorders you have. ?Any surgeries you have had. ?Any medical conditions you have. ?Whether you are pregnant or may be pregnant. ?What are the risks? ?Generally, this is a safe test. However, problems may occur, such as: ?Serious chest pain and heart attack. This is only a risk if the stress portion of the test is done. ?Rapid heartbeat. ?A feeling of warmth in your chest. This feeling usually does not last long. ?Allergic reaction to the tracer. ?What happens before the test? ?Ask your doctor about changing or stopping your normal medicines. This is important. ?Follow instructions from your doctor about what you cannot eat or drink. ?Remove your jewelry on the day of the test. ?What happens during the test? ?An IV tube will be inserted into one of your veins. ?Your doctor will give you a small amount of tracer through the IV tube. ?You will wait for 20-40 minutes while the tracer moves through your bloodstream. ?Your heart will be monitored with an electrocardiogram (ECG). ?You will lie down on an exam table. ?Pictures of your heart will be taken for about 15-20 minutes. ?You may also have a stress test. For this test, one of these things may be done: ?You will be asked to exercise on a treadmill or a stationary bike. ?You will be given medicines that will make your heart work harder. This is done if you are unable to exercise. ?When blood flow to your heart has peaked, a tracer will again be given through the IV tube. ?After 20-40 minutes, you will get back on the exam table. More pictures will be taken of your heart. ?Depending on the tracer that is used, more pictures may need to be taken 3-4 hours later. ?Your IV tube  will be removed when the test is over. ?The test may vary among doctors and hospitals. ?What happens after the test? ?Ask your doctor: ?Whether you can return to your normal schedule, including diet, activities, and medicines. ?Whether you should drink more fluids. This will help to remove the tracer from your body. Drink enough fluid to keep your pee (urine) pale yellow. ?Ask your doctor, or the department that is doing the test: ?When will my results be ready? ?How will I get my results? ?Summary ?A cardiac nuclear scan is a test that is done to check the flow of blood to your heart. ?Tell your doctor whether you are pregnant or may be pregnant. ?Before the test, ask your doctor about changing or stopping your normal medicines. This is important. ?Ask your doctor whether you can return to your normal activities. You may be asked to drink more fluids. ?This information is not intended to replace advice given to you by your health care provider. Make sure you discuss any questions you have with your health care provider. ?Document Revised: 12/11/2018 Document Reviewed: 02/04/2018 ?  Elsevier Patient Education ? [place]. ? ? ? ?Echocardiogram ?An echocardiogram is a test that uses sound waves (ultrasound) to produce images of the heart. ?Images from an echocardiogram can provide important information about: ?Heart size and shape. ?The size and thickness and movement of your heart's walls. ?Heart muscle function and strength. ?Heart valve function or if you have stenosis. Stenosis is when the heart valves are too narrow. ?If blood is flowing backward through the heart valves (regurgitation). ?A tumor or infectious growth around the heart valves. ?Areas of heart muscle that are not working well because of poor blood flow or injury from a heart attack. ?Aneurysm detection. An aneurysm is a weak or damaged part of an artery wall. The wall bulges out from the normal force of blood pumping through the body. ?Tell a  health care provider about: ?Any allergies you have. ?All medicines you are taking, including vitamins, herbs, eye drops, creams, and over-the-counter medicines. ?Any blood disorders you have. ?Any surge

## 2021-12-19 ENCOUNTER — Ambulatory Visit (HOSPITAL_COMMUNITY): Admission: RE | Admit: 2021-12-19 | Payer: Medicare Other | Source: Home / Self Care | Admitting: Emergency Medicine

## 2021-12-19 ENCOUNTER — Encounter (HOSPITAL_COMMUNITY): Admission: RE | Payer: Self-pay | Source: Home / Self Care

## 2021-12-19 SURGERY — BRONCHOSCOPY, WITH BIOPSY USING ELECTROMAGNETIC NAVIGATION
Anesthesia: General

## 2021-12-27 ENCOUNTER — Ambulatory Visit (INDEPENDENT_AMBULATORY_CARE_PROVIDER_SITE_OTHER): Payer: Medicare Other

## 2021-12-27 ENCOUNTER — Telehealth: Payer: Self-pay | Admitting: *Deleted

## 2021-12-27 DIAGNOSIS — Z952 Presence of prosthetic heart valve: Secondary | ICD-10-CM | POA: Diagnosis not present

## 2021-12-27 DIAGNOSIS — Z01810 Encounter for preprocedural cardiovascular examination: Secondary | ICD-10-CM

## 2021-12-27 DIAGNOSIS — I2510 Atherosclerotic heart disease of native coronary artery without angina pectoris: Secondary | ICD-10-CM

## 2021-12-27 DIAGNOSIS — Z951 Presence of aortocoronary bypass graft: Secondary | ICD-10-CM | POA: Diagnosis not present

## 2021-12-27 LAB — ECHOCARDIOGRAM COMPLETE
AR max vel: 2.23 cm2
AV Area VTI: 2.21 cm2
AV Area mean vel: 1.96 cm2
AV Mean grad: 11.6 mmHg
AV Peak grad: 22.6 mmHg
Ao pk vel: 2.38 m/s
Area-P 1/2: 2.44 cm2
S' Lateral: 4.2 cm

## 2021-12-27 MED ORDER — PERFLUTREN LIPID MICROSPHERE
1.0000 mL | INTRAVENOUS | Status: AC | PRN
  Administered 2021-12-27: 6 mL via INTRAVENOUS

## 2021-12-27 NOTE — Telephone Encounter (Signed)
Patient given detailed instructions per Myocardial Perfusion Study Information Sheet for the test on 12/29/21 at 0815. Patient notified to arrive 15 minutes early and that it is imperative to arrive on time for appointment to keep from having the test rescheduled. ? If you need to cancel or reschedule your appointment, please call the office within 24 hours of your appointment. . Patient verbalized understanding.Ervie Mccard, Ranae [place] ? ? ?

## 2021-12-29 ENCOUNTER — Ambulatory Visit (INDEPENDENT_AMBULATORY_CARE_PROVIDER_SITE_OTHER): Payer: Medicare Other

## 2021-12-29 DIAGNOSIS — Z01810 Encounter for preprocedural cardiovascular examination: Secondary | ICD-10-CM | POA: Diagnosis not present

## 2021-12-29 DIAGNOSIS — Z951 Presence of aortocoronary bypass graft: Secondary | ICD-10-CM

## 2021-12-29 DIAGNOSIS — I2510 Atherosclerotic heart disease of native coronary artery without angina pectoris: Secondary | ICD-10-CM | POA: Diagnosis not present

## 2021-12-29 LAB — MYOCARDIAL PERFUSION IMAGING
LV dias vol: 118 mL (ref 62–150)
LV sys vol: 55 mL
Nuc Stress EF: 53 %
Peak HR: 74 {beats}/min
Rest HR: 60 {beats}/min
Rest Nuclear Isotope Dose: 10.6 mCi
SDS: 1
SRS: 3
SSS: 4
ST Depression (mm): 0 mm
Stress Nuclear Isotope Dose: 30.2 mCi
TID: 0.88

## 2021-12-29 MED ORDER — TECHNETIUM TC 99M TETROFOSMIN IV KIT
10.6000 | PACK | Freq: Once | INTRAVENOUS | Status: AC | PRN
  Administered 2021-12-29: 10.6 via INTRAVENOUS

## 2021-12-29 MED ORDER — TECHNETIUM TC 99M TETROFOSMIN IV KIT
30.2000 | PACK | Freq: Once | INTRAVENOUS | Status: AC | PRN
  Administered 2021-12-29: 30.2 via INTRAVENOUS

## 2021-12-29 MED ORDER — REGADENOSON 0.4 MG/5ML IV SOLN
0.4000 mg | Freq: Once | INTRAVENOUS | Status: AC
  Administered 2021-12-29: 0.4 mg via INTRAVENOUS

## 2022-01-11 NOTE — Progress Notes (Signed)
[facility]  [address] [place],  [place]  [address] [phone]  Clinic Day:  01/12/2022  Referring physician: [name], MD  HISTORY OF PRESENT ILLNESS:  Christopher Rocha is an 81 y.o. male with a history of iron deficiency anemia, which previously improved after he received IV iron.  However, more importantly, this gentleman was recently found to have a right lower lobe lesion that had low-grade hypermetabolic activity per a recent PET scan.  He is scheduled to undergo another bronchoscopy to biopsy the area in question.  He comes in today for routine follow-up.  Since his last visit, the patient has been doing fairly well.  He denies having increased fatigue or any overt forms of blood loss which concern him for progressive anemia.  He also denies having any new respiratory symptoms which concern him for a new bronchogenic carcinoma being present.  With respect to his lung cancer history, he previously had a right upper lobe lung mass that was biopsy proven in September 2020 to be adenocarcinoma.  Staging ultimately showed him to have stage IIA (T2b N0 M0) disease.  The patient completed stereotactic radiation to this lesion in November 2020.    VITALS:  Blood pressure (!) 189/81, pulse 60, temperature 97.8 F (36.6 C), resp. rate 20, height 5\' 11"  (1.803 m), weight 190 lb 4.8 oz (86.3 kg), SpO2 93 %.  Wt Readings from Last 3 Encounters:  01/12/22 190 lb 4.8 oz (86.3 kg)  12/29/21 191 lb (86.6 kg)  12/16/21 191 lb 1.9 oz (86.7 kg)    Body mass index is 26.54 kg/m.  Performance status (ECOG): 0 - Asymptomatic  PHYSICAL EXAM:  Physical Exam Constitutional:      General: He is not in acute distress.    Appearance: Normal appearance. He is normal weight.  HENT:     Head: Normocephalic and atraumatic.  Eyes:     General: No scleral icterus.    Extraocular Movements: Extraocular movements intact.     Conjunctiva/sclera: Conjunctivae  normal.     Pupils: Pupils are equal, round, and reactive to light.  Cardiovascular:     Rate and Rhythm: Normal rate and regular rhythm.     Pulses: Normal pulses.     Heart sounds: Normal heart sounds. No murmur heard.   No friction rub. No gallop.  Pulmonary:     Effort: Pulmonary effort is normal. No respiratory distress.     Breath sounds: Normal breath sounds.  Abdominal:     General: Bowel sounds are normal. There is no distension.     Palpations: Abdomen is soft. There is no hepatomegaly, splenomegaly or mass.     Tenderness: There is no abdominal tenderness.  Musculoskeletal:        General: Normal range of motion.     Cervical back: Normal range of motion and neck supple.     Right lower leg: No edema.     Left lower leg: No edema.  Lymphadenopathy:     Cervical: No cervical adenopathy.  Skin:    General: Skin is warm and dry.  Neurological:     General: No focal deficit present.     Mental Status: He is alert and oriented to person, place, and time. Mental status is at baseline.  Psychiatric:        Mood and Affect: Mood normal.        Behavior: Behavior normal.        Thought Content: Thought  content normal.        Judgment: Judgment normal.    LABS:      Latest Ref Rng & Units 01/12/2022   12:00 AM 07/15/2021   12:00 AM 01/12/2021   12:00 AM  CBC  WBC  6.6      14.3   9.8    Hemoglobin 13.5 - 17.5 12.9      13.3   12.8    Hematocrit 41 - 53 39      39   39    Platelets 150 - 400 K/uL 135      238   177       This result is from an external source.      Latest Ref Rng & Units 01/12/2021   12:00 AM 10/22/2020    3:34 PM 08/19/2020    3:59 PM  CMP  Glucose 65 - 99 mg/dL   486    BUN 4 - 21 21    27     Creatinine 0.6 - 1.3 0.9   0.90   1.16    Sodium 137 - 147 137    125    Potassium 3.4 - 5.3 4.1    5.2    Chloride 99 - 108 101    93    CO2 13 - 22 31    24     Calcium 8.7 - 10.7 9.5    9.9    Alkaline Phos 25 - 125 96      AST 14 - 40 21      ALT  10 - 40 16        Latest Reference Range & Units 01/12/22 10:42  Iron 45 - 182 ug/dL 41 (L)  UIBC ug/dL 281  TIBC 250 - 450 ug/dL 322  Saturation Ratios 17.9 - 39.5 % 13 (L)  Ferritin 24 - 336 ng/mL 101  (L): Data is abnormally low  ASSESSMENT & PLAN:  A 81 year old gentleman with stage IIA lung adenocarcinoma, status post stereotactic radiation in November 2020.  He also has a history of iron deficiency anemia.  Overall, I am pleased as his hemoglobin has generally held stable without any recent particular intervention.  His iron parameters today are more consistent with anemia of chronic disease more so than iron deficiency anemia.  This will continue to be followed over time.  With respect to his history of lung cancer and his newly found right lower lobe lung lesion, the patient is scheduled to undergo a bronchoscopy with pulmonology in the forthcoming days.  He ultimately may be looking at stereotactic radiation again with right lower lobe lung lesion as it appears that he does not have locally advanced disease.  I will stay in contact with radiation oncology concerning his disease management.  Otherwise, I will see this gentleman back in 4 months for repeat clinical assessment. The patient understands all the plans discussed today and is in agreement with them.  Elbia Paro Macarthur Critchley, MD

## 2022-01-12 ENCOUNTER — Inpatient Hospital Stay: Payer: Medicare Other | Attending: Oncology | Admitting: Oncology

## 2022-01-12 ENCOUNTER — Other Ambulatory Visit: Payer: Self-pay

## 2022-01-12 ENCOUNTER — Inpatient Hospital Stay: Payer: Medicare Other

## 2022-01-12 ENCOUNTER — Other Ambulatory Visit: Payer: Self-pay | Admitting: Oncology

## 2022-01-12 VITALS — BP 189/81 | HR 60 | Temp 97.8°F | Resp 20 | Ht 71.0 in | Wt 190.3 lb

## 2022-01-12 DIAGNOSIS — D509 Iron deficiency anemia, unspecified: Secondary | ICD-10-CM | POA: Insufficient documentation

## 2022-01-12 DIAGNOSIS — D508 Other iron deficiency anemias: Secondary | ICD-10-CM

## 2022-01-12 DIAGNOSIS — C3491 Malignant neoplasm of unspecified part of right bronchus or lung: Secondary | ICD-10-CM

## 2022-01-12 LAB — CBC AND DIFFERENTIAL
HCT: 39 — AB (ref 41–53)
Hemoglobin: 12.9 — AB (ref 13.5–17.5)
Neutrophils Absolute: 3.1
Platelets: 135 10*3/uL — AB (ref 150–400)
WBC: 6.6

## 2022-01-12 LAB — IRON AND TIBC
Iron: 41 ug/dL — ABNORMAL LOW (ref 45–182)
Saturation Ratios: 13 % — ABNORMAL LOW (ref 17.9–39.5)
TIBC: 322 ug/dL (ref 250–450)
UIBC: 281 ug/dL

## 2022-01-12 LAB — CBC: RBC: 4.35 (ref 3.87–5.11)

## 2022-01-12 LAB — FERRITIN: Ferritin: 101 ng/mL (ref 24–336)

## 2022-02-06 ENCOUNTER — Telehealth: Payer: Self-pay | Admitting: Emergency Medicine

## 2022-02-07 NOTE — Telephone Encounter (Signed)
Patient was originally scheduled in [place] for bronch but had to postpone. He states that he never received a call to reschedule this procedure.   Can we try to get him rescheduled for this procedure.   Thank you

## 2022-02-09 ENCOUNTER — Other Ambulatory Visit: Payer: Self-pay

## 2022-02-09 DIAGNOSIS — R911 Solitary pulmonary nodule: Secondary | ICD-10-CM

## 2022-02-09 NOTE — Telephone Encounter (Signed)
I could schedule him for either 6/12 or 6/19 at [facility] He will need to hold ASA 2 days prior He needs a repeat super D Ct chest prior to the date.  Thank you for setting it up.

## 2022-02-09 NOTE — Telephone Encounter (Signed)
Pt has been scheduled for 6/12 at 12:45.  I verified with Larene Beach he can get a Super D Monday prior to the procedure.  I spoke to pt to give him appt info and made him aware he will get CT and covid test Monday prior to his procedure.

## 2022-02-09 NOTE — Telephone Encounter (Signed)
Dr Lamonte Sakai - when would you like to reschedule the patient.

## 2022-02-09 NOTE — Telephone Encounter (Signed)
Thanks

## 2022-02-10 ENCOUNTER — Other Ambulatory Visit: Payer: Self-pay

## 2022-02-10 ENCOUNTER — Encounter (HOSPITAL_COMMUNITY): Payer: Self-pay | Admitting: Emergency Medicine

## 2022-02-10 NOTE — Progress Notes (Signed)
PCP - [name], MD Cardiologist - Revankar, Reita Cliche, MD EKG - 12/16/21  Chest x-ray -  ECHO - 12/27/21 Cardiac Cath - 05/08/18 CPAP -   Aspirin Instructions: Follow your surgeon's instructions on when to stop Aspirin.  If no instructions were given by your surgeon then you will need to call the office to get those instructions.    ERAS Protcol -  COVID TEST- DOS  Anesthesia review: yes  -------------  SDW INSTRUCTIONS:  Your procedure is scheduled on 6/12. Please report to [facility] Main Entrance "A" at [place].M., and check in at the Admitting office. Call this number if you have problems the morning of surgery: [phone]   Remember: Do not eat or drink after midnight the night before your surgery  Medications to take morning of surgery with a sip of water include: acetaminophen (TYLENOL) rosuvastatin (CRESTOR)  As of today, STOP taking any Aleve, Naproxen, Ibuprofen, Motrin, Advil, Goody's, BC's, all herbal medications, fish oil, and all vitamins. Follow your surgeon's instructions on when to stop Aspirin.  If no instructions were given by your surgeon then you will need to call the office to get those instructions.       The Morning of Surgery Do not wear jewelry Do not wear lotions, powders, colognes, or deodorant Do not bring valuables to the hospital. [facility] is not responsible for any belongings or valuables.  If you are a smoker, DO NOT Smoke 24 hours prior to surgery  If you wear a CPAP at night please bring your mask the morning of surgery   Remember that you must have someone to transport you home after your surgery, and remain with you for 24 hours if you are discharged the same day.  Please bring cases for contacts, glasses, hearing aids, dentures or bridgework because it cannot be worn into surgery.   Patients discharged the day of surgery will not be allowed to drive home.   Please shower the NIGHT BEFORE/MORNING OF SURGERY (use antibacterial  soap like DIAL soap if possible). Wear comfortable clothes the morning of surgery. Oral Hygiene is also important to reduce your risk of infection.  Remember - BRUSH YOUR TEETH THE MORNING OF SURGERY WITH YOUR REGULAR TOOTHPASTE  Patient denies shortness of breath, fever, cough and chest pain.

## 2022-02-13 ENCOUNTER — Ambulatory Visit (HOSPITAL_COMMUNITY): Payer: Medicare Other

## 2022-02-13 ENCOUNTER — Encounter (HOSPITAL_COMMUNITY)
Admission: RE | Disposition: A | Discharge status: Home / Self Care | Payer: Self-pay | Source: Home / Self Care | Attending: Emergency Medicine

## 2022-02-13 ENCOUNTER — Ambulatory Visit (HOSPITAL_COMMUNITY)
Admission: RE | Admit: 2022-02-13 | Discharge: 2022-02-13 | Disposition: A | Discharge status: Home / Self Care | Payer: Medicare Other | Attending: Emergency Medicine | Admitting: Emergency Medicine

## 2022-02-13 ENCOUNTER — Ambulatory Visit (HOSPITAL_COMMUNITY): Payer: Medicare Other | Admitting: Physician Assistant

## 2022-02-13 ENCOUNTER — Encounter (HOSPITAL_COMMUNITY): Payer: Self-pay | Admitting: Emergency Medicine

## 2022-02-13 ENCOUNTER — Other Ambulatory Visit: Payer: Self-pay

## 2022-02-13 ENCOUNTER — Ambulatory Visit (HOSPITAL_BASED_OUTPATIENT_CLINIC_OR_DEPARTMENT_OTHER): Payer: Medicare Other | Admitting: Physician Assistant

## 2022-02-13 DIAGNOSIS — Z85118 Personal history of other malignant neoplasm of bronchus and lung: Secondary | ICD-10-CM | POA: Diagnosis not present

## 2022-02-13 DIAGNOSIS — Z87891 Personal history of nicotine dependence: Secondary | ICD-10-CM | POA: Diagnosis not present

## 2022-02-13 DIAGNOSIS — I5032 Chronic diastolic (congestive) heart failure: Secondary | ICD-10-CM | POA: Diagnosis not present

## 2022-02-13 DIAGNOSIS — I4891 Unspecified atrial fibrillation: Secondary | ICD-10-CM | POA: Diagnosis not present

## 2022-02-13 DIAGNOSIS — Z951 Presence of aortocoronary bypass graft: Secondary | ICD-10-CM | POA: Diagnosis not present

## 2022-02-13 DIAGNOSIS — I2510 Atherosclerotic heart disease of native coronary artery without angina pectoris: Secondary | ICD-10-CM | POA: Insufficient documentation

## 2022-02-13 DIAGNOSIS — J449 Chronic obstructive pulmonary disease, unspecified: Secondary | ICD-10-CM

## 2022-02-13 DIAGNOSIS — R911 Solitary pulmonary nodule: Secondary | ICD-10-CM

## 2022-02-13 DIAGNOSIS — I10 Essential (primary) hypertension: Secondary | ICD-10-CM

## 2022-02-13 DIAGNOSIS — Z20822 Contact with and (suspected) exposure to covid-19: Secondary | ICD-10-CM | POA: Diagnosis not present

## 2022-02-13 DIAGNOSIS — Z923 Personal history of irradiation: Secondary | ICD-10-CM | POA: Diagnosis not present

## 2022-02-13 DIAGNOSIS — I110 Hypertensive heart disease with heart failure: Secondary | ICD-10-CM | POA: Diagnosis not present

## 2022-02-13 DIAGNOSIS — Z952 Presence of prosthetic heart valve: Secondary | ICD-10-CM | POA: Diagnosis not present

## 2022-02-13 DIAGNOSIS — R918 Other nonspecific abnormal finding of lung field: Secondary | ICD-10-CM | POA: Diagnosis not present

## 2022-02-13 DIAGNOSIS — Z01818 Encounter for other preprocedural examination: Secondary | ICD-10-CM

## 2022-02-13 DIAGNOSIS — I517 Cardiomegaly: Secondary | ICD-10-CM | POA: Diagnosis not present

## 2022-02-13 DIAGNOSIS — I700 Atherosclerosis of aorta: Secondary | ICD-10-CM | POA: Diagnosis not present

## 2022-02-13 DIAGNOSIS — R846 Abnormal cytological findings in specimens from respiratory organs and thorax: Secondary | ICD-10-CM | POA: Diagnosis not present

## 2022-02-13 DIAGNOSIS — Z79899 Other long term (current) drug therapy: Secondary | ICD-10-CM | POA: Diagnosis not present

## 2022-02-13 HISTORY — PX: BRONCHIAL BRUSHINGS: SHX5108

## 2022-02-13 HISTORY — PX: BRONCHIAL WASHINGS: SHX5105

## 2022-02-13 HISTORY — PX: FIDUCIAL MARKER PLACEMENT: SHX6858

## 2022-02-13 HISTORY — PX: BRONCHIAL NEEDLE ASPIRATION BIOPSY: SHX5106

## 2022-02-13 HISTORY — PX: BRONCHIAL BIOPSY: SHX5109

## 2022-02-13 LAB — CBC
HCT: 41.1 % (ref 39.0–52.0)
Hemoglobin: 13.4 g/dL (ref 13.0–17.0)
MCH: 29.7 pg (ref 26.0–34.0)
MCHC: 32.6 g/dL (ref 30.0–36.0)
MCV: 91.1 fL (ref 80.0–100.0)
Platelets: 207 10*3/uL (ref 150–400)
RBC: 4.51 MIL/uL (ref 4.22–5.81)
RDW: 13.8 % (ref 11.5–15.5)
WBC: 11.9 10*3/uL — ABNORMAL HIGH (ref 4.0–10.5)
nRBC: 0 % (ref 0.0–0.2)

## 2022-02-13 LAB — BASIC METABOLIC PANEL
Anion gap: 11 (ref 5–15)
BUN: 17 mg/dL (ref 8–23)
CO2: 21 mmol/L — ABNORMAL LOW (ref 22–32)
Calcium: 9.1 mg/dL (ref 8.9–10.3)
Chloride: 101 mmol/L (ref 98–111)
Creatinine, Ser: 1.07 mg/dL (ref 0.61–1.24)
GFR, Estimated: >60 mL/min (ref >60)
Glucose, Bld: 140 mg/dL — ABNORMAL HIGH (ref 70–99)
Potassium: 4.7 mmol/L (ref 3.5–5.1)
Sodium: 133 mmol/L — ABNORMAL LOW (ref 135–145)

## 2022-02-13 LAB — SARS CORONAVIRUS 2 BY RT PCR: SARS Coronavirus 2 by RT PCR: NEGATIVE

## 2022-02-13 SURGERY — BRONCHOSCOPY, WITH BIOPSY USING ELECTROMAGNETIC NAVIGATION
Anesthesia: General

## 2022-02-13 MED ORDER — DEXAMETHASONE SODIUM PHOSPHATE 10 MG/ML IJ SOLN
INTRAMUSCULAR | Status: DC | PRN
  Administered 2022-02-13: 4 mg via INTRAVENOUS

## 2022-02-13 MED ORDER — PHENYLEPHRINE HCL-NACL 20-0.9 MG/250ML-% IV SOLN
INTRAVENOUS | Status: DC | PRN
  Administered 2022-02-13: 40 ug/min via INTRAVENOUS

## 2022-02-13 MED ORDER — SUGAMMADEX SODIUM 200 MG/2ML IV SOLN
INTRAVENOUS | Status: DC | PRN
  Administered 2022-02-13: 200 mg via INTRAVENOUS

## 2022-02-13 MED ORDER — ROCURONIUM BROMIDE 10 MG/ML (PF) SYRINGE
PREFILLED_SYRINGE | INTRAVENOUS | Status: DC | PRN
  Administered 2022-02-13: 60 mg via INTRAVENOUS
  Administered 2022-02-13: 10 mg via INTRAVENOUS

## 2022-02-13 MED ORDER — PHENYLEPHRINE 80 MCG/ML (10ML) SYRINGE FOR IV PUSH (FOR BLOOD PRESSURE SUPPORT)
PREFILLED_SYRINGE | INTRAVENOUS | Status: DC | PRN
  Administered 2022-02-13: 80 ug via INTRAVENOUS

## 2022-02-13 MED ORDER — PROPOFOL 10 MG/ML IV BOLUS
INTRAVENOUS | Status: DC | PRN
  Administered 2022-02-13: 100 mg via INTRAVENOUS

## 2022-02-13 MED ORDER — ONDANSETRON HCL 4 MG/2ML IJ SOLN
INTRAMUSCULAR | Status: DC | PRN
  Administered 2022-02-13: 4 mg via INTRAVENOUS

## 2022-02-13 MED ORDER — CHLORHEXIDINE GLUCONATE 0.12 % MT SOLN
15.0000 mL | Freq: Once | OROMUCOSAL | Status: AC
  Administered 2022-02-13: 15 mL via OROMUCOSAL
  Filled 2022-02-13 (×2): qty 15

## 2022-02-13 MED ORDER — LACTATED RINGERS IV SOLN: INTRAVENOUS | Status: DC

## 2022-02-13 MED ORDER — FENTANYL CITRATE (PF) 250 MCG/5ML IJ SOLN
INTRAMUSCULAR | Status: DC | PRN
  Administered 2022-02-13 (×2): 50 ug via INTRAVENOUS

## 2022-02-13 MED ORDER — LIDOCAINE 2% (20 MG/ML) 5 ML SYRINGE
INTRAMUSCULAR | Status: DC | PRN
  Administered 2022-02-13: 60 mg via INTRAVENOUS

## 2022-02-13 SURGICAL SUPPLY — 1 items: fiducial marker ×1 IMPLANT

## 2022-02-13 NOTE — Transfer of Care (Signed)
Immediate Anesthesia Transfer of Care Note  Patient: Christopher Rocha  Procedure(s) Performed: ROBOTIC ASSISTED NAVIGATIONAL BRONCHOSCOPY BRONCHIAL NEEDLE ASPIRATION BIOPSIES BRONCHIAL BRUSHINGS BRONCHIAL BIOPSIES FIDUCIAL MARKER PLACEMENT BRONCHIAL WASHINGS  Patient Location: Endoscopy Unit  Anesthesia Type:General  Level of Consciousness: awake, alert  and oriented  Airway & Oxygen Therapy: Patient Spontanous Breathing and Patient connected to nasal cannula oxygen  Post-op Assessment: Report given to RN and Post -op Vital signs reviewed and stable  Post vital signs: Reviewed and stable  Last Vitals:  Vitals Value Taken Time  BP 152/46 02/13/22 1302  Temp 36.7 C 02/13/22 1302  Pulse 117 02/13/22 1303  Resp 17 02/13/22 1305  SpO2 94 % 02/13/22 1303  Vitals shown include unvalidated device data.  Last Pain:  Vitals:   02/13/22 1302  TempSrc: Temporal  PainSc: 0-No pain         Complications: No notable events documented.

## 2022-02-13 NOTE — H&P (Signed)
Christopher Rocha is an 81 y.o. male.   Chief Complaint: Right lower lobe pulmonary nodule HPI:  81 year old man with history of tobacco use and associated COPD, CAD/CABG, hypertension with diastolic dysfunction, atrial fibrillation, severe AS for which he underwent TAVR.  He was treated for stage IIa right upper lobe adenocarcinoma by radiation oncology. He has a slowly enlarging mildly hypermetabolic mixed density right lower lobe pulmonary nodule.  He presents today for bronchoscopy, biopsies and fiducial marker placement to facilitate SBRT.  He has been seen and evaluated by cardiology.  He is no longer on anticoagulation.  COVID-19 test today is negative. Complains of chronic bronchitic symptoms, some thick white mucus production.  Breathing is at his current baseline  Past Medical History:  Diagnosis Date   Acute on chronic diastolic heart failure ([place]) 07/02/2018   Adenocarcinoma, lung, right ([place]) 10/27/2019   Anemia    iron deficiency    CAD (coronary artery disease) 03/25/2018   COPD (chronic obstructive pulmonary disease) ([place]) 06/14/2018   05/2018 FEV1 60%, DLCO 53%   Coronary artery disease    Essential hypertension 03/25/2018   History of radiation therapy 06/10/2019   right lung 06/03/2019-06/10/2019  Dr Gery Pray   HLD (hyperlipidemia)    Hx of CABG 03/25/2018   Iron deficiency anemia 01/12/2021   Mixed dyslipidemia 03/25/2018   Pulmonary nodule    S/P CABG (coronary artery bypass graft)    S/P TAVR (transcatheter aortic valve replacement)    Edwards Sapien XT THV (size 29 mm, model # 9300TFX, serial # [id number])   Severe aortic stenosis    Unspecified atrial fibrillation ([place]) 08/19/2020    Past Surgical History:  Procedure Laterality Date   BONE MARROW ASPIRATION     CARDIAC CATHETERIZATION     CARDIAC SURGERY     COLONOSCOPY     CORONARY ARTERY BYPASS GRAFT     EYE SURGERY     bilateral cataracts   FIBEROPTIC BRONCHOSCOPY  04/08/2019       RIGHT/LEFT HEART  CATH AND CORONARY/GRAFT ANGIOGRAPHY N/A 05/08/2018   Procedure: RIGHT/LEFT HEART CATH AND CORONARY/GRAFT ANGIOGRAPHY;  Surgeon: Wellington Hampshire, MD;  Location: [facility];  Service: Cardiovascular;  Laterality: N/A;   TEE WITHOUT CARDIOVERSION  07/02/2018   Procedure: TRANSESOPHAGEAL ECHOCARDIOGRAM (TEE);  Surgeon: Sherren Mocha, MD;  Location: [facility];  Service: Open Heart Surgery;;   TRANSCATHETER AORTIC VALVE REPLACEMENT, TRANSAPICAL N/A 07/02/2018   Procedure: TRANSCATHETER AORTIC VALVE REPLACEMENT, TRANSAPICAL;  Surgeon: Sherren Mocha, MD;  Location: [facility];  Service: Open Heart Surgery;  Laterality: N/A;   VIDEO BRONCHOSCOPY Bilateral 05/31/2018   Procedure: VIDEO BRONCHOSCOPY WITH FLUORO;  Surgeon: Rigoberto Noel, MD;  Location: [facility];  Service: Cardiopulmonary;  Laterality: Bilateral;   VIDEO BRONCHOSCOPY WITH ENDOBRONCHIAL NAVIGATION N/A 04/08/2019   Procedure: VIDEO BRONCHOSCOPY WITH ENDOBRONCHIAL NAVIGATION;  Surgeon: Rigoberto Noel, MD;  Location: [facility];  Service: Thoracic;  Laterality: N/A;   VIDEO BRONCHOSCOPY WITH ENDOBRONCHIAL ULTRASOUND N/A 04/08/2019   Procedure: Video Bronchoscopy With Endobronchial Ultrasound;  Surgeon: Rigoberto Noel, MD;  Location: [facility] OR;  Service: Thoracic;  Laterality: N/A;   WISDOM TOOTH EXTRACTION      Family History  Problem Relation Age of Onset   Diabetes Mother    Clotting disorder Father    Social History:  reports that he quit smoking about 34 years ago. His smoking use included cigarettes. He smoked an average of 2 packs per day. He has never used smokeless tobacco. He  reports that he does not currently use alcohol. He reports that he does not use drugs.  Allergies:  Allergies  Allergen Reactions   Penicillins Hives and Rash    Did it involve swelling of the face/tongue/throat, SOB, or low BP? No Did it involve sudden or severe rash/hives, skin peeling, or any reaction on the inside of your mouth or nose? No Did you need to seek  medical attention at a hospital or doctor's office? No When did it last happen?      10-15 years If all above answers are "NO", may proceed with cephalosporin use.      Medications Prior to Admission  Medication Sig Dispense Refill   acetaminophen (TYLENOL) 500 MG tablet Take 500 mg by mouth every 8 (eight) hours as needed (pain).     aspirin EC 81 MG tablet Take 81 mg by mouth every other day.      budesonide-formoterol (SYMBICORT) 160-4.5 MCG/ACT inhaler Inhale 2 puffs into the lungs 2 (two) times daily. 1 Inhaler 5   losartan-hydrochlorothiazide (HYZAAR) 100-12.5 MG tablet Take 1 tablet by mouth daily.     Naphazoline-Glycerin (REDNESS RELIEF OP) Place 1 drop into both eyes daily as needed (for redness/itching).     PROAIR HFA 108 (90 Base) MCG/ACT inhaler Inhale 2 puffs into the lungs every 4 (four) hours as needed for shortness of breath.     rosuvastatin (CRESTOR) 10 MG tablet Take 1 tablet by mouth once daily (Patient taking differently: Take 10 mg by mouth daily.) 90 tablet 2   triamcinolone (KENALOG) 0.1 % Apply 1 application. topically 3 (three) times daily as needed (itching).     ibuprofen (ADVIL) 200 MG tablet Take 200 mg by mouth every 6 (six) hours as needed for mild pain.      Results for orders placed or performed during the hospital encounter of 02/13/22 (from the past 48 hour(s))  SARS Coronavirus 2 by RT PCR (hospital order, performed in [facility] hospital lab) *cepheid single result test* Anterior Nasal Swab     Status: None   Collection Time: 02/13/22  9:44 AM   Specimen: Anterior Nasal Swab  Result Value Ref Range   SARS Coronavirus 2 by RT PCR NEGATIVE NEGATIVE    Comment: (NOTE) SARS-CoV-2 target nucleic acids are NOT DETECTED.  The SARS-CoV-2 RNA is generally detectable in upper and lower respiratory specimens during the acute phase of infection. The lowest concentration of SARS-CoV-2 viral copies this assay can detect is 250 copies / mL. A negative result  does not preclude SARS-CoV-2 infection and should not be used as the sole basis for treatment or other patient management decisions.  A negative result may occur with improper specimen collection / handling, submission of specimen other than nasopharyngeal swab, presence of viral mutation(s) within the areas targeted by this assay, and inadequate number of viral copies (<250 copies / mL). A negative result must be combined with clinical observations, patient history, and epidemiological information.  Fact Sheet for Patients:   https://www.patel.info/  Fact Sheet for Healthcare Providers: https://hall.com/  This test is not yet approved or  cleared by the [place] FDA and has been authorized for detection and/or diagnosis of SARS-CoV-2 by FDA under an Emergency Use Authorization (EUA).  This EUA will remain in effect (meaning this test can be used) for the duration of the COVID-19 declaration under Section 564(b)(1) of the Act, 21 U.S.C. section 360bbb-3(b)(1), unless the authorization is terminated or revoked sooner.  Performed at [facility]  Lab, [address], [place], [place] [address]   Basic metabolic panel per protocol     Status: Abnormal   Collection Time: 02/13/22 10:26 AM  Result Value Ref Range   Sodium 133 (L) 135 - 145 mmol/L   Potassium 4.7 3.5 - 5.1 mmol/L   Chloride 101 98 - 111 mmol/L   CO2 21 (L) 22 - 32 mmol/L   Glucose, Bld 140 (H) 70 - 99 mg/dL    Comment: Glucose reference range applies only to samples taken after fasting for at least 8 hours.   BUN 17 8 - 23 mg/dL   Creatinine, Ser 1.07 0.61 - 1.24 mg/dL   Calcium 9.1 8.9 - 10.3 mg/dL   GFR, Estimated >60 >60 mL/min    Comment: (NOTE) Calculated using the CKD-EPI Creatinine Equation (2021)    Anion gap 11 5 - 15    Comment: Performed at [facility] [address] [place], [place] [address]  CBC per protocol     Status: Abnormal   Collection  Time: 02/13/22 10:26 AM  Result Value Ref Range   WBC 11.9 (H) 4.0 - 10.5 K/uL   RBC 4.51 4.22 - 5.81 MIL/uL   Hemoglobin 13.4 13.0 - 17.0 g/dL   HCT 41.1 39.0 - 52.0 %   MCV 91.1 80.0 - 100.0 fL   MCH 29.7 26.0 - 34.0 pg   MCHC 32.6 30.0 - 36.0 g/dL   RDW 13.8 11.5 - 15.5 %   Platelets 207 150 - 400 K/uL   nRBC 0.0 0.0 - 0.2 %    Comment: Performed at [facility], [place] [address] [place], [place] [address]   No results found.  Review of Systems  Blood pressure (!) 153/86, pulse 91, temperature (!) 97.5 F (36.4 C), resp. rate 17, height 5\' 11"  (1.803 m), weight 90.7 kg, SpO2 97 %. Physical Exam  Gen: Pleasant, well-nourished, in no distress,  normal affect  ENT: No lesions,  mouth clear,  oropharynx clear, no postnasal drip, M1 airway  Neck: No JVD, no stridor  Lungs: No use of accessory muscles, coarse bilaterally with scattered rhonchi, evidence of secretions.  No wheeze.  No wheeze on forced expiration  Cardiovascular: RRR, heart sounds normal, no murmur or gallops, no peripheral edema  Abdomen: soft and NT, no HSM,  BS normal  Musculoskeletal: No deformities, no cyanosis or clubbing  Neuro: alert, awake, non focal  Skin: Warm, no lesions or rashes    Assessment/Plan Mixed density right lower lobe pulmonary nodule in a patient with a history of right upper lobe lung cancer that was treated with SBRT.  High suspicion for recurrent primary lung cancer.  Plan is for navigational bronchoscopy with biopsies and fiducial marker placement.  Patient understands rationale, risk, benefits and elects to proceed.  No barriers identified.  [name], MD 02/13/2022, 11:30 AM

## 2022-02-13 NOTE — Anesthesia Postprocedure Evaluation (Signed)
Anesthesia Post Note  Patient: Trong Gosling [facility]  Procedure(s) Performed: ROBOTIC ASSISTED NAVIGATIONAL BRONCHOSCOPY BRONCHIAL NEEDLE ASPIRATION BIOPSIES BRONCHIAL BRUSHINGS BRONCHIAL BIOPSIES FIDUCIAL MARKER PLACEMENT BRONCHIAL WASHINGS     Patient location during evaluation: PACU Anesthesia Type: General Level of consciousness: sedated Pain management: pain level controlled Vital Signs Assessment: post-procedure vital signs reviewed and stable Respiratory status: spontaneous breathing and respiratory function stable Cardiovascular status: stable Postop Assessment: no apparent nausea or vomiting Anesthetic complications: no   No notable events documented.  Last Vitals:  Vitals:   02/13/22 1322 02/13/22 1332  BP: 138/66 (!) 131/52  Pulse: 77 (!) 40  Resp: (!) 22 14  Temp:    SpO2: 95% 92%    Last Pain:  Vitals:   02/13/22 1332  TempSrc:   PainSc: 0-No pain                 Nanea Jared DANIEL

## 2022-02-13 NOTE — Discharge Instructions (Signed)
Flexible Bronchoscopy, Care After This sheet gives you information about how to care for yourself after your test. Your doctor may also give you more specific instructions. If you have problems or questions, contact your doctor. Follow these instructions at home: Eating and drinking Do not eat or drink anything (not even water) for 2 hours after your test, or until your numbing medicine (local anesthetic) wears off. When your numbness is gone and your cough and gag reflexes have come back, you may: Eat only soft foods. Slowly drink liquids. The day after the test, go back to your normal diet. Driving Do not drive for 24 hours if you were given a medicine to help you relax (sedative). Do not drive or use heavy machinery while taking prescription pain medicine. General instructions  Take over-the-counter and prescription medicines only as told by your doctor. Return to your normal activities as told. Ask what activities are safe for you. Do not use any products that have nicotine or tobacco in them. This includes cigarettes and e-cigarettes. If you need help quitting, ask your doctor. Keep all follow-up visits as told by your doctor. This is important. It is very important if you had a tissue sample (biopsy) taken. Get help right away if: You have shortness of breath that gets worse. You get light-headed. You feel like you are going to pass out (faint). You have chest pain. You cough up: More than a little blood. More blood than before. Summary Do not eat or drink anything (not even water) for 2 hours after your test, or until your numbing medicine wears off. Do not use cigarettes. Do not use e-cigarettes. Get help right away if you have chest pain.  Please call our office for any questions or concerns.  [phone].   This information is not intended to replace advice given to you by your health care provider. Make sure you discuss any questions you have with your health care  provider. Document Released: 06/18/2009 Document Revised: 08/03/2017 Document Reviewed: 09/08/2016 Elsevier Patient Education  2020 [employer].

## 2022-02-13 NOTE — Anesthesia Procedure Notes (Signed)
Procedure Name: Intubation Date/Time: 02/13/2022 11:55 AM  Performed by: Anastasio Auerbach, CRNAPre-anesthesia Checklist: Patient identified, Emergency Drugs available, Suction available and Patient being monitored Patient Re-evaluated:Patient Re-evaluated prior to induction Oxygen Delivery Method: Circle system utilized Preoxygenation: Pre-oxygenation with 100% oxygen Induction Type: IV induction Ventilation: Mask ventilation without difficulty Laryngoscope Size: Mac and 3 Grade View: Grade I Tube type: Oral Tube size: 8.5 mm Number of attempts: 1 Airway Equipment and Method: Stylet and Oral airway Placement Confirmation: ETT inserted through vocal cords under direct vision, positive ETCO2 and breath sounds checked- equal and bilateral Secured at: 24 cm Tube secured with: Tape Dental Injury: Teeth and Oropharynx as per pre-operative assessment

## 2022-02-13 NOTE — Op Note (Signed)
Video Bronchoscopy with Robotic Assisted Bronchoscopic Navigation   Date of Operation: 02/13/2022   Pre-op Diagnosis: Right lower lobe pulmonary nodule  Post-op Diagnosis: Same  Surgeon: Baltazar Apo  Assistants: None  Anesthesia: General endotracheal anesthesia  Operation: Flexible video fiberoptic bronchoscopy with robotic assistance and biopsies.  Estimated Blood Loss: Minimal  Complications: None  Indications and History: Christopher Rocha is a 81 y.o. male with history of tobacco use, COPD, CAD and prior adenocarcinoma of the lung.  Found to have a new mixed density right lower lobe pulmonary nodule suspicious for primary lung cancer.  Recommendation made to achieve tissue diagnosis via robotic assisted navigational bronchoscopy. The risks, benefits, complications, treatment options and expected outcomes were discussed with the patient.  The possibilities of pneumothorax, pneumonia, reaction to medication, pulmonary aspiration, perforation of a viscus, bleeding, failure to diagnose a condition and creating a complication requiring transfusion or operation were discussed with the patient who freely signed the consent.    Description of Procedure: The patient was seen in the Preoperative Area, was examined and was deemed appropriate to proceed.  The patient was taken to [facility] endoscopy room 3, identified as Christopher Rocha and the procedure verified as Flexible Video Fiberoptic Bronchoscopy.  A Time Out was held and the above information confirmed.   Prior to the date of the procedure a high-resolution CT scan of the chest was performed. Utilizing ION software program a virtual tracheobronchial tree was generated to allow the creation of distinct navigation pathways to the patient's parenchymal abnormalities. After being taken to the operating room general anesthesia was initiated and the patient  was orally intubated. The video fiberoptic bronchoscope was introduced via the  endotracheal tube and a general inspection was performed which showed normal right and left lung anatomy. Aspiration of the bilateral mainstems was completed to remove any remaining secretions. Robotic catheter inserted into patient's endotracheal tube.   Target #1 right lower lobe pulmonary nodule: The distinct navigation pathways prepared prior to this procedure were then utilized to navigate to patient's lesion identified on CT scan. The robotic catheter was secured into place and the vision probe was withdrawn.  Lesion location was approximated using fluoroscopy and radial endobronchial ultrasound for peripheral targeting.  Local registration and targeting was performed using [employer]ios three-dimensional imaging.  Under fluoroscopic guidance transbronchial needle brushings, transbronchial needle biopsies, and transbronchial forceps biopsies were performed to be sent for cytology and pathology.  Under fluoroscopic guidance a single fiducial marker was placed adjacent to the nodule.  A bronchioalveolar lavage was performed in the right lower lobe adjacent to the nodule and sent for cytology.   At the end of the procedure a general airway inspection was performed and there was no evidence of active bleeding. The bronchoscope was removed.  The patient tolerated the procedure well. There was no significant blood loss and there were no obvious complications. A post-procedural chest x-ray is pending.  Samples Target #1: 1. Transbronchial needle brushings from right lower lobe nodule 2. Transbronchial Wang needle biopsies from right lower lobe nodule 3. Transbronchial forceps biopsies from right lower lobe nodule 4. Bronchoalveolar lavage from right lower lobe  Plans:  The patient will be discharged from the PACU to home when recovered from anesthesia and after chest x-ray is reviewed. We will review the cytology, pathology and microbiology results with the patient when they become available. Outpatient  followup will be with Dr. Lamonte Sakai.    Baltazar Apo, MD, PhD 02/13/2022, 12:54 PM [facility] [phone] or if no answer before 7:00PM call [phone] For any issues after 7:00PM please call eLink [phone]

## 2022-02-13 NOTE — Anesthesia Preprocedure Evaluation (Addendum)
Anesthesia Evaluation  Patient identified by MRN, date of birth, ID band Patient awake    Reviewed: Allergy & Precautions, NPO status , Patient's Chart, lab work & pertinent test results, reviewed documented beta blocker date and time   History of Anesthesia Complications Negative for: history of anesthetic complications  Airway Mallampati: II  TM Distance: >3 FB Neck ROM: Full    Dental  (+) Dental Advisory Given, Edentulous Lower, Edentulous Upper   Pulmonary COPD,  COPD inhaler, former smoker,    Pulmonary exam normal        Cardiovascular hypertension, Pt. on medications and Pt. on home beta blockers + CAD and + CABG  Normal cardiovascular exam+ Valvular Problems/Murmurs   S/P TAVR  IMPRESSIONS    1. Frequent PVC's. Left ventricular ejection fraction, by estimation, is  50 to 55%. The left ventricle has low normal function. Left ventricular  endocardial border not optimally defined to evaluate regional wall motion.  There is mild concentric left  ventricular hypertrophy. Left ventricular diastolic parameters are  indeterminate.  2. Right ventricular systolic function is mildly reduced. The right  ventricular size is normal.  3. The mitral valve is degenerative. No evidence of mitral valve  regurgitation. No evidence of mitral stenosis.  4. The aortic valve was not well visualized. Aortic valve regurgitation  is not visualized. Mild aortic valve stenosis.    Neuro/Psych negative neurological ROS  negative psych ROS   GI/Hepatic negative GI ROS,   Endo/Other  negative endocrine ROS  Renal/GU negative Renal ROS     Musculoskeletal   Abdominal   Peds  Hematology negative hematology ROS (+)   Anesthesia Other Findings S/P TAVR (transcatheter aortic valve replacement)  - Procedure narrative: Transthoracic echocardiography. Image   quality was suboptimal. The study was technically difficult.    Intravenous contrast (Definity) was administered to opacify the   LV. - Left ventricle: The cavity size was normal. Wall thickness was   increased in a pattern of moderate LVH. Systolic function was   normal. The estimated ejection fraction was in the range of 60%   to 65%. Wall motion was normal; there were no regional wall   motion abnormalities. - Aortic valve: A bioprosthesis was present. Mean gradient (S): 13   mm Hg. Peak gradient (S): 27 mm Hg. - Mitral valve: There was mild regurgitation. - Left atrium: The atrium was mildly dilated.   Reproductive/Obstetrics                            Anesthesia Physical  Anesthesia Plan  ASA: 3  Anesthesia Plan: General   Post-op Pain Management: Minimal or no pain anticipated, Celebrex PO (pre-op)* and Tylenol PO (pre-op)*   Induction: Intravenous  PONV Risk Score and Plan: 2 and Ondansetron and Dexamethasone  Airway Management Planned: Oral ETT  Additional Equipment: None  Intra-op Plan:   Post-operative Plan: Extubation in OR  Informed Consent: I have reviewed the patients History and Physical, chart, labs and discussed the procedure including the risks, benefits and alternatives for the proposed anesthesia with the patient or authorized representative who has indicated his/her understanding and acceptance.     Dental advisory given  Plan Discussed with: Anesthesiologist, CRNA and Surgeon  Anesthesia Plan Comments:        Anesthesia Quick Evaluation

## 2022-02-14 LAB — CYTOLOGY - NON PAP

## 2022-02-15 ENCOUNTER — Encounter (HOSPITAL_COMMUNITY): Payer: Self-pay | Admitting: Emergency Medicine

## 2022-02-15 ENCOUNTER — Telehealth: Payer: Self-pay | Admitting: Emergency Medicine

## 2022-02-15 DIAGNOSIS — C3491 Malignant neoplasm of unspecified part of right bronchus or lung: Secondary | ICD-10-CM

## 2022-02-15 NOTE — Telephone Encounter (Signed)
Reviewed navigational bronchoscopy results with the patient by phone today.  Transbronchial brushings and biopsies are most consistent with adenocarcinoma.  I will refer him back to radiation oncology.  His next follow-up with [name] is in September.

## 2022-02-17 NOTE — Progress Notes (Signed)
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 Yes - 06/03/2019 - 06/10/2019 Rt Lung 54 Gy Pacemaker/ICD? {:18581}  Possible current pregnancy?no Is the patient on methotrexate? {:18581}  Current Complaints / other details:  ***

## 2022-02-18 NOTE — Progress Notes (Signed)
Radiation Oncology         (336) [phone] ________________________________  Outpatient Re-Consultation  Name: Christopher Rocha MRN: [id number]  Date: 02/20/2022  DOB: [birth year]  WU:JWJX, Gara Kroner, MD  [name], MD   REFERRING PHYSICIAN: [name], MD  DIAGNOSIS: There were no encounter diagnoses.  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 male who is accompanied by ***. he returns today for re-evaluation and further discussion of radiation therapy in management of a suspicious RLL nodule. I last met with the patient on 11/24/21 for routine follow up. To review from out last visit, we reviewed his PET scan performed on 11/21/21 which showed findings consistent with the low-grade adenocarcinoma of a RLL pulmonary nodule.  We discussed consideration for biopsy or proceeding with SBRT treatments without tissue diagnosis, and I informed the patient that it would be beneficial to have a fiducial marker placed in the area of concern to aid in radiation planning and treatment. Following much discussion, the patient agreed to proceed with consideration for bronchoscopy and biopsies.   Accordingly, the patient was referred to Dr. Lamonte Sakai and proceeded to undergo bronchoscopy with RLL biopsies on 02/13/22. Pathology from FNA of the RLL revealed clusters of atypical cells suspicious of low grade non-mucinous adenocarcinoma.   Pertinent imaging thus far includes a super D chest CT on 02/13/22 which showed slow progressive enlargement of an aggressive appearing mixed solid and sub solid lesion in the right lower  lobe, remaining highly concerning for primary bronchogenic adenocarcinoma. CT also showed an increasing peribronchovascular ground-glass attenuation micro nodularity in the left lower lobe, with differentials including infectious/ inflammatory etiologies vs metastatic disease. No definite lymphadenopathy was appreciated.  Of note: the patient followed up with [name] on 01/12/22 to discuss plans of proceeding with bronchoscopy and further radiation. The patient again expressed agreement to this plan. In terms of his history of iron deficiency anemia, his labs were reviewed which showed his hemoglobin as generally stable without any recent particular intervention. His iron parameters were also reviewed and were noted as most consistent with anemia of a chronic disease more so than iron deficiency anemia.     PREVIOUS RADIATION THERAPY: No  PAST MEDICAL HISTORY:  Past Medical History:  Diagnosis Date   Acute on chronic diastolic heart failure ([place]) 07/02/2018   Adenocarcinoma, lung, right ([place]) 10/27/2019   Anemia    iron deficiency    CAD (coronary artery disease) 03/25/2018   COPD (chronic obstructive pulmonary disease) ([place]) 06/14/2018   05/2018 FEV1 60%, DLCO 53%   Coronary artery disease    Essential hypertension 03/25/2018   History of radiation therapy 06/10/2019   right lung 06/03/2019-06/10/2019  Dr Gery Pray   HLD (hyperlipidemia)    Hx of CABG 03/25/2018   Iron deficiency anemia 01/12/2021   Mixed dyslipidemia 03/25/2018   Pulmonary nodule    S/P CABG (coronary artery bypass graft)    S/P TAVR (transcatheter aortic valve replacement)    Edwards Sapien XT THV (size 29 mm, model # 9300TFX, serial # [id number])   Severe aortic stenosis    Unspecified  atrial fibrillation ([place]) 08/19/2020    PAST SURGICAL HISTORY: Past Surgical History:  Procedure Laterality Date   BONE MARROW ASPIRATION     BRONCHIAL BIOPSY  02/13/2022   Procedure: BRONCHIAL BIOPSIES;  Surgeon: [name], MD;  Location: [facility] ENDOSCOPY;  Service: Pulmonary;;   BRONCHIAL BRUSHINGS  02/13/2022   Procedure: BRONCHIAL BRUSHINGS;  Surgeon: [name], MD;  Location: [facility] ENDOSCOPY;  Service: Pulmonary;;   BRONCHIAL NEEDLE ASPIRATION BIOPSY  02/13/2022   Procedure: BRONCHIAL NEEDLE ASPIRATION BIOPSIES;  Surgeon: [name], MD;  Location: [facility] ENDOSCOPY;  Service: Pulmonary;;   BRONCHIAL WASHINGS  02/13/2022   Procedure: BRONCHIAL WASHINGS;  Surgeon: [name], MD;  Location: [facility] ENDOSCOPY;  Service: Pulmonary;;   CARDIAC CATHETERIZATION     CARDIAC SURGERY     COLONOSCOPY     CORONARY ARTERY BYPASS GRAFT     EYE SURGERY     bilateral cataracts   FIBEROPTIC BRONCHOSCOPY  04/08/2019       FIDUCIAL MARKER PLACEMENT  02/13/2022   Procedure: FIDUCIAL MARKER PLACEMENT;  Surgeon: [name], MD;  Location: [facility] ENDOSCOPY;  Service: Pulmonary;;   RIGHT/LEFT HEART CATH AND CORONARY/GRAFT ANGIOGRAPHY N/A 05/08/2018   Procedure: RIGHT/LEFT HEART CATH AND CORONARY/GRAFT ANGIOGRAPHY;  Surgeon: Wellington Hampshire, MD;  Location: [place] CV LAB;  Service: Cardiovascular;  Laterality: N/A;   TEE WITHOUT CARDIOVERSION  07/02/2018   Procedure: TRANSESOPHAGEAL ECHOCARDIOGRAM (TEE);  Surgeon: Sherren Mocha, MD;  Location: [place];  Service: Open Heart Surgery;;   TRANSCATHETER AORTIC VALVE REPLACEMENT, TRANSAPICAL N/A 07/02/2018   Procedure: TRANSCATHETER AORTIC VALVE REPLACEMENT, TRANSAPICAL;  Surgeon: Sherren Mocha, MD;  Location: [place];  Service: Open Heart Surgery;  Laterality: N/A;   VIDEO BRONCHOSCOPY Bilateral 05/31/2018   Procedure: VIDEO BRONCHOSCOPY WITH FLUORO;  Surgeon: Rigoberto Noel, MD;  Location: [facility] ENDOSCOPY;  Service: Cardiopulmonary;  Laterality: Bilateral;   VIDEO BRONCHOSCOPY WITH ENDOBRONCHIAL NAVIGATION N/A 04/08/2019   Procedure: VIDEO BRONCHOSCOPY WITH ENDOBRONCHIAL NAVIGATION;  Surgeon: Rigoberto Noel, MD;  Location: [facility] OR;  Service: Thoracic;  Laterality: N/A;   VIDEO  BRONCHOSCOPY WITH ENDOBRONCHIAL ULTRASOUND N/A 04/08/2019   Procedure: Video Bronchoscopy With Endobronchial Ultrasound;  Surgeon: Rigoberto Noel, MD;  Location: [facility] OR;  Service: Thoracic;  Laterality: N/A;   WISDOM TOOTH EXTRACTION      FAMILY HISTORY:  Family History  Problem Relation Age of Onset   Diabetes Mother    Clotting disorder Father     SOCIAL HISTORY:  Social History   Tobacco Use   Smoking status: Former    Packs/day: 2.00    Types: Cigarettes    Quit date: 02/16/1988    Years since quitting: 34.0   Smokeless tobacco: Never  Vaping Use   Vaping Use: Never used  Substance Use Topics   Alcohol use: Not Currently   Drug use: Never    ALLERGIES:  Allergies  Allergen Reactions   Penicillins Hives and Rash    Did it involve swelling of the face/tongue/throat, SOB, or low BP? No Did it involve sudden or severe rash/hives, skin peeling, or any reaction on the inside of your mouth or nose? No Did you need to seek medical attention at a hospital or doctor's office? No When did it last happen?      10-15 years If all above answers are "NO", may proceed with cephalosporin use.      MEDICATIONS:  Current Outpatient Medications  Medication Sig Dispense Refill   acetaminophen (TYLENOL) 500 MG  tablet Take 500 mg by mouth every 8 (eight) hours as needed (pain).     aspirin EC 81 MG tablet Take 81 mg by mouth every other day.      budesonide-formoterol (SYMBICORT) 160-4.5 MCG/ACT inhaler Inhale 2 puffs into the lungs 2 (two) times daily. 1 Inhaler 5   ibuprofen (ADVIL) 200 MG tablet Take 200 mg by mouth every 6 (six) hours as needed for mild pain.     losartan-hydrochlorothiazide (HYZAAR) 100-12.5 MG tablet Take 1 tablet by mouth daily.     Naphazoline-Glycerin (REDNESS RELIEF OP) Place 1 drop into both eyes daily as needed (for redness/itching).     PROAIR HFA 108 (90 Base) MCG/ACT inhaler Inhale 2 puffs into the lungs every 4 (four) hours as needed for shortness of  breath.     rosuvastatin (CRESTOR) 10 MG tablet Take 1 tablet by mouth once daily (Patient taking differently: Take 10 mg by mouth daily.) 90 tablet 2   triamcinolone (KENALOG) 0.1 % Apply 1 application. topically 3 (three) times daily as needed (itching).     No current facility-administered medications for this encounter.    REVIEW OF SYSTEMS:  A 10+ POINT REVIEW OF SYSTEMS WAS OBTAINED including neurology, dermatology, psychiatry, cardiac, respiratory, lymph, extremities, GI, GU, musculoskeletal, constitutional, reproductive, HEENT. ***   PHYSICAL EXAM:  vitals were not taken for this visit.   General: Alert and oriented, in no acute distress HEENT: Head is normocephalic. Extraocular movements are intact. Oropharynx is clear. Neck: Neck is supple, no palpable cervical or supraclavicular lymphadenopathy. Heart: Regular in rate and rhythm with no murmurs, rubs, or gallops. Chest: Clear to auscultation bilaterally, with no rhonchi, wheezes, or rales. Abdomen: Soft, nontender, nondistended, with no rigidity or guarding. Extremities: No cyanosis or edema. Lymphatics: see Neck Exam Skin: No concerning lesions. Musculoskeletal: symmetric strength and muscle tone throughout. Neurologic: Cranial nerves II through XII are grossly intact. No obvious focalities. Speech is fluent. Coordination is intact. Psychiatric: Judgment and insight are intact. Affect is appropriate. ***  ECOG = ***  0 - Asymptomatic (Fully active, able to carry on all predisease activities without restriction)  1 - Symptomatic but completely ambulatory (Restricted in physically strenuous activity but ambulatory and able to carry out work of a light or sedentary nature. For example, light housework, office work)  2 - Symptomatic, <50% in bed during the day (Ambulatory and capable of all self care but unable to carry out any work activities. Up and about more than 50% of waking hours)  3 - Symptomatic, >50% in bed, but not  bedbound (Capable of only limited self-care, confined to bed or chair 50% or more of waking hours)  4 - Bedbound (Completely disabled. Cannot carry on any self-care. Totally confined to bed or chair)  5 - Death   Eustace Pen MM, Creech RH, Tormey DC, et al. [phone]). "Toxicity and response criteria of the [facility]". [place] 5 (6): 649-55  LABORATORY DATA:  Lab Results  Component Value Date   WBC 11.9 (H) 02/13/2022   HGB 13.4 02/13/2022   HCT 41.1 02/13/2022   MCV 91.1 02/13/2022   PLT 207 02/13/2022   NEUTROABS 3.10 01/12/2022   Lab Results  Component Value Date   NA 133 (L) 02/13/2022   K 4.7 02/13/2022   CL 101 02/13/2022   CO2 21 (L) 02/13/2022   GLUCOSE 140 (H) 02/13/2022   BUN 17 02/13/2022   CREATININE 1.07 02/13/2022   CALCIUM 9.1 02/13/2022  RADIOGRAPHY: CT Super D Chest Wo Contrast  Result Date: 02/14/2022 CLINICAL DATA:  81 year old male history of non-small cell carcinoma. * Tracking Code: BO * EXAM: CT CHEST WITHOUT CONTRAST TECHNIQUE: Multidetector CT imaging of the chest was performed using thin slice collimation for electromagnetic bronchoscopy planning purposes, without intravenous contrast. RADIATION DOSE REDUCTION: This exam was performed according to the departmental dose-optimization program which includes automated exposure control, adjustment of the mA and/or kV according to patient size and/or use of iterative reconstruction technique. COMPARISON:  Multiple priors, most recently PET-CT 11/21/2021. FINDINGS: Cardiovascular: Heart size is normal. There is no significant pericardial fluid, thickening or pericardial calcification. There is aortic atherosclerosis, as well as atherosclerosis of the great vessels of the mediastinum and the coronary arteries, including calcified atherosclerotic plaque in the left main, left anterior descending, left circumflex and right coronary arteries. Status post median sternotomy for CABG  including LIMA to the LAD. Status post TAVR. Severe calcifications of the mitral annulus. Mediastinum/Nodes: No pathologically enlarged mediastinal or hilar lymph nodes. Please note that accurate exclusion of hilar adenopathy is limited on noncontrast CT scans. Small to moderate-sized hiatal hernia. No axillary lymphadenopathy. Lungs/Pleura: Chronic mass-like area of architectural distortion in the posterior aspect of the right upper lobe with associated chronic volume loss, and similar findings to a lesser extent in the posterior aspect of the superior segment of the right lower lobe, stable compared to prior examinations, previously not hypermetabolic on PET-CT, most compatible with areas of chronic postradiation mass-like fibrosis. Previously noted mixed solid and sub solid nodular area of concern in the right lower lobe (axial image 120 of series 4 and coronal image 119 of series 6) continues to enlarge, currently measuring 3.3 x 1.8 x 2.3 cm with more solid-appearing component measuring up to 1.3 cm (axial image 117 of series 4), some internal air bronchograms, macrolobulated and slightly spiculated margins, now extending to the overlying pleura. Patchy areas of peribronchovascular ground-glass attenuation and micro nodularity are noted in the left lower lobe increasingly apparent compared to the prior study. No pleural effusions. Upper Abdomen: Aortic atherosclerosis. Musculoskeletal: Median sternotomy wires. Multiple old healing fractures are noted of the right third, fourth and fifth ribs. There are no aggressive appearing lytic or blastic lesions noted in the visualized portions of the skeleton. IMPRESSION: 1. Slow progressive enlargement of an aggressive appearing mixed solid and sub solid lesion in the right lower lobe which remains highly concerning for primary bronchogenic adenocarcinoma. 2. Increasing peribronchovascular ground-glass attenuation micro nodularity in the left lower lobe. This could be  infectious or inflammatory in etiology, however, the possibility of so-called "aerogenous metastasis" is not excluded. 3. No definite lymphadenopathy noted on today's noncontrast CT examination. 4. Stable postradiation mass-like fibrosis in the superior segment of the right lower lobe and right upper lobe again noted. 5. Aortic atherosclerosis, in addition to left main and three-vessel coronary artery disease. 6. There are calcifications of the mitral annulus. Echocardiographic correlation for evaluation of potential valvular dysfunction may be warranted if clinically indicated. 7. Status post TAVR. Aortic Atherosclerosis (ICD10-I70.0). Electronically Signed   By: Vinnie Langton M.D.   On: 02/14/2022 09:48   DG Chest Port 1 View  Result Date: 02/13/2022 CLINICAL DATA:  Status post bronchoscopy and biopsy EXAM: PORTABLE CHEST 1 VIEW COMPARISON:  01/12/2020, CT chest, 02/13/2022 FINDINGS: Cardiomegaly status post median sternotomy. Biopsy marking clip within a nodular opacity of the right lung base. Dense consolidation of the right apex, unchanged. No pneumothorax. IMPRESSION: Biopsy marking clip within  a nodular opacity of the right lung base. Dense consolidation of the right apex, unchanged. No pneumothorax. Electronically Signed   By: Delanna Ahmadi M.D.   On: 02/13/2022 13:46   DG C-ARM BRONCHOSCOPY  Result Date: 02/13/2022 C-ARM BRONCHOSCOPY: Fluoroscopy was utilized by the requesting physician.  No radiographic interpretation.      IMPRESSION: RLL nodule suspicious for low grade adenocarcinoma   ***  Today, I talked to the patient and family about the findings and work-up thus far.  We discussed the natural history of *** and general treatment, highlighting the role of radiotherapy in the management.  We discussed the available radiation techniques, and focused on the details of logistics and delivery.  We reviewed the anticipated acute and late sequelae associated with radiation in this setting.   The patient was encouraged to ask questions that I answered to the best of my ability. *** A patient consent form was discussed and signed.  We retained a copy for our records.  The patient would like to proceed with radiation and will be scheduled for CT simulation.  PLAN: ***    *** minutes of total time was spent for this patient encounter, including preparation, face-to-face counseling with the patient and coordination of care, physical exam, and documentation of the encounter.   ------------------------------------------------  Blair Promise, PhD, MD  This document serves as a record of services personally performed by Gery Pray, MD. It was created on his behalf by Roney Mans, a trained medical scribe. The creation of this record is based on the scribe's personal observations and the provider's statements to them. This document has been checked and approved by the attending provider.

## 2022-02-20 ENCOUNTER — Ambulatory Visit
Admission: RE | Admit: 2022-02-20 | Discharge: 2022-02-20 | Disposition: A | Discharge status: Home / Self Care | Payer: Medicare Other | Source: Ambulatory Visit | Attending: Radiation Oncology | Admitting: Radiation Oncology

## 2022-02-20 ENCOUNTER — Encounter: Payer: Self-pay | Admitting: Radiation Oncology

## 2022-02-20 ENCOUNTER — Other Ambulatory Visit: Payer: Self-pay

## 2022-02-20 VITALS — BP 186/82 | HR 52 | Temp 97.1°F | Resp 18 | Ht 71.0 in | Wt 185.0 lb

## 2022-02-20 DIAGNOSIS — C3491 Malignant neoplasm of unspecified part of right bronchus or lung: Secondary | ICD-10-CM

## 2022-02-20 DIAGNOSIS — C3411 Malignant neoplasm of upper lobe, right bronchus or lung: Secondary | ICD-10-CM | POA: Insufficient documentation

## 2022-02-20 DIAGNOSIS — I110 Hypertensive heart disease with heart failure: Secondary | ICD-10-CM | POA: Insufficient documentation

## 2022-02-20 DIAGNOSIS — Z7951 Long term (current) use of inhaled steroids: Secondary | ICD-10-CM | POA: Diagnosis not present

## 2022-02-20 DIAGNOSIS — I2510 Atherosclerotic heart disease of native coronary artery without angina pectoris: Secondary | ICD-10-CM | POA: Insufficient documentation

## 2022-02-20 DIAGNOSIS — K449 Diaphragmatic hernia without obstruction or gangrene: Secondary | ICD-10-CM | POA: Insufficient documentation

## 2022-02-20 DIAGNOSIS — I3481 Nonrheumatic mitral (valve) annulus calcification: Secondary | ICD-10-CM | POA: Insufficient documentation

## 2022-02-20 DIAGNOSIS — Z79899 Other long term (current) drug therapy: Secondary | ICD-10-CM | POA: Diagnosis not present

## 2022-02-20 DIAGNOSIS — I350 Nonrheumatic aortic (valve) stenosis: Secondary | ICD-10-CM | POA: Diagnosis not present

## 2022-02-20 DIAGNOSIS — I700 Atherosclerosis of aorta: Secondary | ICD-10-CM | POA: Insufficient documentation

## 2022-02-20 DIAGNOSIS — R911 Solitary pulmonary nodule: Secondary | ICD-10-CM

## 2022-02-20 DIAGNOSIS — I5032 Chronic diastolic (congestive) heart failure: Secondary | ICD-10-CM | POA: Insufficient documentation

## 2022-02-20 DIAGNOSIS — D509 Iron deficiency anemia, unspecified: Secondary | ICD-10-CM | POA: Diagnosis not present

## 2022-02-20 DIAGNOSIS — Z87891 Personal history of nicotine dependence: Secondary | ICD-10-CM | POA: Insufficient documentation

## 2022-02-20 DIAGNOSIS — J449 Chronic obstructive pulmonary disease, unspecified: Secondary | ICD-10-CM | POA: Insufficient documentation

## 2022-02-20 DIAGNOSIS — E782 Mixed hyperlipidemia: Secondary | ICD-10-CM | POA: Insufficient documentation

## 2022-02-20 DIAGNOSIS — Z951 Presence of aortocoronary bypass graft: Secondary | ICD-10-CM | POA: Diagnosis not present

## 2022-02-22 ENCOUNTER — Ambulatory Visit
Admission: RE | Admit: 2022-02-22 | Discharge: 2022-02-22 | Disposition: A | Discharge status: Home / Self Care | Payer: Medicare Other | Source: Ambulatory Visit | Attending: Radiation Oncology | Admitting: Radiation Oncology

## 2022-02-22 ENCOUNTER — Other Ambulatory Visit: Payer: Self-pay

## 2022-02-22 DIAGNOSIS — C3411 Malignant neoplasm of upper lobe, right bronchus or lung: Secondary | ICD-10-CM | POA: Diagnosis not present

## 2022-02-22 DIAGNOSIS — Z510 Encounter for antineoplastic radiation therapy: Secondary | ICD-10-CM | POA: Insufficient documentation

## 2022-02-22 DIAGNOSIS — R918 Other nonspecific abnormal finding of lung field: Secondary | ICD-10-CM | POA: Diagnosis not present

## 2022-02-22 DIAGNOSIS — C3491 Malignant neoplasm of unspecified part of right bronchus or lung: Secondary | ICD-10-CM

## 2022-02-23 ENCOUNTER — Other Ambulatory Visit: Payer: Self-pay | Admitting: *Deleted

## 2022-02-23 NOTE — Progress Notes (Signed)
The proposed treatment discussed in cancer conference is for discussion purpose only and is not a binding recommendation. The patient was not physically examined nor present for their treatment options. Therefore, final treatment plans cannot be decided.  ?

## 2022-03-01 DIAGNOSIS — Z510 Encounter for antineoplastic radiation therapy: Secondary | ICD-10-CM | POA: Diagnosis not present

## 2022-03-01 DIAGNOSIS — R918 Other nonspecific abnormal finding of lung field: Secondary | ICD-10-CM | POA: Diagnosis not present

## 2022-03-01 DIAGNOSIS — C3411 Malignant neoplasm of upper lobe, right bronchus or lung: Secondary | ICD-10-CM | POA: Diagnosis not present

## 2022-03-02 DIAGNOSIS — R918 Other nonspecific abnormal finding of lung field: Secondary | ICD-10-CM | POA: Diagnosis not present

## 2022-03-02 DIAGNOSIS — C3411 Malignant neoplasm of upper lobe, right bronchus or lung: Secondary | ICD-10-CM | POA: Diagnosis not present

## 2022-03-02 DIAGNOSIS — Z510 Encounter for antineoplastic radiation therapy: Secondary | ICD-10-CM | POA: Diagnosis not present

## 2022-03-09 ENCOUNTER — Other Ambulatory Visit: Payer: Self-pay

## 2022-03-09 ENCOUNTER — Ambulatory Visit
Admission: RE | Admit: 2022-03-09 | Discharge: 2022-03-09 | Disposition: A | Discharge status: Home / Self Care | Payer: Medicare Other | Source: Ambulatory Visit | Attending: Radiation Oncology | Admitting: Radiation Oncology

## 2022-03-09 DIAGNOSIS — Z510 Encounter for antineoplastic radiation therapy: Secondary | ICD-10-CM | POA: Diagnosis not present

## 2022-03-09 DIAGNOSIS — R918 Other nonspecific abnormal finding of lung field: Secondary | ICD-10-CM | POA: Diagnosis not present

## 2022-03-09 LAB — RAD ONC ARIA SESSION SUMMARY
Course Elapsed Days: 0
Plan Fractions Treated to Date: 1
Plan Prescribed Dose Per Fraction: 18 Gy
Plan Total Fractions Prescribed: 3
Plan Total Prescribed Dose: 54 Gy
Reference Point Dosage Given to Date: 18 Gy
Reference Point Session Dosage Given: 18 Gy
Session Number: 1

## 2022-03-10 ENCOUNTER — Ambulatory Visit: Payer: Medicare Other

## 2022-03-13 ENCOUNTER — Ambulatory Visit: Payer: Medicare Other

## 2022-03-14 ENCOUNTER — Ambulatory Visit: Payer: Medicare Other

## 2022-03-14 ENCOUNTER — Other Ambulatory Visit: Payer: Self-pay

## 2022-03-14 ENCOUNTER — Ambulatory Visit
Admission: RE | Admit: 2022-03-14 | Discharge: 2022-03-14 | Disposition: A | Discharge status: Home / Self Care | Payer: Medicare Other | Source: Ambulatory Visit | Attending: Radiation Oncology | Admitting: Radiation Oncology

## 2022-03-14 DIAGNOSIS — Z510 Encounter for antineoplastic radiation therapy: Secondary | ICD-10-CM | POA: Diagnosis not present

## 2022-03-14 DIAGNOSIS — R918 Other nonspecific abnormal finding of lung field: Secondary | ICD-10-CM | POA: Diagnosis not present

## 2022-03-14 LAB — RAD ONC ARIA SESSION SUMMARY
Course Elapsed Days: 5
Plan Fractions Treated to Date: 2
Plan Prescribed Dose Per Fraction: 18 Gy
Plan Total Fractions Prescribed: 3
Plan Total Prescribed Dose: 54 Gy
Reference Point Dosage Given to Date: 36 Gy
Reference Point Session Dosage Given: 18 Gy
Session Number: 2

## 2022-03-16 ENCOUNTER — Other Ambulatory Visit: Payer: Self-pay

## 2022-03-16 ENCOUNTER — Ambulatory Visit: Payer: Medicare Other

## 2022-03-16 ENCOUNTER — Ambulatory Visit
Admission: RE | Admit: 2022-03-16 | Discharge: 2022-03-16 | Disposition: A | Discharge status: Home / Self Care | Payer: Medicare Other | Source: Ambulatory Visit | Attending: Radiation Oncology | Admitting: Radiation Oncology

## 2022-03-16 ENCOUNTER — Encounter: Payer: Self-pay | Admitting: Radiation Oncology

## 2022-03-16 DIAGNOSIS — C3411 Malignant neoplasm of upper lobe, right bronchus or lung: Secondary | ICD-10-CM | POA: Diagnosis not present

## 2022-03-16 DIAGNOSIS — R918 Other nonspecific abnormal finding of lung field: Secondary | ICD-10-CM | POA: Diagnosis not present

## 2022-03-16 DIAGNOSIS — Z510 Encounter for antineoplastic radiation therapy: Secondary | ICD-10-CM | POA: Diagnosis not present

## 2022-03-16 LAB — RAD ONC ARIA SESSION SUMMARY
Course Elapsed Days: 7
Plan Fractions Treated to Date: 3
Plan Prescribed Dose Per Fraction: 18 Gy
Plan Total Fractions Prescribed: 3
Plan Total Prescribed Dose: 54 Gy
Reference Point Dosage Given to Date: 54 Gy
Reference Point Session Dosage Given: 18 Gy
Session Number: 3

## 2022-03-29 DIAGNOSIS — Z79899 Other long term (current) drug therapy: Secondary | ICD-10-CM | POA: Diagnosis not present

## 2022-03-29 DIAGNOSIS — J42 Unspecified chronic bronchitis: Secondary | ICD-10-CM | POA: Diagnosis not present

## 2022-03-29 DIAGNOSIS — I10 Essential (primary) hypertension: Secondary | ICD-10-CM | POA: Diagnosis not present

## 2022-03-29 DIAGNOSIS — Z0000 Encounter for general adult medical examination without abnormal findings: Secondary | ICD-10-CM | POA: Diagnosis not present

## 2022-03-29 DIAGNOSIS — R7309 Other abnormal glucose: Secondary | ICD-10-CM | POA: Diagnosis not present

## 2022-04-12 ENCOUNTER — Encounter: Payer: Self-pay | Admitting: Radiation Oncology

## 2022-04-15 NOTE — Progress Notes (Signed)
Radiation Oncology         [phone]) [phone] ________________________________  Name: Christopher Rocha MRN: [id number]  Date: 04/17/2022  DOB: Jul 25, 1941  Follow-Up Visit Note  CC: [name], MD  [name], MD  No diagnosis found.  Diagnosis:  The primary encounter diagnosis was Adenocarcinoma, lung, right ([place]). A diagnosis of Pulmonary nodule was also pertinent to this visit.   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 The patient tolerated radiation therapy relatively well. On the date of his final treatment, the patient denied any side effects from treatment. (Of note: patient was found to be hypotensive on the date of his final treatment because he forgot to take his BP medication that morning).     No significant interval history since the patient was last seen.   Of note: The patient's case was discussed at the tumor board held on 02/23/22.   ***                             Allergies:  is allergic to penicillins.  Meds: Current Outpatient Medications  Medication Sig Dispense Refill   acetaminophen (TYLENOL) 500 MG tablet Take 500 mg by mouth every 8 (eight) hours as needed (pain).     aspirin EC 81 MG tablet Take 81 mg by mouth every other day.      budesonide-formoterol (SYMBICORT) 160-4.5 MCG/ACT inhaler Inhale 2 puffs into the lungs 2 (two) times daily. 1 Inhaler 5   ibuprofen (ADVIL) 200 MG tablet Take 200 mg by mouth every 6 (six) hours as needed for mild pain.     losartan-hydrochlorothiazide (HYZAAR) 100-12.5 MG tablet Take 1 tablet by mouth daily.      Naphazoline-Glycerin (REDNESS RELIEF OP) Place 1 drop into both eyes daily as needed (for redness/itching).     PROAIR HFA 108 (90 Base) MCG/ACT inhaler Inhale 2 puffs into the lungs every 4 (four) hours as needed for shortness of breath.     rosuvastatin (CRESTOR) 10 MG tablet Take 1 tablet by mouth once daily (Patient taking differently: Take 10 mg by mouth daily.) 90 tablet 2   triamcinolone (KENALOG) 0.1 % Apply 1 application. topically 3 (three) times daily as needed (itching).     No current facility-administered medications for this encounter.    Physical Findings: The patient is in no acute distress. Patient is alert and oriented.  vitals were not taken for this visit. .  No significant changes. Lungs are clear to auscultation bilaterally. Heart has regular rate and rhythm. No palpable cervical, supraclavicular, or axillary adenopathy. Abdomen soft, non-tender, normal bowel sounds.   Lab Findings: Lab Results  Component Value Date   WBC 11.9 (H) 02/13/2022   HGB 13.4 02/13/2022   HCT 41.1 02/13/2022   MCV 91.1 02/13/2022   PLT 207 02/13/2022    Radiographic Findings: No results found.  Impression: The primary encounter diagnosis was Adenocarcinoma, lung, right ([place]). A diagnosis of Pulmonary nodule was also pertinent to this visit.   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  ***  Plan:  ***   *** minutes of total time was spent for this patient encounter, including preparation, face-to-face counseling with the patient and coordination of care, physical exam, and documentation of the encounter. ____________________________________  Blair Promise, PhD, MD  This document serves as a record of services personally performed by Gery Pray, MD. It was created on his behalf by Roney Mans, a trained medical scribe. The  creation of this record is based on the scribe's personal observations and the provider's statements to them. This document has been checked and approved by the attending provider.

## 2022-04-15 NOTE — Progress Notes (Incomplete)
  Radiation Oncology         (336) [phone] ________________________________  Patient Name: Christopher Rocha MRN: [id number] DOB: 1941-07-15 Referring Physician: Baltazar Apo (Profile Not Attached) Date of Service: 03/16/2022 [facility] [place]                                                        End Of Treatment Note  Diagnoses: C34.11-Malignant neoplasm of upper lobe, right bronchus or lung R91.8-Other nonspecific abnormal finding of lung field Z85.118-Personal history of other malignant neoplasm of bronchus and lung  Cancer Staging:  The primary encounter diagnosis was Adenocarcinoma, lung, right ([place]). A diagnosis of Pulmonary nodule was also pertinent to this visit.   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 On the date of his final treatment, the patient denied any side effects from treatment. (Of note: patient was found to be hypotensive on the date of his final treatment due to forgetting to take his BP medication that morning).    Plan: The patient will follow-up with radiation oncology in one month .  ________________________________________________ -----------------------------------  Blair Promise, PhD, MD  This document serves as a record of services personally performed by Gery Pray, MD. It was created on his behalf by Roney Mans, a trained medical scribe. The creation of this record is based on the scribe's personal observations and the provider's statements to them. This document has been checked and approved by the attending provider.

## 2022-04-17 ENCOUNTER — Other Ambulatory Visit: Payer: Self-pay

## 2022-04-17 ENCOUNTER — Ambulatory Visit
Admission: RE | Admit: 2022-04-17 | Discharge: 2022-04-17 | Disposition: A | Discharge status: Home / Self Care | Payer: Medicare Other | Source: Ambulatory Visit | Attending: Radiation Oncology | Admitting: Radiation Oncology

## 2022-04-17 DIAGNOSIS — Z79899 Other long term (current) drug therapy: Secondary | ICD-10-CM | POA: Diagnosis not present

## 2022-04-17 DIAGNOSIS — Z923 Personal history of irradiation: Secondary | ICD-10-CM | POA: Diagnosis not present

## 2022-04-17 DIAGNOSIS — C3411 Malignant neoplasm of upper lobe, right bronchus or lung: Secondary | ICD-10-CM | POA: Insufficient documentation

## 2022-04-17 DIAGNOSIS — Z7951 Long term (current) use of inhaled steroids: Secondary | ICD-10-CM | POA: Diagnosis not present

## 2022-04-17 DIAGNOSIS — C3491 Malignant neoplasm of unspecified part of right bronchus or lung: Secondary | ICD-10-CM

## 2022-04-17 DIAGNOSIS — R918 Other nonspecific abnormal finding of lung field: Secondary | ICD-10-CM | POA: Diagnosis not present

## 2022-04-17 NOTE — Progress Notes (Signed)
Christopher Rocha is here today for follow up post radiation to the lung.  Lung Side: right   Completed radiation treatment on: 03/16/2022  Does the patient complain of any of the following: Pain:no Shortness of breath w/wo exertion: yes especially with the heat. He reports it is the same as before radiation. Cough: occasional in the mornings. Hemoptysis: no Pain with swallowing: no Swallowing/choking concerns: no Appetite: good Energy Level: poor Post radiation skin Changes: no    Additional comments if applicable: none noted  BP (!) 177/71 (BP Location: Left Arm, Patient Position: Sitting, Cuff Size: Normal)   Pulse 69   Temp 98.2 F (36.8 C)   Resp 20   Ht 5\' 11"  (1.803 m)   Wt 188 lb (85.3 kg)   SpO2 99%   BMI 26.22 kg/m

## 2022-05-15 NOTE — Progress Notes (Signed)
Christopher Rocha  [address] [place],  [place]  [address] [phone]  Clinic Day:  05/15/2022  Referring physician: [name], MD  HISTORY OF PRESENT ILLNESS:  Christopher Rocha is an 81 y.o. male with a history of iron deficiency anemia, which previously improved after he received IV iron.  He comes in today to reassess his anemia.  Since his last visit, the patient's been doing okay.  He denies having increased fatigue or any overt forms of blood loss which concern him for recurrent iron deficiency anemia.  However, more importantly, this gentleman was recently found to have a right lower lobe lung adenocarcinoma for which he underwent  stereotactic radiation to this lesion in July 2023.  He previously had a right upper lobe lung mass that was biopsy proven in September 2020 to be adenocarcinoma.  Staging ultimately showed him to have stage IIA (T2b N0 M0) disease.  The patient completed stereotactic radiation to this lesion in November 2020.    VITALS:  Blood pressure (!) 207/86, pulse 70, temperature (!) 97.4 F (36.3 C), resp. rate 18, height 5\' 11"  (1.803 m), weight 189 lb 11.2 oz (86 kg), SpO2 96 %.  Wt Readings from Last 3 Encounters:  05/16/22 189 lb 11.2 oz (86 kg)  04/17/22 188 lb (85.3 kg)  02/20/22 185 lb (83.9 kg)    Body mass index is 26.46 kg/m.  Performance status (ECOG): 0 - Asymptomatic  PHYSICAL EXAM:  Physical Exam Constitutional:      General: He is not in acute distress.    Appearance: Normal appearance. He is normal weight.  HENT:     Head: Normocephalic and atraumatic.  Eyes:     General: No scleral icterus.    Extraocular Movements: Extraocular movements intact.     Conjunctiva/sclera: Conjunctivae normal.     Pupils: Pupils are equal, round, and reactive to light.  Cardiovascular:     Rate and Rhythm: Normal rate and regular rhythm.     Pulses: Normal pulses.     Heart sounds: Normal heart sounds. No murmur  heard.    No friction rub. No gallop.  Pulmonary:     Effort: Pulmonary effort is normal. No respiratory distress.     Breath sounds: Normal breath sounds.  Abdominal:     General: Bowel sounds are normal. There is no distension.     Palpations: Abdomen is soft. There is no hepatomegaly, splenomegaly or mass.     Tenderness: There is no abdominal tenderness.  Musculoskeletal:        General: Normal range of motion.     Cervical back: Normal range of motion and neck supple.     Right lower leg: No edema.     Left lower leg: No edema.  Lymphadenopathy:     Cervical: No cervical adenopathy.  Skin:    General: Skin is warm and dry.  Neurological:     General: No focal deficit present.     Mental Status: He is alert and oriented to person, place, and time. Mental status is at baseline.  Psychiatric:        Mood and Affect: Mood normal.        Behavior: Behavior normal.        Thought Content: Thought content normal.        Judgment: Judgment normal.     LABS:      Latest Ref Rng & Units 05/16/2022   12:00 AM 02/13/2022  10:26 AM 01/12/2022   12:00 AM  CBC  WBC  10.2     11.9  6.6      Hemoglobin 13.5 - 17.5 12.4     13.4  12.9      Hematocrit 41 - 53 37     41.1  39      Platelets 150 - 400 K/uL 172     207  135         This result is from an external source.      Latest Ref Rng & Units 02/13/2022   10:26 AM 01/12/2021   12:00 AM 10/22/2020    3:34 PM  CMP  Glucose 70 - 99 mg/dL 140     BUN 8 - 23 mg/dL 17  21    Creatinine 0.61 - 1.24 mg/dL 1.07  0.9  0.90   Sodium 135 - 145 mmol/L 133  137    Potassium 3.5 - 5.1 mmol/L 4.7  4.1    Chloride 98 - 111 mmol/L 101  101    CO2 22 - 32 mmol/L 21  31    Calcium 8.9 - 10.3 mg/dL 9.1  9.5    Alkaline Phos 25 - 125  96    AST 14 - 40  21    ALT 10 - 40  16      Latest Reference Range & Units 05/16/22 08:55  Iron 45 - 182 ug/dL 86  UIBC ug/dL 233  TIBC 250 - 450 ug/dL 319  Saturation Ratios 17.9 - 39.5 % 27  Ferritin 24  - 336 ng/mL 153    ASSESSMENT & PLAN:  A 81 year old gentleman with a history of iron deficiency anemia.  Although his hemoglobin is lower today than what it was at his last visit, his iron parameters remain normal.  Based upon this, his anemia will continue to be followed conservatively.  As mentioned previously, he was found to have a new right lower lobe lung adenocarcinoma, for which she underwent stereotactic radiation in July 2023.  He is currently being followed by radiation oncology, with the plan being to undergo a chest CT in the forthcoming months for continued disease surveillance.  As mentioned previously, he also has a history of stage IIA lung adenocarcinoma, status post stereotactic radiation in November 2020.  He also has a history of iron deficiency anemia.  Clinically, he appears to be doing well.  I will see this gentleman back in 4 months for repeat clinical assessment. The patient understands all the plans discussed today and is in agreement with them.  [name], MD

## 2022-05-16 ENCOUNTER — Telehealth: Payer: Self-pay | Admitting: Oncology

## 2022-05-16 ENCOUNTER — Inpatient Hospital Stay: Payer: Medicare Other

## 2022-05-16 ENCOUNTER — Inpatient Hospital Stay: Payer: Medicare Other | Attending: Oncology | Admitting: Oncology

## 2022-05-16 ENCOUNTER — Telehealth: Payer: Self-pay

## 2022-05-16 VITALS — BP 207/86 | HR 70 | Temp 97.4°F | Resp 18 | Ht 71.0 in | Wt 189.7 lb

## 2022-05-16 DIAGNOSIS — Z862 Personal history of diseases of the blood and blood-forming organs and certain disorders involving the immune mechanism: Secondary | ICD-10-CM | POA: Diagnosis not present

## 2022-05-16 DIAGNOSIS — Z85118 Personal history of other malignant neoplasm of bronchus and lung: Secondary | ICD-10-CM | POA: Diagnosis not present

## 2022-05-16 DIAGNOSIS — D508 Other iron deficiency anemias: Secondary | ICD-10-CM | POA: Diagnosis not present

## 2022-05-16 DIAGNOSIS — C3491 Malignant neoplasm of unspecified part of right bronchus or lung: Secondary | ICD-10-CM

## 2022-05-16 LAB — CBC: RBC: 4.09 (ref 3.87–5.11)

## 2022-05-16 LAB — IRON AND TIBC
Iron: 86 ug/dL (ref 45–182)
Saturation Ratios: 27 % (ref 17.9–39.5)
TIBC: 319 ug/dL (ref 250–450)
UIBC: 233 ug/dL

## 2022-05-16 LAB — CBC AND DIFFERENTIAL
HCT: 37 — AB (ref 41–53)
Hemoglobin: 12.4 — AB (ref 13.5–17.5)
Neutrophils Absolute: 6.73
Platelets: 172 10*3/uL (ref 150–400)
WBC: 10.2

## 2022-05-16 LAB — FERRITIN: Ferritin: 153 ng/mL (ref 24–336)

## 2022-05-16 NOTE — Telephone Encounter (Signed)
Pt notified of Dr [name]' assessment and plan. Pt verbalized understanding.    Latest Reference Range & Units 05/16/22 08:55  Iron 45 - 182 ug/dL 86  UIBC ug/dL 233  TIBC 250 - 450 ug/dL 319  Saturation Ratios 17.9 - 39.5 % 27  Ferritin 24 - 336 ng/mL 153      ASSESSMENT & PLAN:  A 81 year old gentleman with a history of iron deficiency anemia.  Although his hemoglobin is lower today than what it was at his last visit, his iron parameters remain normal.  Based upon this, his anemia will continue to be followed conservatively.  As mentioned previously, he was found to have a new right lower lobe lung adenocarcinoma, for which she underwent stereotactic radiation in July 2023.  He is currently being followed by radiation oncology, with the plan being to undergo a chest CT in the forthcoming months for continued disease surveillance.  As mentioned previously, he also has a history of stage IIA lung adenocarcinoma, status post stereotactic radiation in November 2020.  He also has a history of iron deficiency anemia.  Clinically, he appears to be doing well.  I will see this gentleman back in 4 months for repeat clinical assessment. The patient understands all the plans discussed today and is in agreement with them.   Marice Potter, MD          Electronically signed by Marice Potter, MD at 05/16/2022  1:33 PM

## 2022-05-16 NOTE — Telephone Encounter (Signed)
05/16/22 Next appt scheduled and confirmed with patient

## 2022-05-31 ENCOUNTER — Telehealth: Payer: Self-pay | Admitting: *Deleted

## 2022-05-31 NOTE — Telephone Encounter (Signed)
CALLED PATIENT TO INFORM OF CT FOR 07-18-22- ARRIVAL TIME- 12:30 PM @ [facility], NO RESTRICTIONS TO TEST, PATIENT TO RECEIVE RESULTS FROM DR. KINARD ON 07-20-22 @ 10:45 AM, SPOKE WITH PATIENT AND HE IS AWARE OF THESE APPTS. AND THE INSTRUCTIOINS

## 2022-05-31 NOTE — Telephone Encounter (Signed)
CALLED PATIENT TO INFORM OF CT FOR 07-18-22- ARRIVAL TIME- 12:45 PM @ [facility], NO RESTRICTIONS TO TEST, PATIENT TO RECEIVE RESULTS FROM DR. KINARD ON 07-20-22 @ 10:45 AM, LVM FOR A RETURN CALL

## 2022-07-18 ENCOUNTER — Ambulatory Visit (HOSPITAL_COMMUNITY)
Admission: RE | Admit: 2022-07-18 | Discharge: 2022-07-18 | Disposition: A | Discharge status: Home / Self Care | Payer: Medicare Other | Source: Ambulatory Visit | Attending: Radiation Oncology | Admitting: Radiation Oncology

## 2022-07-18 DIAGNOSIS — C3491 Malignant neoplasm of unspecified part of right bronchus or lung: Secondary | ICD-10-CM | POA: Insufficient documentation

## 2022-07-18 DIAGNOSIS — J90 Pleural effusion, not elsewhere classified: Secondary | ICD-10-CM | POA: Diagnosis not present

## 2022-07-18 DIAGNOSIS — C3490 Malignant neoplasm of unspecified part of unspecified bronchus or lung: Secondary | ICD-10-CM | POA: Diagnosis not present

## 2022-07-19 NOTE — Progress Notes (Signed)
Radiation Oncology         [phone] ________________________________  Name: Christopher Rocha MRN: [id number]  Date: 07/20/2022  DOB: 1940-10-26  Follow-Up Visit Note  CC: Christopher Hipps, MD  Christopher Gobble, MD  No diagnosis found.  Diagnosis:  The primary encounter diagnosis was Adenocarcinoma, lung, right ([place]). A diagnosis of Pulmonary nodule was also pertinent to this visit.   RLL nodule suspicious for low grade adenocarcinoma   Interval Since Last Radiation: 4 months and 3 days   Intent: Curative  Radiation Treatment Dates: 03/09/2022 through 03/16/2022 Site Technique Total Dose (Gy) Dose per Fx (Gy) Completed Fx Beam Energies  Lung, Right: Lung_R IMRT 54/54 18 3/3 6XFFF   Narrative:  The patient returns today for routine follow-up and to review recent imaging, he was last seen here for follow-up on 04/17/22. Since his last visit, the patient met with Dr. Bobby Rocha on 05/16/22 for follow up of his history of anemia. During which time, the patient denied any increase in fatigue or any overt forms of blood loss concerning for recurrent iron deficiency anemia. Labs reviewed during this visit showed lower Hg but his iron remained normal. The patient will continue to follow-up with Dr. Bobby Rocha every 4 months for repeat labs.   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     ***                           Allergies:  is allergic to  penicillins.  Meds: Current Outpatient Medications  Medication Sig Dispense Refill   acetaminophen (TYLENOL) 500 MG tablet Take 500 mg by mouth every 8 (eight) hours as needed (pain).     aspirin EC 81 MG tablet Take 81 mg by mouth every other day.      budesonide-formoterol (SYMBICORT) 160-4.5 MCG/ACT inhaler Inhale 2 puffs into the lungs 2 (two) times daily. 1 Inhaler 5   ibuprofen (ADVIL) 200 MG tablet Take 200 mg by mouth every 6 (six) hours as needed for mild pain.     losartan-hydrochlorothiazide (HYZAAR) 100-12.5 MG tablet Take 1 tablet by mouth daily.     Naphazoline-Glycerin (REDNESS RELIEF OP) Place 1 drop into both eyes daily as needed (for redness/itching).     PROAIR HFA 108 (90 Base) MCG/ACT inhaler Inhale 2 puffs into the lungs every 4 (four) hours as needed for shortness of breath.     rosuvastatin (CRESTOR) 10 MG tablet Take 1 tablet by mouth once daily (Patient taking differently: Take 10 mg by mouth daily.) 90 tablet 2   triamcinolone (KENALOG) 0.1 % Apply 1 application. topically 3 (three) times daily as needed (itching).     No current facility-administered medications for this encounter.    Physical Findings: The patient is in no acute distress. Patient is alert and oriented.  vitals were not taken for this visit. .  No significant changes. Lungs are clear to auscultation bilaterally. Heart has regular rate and rhythm. No palpable cervical, supraclavicular, or axillary  adenopathy. Abdomen soft, non-tender, normal bowel sounds.   Lab Findings: Lab Results  Component Value Date   WBC 10.2 05/16/2022   HGB 12.4 (A) 05/16/2022   HCT 37 (A) 05/16/2022   MCV 91.1 02/13/2022   PLT 172 05/16/2022    Radiographic Findings: CT CHEST WO CONTRAST  Result Date: 07/19/2022 CLINICAL DATA:  Restaging non-small cell lung cancer. EXAM: CT CHEST WITHOUT CONTRAST TECHNIQUE: Multidetector CT imaging of the chest was performed following the standard protocol without IV contrast.  RADIATION DOSE REDUCTION: This exam was performed according to the departmental dose-optimization program which includes automated exposure control, adjustment of the mA and/or kV according to patient size and/or use of iterative reconstruction technique. COMPARISON:  Multiple prior imaging studies. The most recent is a chest CT 02/13/2022. FINDINGS: Cardiovascular: The heart is normal in size. No pericardial effusion. 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 Advanced aortic and coronary artery calcifications. Mediastinum/Nodes: Stable 10 mm pretracheal lymph node on image 54/2. No mediastinal or hilar adenopathy. Stable moderate-sized hiatal hernia. Lungs/Pleura: Stable post treatment changes involving the right lung with extensive radiation fibrosis. No findings suspicious for recurrent tumor. 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 The central fiducial is noted. Persistent and progressive left lower lobe interstitial and airspace process likely worsening atypical infection or chronic inflammation. Could not exclude the possibility of aspiration and part of the right lower lobe process could be aspiration also. Small right pleural effusion. No worrisome pulmonary nodules or pulmonary lesions. Upper Abdomen: No significant upper abdominal findings. Stable simple bilateral renal cysts not requiring any further evaluation or follow-up. Stable right renal calculi. Stable advanced aortic and branch vessel calcifications. No hepatic or adrenal gland lesions. Musculoskeletal: No significant bony findings. IMPRESSION: 1. Stable post treatment changes involving the right upper lobe with extensive radiation fibrosis. No findings suspicious for recurrent tumor. 2. 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 Difficult to measure the  actual lesion because of the surrounding changes. Recommend follow-up on future CT scans. 3. Progressive left lower lobe inflammatory or infectious process. Could not exclude aspiration which could be contributing to the right lower lobe process also. 4. Stable 10 mm pretracheal lymph node. 5. Stable moderate-sized hiatal hernia. 6. Stable advanced atherosclerotic calcifications involving the thoracic and abdominal aorta and branch vessels including the coronary arteries. 7. Stable right renal calculi. 8. Aortic atherosclerosis. Aortic Atherosclerosis (ICD10-I70.0). Electronically Signed   By: Marijo Sanes M.D.   On: 07/19/2022 12:43    Impression:  The primary encounter diagnosis was Adenocarcinoma, lung, right ([place]). A diagnosis of Pulmonary nodule was also pertinent to this visit.   RLL nodule suspicious for low grade adenocarcinoma   The patient is recovering from the effects of radiation.  ***  Plan:  ***   *** minutes of total time was spent for this patient encounter, including preparation, face-to-face counseling with the patient and coordination of care, physical exam, and documentation of the encounter. ____________________________________  Blair Promise, PhD, MD  This document serves as a record of services personally performed by Gery Pray, MD. It was created on his behalf by Roney Mans, a trained medical scribe. The creation of this record is based on the scribe's personal observations and the provider's statements to them. This document has been checked and approved by the attending provider.

## 2022-07-20 ENCOUNTER — Ambulatory Visit
Admission: RE | Admit: 2022-07-20 | Discharge: 2022-07-20 | Disposition: A | Discharge status: Home / Self Care | Payer: Medicare Other | Source: Ambulatory Visit | Attending: Radiation Oncology | Admitting: Radiation Oncology

## 2022-07-20 VITALS — BP 187/62 | HR 68 | Temp 97.8°F | Resp 18 | Ht 71.0 in | Wt 186.2 lb

## 2022-07-20 DIAGNOSIS — J700 Acute pulmonary manifestations due to radiation: Secondary | ICD-10-CM | POA: Diagnosis not present

## 2022-07-20 DIAGNOSIS — Z923 Personal history of irradiation: Secondary | ICD-10-CM | POA: Insufficient documentation

## 2022-07-20 DIAGNOSIS — I700 Atherosclerosis of aorta: Secondary | ICD-10-CM | POA: Insufficient documentation

## 2022-07-20 DIAGNOSIS — N200 Calculus of kidney: Secondary | ICD-10-CM | POA: Insufficient documentation

## 2022-07-20 DIAGNOSIS — Z7951 Long term (current) use of inhaled steroids: Secondary | ICD-10-CM | POA: Diagnosis not present

## 2022-07-20 DIAGNOSIS — C3411 Malignant neoplasm of upper lobe, right bronchus or lung: Secondary | ICD-10-CM | POA: Insufficient documentation

## 2022-07-20 DIAGNOSIS — C3491 Malignant neoplasm of unspecified part of right bronchus or lung: Secondary | ICD-10-CM

## 2022-07-20 DIAGNOSIS — J90 Pleural effusion, not elsewhere classified: Secondary | ICD-10-CM | POA: Diagnosis not present

## 2022-07-20 DIAGNOSIS — K449 Diaphragmatic hernia without obstruction or gangrene: Secondary | ICD-10-CM | POA: Insufficient documentation

## 2022-07-20 DIAGNOSIS — Z79899 Other long term (current) drug therapy: Secondary | ICD-10-CM | POA: Insufficient documentation

## 2022-07-20 DIAGNOSIS — R911 Solitary pulmonary nodule: Secondary | ICD-10-CM

## 2022-07-20 NOTE — Progress Notes (Signed)
Conn Trombetta is here today for follow up post radiation to the lung.  Lung Side: right  Completed radiation treatment on: 03/16/2022  Does the patient complain of any of the following: Pain:no Shortness of breath w/wo exertion: no Cough: productive cough with white sputum - reports it is less frequent. Hemoptysis: no Pain with swallowing: no Swallowing/choking concerns: reports he needs to have his throat "stretched" again.  Has to take small bites. Appetite: comes and goes Energy Level: not good Post radiation skin Changes: no    Additional comments if applicable: none noted.  BP (!) 187/62 (BP Location: Left Arm, Patient Position: Sitting)   Pulse 68   Temp 97.8 F (36.6 C) (Oral)   Resp 18   Ht 5\' 11"  (1.803 m)   Wt 186 lb 4 oz (84.5 kg)   SpO2 100%   BMI 25.98 kg/m

## 2022-09-15 ENCOUNTER — Inpatient Hospital Stay: Payer: Medicare Other | Admitting: Oncology

## 2022-09-15 ENCOUNTER — Inpatient Hospital Stay: Payer: Medicare Other

## 2022-09-20 NOTE — Progress Notes (Signed)
[facility]  [address] [place],  [place]  [address] [phone]  Clinic Day:  09/21/2022  Referring physician: Marylen Ponto, MD  HISTORY OF PRESENT ILLNESS:  Christopher Rocha is an 82 y.o. male with a history of iron deficiency anemia, which previously improved after he received IV iron.  He comes in today to reassess his anemia.  Since his last visit, the patient has been doing okay.  He denies having increased fatigue or any overt forms of blood loss which concern him for recurrent iron deficiency anemia.  This gentleman also has lung cancer, including a right lower lobe lung adenocarcinoma for which he underwent stereotactic radiation to this lesion in July 2023.  He previously had a right upper lobe lung mass that was biopsy proven in September 2020 to be adenocarcinoma.  Staging ultimately showed him to have stage IIA (T2b N0 M0) disease.  The patient completed stereotactic radiation to this lesion in November 2020.   Recent scans have shown stability with these lung lesions. He continues to be followed by radiation oncology in [place].  VITALS:  Blood pressure (!) 191/97, pulse (!) 57, temperature 98.3 F (36.8 C), resp. rate 18, height 5\' 11"  (1.803 m), weight 192 lb 1.6 oz (87.1 kg), SpO2 94 %.  Wt Readings from Last 3 Encounters:  09/21/22 192 lb 1.6 oz (87.1 kg)  07/20/22 186 lb 4 oz (84.5 kg)  05/16/22 189 lb 11.2 oz (86 kg)    Body mass index is 26.79 kg/m.  Performance status (ECOG): 0 - Asymptomatic  PHYSICAL EXAM:  Physical Exam Constitutional:      General: He is not in acute distress.    Appearance: Normal appearance. He is normal weight.  HENT:     Head: Normocephalic and atraumatic.  Eyes:     General: No scleral icterus.    Extraocular Movements: Extraocular movements intact.     Conjunctiva/sclera: Conjunctivae normal.     Pupils: Pupils are equal, round, and reactive to light.  Cardiovascular:     Rate and  Rhythm: Normal rate and regular rhythm.     Pulses: Normal pulses.     Heart sounds: Normal heart sounds. No murmur heard.    No friction rub. No gallop.  Pulmonary:     Effort: Pulmonary effort is normal. No respiratory distress.     Breath sounds: Normal breath sounds.  Abdominal:     General: Bowel sounds are normal. There is no distension.     Palpations: Abdomen is soft. There is no hepatomegaly, splenomegaly or mass.     Tenderness: There is no abdominal tenderness.  Musculoskeletal:        General: Normal range of motion.     Cervical back: Normal range of motion and neck supple.     Right lower leg: No edema.     Left lower leg: No edema.  Lymphadenopathy:     Cervical: No cervical adenopathy.  Skin:    General: Skin is warm and dry.  Neurological:     General: No focal deficit present.     Mental Status: He is alert and oriented to person, place, and time. Mental status is at baseline.  Psychiatric:        Mood and Affect: Mood normal.        Behavior: Behavior normal.        Thought Content: Thought content normal.        Judgment: Judgment normal.  LABS:       Latest Ref Rng & Units 09/21/2022   12:00 AM 05/16/2022   12:00 AM 02/13/2022   10:26 AM  CBC  WBC  11.7     10.2     11.9   Hemoglobin 13.5 - 17.5 13.2     12.4     13.4   Hematocrit 41 - 53 39     37     41.1   Platelets 150 - 400 K/uL 189     172     207      This result is from an external source.      Latest Ref Rng & Units 09/21/2022   10:00 AM 02/13/2022   10:26 AM 01/12/2021   12:00 AM  CMP  Glucose 70 - 99 mg/dL 173  140    BUN 8 - 23 mg/dL 23  17  21    Creatinine 0.61 - 1.24 mg/dL 1.17  1.07  0.9   Sodium 135 - 145 mmol/L 134  133  137   Potassium 3.5 - 5.1 mmol/L 4.4  4.7  4.1   Chloride 98 - 111 mmol/L 102  101  101   CO2 22 - 32 mmol/L 23  21  31    Calcium 8.9 - 10.3 mg/dL 9.3  9.1  9.5   Total Protein 6.5 - 8.1 g/dL 7.3     Total Bilirubin 0.3 - 1.2 mg/dL 0.6     Alkaline Phos  38 - 126 U/L 77   96   AST 15 - 41 U/L 18   21   ALT 0 - 44 U/L 14   16     Latest Reference Range & Units 09/21/22 10:00  Iron 45 - 182 ug/dL 64  UIBC ug/dL 266  TIBC 250 - 450 ug/dL 330  Saturation Ratios 17.9 - 39.5 % 19  Ferritin 24 - 336 ng/mL 74     ASSESSMENT & PLAN:  A 82 year old gentleman with a history of iron deficiency anemia. I am very pleased with his hemoglobin and iron levels today.  Based upon today's levels, his anemia will continue to be followed conservatively.  As mentioned previously, he has had 2 prior lung cancers which have been both treated by stereotactic radiation.  He is being followed closely by radiation oncology for these lesions.  Otherwise, as he is doing well from a hematologic perspective, I will see him back in 6 months for repeat clinical assessment. The patient understands all the plans discussed today and is in agreement with them.  Marica Trentham Macarthur Critchley, MD

## 2022-09-21 ENCOUNTER — Inpatient Hospital Stay: Payer: Medicare Other | Admitting: Oncology

## 2022-09-21 ENCOUNTER — Inpatient Hospital Stay: Payer: Medicare Other | Attending: Oncology

## 2022-09-21 VITALS — BP 191/97 | HR 57 | Temp 98.3°F | Resp 18 | Ht 71.0 in | Wt 192.1 lb

## 2022-09-21 DIAGNOSIS — D508 Other iron deficiency anemias: Secondary | ICD-10-CM | POA: Diagnosis not present

## 2022-09-21 DIAGNOSIS — C3491 Malignant neoplasm of unspecified part of right bronchus or lung: Secondary | ICD-10-CM | POA: Diagnosis not present

## 2022-09-21 DIAGNOSIS — D649 Anemia, unspecified: Secondary | ICD-10-CM | POA: Diagnosis not present

## 2022-09-21 DIAGNOSIS — D509 Iron deficiency anemia, unspecified: Secondary | ICD-10-CM | POA: Insufficient documentation

## 2022-09-21 LAB — CMP (CANCER CENTER ONLY)
ALT: 14 U/L (ref 0–44)
AST: 18 U/L (ref 15–41)
Albumin: 3.6 g/dL (ref 3.5–5.0)
Alkaline Phosphatase: 77 U/L (ref 38–126)
Anion gap: 9 (ref 5–15)
BUN: 23 mg/dL (ref 8–23)
CO2: 23 mmol/L (ref 22–32)
Calcium: 9.3 mg/dL (ref 8.9–10.3)
Chloride: 102 mmol/L (ref 98–111)
Creatinine: 1.17 mg/dL (ref 0.61–1.24)
GFR, Estimated: >60 mL/min (ref >60)
Glucose, Bld: 173 mg/dL — ABNORMAL HIGH (ref 70–99)
Potassium: 4.4 mmol/L (ref 3.5–5.1)
Sodium: 134 mmol/L — ABNORMAL LOW (ref 135–145)
Total Bilirubin: 0.6 mg/dL (ref 0.3–1.2)
Total Protein: 7.3 g/dL (ref 6.5–8.1)

## 2022-09-21 LAB — IRON AND TIBC
Iron: 64 ug/dL (ref 45–182)
Saturation Ratios: 19 % (ref 17.9–39.5)
TIBC: 330 ug/dL (ref 250–450)
UIBC: 266 ug/dL

## 2022-09-21 LAB — CBC AND DIFFERENTIAL
HCT: 39 — AB (ref 41–53)
Hemoglobin: 13.2 — AB (ref 13.5–17.5)
Neutrophils Absolute: 7.72
Platelets: 189 10*3/uL (ref 150–400)
WBC: 11.7

## 2022-09-21 LAB — FERRITIN: Ferritin: 74 ng/mL (ref 24–336)

## 2022-09-21 LAB — CBC: RBC: 4.44 (ref 3.87–5.11)

## 2022-09-28 ENCOUNTER — Telehealth: Payer: Self-pay | Admitting: *Deleted

## 2022-09-28 NOTE — Telephone Encounter (Signed)
CALLED PATIENT TO INFORM OF CT FOR 10-20-22- ARRIVAL TIME- 2:30 PM @ [facility], NO RESTRICTIONS TO TEST, PATIENT TO RECEIVE RESULTS FROM DR. KINARD ON 10-23-22 @ 11:45 AM, SPOKE WITH PATIENT AND HE IS AWARE OF THESE APPTS. AND THE INSTRUCTIONS

## 2022-10-16 NOTE — Progress Notes (Signed)
Christopher Rocha is here today for follow up post radiation to the lung.  Lung Side: Right,patient completed treatment on 03/16/22.  Does the patient complain of any of the following: Pain:No Shortness of breath w/wo exertion: No Cough: Yes, productive Hemoptysis: No Pain with swallowing: No Swallowing/choking concerns: No Appetite: Good Energy Level: Low energy  Post radiation skin Changes: Dry skin to chest and back.     Additional comments if applicable: Noted patients blood pressure being elevated   Reports taking  losartan- HCTZ last night around 11pm. Reports mild dizziness when he first arrive. Which has now resolved. Denies any headaches or other issues.   BP (!) 198/78 (BP Location: Left Arm, Patient Position: Sitting)   Temp (!) 97.5 F (36.4 C) (Temporal)   Resp 18   Ht 5\' 11"  (1.803 m)   Wt 196 lb 4 oz (89 kg)   SpO2 98%   BMI 27.37 kg/m

## 2022-10-20 ENCOUNTER — Ambulatory Visit (HOSPITAL_COMMUNITY)
Admission: RE | Admit: 2022-10-20 | Discharge: 2022-10-20 | Disposition: A | Discharge status: Home / Self Care | Payer: Medicare Other | Source: Ambulatory Visit | Attending: Radiation Oncology | Admitting: Radiation Oncology

## 2022-10-20 DIAGNOSIS — R918 Other nonspecific abnormal finding of lung field: Secondary | ICD-10-CM | POA: Diagnosis not present

## 2022-10-20 DIAGNOSIS — C3491 Malignant neoplasm of unspecified part of right bronchus or lung: Secondary | ICD-10-CM | POA: Diagnosis not present

## 2022-10-20 DIAGNOSIS — C3490 Malignant neoplasm of unspecified part of unspecified bronchus or lung: Secondary | ICD-10-CM | POA: Diagnosis not present

## 2022-10-22 NOTE — Progress Notes (Signed)
Radiation Oncology         [phone] ________________________________  Name: Christopher Rocha MRN: [id number]  Date: 10/23/2022  DOB: 03-Apr-1941  Follow-Up Visit Note  CC: [name], MD  [name], MD    ICD-10-CM   1. Adenocarcinoma, lung, right ([place])  C34.91 NM PET Image Restag (PS) Skull Base To Thigh      Diagnosis: The primary encounter diagnosis was Adenocarcinoma, lung, right ([place]). A diagnosis of Pulmonary nodule was also pertinent to this visit.   Interval Since Last Radiation: 7 months and 6 days   Intent: Curative  Radiation Treatment Dates: 03/09/2022 through 03/16/2022 Site Technique Total Dose (Gy) Dose per Fx (Gy) Completed Fx Beam Energies  Lung, Right: Lung_R IMRT 54/54 18 3/3 6XFFF   Narrative:  The patient returns today for routine follow-up and to review recent imaging. He was last seen here for follow-up on 07/20/22.   Since his last visit, the patient met with [name] at the [facility] on 10/01/22 for follow-up of his iron deficiency anemia. During which time, the patient denied any increased fatigue or any overt forms of blood loss concerning for recurrent anemia. Labs collected on the date of this visit showed normal iron and near normal Hgb at 13.2. The patient will continue to follow-up with [name] every 6 months for repeat labs.       His most recent chest CT without contrast on 10/20/22 shows a 10 mm LLL pulmonary nodule. This was apparently obscured by surrounding airspace disease on previous studies (since resolved) from 07/18/2022 and 02/13/2022. However upon record review, this LLL nodule was visible on a chest CT performed in March 2023, and measured 5 mm at that time. Given interval growth, the nodule is possibly concerning for metastatic disease vs a metachronous primary. Other findings include stable post treatment scarring in the posterior right apex and posterior right lung base. CT otherwise shows no other  abnormalities in either lung.              On evaluation today the patient denies any changes in his breathing.  He has occasional cough which he relates to postnasal drip.  He denies any pain within the chest area or hemoptysis.  Allergies:  is allergic to penicillins.  Meds: Current Outpatient Medications  Medication Sig Dispense Refill   acetaminophen (TYLENOL) 500 MG tablet Take 500 mg by mouth every 8 (eight) hours as needed (pain).     aspirin EC 81 MG tablet Take 81 mg by mouth every other day.      budesonide-formoterol (SYMBICORT) 160-4.5 MCG/ACT inhaler Inhale 2 puffs into the lungs 2 (two) times daily. 1 Inhaler 5   ibuprofen (ADVIL) 200 MG tablet Take 200 mg by mouth every 6 (six) hours as needed for mild pain.     losartan-hydrochlorothiazide (HYZAAR) 100-12.5 MG tablet Take 1 tablet by mouth daily.     Naphazoline-Glycerin (REDNESS RELIEF OP) Place 1 drop into both eyes daily as needed (for redness/itching).     PROAIR HFA 108 (90 Base) MCG/ACT inhaler Inhale 2 puffs into the lungs every 4 (four) hours as needed for shortness of breath.     triamcinolone (KENALOG) 0.1 % Apply 1 application. topically 3 (three) times daily as needed (itching).     rosuvastatin (CRESTOR) 10 MG tablet Take 1 tablet by mouth once daily (Patient not taking: Reported on 10/23/2022) 90 tablet 2   No current facility-administered medications for this encounter.  Physical Findings: The patient is in no acute distress. Patient is alert and oriented.  height is 5\' 11"  (1.803 m) and weight is 196 lb 4 oz (89 kg). His temporal temperature is 97.5 F (36.4 C) (abnormal). His blood pressure is 198/78 (abnormal). His respiration is 18 and oxygen saturation is 98%. .  Lungs are clear to auscultation bilaterally. Heart has regular rate and rhythm. No palpable cervical, supraclavicular, or axillary adenopathy. Abdomen soft, non-tender, normal bowel sounds.   Lab Findings: Lab Results  Component Value Date    WBC 11.7 09/21/2022   HGB 13.2 (A) 09/21/2022   HCT 39 (A) 09/21/2022   MCV 91.1 02/13/2022   PLT 189 09/21/2022    Radiographic Findings: CT CHEST WO CONTRAST  Result Date: 10/22/2022 CLINICAL DATA:  Non-small-cell lung cancer. Restaging. * Tracking Code: BO * EXAM: CT CHEST WITHOUT CONTRAST TECHNIQUE: Multidetector CT imaging of the chest was performed following the standard protocol without IV contrast. RADIATION DOSE REDUCTION: This exam was performed according to the departmental dose-optimization program which includes automated exposure control, adjustment of the mA and/or kV according to patient size and/or use of iterative reconstruction technique. COMPARISON:  07/18/2022 FINDINGS: Cardiovascular: The heart size is normal. No substantial pericardial effusion. Status post TAVR. Coronary artery calcification is evident. Moderate atherosclerotic calcification is noted in the wall of the thoracic aorta. Mediastinum/Nodes: Stable 10 mm precarinal lymph node on 56/2 today. No other mediastinal lymphadenopathy. No evidence for gross hilar lymphadenopathy although assessment is limited by the lack of intravenous contrast on the current study. Small to moderate hiatal hernia. The esophagus has normal imaging features. There is no axillary lymphadenopathy. Lungs/Pleura: Stable post treatment scarring posterior right apex. The ill-defined patchy airspace disease in the posterior right lung base is similar in the interval with localization clip again noted. The left base patchy airspace disease seen previously has largely resolved although now there is a 10 mm left lower lobe nodule visible on image 103/5. This was largely obscured by the surrounding airspace disease on the previous study and to a lesser degree obscured on an exam from 02/13/2022 although this nodule was present at that time measuring 6 mm (image 116/4 of the 02/13/2022 exam). This nodule was 5 mm on chest CT of 11/07/2021 (image 97/5). No  substantial pleural effusion on today's study. Upper Abdomen: Benign cyst noted in the visualized portion of each kidney. No followup imaging is recommended. Nonobstructing tiny stones noted upper pole right kidney. Musculoskeletal: No worrisome lytic or sclerotic osseous abnormality. IMPRESSION: 1. 10 mm left lower lobe pulmonary nodule. This was obscured by the surrounding airspace disease on the previous studies from 07/18/2022 and 02/13/2022, which is now largely resolved. This nodule was present at 5 mm on chest CT of 11/07/2021. Given the continued slow interval growth, metastatic disease or metachronous primary is a concern. 2. Stable post treatment scarring posterior right apex and posterior right lung base. 3. Small to moderate hiatal hernia. 4. Nonobstructing right renal stone. 5.  Aortic Atherosclerosis (ICD10-I70.0). These results will be called to the ordering clinician or representative by the Radiologist Assistant, and communication documented in the PACS or [employer]. Electronically Signed   By: Misty Stanley M.D.   On: 10/22/2022 09:17    Impression: The primary encounter diagnosis was Adenocarcinoma, lung, right ([place]). A diagnosis of Pulmonary nodule was also pertinent to this visit.   Recent chest CT scan favorable for his 2 lesions treated in the right lung however in the left lower  lobe there is a suspicious nodule which measures approximately 1 cm in size.  This is increased from 5 mm from the scan approximately a year ago.  I discussed this left lower lobe nodule in detail with patient.  He would like to proceed with PET scan for further evaluation.  Plan: PET scan in the near future. He will follow-up soon after PET scan is complete for review and planning of additional potential therapy.   23 minutes of total time was spent for this patient encounter, including preparation, face-to-face counseling with the patient and coordination of care, physical exam, and documentation  of the encounter. ____________________________________  Blair Promise, PhD, MD  This document serves as a record of services personally performed by Gery Pray, MD. It was created on his behalf by Roney Mans, a trained medical scribe. The creation of this record is based on the scribe's personal observations and the provider's statements to them. This document has been checked and approved by the attending provider.

## 2022-10-23 ENCOUNTER — Ambulatory Visit
Admission: RE | Admit: 2022-10-23 | Discharge: 2022-10-23 | Disposition: A | Discharge status: Home / Self Care | Payer: Medicare Other | Source: Ambulatory Visit | Attending: Radiation Oncology | Admitting: Radiation Oncology

## 2022-10-23 ENCOUNTER — Encounter: Payer: Self-pay | Admitting: Radiation Oncology

## 2022-10-23 VITALS — BP 198/78 | Temp 97.5°F | Resp 18 | Ht 71.0 in | Wt 196.2 lb

## 2022-10-23 DIAGNOSIS — C3491 Malignant neoplasm of unspecified part of right bronchus or lung: Secondary | ICD-10-CM

## 2022-10-23 DIAGNOSIS — J984 Other disorders of lung: Secondary | ICD-10-CM | POA: Insufficient documentation

## 2022-10-23 DIAGNOSIS — N200 Calculus of kidney: Secondary | ICD-10-CM | POA: Diagnosis not present

## 2022-10-23 DIAGNOSIS — R918 Other nonspecific abnormal finding of lung field: Secondary | ICD-10-CM | POA: Insufficient documentation

## 2022-10-23 DIAGNOSIS — Z79899 Other long term (current) drug therapy: Secondary | ICD-10-CM | POA: Diagnosis not present

## 2022-10-23 DIAGNOSIS — K449 Diaphragmatic hernia without obstruction or gangrene: Secondary | ICD-10-CM | POA: Diagnosis not present

## 2022-10-23 DIAGNOSIS — C3411 Malignant neoplasm of upper lobe, right bronchus or lung: Secondary | ICD-10-CM | POA: Diagnosis not present

## 2022-10-23 DIAGNOSIS — Z7951 Long term (current) use of inhaled steroids: Secondary | ICD-10-CM | POA: Diagnosis not present

## 2022-10-23 NOTE — Addendum Note (Signed)
Encounter addended by: Sherrlyn Hock, RN on: 10/23/2022 1:16 PM  Actions taken: Charge Capture section accepted

## 2022-10-30 ENCOUNTER — Telehealth: Payer: Self-pay | Admitting: *Deleted

## 2022-10-30 NOTE — Telephone Encounter (Signed)
Returned patient's relative's phone call, [name], spoke with Ms. Christopher Rocha

## 2022-11-02 ENCOUNTER — Ambulatory Visit: Payer: Medicare Other | Admitting: Radiation Oncology

## 2022-11-06 NOTE — Progress Notes (Signed)
Kasper Gallucci is here today for follow up post radiation to the lung.  Lung Side: right lung  patient completed treatment on 03/16/22  Does the patient complain of any of the following: Pain:No Shortness of breath w/wo exertion: No Cough: Yes, productive.  Hemoptysis: No Pain with swallowing: Yes  Swallowing/choking concerns: No Appetite: Good  Energy Level: Low Post radiation skin Changes: No    Additional comments if applicable:  BP (!) [id number] (BP Location: Left Arm, Patient Position: Sitting, Cuff Size: Large)   Pulse (!) 57   Temp 97.7 F (36.5 C)   Resp 20   Ht '5\' 11"'$  (1.803 m)   Wt 197 lb 9.6 oz (89.6 kg)   SpO2 98%   BMI 27.56 kg/m

## 2022-11-09 ENCOUNTER — Ambulatory Visit (HOSPITAL_COMMUNITY)
Admission: RE | Admit: 2022-11-09 | Discharge: 2022-11-09 | Disposition: A | Discharge status: Home / Self Care | Payer: Medicare Other | Source: Ambulatory Visit | Attending: Radiation Oncology | Admitting: Radiation Oncology

## 2022-11-09 DIAGNOSIS — C3491 Malignant neoplasm of unspecified part of right bronchus or lung: Secondary | ICD-10-CM | POA: Insufficient documentation

## 2022-11-09 DIAGNOSIS — C3490 Malignant neoplasm of unspecified part of unspecified bronchus or lung: Secondary | ICD-10-CM | POA: Diagnosis not present

## 2022-11-09 LAB — GLUCOSE, CAPILLARY: Glucose-Capillary: 135 mg/dL — ABNORMAL HIGH (ref 70–99)

## 2022-11-09 MED ORDER — FLUDEOXYGLUCOSE F - 18 (FDG) INJECTION
9.2000 | Freq: Once | INTRAVENOUS | Status: AC
  Administered 2022-11-09: 9.32 via INTRAVENOUS

## 2022-11-11 NOTE — Progress Notes (Signed)
Radiation Oncology         (336) [phone] ________________________________  Name: Christopher Rocha MRN: [id number]  Date: 11/13/2022  DOB: 1940/12/09  Follow-Up Visit Note  CC: [name], MD  [name], MD  No diagnosis found.  Diagnosis: The primary encounter diagnosis was Adenocarcinoma, lung, right ([place]). A diagnosis of Pulmonary nodule was also pertinent to this visit.    Interval Since Last Radiation: about 8 months   Intent: Curative  Radiation Treatment Dates: 03/09/2022 through 03/16/2022 Site Technique Total Dose (Gy) Dose per Fx (Gy) Completed Fx Beam Energies  Lung, Right: Lung_R IMRT 54/54 18 3/3 6XFFF   Narrative:  The patient returns today for follow-up and to review recent PET scan results. She was last seen here for follow-up on 10/23/22. To review from her last visit, the patient had a follow-up chest CT on 10/20/22 which demonstrated an increase in size of a suspicious LLL nodule measuring 1 cm (previously measuring 5 mm). For further evaluation of this, I recommended proceeding with a restaging PET scan.   Subsequent PET scan on 11/09/22 shows the 1 cm LLL with an SUV max of 2.3 concerning for malignancy. PET also shows the known airspace opacity in the RLL with an max SUV max of 4.2 (likely related to radiation therapy); stable appearance of the RUL consolidated region with homogeneous low-grade activity, (maximum SUV 3.5, previously 3.7), likely reflecting prior radiation therapy; likely benign and indeterminate asymmetric right glottic activity with a max SUV of 9.4 on the right and 3.8 on the left; and a new small right sided pleural effusion.                     Allergies:  is allergic to penicillins.  Meds: Current Outpatient Medications  Medication Sig Dispense Refill   acetaminophen (TYLENOL) 500 MG tablet Take 500 mg by mouth every 8 (eight) hours as needed (pain).     aspirin EC 81 MG tablet Take 81 mg by mouth every other day.       budesonide-formoterol (SYMBICORT) 160-4.5 MCG/ACT inhaler Inhale 2 puffs into the lungs 2 (two) times daily. 1 Inhaler 5   ibuprofen (ADVIL) 200 MG tablet Take 200 mg by mouth every 6 (six) hours as needed for mild pain.     losartan-hydrochlorothiazide (HYZAAR) 100-12.5 MG tablet Take 1 tablet by mouth daily.     Naphazoline-Glycerin (REDNESS RELIEF OP) Place 1 drop into both eyes daily as needed (for redness/itching).     PROAIR HFA 108 (90 Base) MCG/ACT inhaler Inhale 2 puffs into the lungs every 4 (four) hours as needed for shortness of breath.     rosuvastatin (CRESTOR) 10 MG tablet Take 1 tablet by mouth once daily (Patient not taking: Reported on 10/23/2022) 90 tablet 2   triamcinolone (KENALOG) 0.1 % Apply 1 application. topically 3 (three) times daily as needed (itching).     No current facility-administered medications for this encounter.    Physical Findings: The patient is in no acute distress. Patient is alert and oriented.  vitals were not taken for this visit. .  No significant changes. Lungs are clear to auscultation bilaterally. Heart has regular rate and rhythm. No palpable cervical, supraclavicular, or axillary adenopathy. Abdomen soft, non-tender, normal bowel sounds.   Lab Findings: Lab Results  Component Value Date   WBC 11.7 09/21/2022   HGB 13.2 (A) 09/21/2022   HCT 39 (A) 09/21/2022   MCV 91.1 02/13/2022   PLT 189 09/21/2022  Radiographic Findings: NM PET Image Restag (PS) Skull Base To Thigh  Result Date: 11/10/2022 CLINICAL DATA:  Subsequent treatment strategy for non-small cell lung cancer. EXAM: NUCLEAR MEDICINE PET SKULL BASE TO THIGH TECHNIQUE: 9.3 mCi F-18 FDG was injected intravenously. Full-ring PET imaging was performed from the skull base to thigh after the radiotracer. CT data was obtained and used for attenuation correction and anatomic localization. Fasting blood glucose: One hundred thirty-five mg/dl COMPARISON:  11/21/2021 FINDINGS: Mediastinal  blood pool activity: SUV max 2.5 Liver activity: SUV max NA NECK: Asymmetric right glottic activity, maximum SUV 9.4 on the right and 3.8 on the left, no mass or asymmetry on the CT data, significance uncertain. Incidental CT findings: Bilateral common carotid atherosclerotic vascular calcification. CHEST: The consolidated region of the right upper lobe measures about 6.0 by 3.8 cm on image 55 series 4 which is roughly stable, maximum SUV in this vicinity 3.5, formerly 3.7. The region demonstrates homogeneous accentuated uptake as before. Maximum SUV 2.2, formerly 0.9 cm with maximum SUV of 1.9. This still remains below blood pool. Increased airspace opacity in the right lower lobe along the previous region of ground-glass glass opacity. There is currently overt airspace opacity in this region measuring about 5.0 by 3.9 cm on image 53 series 7 with maximum SUV 4.2. On the prior PET-CT of 11/21/2021 the ground-glass opacity in this vicinity measured about 1.9 by 1.5 cm although intervening chest CT have demonstrated graft airspace opacity more similar to the current exam. There a fiducial centrally within this process and I suspect that much of this represents radiation therapy related findings. A left lower lobe peribronchovascular nodule measures 1.0 cm in diameter on image 52 of series 7, maximum SUV 2.3. Incidental accentuated activity along the bandlike density the left ventricular apex on image 96 series 4, benign. Incidental CT findings: Coronary, aortic arch, and branch vessel atherosclerotic vascular disease. Small right pleural effusion. Mitral valve calcification. TAVR noted. Moderate-sized hiatal hernia. ABDOMEN/PELVIS: No significant abnormal hypermetabolic activity in this region. Incidental CT findings: Atherosclerosis is present, including aortoiliac atherosclerotic disease. Benign renal cysts warrant no further workup. SKELETON: Activity at the left L5-S1 facet joint is considered benign. No  significant abnormal hypermetabolic activity along the skeleton. Incidental CT findings: Right lateral rib deformities compatible with old fractures. Grade 1 anterolisthesis at L4-5. IMPRESSION: 1. Airspace opacity in the right lower lobe along the previous region of ground-glass opacity, with maximum SUV of 4.2. This is probably primarily due to radiation therapy. 2. Stable appearance of the right upper lobe consolidated region with homogeneous low-grade activity, maximum SUV 3.5, previously 3.7, likely reflecting prior radiation therapy. 3. 1.0 cm left lower lobe peribronchovascular nodule as seen on recent CT scans, maximum SUV 2.3. This has been observed to increase on serial CT scans, suspicious for malignancy. 4. Asymmetric right glottic activity, maximum SUV 9.4 on the right and 3.8 on the left, no mass or asymmetry on the CT data, significance uncertain but probably benign. 5. Small new right pleural effusion. 6. Moderate-sized hiatal hernia. 7. Aortic, coronary, and systemic atherosclerosis. Mitral valve calcification and prior TAVR. Aortic Atherosclerosis (ICD10-I70.0). Electronically Signed   By: Van Clines M.D.   On: 11/10/2022 16:25   CT CHEST WO CONTRAST  Result Date: 10/22/2022 CLINICAL DATA:  Non-small-cell lung cancer. Restaging. * Tracking Code: BO * EXAM: CT CHEST WITHOUT CONTRAST TECHNIQUE: Multidetector CT imaging of the chest was performed following the standard protocol without IV contrast. RADIATION DOSE REDUCTION: This exam was performed  according to the departmental dose-optimization program which includes automated exposure control, adjustment of the mA and/or kV according to patient size and/or use of iterative reconstruction technique. COMPARISON:  07/18/2022 FINDINGS: Cardiovascular: The heart size is normal. No substantial pericardial effusion. Status post TAVR. Coronary artery calcification is evident. Moderate atherosclerotic calcification is noted in the wall of the  thoracic aorta. Mediastinum/Nodes: Stable 10 mm precarinal lymph node on 56/2 today. No other mediastinal lymphadenopathy. No evidence for gross hilar lymphadenopathy although assessment is limited by the lack of intravenous contrast on the current study. Small to moderate hiatal hernia. The esophagus has normal imaging features. There is no axillary lymphadenopathy. Lungs/Pleura: Stable post treatment scarring posterior right apex. The ill-defined patchy airspace disease in the posterior right lung base is similar in the interval with localization clip again noted. The left base patchy airspace disease seen previously has largely resolved although now there is a 10 mm left lower lobe nodule visible on image 103/5. This was largely obscured by the surrounding airspace disease on the previous study and to a lesser degree obscured on an exam from 02/13/2022 although this nodule was present at that time measuring 6 mm (image 116/4 of the 02/13/2022 exam). This nodule was 5 mm on chest CT of 11/07/2021 (image 97/5). No substantial pleural effusion on today's study. Upper Abdomen: Benign cyst noted in the visualized portion of each kidney. No followup imaging is recommended. Nonobstructing tiny stones noted upper pole right kidney. Musculoskeletal: No worrisome lytic or sclerotic osseous abnormality. IMPRESSION: 1. 10 mm left lower lobe pulmonary nodule. This was obscured by the surrounding airspace disease on the previous studies from 07/18/2022 and 02/13/2022, which is now largely resolved. This nodule was present at 5 mm on chest CT of 11/07/2021. Given the continued slow interval growth, metastatic disease or metachronous primary is a concern. 2. Stable post treatment scarring posterior right apex and posterior right lung base. 3. Small to moderate hiatal hernia. 4. Nonobstructing right renal stone. 5.  Aortic Atherosclerosis (ICD10-I70.0). These results will be called to the ordering clinician or representative by  the Radiologist Assistant, and communication documented in the PACS or [employer]. Electronically Signed   By: Misty Stanley M.D.   On: 10/22/2022 09:17    Impression: The primary encounter diagnosis was Adenocarcinoma, lung, right ([place]). A diagnosis of Pulmonary nodule was also pertinent to this visit.    The patient is recovering from the effects of radiation.  ***  Plan:  ***   *** minutes of total time was spent for this patient encounter, including preparation, face-to-face counseling with the patient and coordination of care, physical exam, and documentation of the encounter. ____________________________________  Blair Promise, PhD, MD  This document serves as a record of services personally performed by Gery Pray, MD. It was created on his behalf by Roney Mans, a trained medical scribe. The creation of this record is based on the scribe's personal observations and the provider's statements to them. This document has been checked and approved by the attending provider.

## 2022-11-13 ENCOUNTER — Encounter: Payer: Self-pay | Admitting: Radiation Oncology

## 2022-11-13 ENCOUNTER — Telehealth: Payer: Self-pay | Admitting: *Deleted

## 2022-11-13 ENCOUNTER — Ambulatory Visit
Admission: RE | Admit: 2022-11-13 | Discharge: 2022-11-13 | Disposition: A | Discharge status: Home / Self Care | Payer: Medicare Other | Source: Ambulatory Visit | Attending: Radiation Oncology | Admitting: Radiation Oncology

## 2022-11-13 VITALS — BP 202/67 | HR 57 | Temp 97.7°F | Resp 20 | Ht 71.0 in | Wt 197.6 lb

## 2022-11-13 DIAGNOSIS — C3411 Malignant neoplasm of upper lobe, right bronchus or lung: Secondary | ICD-10-CM | POA: Diagnosis not present

## 2022-11-13 DIAGNOSIS — Z7951 Long term (current) use of inhaled steroids: Secondary | ICD-10-CM | POA: Diagnosis not present

## 2022-11-13 DIAGNOSIS — M954 Acquired deformity of chest and rib: Secondary | ICD-10-CM | POA: Insufficient documentation

## 2022-11-13 DIAGNOSIS — N200 Calculus of kidney: Secondary | ICD-10-CM | POA: Insufficient documentation

## 2022-11-13 DIAGNOSIS — J90 Pleural effusion, not elsewhere classified: Secondary | ICD-10-CM | POA: Insufficient documentation

## 2022-11-13 DIAGNOSIS — Z79899 Other long term (current) drug therapy: Secondary | ICD-10-CM | POA: Diagnosis not present

## 2022-11-13 DIAGNOSIS — K449 Diaphragmatic hernia without obstruction or gangrene: Secondary | ICD-10-CM | POA: Diagnosis not present

## 2022-11-13 DIAGNOSIS — C3491 Malignant neoplasm of unspecified part of right bronchus or lung: Secondary | ICD-10-CM

## 2022-11-13 DIAGNOSIS — R911 Solitary pulmonary nodule: Secondary | ICD-10-CM

## 2022-11-13 NOTE — Telephone Encounter (Signed)
CALLED PATIENT TO INFORM OF AN APPT. WITH DR. Lamonte Sakai ON 11-24-22- ARRIVAL TIME- 10:15 AM , [address], SPOKE WITH PATIENT AND HE IS AWARE OF THIS APPT.

## 2022-11-24 ENCOUNTER — Encounter: Payer: Self-pay | Admitting: Emergency Medicine

## 2022-11-24 ENCOUNTER — Other Ambulatory Visit: Payer: Self-pay

## 2022-11-24 ENCOUNTER — Encounter (HOSPITAL_COMMUNITY): Payer: Self-pay | Admitting: Emergency Medicine

## 2022-11-24 ENCOUNTER — Ambulatory Visit: Payer: Medicare Other | Admitting: Emergency Medicine

## 2022-11-24 VITALS — BP 180/88 | HR 71 | Temp 97.6°F | Ht 71.0 in | Wt 201.6 lb

## 2022-11-24 DIAGNOSIS — J301 Allergic rhinitis due to pollen: Secondary | ICD-10-CM | POA: Diagnosis not present

## 2022-11-24 DIAGNOSIS — J411 Mucopurulent chronic bronchitis: Secondary | ICD-10-CM | POA: Diagnosis not present

## 2022-11-24 DIAGNOSIS — R911 Solitary pulmonary nodule: Secondary | ICD-10-CM

## 2022-11-24 DIAGNOSIS — Z01818 Encounter for other preprocedural examination: Secondary | ICD-10-CM | POA: Diagnosis not present

## 2022-11-24 DIAGNOSIS — J309 Allergic rhinitis, unspecified: Secondary | ICD-10-CM | POA: Insufficient documentation

## 2022-11-24 HISTORY — DX: Allergic rhinitis, unspecified: J30.9

## 2022-11-24 MED ORDER — FLUTICASONE PROPIONATE 50 MCG/ACT NA SUSP: 2.0000 | Freq: Every day | NASAL | 2 refills | Status: DC

## 2022-11-24 NOTE — Addendum Note (Signed)
Addended by: Loma Sousa on: 11/24/2022 11:07 AM   Modules accepted: Orders

## 2022-11-24 NOTE — Patient Instructions (Addendum)
We reviewed your CT scan of the chest and your PET scan today. We will work on arranging a navigational bronchoscopy to evaluate an enlarging left lower lobe pulmonary nodule.  This will be done as an outpatient at [facility] endoscopy under general anesthesia.  You will need a designated driver.  Please stop your aspirin 2 days prior.  We will try to get this set up for 11/27/2022. Continue your Symbicort 2 puffs twice a day.  Remember to rinse gargle after using. Keep albuterol available to use 2 puffs up to every 4 hours if needed for shortness of breath, chest tightness, wheezing.  Try starting loratadine 10 mg (generic Claritin) once daily during the allergy season Try starting fluticasone nasal spray, 2 sprays each nostril once daily during the allergy season (generic Flonase) Follow Dr. Lamonte Sakai in 1 month or next available

## 2022-11-24 NOTE — H&P (View-Only) (Signed)
Subjective:    Patient ID: Christopher Rocha, male    DOB: Aug 01, 1941, 82 y.o.   MRN: [id number]  HPI 82 year old gentleman (hx of 47 + pk/yrs) history of of COPD, CAD/CABG, hypertension with diastolic CHF, atrial fibrillation, severe AS post TAVR.  He has a history of stage IIA right upper lobe adenocarcinoma, followed by Dr. Sondra Come in radiation oncology.  He is here for an abnormal CT scan of the chest. He is now off Eliquis. Had been seen most recently by Dr Geraldo Pitter, wants to see Kathlene November at Cardiology.   CT chest 11/07/2021 reviewed by me, shows stable treated right upper lobe lesion, an enlarging and increasingly solid right lower lobe pulmonary nodule suspicious for adenocarcinoma, now 5.7 x 3.8 cm with a 2.2 cm solid component.    PET scan performed on 11/21/2021 reviewed by me shows low-level FDG activity in the right lower lobe pulmonary nodule.  No evidence of hypermetabolic metastatic disease or adenopathy.   ROV 11/24/22 --82 year old man with history of former tobacco, COPD, CAD/CABG, hypertension and diastolic dysfunction, atrial fibrillation, severe AS with TAVR.  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  I saw him for an enlarging increasingly solid right lower lobe pulmonary nodule 5.7 x 3.8 cm with a 2.2 cm solid component.  This prompted robotic navigational bronchoscopy 02/13/2022 that showed adenocarcinoma.  He was treated again with SBRT by Dr. Sondra Come.  Subsequent surveillance imaging has shown left lower lobe nodule that is increasing in size.  He had a PET scan on 11/09/2022.   PET scan 11/09/2022 shows 1 cm left lower lobe pulmonary nodule with an SUV max of 2.3.  That scan also shows known right lower lobe airspace opacity with SUV max 4.2, stable appearance of the right upper lobe consolidative region with homogeneous low-grade activity (both areas that were previously treated)   Review of Systems As per HPI  Past  Medical History:  Diagnosis Date   Acute on chronic diastolic heart failure ([place]) 07/02/2018   Adenocarcinoma, lung, right ([place]) 10/27/2019   Anemia    iron deficiency    CAD (coronary artery disease) 03/25/2018   COPD (chronic obstructive pulmonary disease) ([place]) 06/14/2018   05/2018 FEV1 60%, DLCO 53%   Coronary artery disease    Essential hypertension 03/25/2018   History of radiation therapy 06/10/2019   right lung 06/03/2019-06/10/2019  Dr Gery Pray   History of radiation therapy    Right Lung- 03/09/22-03/16/22- Dr.James Kinard   HLD (hyperlipidemia)    Hx of CABG 03/25/2018   Iron deficiency anemia 01/12/2021   Mixed dyslipidemia 03/25/2018   Pulmonary nodule    S/P CABG (coronary artery bypass graft)    S/P TAVR (transcatheter aortic valve replacement)    Edwards Sapien XT THV (size 29 mm, model # 9300TFX, serial # [id number])   Severe aortic stenosis    Unspecified atrial fibrillation ([place]) 08/19/2020     Family History  Problem Relation Age of Onset   Diabetes Mother    Clotting disorder Father      Social History   Socioeconomic History   Marital status: Widowed    Spouse name: Not on file   Number of children: Not on file   Years of education: Not on file   Highest education level: Not on file  Occupational History   Not on file  Tobacco Use   Smoking status: Former    Packs/day: 2  Types: Cigarettes    Quit date: 02/16/1988    Years since quitting: 34.7   Smokeless tobacco: Never  Vaping Use   Vaping Use: Never used  Substance and Sexual Activity   Alcohol use: Not Currently   Drug use: Never   Sexual activity: Not on file  Other Topics Concern   Not on file  Social History Narrative   Not on file   Social Determinants of Health   Financial Resource Strain: Not on file  Food Insecurity: Not on file  Transportation Needs: Not on file  Physical Activity: Not on file  Stress: Not on file  Social Connections: Not on file  Intimate Partner  Violence: Not on file     Allergies  Allergen Reactions   Penicillins Hives and Rash    Did it involve swelling of the face/tongue/throat, SOB, or low BP? No Did it involve sudden or severe rash/hives, skin peeling, or any reaction on the inside of your mouth or nose? No Did you need to seek medical attention at a hospital or doctor's office? No When did it last happen?      10-15 years If all above answers are "NO", may proceed with cephalosporin use.       Outpatient Medications Prior to Visit  Medication Sig Dispense Refill   acetaminophen (TYLENOL) 500 MG tablet Take 500 mg by mouth every 8 (eight) hours as needed (pain).     aspirin EC 81 MG tablet Take 81 mg by mouth every other day.      budesonide-formoterol (SYMBICORT) 160-4.5 MCG/ACT inhaler Inhale 2 puffs into the lungs 2 (two) times daily. 1 Inhaler 5   ibuprofen (ADVIL) 200 MG tablet Take 200 mg by mouth every 6 (six) hours as needed for mild pain.     losartan-hydrochlorothiazide (HYZAAR) 100-12.5 MG tablet Take 1 tablet by mouth daily.     Naphazoline-Glycerin (REDNESS RELIEF OP) Place 1 drop into both eyes daily as needed (for redness/itching).     PROAIR HFA 108 (90 Base) MCG/ACT inhaler Inhale 2 puffs into the lungs every 4 (four) hours as needed for shortness of breath.     rosuvastatin (CRESTOR) 10 MG tablet Take 1 tablet by mouth once daily (Patient not taking: Reported on 10/23/2022) 90 tablet 2   triamcinolone (KENALOG) 0.1 % Apply 1 application. topically 3 (three) times daily as needed (itching).     No facility-administered medications prior to visit.         Objective:   Physical Exam  Vitals:   11/24/22 1018  BP: (!) 180/88  Pulse: 71  Temp: 97.6 F (36.4 C)  TempSrc: Oral  SpO2: 97%  Weight: 201 lb 9.6 oz (91.4 kg)  Height: 5\' 11"  (1.803 m)   Gen: Pleasant, well-nourished, in no distress,  normal affect  ENT: No lesions,  mouth clear,  oropharynx clear, no postnasal drip  Neck: No JVD, no  stridor  Lungs: No use of accessory muscles, distant, no crackles or wheezing on normal respiration, no wheeze on forced expiration  Cardiovascular: RRR, 3/6 syst M  Musculoskeletal: No deformities, no cyanosis or clubbing  Neuro: alert, awake, non focal  Skin: Warm, no lesions or rash      Assessment & Plan:  Pulmonary nodule Slowly enlarging left lower lobe pulmonary nodule on his surveillance imaging.  Discussed navigational bronchoscopy with him to achieve a tissue diagnosis.  He would be willing to do this as well as undergo another round of SBRT if indicated.  Discussed  the pros, cons, risk and benefits with him today.  He agrees and wants to proceed  We reviewed your CT scan of the chest and your PET scan today. We will work on arranging a navigational bronchoscopy to evaluate an enlarging left lower lobe pulmonary nodule.  This will be done as an outpatient at [facility] endoscopy under general anesthesia.  You will need a designated driver.  Please stop your aspirin 2 days prior.  We will try to get this set up for 11/27/2022. Follow Dr. Lamonte Sakai in 1 month or next available  COPD (chronic obstructive pulmonary disease) ([place]) Continue your Symbicort 2 puffs twice a day.  Remember to rinse gargle after using. Keep albuterol available to use 2 puffs up to every 4 hours if needed for shortness of breath, chest tightness, wheezing.   Allergic rhinitis Try starting loratadine 10 mg (generic Claritin) once daily during the allergy season Try starting fluticasone nasal spray, 2 sprays each nostril once daily during the allergy season (generic Flonase)   Baltazar Apo, MD, PhD 11/24/2022, 11:04 AM [facility] [phone] or if no answer before 7:00PM call [phone] For any issues after 7:00PM please call eLink [phone]

## 2022-11-24 NOTE — Assessment & Plan Note (Signed)
Continue your Symbicort 2 puffs twice a day.  Remember to rinse gargle after using. Keep albuterol available to use 2 puffs up to every 4 hours if needed for shortness of breath, chest tightness, wheezing.

## 2022-11-24 NOTE — Assessment & Plan Note (Signed)
Try starting loratadine 10 mg (generic Claritin) once daily during the allergy season Try starting fluticasone nasal spray, 2 sprays each nostril once daily during the allergy season (generic Flonase)

## 2022-11-24 NOTE — Assessment & Plan Note (Signed)
Slowly enlarging left lower lobe pulmonary nodule on his surveillance imaging.  Discussed navigational bronchoscopy with him to achieve a tissue diagnosis.  He would be willing to do this as well as undergo another round of SBRT if indicated.  Discussed the pros, cons, risk and benefits with him today.  He agrees and wants to proceed  We reviewed your CT scan of the chest and your PET scan today. We will work on arranging a navigational bronchoscopy to evaluate an enlarging left lower lobe pulmonary nodule.  This will be done as an outpatient at [facility] endoscopy under general anesthesia.  You will need a designated driver.  Please stop your aspirin 2 days prior.  We will try to get this set up for 11/27/2022. Follow Dr. Lamonte Sakai in 1 month or next available

## 2022-11-24 NOTE — Progress Notes (Signed)
Mr. Christopher Rocha denies chest pain or shortness of breath. Patient states that when his stomach hurts, he gets short of breath.  hospitalPatient denies having any s/s of Covid in his household, also denies any known exposure to Covid. MR. Christopher Rocha denies any s/s of upper or lower respiratory infection in the past 8 weeks. Mr. Christopher Rocha's PCP is Dr. Aris Everts, Cardiologist is Dr. Tiney Rouge.

## 2022-11-24 NOTE — Progress Notes (Signed)
Subjective:    Patient ID: Christopher Rocha, male    DOB: 03/26/1941, 82 y.o.   MRN: [id number]  HPI 82 year old gentleman (hx of 35 + pk/yrs) history of of COPD, CAD/CABG, hypertension with diastolic CHF, atrial fibrillation, severe AS post TAVR.  He has a history of stage IIA right upper lobe adenocarcinoma, followed by Dr. Sondra Come in radiation oncology.  He is here for an abnormal CT scan of the chest. He is now off Eliquis. Had been seen most recently by Dr Geraldo Pitter, wants to see Kathlene November at Cardiology.   CT chest 11/07/2021 reviewed by me, shows stable treated right upper lobe lesion, an enlarging and increasingly solid right lower lobe pulmonary nodule suspicious for adenocarcinoma, now 5.7 x 3.8 cm with a 2.2 cm solid component.    PET scan performed on 11/21/2021 reviewed by me shows low-level FDG activity in the right lower lobe pulmonary nodule.  No evidence of hypermetabolic metastatic disease or adenopathy.   ROV 11/24/22 --82 year old man with history of former tobacco, COPD, CAD/CABG, hypertension and diastolic dysfunction, atrial fibrillation, severe AS with TAVR.  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  I saw him for an enlarging increasingly solid right lower lobe pulmonary nodule 5.7 x 3.8 cm with a 2.2 cm solid component.  This prompted robotic navigational bronchoscopy 02/13/2022 that showed adenocarcinoma.  He was treated again with SBRT by Dr. Sondra Come.  Subsequent surveillance imaging has shown left lower lobe nodule that is increasing in size.  He had a PET scan on 11/09/2022.   PET scan 11/09/2022 shows 1 cm left lower lobe pulmonary nodule with an SUV max of 2.3.  That scan also shows known right lower lobe airspace opacity with SUV max 4.2, stable appearance of the right upper lobe consolidative region with homogeneous low-grade activity (both areas that were previously treated)   Review of Systems As per HPI  Past  Medical History:  Diagnosis Date   Acute on chronic diastolic heart failure ([place]) 07/02/2018   Adenocarcinoma, lung, right ([place]) 10/27/2019   Anemia    iron deficiency    CAD (coronary artery disease) 03/25/2018   COPD (chronic obstructive pulmonary disease) ([place]) 06/14/2018   05/2018 FEV1 60%, DLCO 53%   Coronary artery disease    Essential hypertension 03/25/2018   History of radiation therapy 06/10/2019   right lung 06/03/2019-06/10/2019  Dr Gery Pray   History of radiation therapy    Right Lung- 03/09/22-03/16/22- Dr.James Kinard   HLD (hyperlipidemia)    Hx of CABG 03/25/2018   Iron deficiency anemia 01/12/2021   Mixed dyslipidemia 03/25/2018   Pulmonary nodule    S/P CABG (coronary artery bypass graft)    S/P TAVR (transcatheter aortic valve replacement)    Edwards Sapien XT THV (size 29 mm, model # 9300TFX, serial # [id number])   Severe aortic stenosis    Unspecified atrial fibrillation ([place]) 08/19/2020     Family History  Problem Relation Age of Onset   Diabetes Mother    Clotting disorder Father      Social History   Socioeconomic History   Marital status: Widowed    Spouse name: Not on file   Number of children: Not on file   Years of education: Not on file   Highest education level: Not on file  Occupational History   Not on file  Tobacco Use   Smoking status: Former    Packs/day: 2  Types: Cigarettes    Quit date: 02/16/1988    Years since quitting: 34.7   Smokeless tobacco: Never  Vaping Use   Vaping Use: Never used  Substance and Sexual Activity   Alcohol use: Not Currently   Drug use: Never   Sexual activity: Not on file  Other Topics Concern   Not on file  Social History Narrative   Not on file   Social Determinants of Health   Financial Resource Strain: Not on file  Food Insecurity: Not on file  Transportation Needs: Not on file  Physical Activity: Not on file  Stress: Not on file  Social Connections: Not on file  Intimate Partner  Violence: Not on file     Allergies  Allergen Reactions   Penicillins Hives and Rash    Did it involve swelling of the face/tongue/throat, SOB, or low BP? No Did it involve sudden or severe rash/hives, skin peeling, or any reaction on the inside of your mouth or nose? No Did you need to seek medical attention at a hospital or doctor's office? No When did it last happen?      10-15 years If all above answers are "NO", may proceed with cephalosporin use.       Outpatient Medications Prior to Visit  Medication Sig Dispense Refill   acetaminophen (TYLENOL) 500 MG tablet Take 500 mg by mouth every 8 (eight) hours as needed (pain).     aspirin EC 81 MG tablet Take 81 mg by mouth every other day.      budesonide-formoterol (SYMBICORT) 160-4.5 MCG/ACT inhaler Inhale 2 puffs into the lungs 2 (two) times daily. 1 Inhaler 5   ibuprofen (ADVIL) 200 MG tablet Take 200 mg by mouth every 6 (six) hours as needed for mild pain.     losartan-hydrochlorothiazide (HYZAAR) 100-12.5 MG tablet Take 1 tablet by mouth daily.     Naphazoline-Glycerin (REDNESS RELIEF OP) Place 1 drop into both eyes daily as needed (for redness/itching).     PROAIR HFA 108 (90 Base) MCG/ACT inhaler Inhale 2 puffs into the lungs every 4 (four) hours as needed for shortness of breath.     rosuvastatin (CRESTOR) 10 MG tablet Take 1 tablet by mouth once daily (Patient not taking: Reported on 10/23/2022) 90 tablet 2   triamcinolone (KENALOG) 0.1 % Apply 1 application. topically 3 (three) times daily as needed (itching).     No facility-administered medications prior to visit.         Objective:   Physical Exam  Vitals:   11/24/22 1018  BP: (!) 180/88  Pulse: 71  Temp: 97.6 F (36.4 C)  TempSrc: Oral  SpO2: 97%  Weight: 201 lb 9.6 oz (91.4 kg)  Height: 5\' 11"  (1.803 m)   Gen: Pleasant, well-nourished, in no distress,  normal affect  ENT: No lesions,  mouth clear,  oropharynx clear, no postnasal drip  Neck: No JVD, no  stridor  Lungs: No use of accessory muscles, distant, no crackles or wheezing on normal respiration, no wheeze on forced expiration  Cardiovascular: RRR, 3/6 syst M  Musculoskeletal: No deformities, no cyanosis or clubbing  Neuro: alert, awake, non focal  Skin: Warm, no lesions or rash      Assessment & Plan:  Pulmonary nodule Slowly enlarging left lower lobe pulmonary nodule on his surveillance imaging.  Discussed navigational bronchoscopy with him to achieve a tissue diagnosis.  He would be willing to do this as well as undergo another round of SBRT if indicated.  Discussed  the pros, cons, risk and benefits with him today.  He agrees and wants to proceed  We reviewed your CT scan of the chest and your PET scan today. We will work on arranging a navigational bronchoscopy to evaluate an enlarging left lower lobe pulmonary nodule.  This will be done as an outpatient at [facility] endoscopy under general anesthesia.  You will need a designated driver.  Please stop your aspirin 2 days prior.  We will try to get this set up for 11/27/2022. Follow Dr. Lamonte Sakai in 1 month or next available  COPD (chronic obstructive pulmonary disease) ([place]) Continue your Symbicort 2 puffs twice a day.  Remember to rinse gargle after using. Keep albuterol available to use 2 puffs up to every 4 hours if needed for shortness of breath, chest tightness, wheezing.   Allergic rhinitis Try starting loratadine 10 mg (generic Claritin) once daily during the allergy season Try starting fluticasone nasal spray, 2 sprays each nostril once daily during the allergy season (generic Flonase)   Baltazar Apo, MD, PhD 11/24/2022, 11:04 AM [facility] [phone] or if no answer before 7:00PM call [phone] For any issues after 7:00PM please call eLink [phone]

## 2022-11-26 LAB — NOVEL CORONAVIRUS, NAA: SARS-CoV-2, NAA: NOT DETECTED

## 2022-11-27 ENCOUNTER — Other Ambulatory Visit: Payer: Self-pay

## 2022-11-27 ENCOUNTER — Ambulatory Visit (HOSPITAL_COMMUNITY): Payer: Medicare Other | Admitting: Anesthesiology

## 2022-11-27 ENCOUNTER — Encounter (HOSPITAL_COMMUNITY): Payer: Self-pay | Admitting: Emergency Medicine

## 2022-11-27 ENCOUNTER — Ambulatory Visit (HOSPITAL_COMMUNITY): Payer: Medicare Other

## 2022-11-27 ENCOUNTER — Encounter (HOSPITAL_COMMUNITY)
Admission: RE | Disposition: A | Discharge status: Home / Self Care | Payer: Self-pay | Source: Home / Self Care | Attending: Emergency Medicine

## 2022-11-27 ENCOUNTER — Ambulatory Visit (HOSPITAL_COMMUNITY)
Admission: RE | Admit: 2022-11-27 | Discharge: 2022-11-27 | Disposition: A | Discharge status: Home / Self Care | Payer: Medicare Other | Attending: Emergency Medicine | Admitting: Emergency Medicine

## 2022-11-27 ENCOUNTER — Ambulatory Visit (HOSPITAL_BASED_OUTPATIENT_CLINIC_OR_DEPARTMENT_OTHER): Payer: Medicare Other | Admitting: Anesthesiology

## 2022-11-27 DIAGNOSIS — Z87891 Personal history of nicotine dependence: Secondary | ICD-10-CM | POA: Diagnosis not present

## 2022-11-27 DIAGNOSIS — Z09 Encounter for follow-up examination after completed treatment for conditions other than malignant neoplasm: Secondary | ICD-10-CM | POA: Insufficient documentation

## 2022-11-27 DIAGNOSIS — C3490 Malignant neoplasm of unspecified part of unspecified bronchus or lung: Secondary | ICD-10-CM | POA: Diagnosis not present

## 2022-11-27 DIAGNOSIS — I110 Hypertensive heart disease with heart failure: Secondary | ICD-10-CM | POA: Diagnosis not present

## 2022-11-27 DIAGNOSIS — I4891 Unspecified atrial fibrillation: Secondary | ICD-10-CM | POA: Diagnosis not present

## 2022-11-27 DIAGNOSIS — Z7951 Long term (current) use of inhaled steroids: Secondary | ICD-10-CM | POA: Insufficient documentation

## 2022-11-27 DIAGNOSIS — J449 Chronic obstructive pulmonary disease, unspecified: Secondary | ICD-10-CM

## 2022-11-27 DIAGNOSIS — R911 Solitary pulmonary nodule: Secondary | ICD-10-CM | POA: Diagnosis present

## 2022-11-27 DIAGNOSIS — I2510 Atherosclerotic heart disease of native coronary artery without angina pectoris: Secondary | ICD-10-CM | POA: Diagnosis not present

## 2022-11-27 DIAGNOSIS — Z951 Presence of aortocoronary bypass graft: Secondary | ICD-10-CM | POA: Diagnosis not present

## 2022-11-27 DIAGNOSIS — I5030 Unspecified diastolic (congestive) heart failure: Secondary | ICD-10-CM | POA: Insufficient documentation

## 2022-11-27 DIAGNOSIS — I509 Heart failure, unspecified: Secondary | ICD-10-CM | POA: Diagnosis not present

## 2022-11-27 DIAGNOSIS — R918 Other nonspecific abnormal finding of lung field: Secondary | ICD-10-CM | POA: Diagnosis not present

## 2022-11-27 HISTORY — DX: Dyspnea, unspecified: R06.00

## 2022-11-27 HISTORY — PX: FIDUCIAL MARKER PLACEMENT: SHX6858

## 2022-11-27 HISTORY — PX: BRONCHIAL BIOPSY: SHX5109

## 2022-11-27 HISTORY — PX: BRONCHIAL BRUSHINGS: SHX5108

## 2022-11-27 HISTORY — PX: BRONCHIAL WASHINGS: SHX5105

## 2022-11-27 HISTORY — PX: BRONCHIAL NEEDLE ASPIRATION BIOPSY: SHX5106

## 2022-11-27 LAB — CBC
HCT: 40.6 % (ref 39.0–52.0)
Hemoglobin: 13.2 g/dL (ref 13.0–17.0)
MCH: 29.9 pg (ref 26.0–34.0)
MCHC: 32.5 g/dL (ref 30.0–36.0)
MCV: 92.1 fL (ref 80.0–100.0)
Platelets: 201 10*3/uL (ref 150–400)
RBC: 4.41 MIL/uL (ref 4.22–5.81)
RDW: 13.9 % (ref 11.5–15.5)
WBC: 14.5 10*3/uL — ABNORMAL HIGH (ref 4.0–10.5)
nRBC: 0 % (ref 0.0–0.2)

## 2022-11-27 LAB — BASIC METABOLIC PANEL
Anion gap: 8 (ref 5–15)
BUN: 15 mg/dL (ref 8–23)
CO2: 26 mmol/L (ref 22–32)
Calcium: 9.3 mg/dL (ref 8.9–10.3)
Chloride: 100 mmol/L (ref 98–111)
Creatinine, Ser: 1.09 mg/dL (ref 0.61–1.24)
GFR, Estimated: >60 mL/min (ref >60)
Glucose, Bld: 138 mg/dL — ABNORMAL HIGH (ref 70–99)
Potassium: 4 mmol/L (ref 3.5–5.1)
Sodium: 134 mmol/L — ABNORMAL LOW (ref 135–145)

## 2022-11-27 SURGERY — BRONCHOSCOPY, WITH BIOPSY USING ELECTROMAGNETIC NAVIGATION
Anesthesia: General

## 2022-11-27 MED ORDER — LIDOCAINE 2% (20 MG/ML) 5 ML SYRINGE
INTRAMUSCULAR | Status: DC | PRN
  Administered 2022-11-27: 60 mg via INTRAVENOUS

## 2022-11-27 MED ORDER — PROPOFOL 10 MG/ML IV BOLUS
INTRAVENOUS | Status: DC | PRN
  Administered 2022-11-27: 120 mg via INTRAVENOUS
  Administered 2022-11-27: 30 mg via INTRAVENOUS

## 2022-11-27 MED ORDER — CHLORHEXIDINE GLUCONATE 0.12 % MT SOLN
15.0000 mL | Freq: Once | OROMUCOSAL | Status: AC
  Administered 2022-11-27: 15 mL via OROMUCOSAL

## 2022-11-27 MED ORDER — ONDANSETRON HCL 4 MG/2ML IJ SOLN
INTRAMUSCULAR | Status: DC | PRN
  Administered 2022-11-27: 4 mg via INTRAVENOUS

## 2022-11-27 MED ORDER — FENTANYL CITRATE (PF) 100 MCG/2ML IJ SOLN: 25.0000 ug | INTRAMUSCULAR | Status: DC | PRN

## 2022-11-27 MED ORDER — ONDANSETRON HCL 4 MG/2ML IJ SOLN: 4.0000 mg | Freq: Once | INTRAMUSCULAR | Status: DC | PRN

## 2022-11-27 MED ORDER — PHENYLEPHRINE 80 MCG/ML (10ML) SYRINGE FOR IV PUSH (FOR BLOOD PRESSURE SUPPORT)
PREFILLED_SYRINGE | INTRAVENOUS | Status: DC | PRN
  Administered 2022-11-27: 80 ug via INTRAVENOUS

## 2022-11-27 MED ORDER — ROCURONIUM BROMIDE 10 MG/ML (PF) SYRINGE
PREFILLED_SYRINGE | INTRAVENOUS | Status: DC | PRN
  Administered 2022-11-27: 60 mg via INTRAVENOUS
  Administered 2022-11-27 (×2): 10 mg via INTRAVENOUS

## 2022-11-27 MED ORDER — EPHEDRINE SULFATE-NACL 50-0.9 MG/10ML-% IV SOSY
PREFILLED_SYRINGE | INTRAVENOUS | Status: DC | PRN
  Administered 2022-11-27 (×2): 5 mg via INTRAVENOUS

## 2022-11-27 MED ORDER — DEXAMETHASONE SODIUM PHOSPHATE 10 MG/ML IJ SOLN
INTRAMUSCULAR | Status: DC | PRN
  Administered 2022-11-27: 5 mg via INTRAVENOUS

## 2022-11-27 MED ORDER — LACTATED RINGERS IV SOLN: INTRAVENOUS | Status: DC

## 2022-11-27 MED ORDER — FENTANYL CITRATE (PF) 100 MCG/2ML IJ SOLN
INTRAMUSCULAR | Status: DC | PRN
  Administered 2022-11-27: 50 ug via INTRAVENOUS

## 2022-11-27 MED ORDER — SUGAMMADEX SODIUM 200 MG/2ML IV SOLN
INTRAVENOUS | Status: DC | PRN
  Administered 2022-11-27: 200 mg via INTRAVENOUS

## 2022-11-27 SURGICAL SUPPLY — 1 items: fiducial IMPLANT

## 2022-11-27 NOTE — Anesthesia Procedure Notes (Signed)
Procedure Name: Intubation Date/Time: 11/27/2022 7:37 AM  Performed by: Barrington Ellison, CRNAPre-anesthesia Checklist: Patient identified, Emergency Drugs available, Suction available and Patient being monitored Patient Re-evaluated:Patient Re-evaluated prior to induction Oxygen Delivery Method: Circle System Utilized Preoxygenation: Pre-oxygenation with 100% oxygen Induction Type: IV induction Ventilation: Mask ventilation without difficulty Laryngoscope Size: Mac and 4 Grade View: Grade I Tube type: Oral Tube size: 8.5 mm Number of attempts: 1 Airway Equipment and Method: Stylet and Oral airway Placement Confirmation: ETT inserted through vocal cords under direct vision, positive ETCO2 and breath sounds checked- equal and bilateral Secured at: 23 cm Tube secured with: Tape Dental Injury: Teeth and Oropharynx as per pre-operative assessment

## 2022-11-27 NOTE — Op Note (Signed)
Video Bronchoscopy with Robotic Assisted Bronchoscopic Navigation   Date of Operation: 11/27/2022   Pre-op Diagnosis: Left lower lobe pulmonary nodule  Post-op Diagnosis: Same  Surgeon: Baltazar Apo  Assistants: None  Anesthesia: General endotracheal anesthesia  Operation: Flexible video fiberoptic bronchoscopy with robotic assistance and biopsies.  Estimated Blood Loss: Minimal  Complications: None  Indications and History: Christopher Rocha is a 82 y.o. male with history of non-small cell lung cancer treated with SBRT.  He has a new left lower lobe pulmonary nodule on surveillance imaging suspicious for primary malignancy.  Recommendation made to achieve a tissue diagnosis via robotic assisted navigational bronchoscopy. The risks, benefits, complications, treatment options and expected outcomes were discussed with the patient.  The possibilities of pneumothorax, pneumonia, reaction to medication, pulmonary aspiration, perforation of a viscus, bleeding, failure to diagnose a condition and creating a complication requiring transfusion or operation were discussed with the patient who freely signed the consent.    Description of Procedure: The patient was seen in the Preoperative Area, was examined and was deemed appropriate to proceed.  The patient was taken to [facility] endoscopy room 3, identified as Christopher Rocha and the procedure verified as Flexible Video Fiberoptic Bronchoscopy.  A Time Out was held and the above information confirmed.   Prior to the date of the procedure a high-resolution CT scan of the chest was performed. Utilizing ION software program a virtual tracheobronchial tree was generated to allow the creation of distinct navigation pathways to the patient's parenchymal abnormalities. After being taken to the operating room general anesthesia was initiated and the patient  was orally intubated. The video fiberoptic bronchoscope was introduced via the endotracheal tube and  a general inspection was performed which showed normal right and left lung anatomy. Aspiration of the bilateral mainstems was completed to remove any remaining secretions. Robotic catheter inserted into patient's endotracheal tube.   Target #1 left lower lobe pulmonary nodule: The distinct navigation pathways prepared prior to this procedure were then utilized to navigate to patient's lesion identified on CT scan. The robotic catheter was secured into place and the vision probe was withdrawn.  Lesion location was approximated using fluoroscopy.  Local registration and targeting was performed using Cios three-dimensional imaging.  Needle-in-lesion was established by Cios. Under fluoroscopic guidance transbronchial needle brushings, transbronchial needle biopsies, and transbronchial forceps biopsies were performed to be sent for cytology and pathology.  Under fluoroscopic guidance a single fiducial marker was placed adjacent to the nodule A bronchioalveolar lavage was performed in the left lower lobe and sent for cytology.  At the end of the procedure a general airway inspection was performed and there was no evidence of active bleeding. The bronchoscope was removed.  The patient tolerated the procedure well. There was no significant blood loss and there were no obvious complications. A post-procedural chest x-ray is pending.  Samples Target #1: 1. Transbronchial needle brushings from left lower lobe pulmonary nodule 2. Transbronchial Wang needle biopsies from left lower lobe pulmonary nodule 3. Transbronchial forceps biopsies from left lower lobe pulmonary nodule 4. Bronchoalveolar lavage from left lower lobe   Plans:  The patient will be discharged from the PACU to home when recovered from anesthesia and after chest x-ray is reviewed. We will review the cytology, pathology and microbiology results with the patient when they become available. Outpatient followup will be with Dr. Lamonte Sakai and Dr.  Sondra Come.   Baltazar Apo, MD, PhD 11/27/2022, 8:54 AM [facility] [phone] or if no  answer before 7:00PM call [phone] For any issues after 7:00PM please call eLink [phone]

## 2022-11-27 NOTE — Transfer of Care (Signed)
Immediate Anesthesia Transfer of Care Note  Patient: Christopher Rocha [facility]  Procedure(s) Performed: ROBOTIC ASSISTED NAVIGATIONAL BRONCHOSCOPY BRONCHIAL NEEDLE ASPIRATION BIOPSIES BRONCHIAL BIOPSIES BRONCHIAL BRUSHINGS FIDUCIAL MARKER PLACEMENT BRONCHIAL WASHINGS  Patient Location: PACU  Anesthesia Type:General  Level of Consciousness: awake, alert , and oriented  Airway & Oxygen Therapy: Patient Spontanous Breathing  Post-op Assessment: Report given to RN  Post vital signs: Reviewed and stable  Last Vitals:  Vitals Value Taken Time  BP 140/65 11/27/22 0858  Temp    Pulse 76 11/27/22 0900  Resp 21 11/27/22 0900  SpO2 93 % 11/27/22 0900  Vitals shown include unvalidated device data.  Last Pain:  Vitals:   11/27/22 0610  TempSrc:   PainSc: 1          Complications: No notable events documented.

## 2022-11-27 NOTE — Anesthesia Preprocedure Evaluation (Addendum)
Anesthesia Evaluation  Patient identified by MRN, date of birth, ID band Patient awake    Reviewed: Allergy & Precautions, H&P , NPO status , Patient's Chart, lab work & pertinent test results  Airway Mallampati: III  TM Distance: >3 FB Neck ROM: Full    Dental  (+) Edentulous Upper, Edentulous Lower   Pulmonary shortness of breath and with exertion, COPD (chronic clear cough),  COPD inhaler, former smoker Quit smoking 1989   Pulmonary exam normal breath sounds clear to auscultation       Cardiovascular METS (works in yard regularly, denies CP or SOB): 3 - Mets hypertension (219/91 preop), Pt. on medications + CAD, + CABG (2011) and +CHF (mildly reduced RV fx)  Normal cardiovascular exam+ dysrhythmias Atrial Fibrillation + Valvular Problems/Murmurs (mild AS s/p TAVR 2019) AS  Rhythm:Regular Rate:Normal  Echo 12/2021  1. Frequent PVC's. Left ventricular ejection fraction, by estimation, is  50 to 55%. The left ventricle has low normal function. Left ventricular  endocardial border not optimally defined to evaluate regional wall motion.  There is mild concentric left  ventricular hypertrophy. Left ventricular diastolic parameters are  indeterminate.   2. Right ventricular systolic function is mildly reduced. The right  ventricular size is normal.   3. The mitral valve is degenerative. No evidence of mitral valve  regurgitation. No evidence of mitral stenosis.   4. The aortic valve was not well visualized. Aortic valve regurgitation  is not visualized. Mild aortic valve stenosis.    Only takes BP meds PRN because they make him feel "funny," but monitors BP at home and states it is usually 114-120SBP. Pt and daughter state that his BP is always high in medical settings.     Neuro/Psych negative neurological ROS  negative psych ROS   GI/Hepatic negative GI ROS, Neg liver ROS,,,  Endo/Other  negative endocrine ROS     Renal/GU negative Renal ROS  negative genitourinary   Musculoskeletal negative musculoskeletal ROS (+)    Abdominal   Peds negative pediatric ROS (+)  Hematology negative hematology ROS (+) Hb 13.2   Anesthesia Other Findings   Reproductive/Obstetrics negative OB ROS                             Anesthesia Physical Anesthesia Plan  ASA: 4  Anesthesia Plan: General   Post-op Pain Management: Tylenol PO (pre-op)*   Induction: Intravenous  PONV Risk Score and Plan: 2 and Ondansetron, Dexamethasone and Treatment may vary due to age or medical condition  Airway Management Planned: Oral ETT  Additional Equipment: None  Intra-op Plan:   Post-operative Plan: Extubation in OR  Informed Consent: I have reviewed the patients History and Physical, chart, labs and discussed the procedure including the risks, benefits and alternatives for the proposed anesthesia with the patient or authorized representative who has indicated his/her understanding and acceptance.     Dental advisory given  Plan Discussed with: CRNA  Anesthesia Plan Comments: (I had long conversation with patient about the pharmacodynamics of antihypertensives and suggested getting on a better regimen, including potentially trying to take BP meds in the evening instead of the morning. Advised him to take BP meds the night before any future anesthetics to prevent this situation from happening again. In preop he is consistently >210SBP. However given the urgency of the procedure and need for diagnosis of potential cancer, we will proceed. Advised patient he is high risk of complications.)  Anesthesia Quick Evaluation  

## 2022-11-27 NOTE — Interval H&P Note (Signed)
History and Physical Interval Note:  11/27/2022 7:22 AM  Christopher Rocha  has presented today for surgery, with the diagnosis of LEFT LOWER LOBE PULMONARY NODULE.  The various methods of treatment have been discussed with the patient and family. After consideration of risks, benefits and other options for treatment, the patient has consented to  Procedure(s): ROBOTIC ASSISTED NAVIGATIONAL BRONCHOSCOPY (N/A) as a surgical intervention.  The patient's history has been reviewed, patient examined.  He is hypertensive, systolics [id number].  States that he takes his blood pressure medication at home only as needed, usually has SBP 150s.  When he does take it he takes it in the evening.  If he needs treatment here preoperatively we will do so.  Otherwise no change in status, stable for surgery.  I have reviewed the patient's chart and labs.  Questions were answered to the patient's satisfaction.     [name]

## 2022-11-27 NOTE — Discharge Instructions (Signed)
Flexible Bronchoscopy, Care After This sheet gives you information about how to care for yourself after your test. Your doctor may also give you more specific instructions. If you have problems or questions, contact your doctor. Follow these instructions at home: Eating and drinking When your numbness is gone and your cough and gag reflexes have come back, you may: Eat only soft foods. Slowly drink liquids. The day after the test, go back to your normal diet. Driving Do not drive for 24 hours if you were given a medicine to help you relax (sedative). Do not drive or use heavy machinery while taking prescription pain medicine. General instructions  Take over-the-counter and prescription medicines only as told by your doctor. Return to your normal activities as told. Ask what activities are safe for you. Do not use any products that have nicotine or tobacco in them. This includes cigarettes and e-cigarettes. If you need help quitting, ask your doctor. Keep all follow-up visits as told by your doctor. This is important. It is very important if you had a tissue sample (biopsy) taken. Get help right away if: You have shortness of breath that gets worse. You get light-headed. You feel like you are going to pass out (faint). You have chest pain. You cough up: More than a little blood. More blood than before. Summary Do not eat or drink anything (not even water) for 2 hours after your test, or until your numbing medicine wears off. Do not use cigarettes. Do not use e-cigarettes. Get help right away if you have chest pain.  Please call our office for any questions or concerns.  [phone].  This information is not intended to replace advice given to you by your health care provider. Make sure you discuss any questions you have with your health care provider. Document Released: 06/18/2009 Document Revised: 08/03/2017 Document Reviewed: 09/08/2016 Elsevier Patient Education  2020 Elsevier  Inc.  

## 2022-11-27 NOTE — Anesthesia Postprocedure Evaluation (Signed)
Anesthesia Post Note  Patient: Christopher Rocha [facility]  Procedure(s) Performed: ROBOTIC ASSISTED NAVIGATIONAL BRONCHOSCOPY BRONCHIAL NEEDLE ASPIRATION BIOPSIES BRONCHIAL BIOPSIES BRONCHIAL BRUSHINGS FIDUCIAL MARKER PLACEMENT BRONCHIAL WASHINGS     Patient location during evaluation: PACU Anesthesia Type: General Level of consciousness: awake and alert, oriented and patient cooperative Pain management: pain level controlled Vital Signs Assessment: post-procedure vital signs reviewed and stable Respiratory status: spontaneous breathing, nonlabored ventilation and respiratory function stable Cardiovascular status: blood pressure returned to baseline and stable Postop Assessment: no apparent nausea or vomiting Anesthetic complications: no   No notable events documented.  Last Vitals:  Vitals:   11/27/22 0915 11/27/22 0930  BP: (!) 164/70 (!) 175/67  Pulse: 71 66  Resp: (!) 23 (!) 23  Temp:  36.6 C  SpO2: 97% 99%    Last Pain:  Vitals:   11/27/22 0930  TempSrc:   PainSc: 0-No pain                 Pervis Hocking

## 2022-11-28 ENCOUNTER — Encounter (HOSPITAL_COMMUNITY): Payer: Self-pay | Admitting: Emergency Medicine

## 2022-11-29 LAB — CYTOLOGY - NON PAP

## 2022-12-01 ENCOUNTER — Telehealth: Payer: Self-pay | Admitting: Emergency Medicine

## 2022-12-01 LAB — CYTOLOGY - NON PAP

## 2022-12-01 NOTE — Telephone Encounter (Signed)
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 He wants to review with Dr Sondra Come to consider SBRT to the LLL nodule.   Will send this FYI to Dr Sondra Come so he can get set up

## 2022-12-04 ENCOUNTER — Telehealth: Payer: Self-pay

## 2022-12-04 NOTE — Telephone Encounter (Signed)
Daughter called in to make Dr. Sondra Come aware that patient is having issues with his Gallbladder. Per Daughter patient wants to address gallbladder issue prior to starting radiation treatment to lung nodule. Daughter to follow up with update after patients appointment with PCP on 12/05/22.

## 2022-12-05 DIAGNOSIS — R1011 Right upper quadrant pain: Secondary | ICD-10-CM | POA: Diagnosis not present

## 2022-12-12 DIAGNOSIS — R109 Unspecified abdominal pain: Secondary | ICD-10-CM | POA: Diagnosis not present

## 2022-12-12 DIAGNOSIS — R1011 Right upper quadrant pain: Secondary | ICD-10-CM | POA: Diagnosis not present

## 2022-12-13 ENCOUNTER — Telehealth: Payer: Self-pay | Admitting: Radiation Oncology

## 2022-12-13 NOTE — Telephone Encounter (Signed)
Patient's daughter called and stated they would like to proceed with radiation treatment after talking with Dr. Delton Coombes. Sent message to provider and team.

## 2022-12-14 DIAGNOSIS — C3432 Malignant neoplasm of lower lobe, left bronchus or lung: Secondary | ICD-10-CM | POA: Diagnosis not present

## 2022-12-19 DIAGNOSIS — C3491 Malignant neoplasm of unspecified part of right bronchus or lung: Secondary | ICD-10-CM | POA: Diagnosis not present

## 2022-12-19 DIAGNOSIS — J449 Chronic obstructive pulmonary disease, unspecified: Secondary | ICD-10-CM | POA: Diagnosis not present

## 2022-12-19 DIAGNOSIS — R110 Nausea: Secondary | ICD-10-CM | POA: Diagnosis not present

## 2022-12-19 DIAGNOSIS — R1011 Right upper quadrant pain: Secondary | ICD-10-CM | POA: Diagnosis not present

## 2022-12-19 DIAGNOSIS — I10 Essential (primary) hypertension: Secondary | ICD-10-CM | POA: Diagnosis not present

## 2022-12-20 ENCOUNTER — Other Ambulatory Visit: Payer: Self-pay

## 2022-12-20 ENCOUNTER — Ambulatory Visit: Admission: RE | Admit: 2022-12-20 | Payer: Medicare Other | Source: Ambulatory Visit | Admitting: Radiation Oncology

## 2022-12-22 DIAGNOSIS — R1011 Right upper quadrant pain: Secondary | ICD-10-CM | POA: Diagnosis not present

## 2022-12-22 DIAGNOSIS — R1013 Epigastric pain: Secondary | ICD-10-CM | POA: Diagnosis not present

## 2022-12-22 DIAGNOSIS — R109 Unspecified abdominal pain: Secondary | ICD-10-CM | POA: Diagnosis not present

## 2022-12-26 DIAGNOSIS — I10 Essential (primary) hypertension: Secondary | ICD-10-CM | POA: Diagnosis not present

## 2022-12-26 DIAGNOSIS — R1011 Right upper quadrant pain: Secondary | ICD-10-CM | POA: Diagnosis not present

## 2022-12-26 DIAGNOSIS — R110 Nausea: Secondary | ICD-10-CM | POA: Diagnosis not present

## 2022-12-26 DIAGNOSIS — K440 Diaphragmatic hernia with obstruction, without gangrene: Secondary | ICD-10-CM | POA: Diagnosis not present

## 2022-12-26 DIAGNOSIS — C3491 Malignant neoplasm of unspecified part of right bronchus or lung: Secondary | ICD-10-CM | POA: Diagnosis not present

## 2022-12-26 DIAGNOSIS — J449 Chronic obstructive pulmonary disease, unspecified: Secondary | ICD-10-CM | POA: Diagnosis not present

## 2022-12-27 ENCOUNTER — Ambulatory Visit: Payer: Medicare Other | Admitting: Emergency Medicine

## 2022-12-27 ENCOUNTER — Encounter: Payer: Self-pay | Admitting: Emergency Medicine

## 2022-12-27 VITALS — BP 134/76 | HR 67 | Temp 98.7°F | Ht 71.0 in | Wt 185.2 lb

## 2022-12-27 DIAGNOSIS — J411 Mucopurulent chronic bronchitis: Secondary | ICD-10-CM

## 2022-12-27 DIAGNOSIS — C3492 Malignant neoplasm of unspecified part of left bronchus or lung: Secondary | ICD-10-CM

## 2022-12-27 HISTORY — DX: Malignant neoplasm of unspecified part of left bronchus or lung: C34.92

## 2022-12-27 MED ORDER — ATROVENT HFA 17 MCG/ACT IN AERS: 2.0000 | INHALATION_SPRAY | RESPIRATORY_TRACT | 12 refills | Status: DC | PRN

## 2022-12-27 NOTE — Addendum Note (Signed)
Addended by: Dorisann Frames R on: 12/27/2022 10:35 AM   Modules accepted: Orders

## 2022-12-27 NOTE — Progress Notes (Signed)
Subjective:    Patient ID: Christopher Rocha, male    DOB: 1941-04-08, 82 y.o.   MRN: [id number]  HPI  ROV 11/24/22 --82 year old man with history of former tobacco, COPD, CAD/CABG, hypertension and diastolic dysfunction, atrial fibrillation, severe AS with TAVR.  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  I saw him for an enlarging increasingly solid right lower lobe pulmonary nodule 5.7 x 3.8 cm with a 2.2 cm solid component.  This prompted robotic navigational bronchoscopy 02/13/2022 that showed adenocarcinoma.  He was treated again with SBRT by Dr. Roselind Messier.  Subsequent surveillance imaging has shown left lower lobe nodule that is increasing in size.  He had a PET scan on 11/09/2022.   PET scan 11/09/2022 shows 1 cm left lower lobe pulmonary nodule with an SUV max of 2.3.  That scan also shows known right lower lobe airspace opacity with SUV max 4.2, stable appearance of the right upper lobe consolidative region with homogeneous low-grade activity (both areas that were previously treated)   ROV 12/27/22 -- Follow-up visit for 82 year old gentleman with COPD, history of stage IIa right upper lobe adenocarcinoma (SBRT), right lower lobe adenocarcinoma (also treated SBRT).  He had an enlarging left lower lobe pulmonary nodule that we evaluated with navigational bronchoscopy on 11/27/2022, consistent with non-small cell lung cancer.  He is planning to receive SBRT for this with Dr. Roselind Messier. Had to be delayed so he could have a GB evaluation - turned out to have a hernia, did not require surgery.  Currently managed on Symbicort, never started Flonase. He reports that his abdominal swelling is better, and his breathing is as well.  He has had cough, white mucous. Happens daily. No blood.    Review of Systems As per HPI    Objective:   Physical Exam  Vitals:   12/27/22 0914  BP: 134/76  Pulse: 67  Temp: 98.7 F (37.1 C)  TempSrc: Oral  SpO2: 97%   Weight: 185 lb 3.2 oz (84 kg)  Height:  (1.803 m)   Gen: Pleasant, well-nourished, in no distress,  normal affect  ENT: No lesions,  mouth clear,  oropharynx clear, no postnasal drip  Neck: No JVD, no stridor  Lungs: No use of accessory muscles, distant, no crackles or wheezing on normal respiration, no wheeze on forced expiration  Cardiovascular: RRR, 3/6 syst M  Musculoskeletal: No deformities, no cyanosis or clubbing  Neuro: alert, awake, non focal  Skin: Warm, no lesions or rash      Assessment & Plan:  COPD (chronic obstructive pulmonary disease) (HCC) Overall minimal symptom burden.  Has been on Symbicort, questions whether he needs to stay on this.  I have asked him to stay on it for the next 6 months, assess whether he gets benefit.  If we do not feel like it is helping him then we could consider stopping it.  His biggest complaint today was cough, white mucus.  He has chronic rhinitis.  Hopefully decreasing his mucus burden will help his cough.  We will try Atrovent nasal spray.  Continue Symbicort 2 puffs twice a day.  Rinse and gargle after using. Try starting Atrovent nasal spray, 2 sprays each nostril 2-3 times daily if you need it for nasal congestion and drainage.  Hopefully this will help your cough. Talk to Dr. Tomie China about whether you need to remain on your Eliquis. 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  He is planning to start SBRT with Dr. Roselind Messier soon.   Christopher Pupa, MD, PhD 12/27/2022, 9:48 AM [facility] [phone] or if no answer before 7:00PM call [phone] For any issues after 7:00PM please call eLink [phone]

## 2022-12-27 NOTE — Patient Instructions (Addendum)
Get your radiation treatment with Dr. Roselind Messier as planned. Continue Symbicort 2 puffs twice a day.  Rinse and gargle after using. Try starting Atrovent nasal spray, 2 sprays each nostril 2-3 times daily if you need it for nasal congestion and drainage.  Hopefully this will help your cough. Talk to Dr. Tomie China about whether you need to remain on your Eliquis. Follow with Dr Delton Coombes in 6 months or sooner if you have any problems

## 2022-12-27 NOTE — Assessment & Plan Note (Signed)
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  He is planning to start SBRT with Dr. Roselind Messier soon.

## 2022-12-27 NOTE — Assessment & Plan Note (Signed)
Overall minimal symptom burden.  Has been on Symbicort, questions whether he needs to stay on this.  I have asked him to stay on it for the next 6 months, assess whether he gets benefit.  If we do not feel like it is helping him then we could consider stopping it.  His biggest complaint today was cough, white mucus.  He has chronic rhinitis.  Hopefully decreasing his mucus burden will help his cough.  We will try Atrovent nasal spray.  Continue Symbicort 2 puffs twice a day.  Rinse and gargle after using. Try starting Atrovent nasal spray, 2 sprays each nostril 2-3 times daily if you need it for nasal congestion and drainage.  Hopefully this will help your cough. Talk to Dr. Tomie China about whether you need to remain on your Eliquis. Follow with Dr Delton Coombes in 6 months or sooner if you have any problems

## 2023-01-02 ENCOUNTER — Ambulatory Visit: Payer: Medicare Other | Admitting: Radiation Oncology

## 2023-01-04 ENCOUNTER — Ambulatory Visit: Payer: Medicare Other | Admitting: Radiation Oncology

## 2023-01-09 ENCOUNTER — Ambulatory Visit
Admission: RE | Admit: 2023-01-09 | Discharge: 2023-01-09 | Disposition: A | Discharge status: Home / Self Care | Payer: Medicare Other | Source: Ambulatory Visit | Attending: Radiation Oncology | Admitting: Radiation Oncology

## 2023-01-09 ENCOUNTER — Ambulatory Visit: Payer: Medicare Other | Admitting: Radiation Oncology

## 2023-01-09 ENCOUNTER — Other Ambulatory Visit: Payer: Self-pay

## 2023-01-09 DIAGNOSIS — C3411 Malignant neoplasm of upper lobe, right bronchus or lung: Secondary | ICD-10-CM | POA: Diagnosis not present

## 2023-01-09 DIAGNOSIS — C3492 Malignant neoplasm of unspecified part of left bronchus or lung: Secondary | ICD-10-CM

## 2023-01-09 DIAGNOSIS — C3432 Malignant neoplasm of lower lobe, left bronchus or lung: Secondary | ICD-10-CM | POA: Diagnosis not present

## 2023-01-10 ENCOUNTER — Telehealth: Payer: Self-pay | Admitting: Emergency Medicine

## 2023-01-10 NOTE — Telephone Encounter (Signed)
Annabelle Harman states patient needs Atrovent nasal spray. Also needs refill for Symbicort. Pharmacy is [facility].Annabelle Harman phone number is [phone].

## 2023-01-11 DIAGNOSIS — C3411 Malignant neoplasm of upper lobe, right bronchus or lung: Secondary | ICD-10-CM | POA: Diagnosis not present

## 2023-01-11 DIAGNOSIS — C3432 Malignant neoplasm of lower lobe, left bronchus or lung: Secondary | ICD-10-CM | POA: Diagnosis not present

## 2023-01-12 MED ORDER — IPRATROPIUM BROMIDE 0.03 % NA SOLN: 2.0000 | Freq: Two times a day (BID) | NASAL | 12 refills | Status: DC

## 2023-01-12 NOTE — Telephone Encounter (Signed)
Spoke with pt who is requesting Atrovant refill only. Refill order placed. Nothing further needed at this time.

## 2023-01-23 ENCOUNTER — Other Ambulatory Visit: Payer: Self-pay

## 2023-01-23 ENCOUNTER — Ambulatory Visit
Admission: RE | Admit: 2023-01-23 | Discharge: 2023-01-23 | Disposition: A | Discharge status: Home / Self Care | Payer: Medicare Other | Source: Ambulatory Visit | Attending: Radiation Oncology | Admitting: Radiation Oncology

## 2023-01-23 DIAGNOSIS — C3411 Malignant neoplasm of upper lobe, right bronchus or lung: Secondary | ICD-10-CM | POA: Diagnosis not present

## 2023-01-23 DIAGNOSIS — C3492 Malignant neoplasm of unspecified part of left bronchus or lung: Secondary | ICD-10-CM

## 2023-01-23 LAB — RAD ONC ARIA SESSION SUMMARY
Course Elapsed Days: 0
Plan Fractions Treated to Date: 1
Plan Prescribed Dose Per Fraction: 18 Gy
Plan Total Fractions Prescribed: 3
Plan Total Prescribed Dose: 54 Gy
Reference Point Dosage Given to Date: 18 Gy
Reference Point Session Dosage Given: 18 Gy
Session Number: 1

## 2023-01-25 ENCOUNTER — Ambulatory Visit
Admission: RE | Admit: 2023-01-25 | Discharge: 2023-01-25 | Disposition: A | Discharge status: Home / Self Care | Payer: Medicare Other | Source: Ambulatory Visit | Attending: Radiation Oncology | Admitting: Radiation Oncology

## 2023-01-25 ENCOUNTER — Other Ambulatory Visit: Payer: Self-pay

## 2023-01-25 DIAGNOSIS — C3492 Malignant neoplasm of unspecified part of left bronchus or lung: Secondary | ICD-10-CM

## 2023-01-25 DIAGNOSIS — C3411 Malignant neoplasm of upper lobe, right bronchus or lung: Secondary | ICD-10-CM | POA: Diagnosis not present

## 2023-01-25 LAB — RAD ONC ARIA SESSION SUMMARY
Course Elapsed Days: 2
Plan Fractions Treated to Date: 2
Plan Prescribed Dose Per Fraction: 18 Gy
Plan Total Fractions Prescribed: 3
Plan Total Prescribed Dose: 54 Gy
Reference Point Dosage Given to Date: 36 Gy
Reference Point Session Dosage Given: 18 Gy
Session Number: 2

## 2023-01-30 ENCOUNTER — Ambulatory Visit
Admission: RE | Admit: 2023-01-30 | Discharge: 2023-01-30 | Disposition: A | Discharge status: Home / Self Care | Payer: Medicare Other | Source: Ambulatory Visit | Attending: Radiation Oncology | Admitting: Radiation Oncology

## 2023-01-30 ENCOUNTER — Other Ambulatory Visit: Payer: Self-pay

## 2023-01-30 DIAGNOSIS — C3411 Malignant neoplasm of upper lobe, right bronchus or lung: Secondary | ICD-10-CM | POA: Diagnosis not present

## 2023-01-30 DIAGNOSIS — C3492 Malignant neoplasm of unspecified part of left bronchus or lung: Secondary | ICD-10-CM

## 2023-01-30 DIAGNOSIS — C3432 Malignant neoplasm of lower lobe, left bronchus or lung: Secondary | ICD-10-CM | POA: Diagnosis not present

## 2023-01-30 DIAGNOSIS — Z510 Encounter for antineoplastic radiation therapy: Secondary | ICD-10-CM | POA: Diagnosis not present

## 2023-01-30 LAB — RAD ONC ARIA SESSION SUMMARY
Course Elapsed Days: 7
Plan Fractions Treated to Date: 3
Plan Prescribed Dose Per Fraction: 18 Gy
Plan Total Fractions Prescribed: 3
Plan Total Prescribed Dose: 54 Gy
Reference Point Dosage Given to Date: 54 Gy
Reference Point Session Dosage Given: 18 Gy
Session Number: 3

## 2023-02-02 DIAGNOSIS — J42 Unspecified chronic bronchitis: Secondary | ICD-10-CM | POA: Diagnosis not present

## 2023-02-02 DIAGNOSIS — I10 Essential (primary) hypertension: Secondary | ICD-10-CM | POA: Diagnosis not present

## 2023-02-06 NOTE — Radiation Completion Notes (Signed)
Patient Name: Christopher Rocha, Christopher Rocha MRN: [id number] Date of Birth: [birth year] Referring Physician: [name], M.D. Date of Service: 2023-02-06 Radiation Oncologist: Arnette Schaumann, M.D. [facility]                             RADIATION ONCOLOGY END OF TREATMENT NOTE     Diagnosis: C34.32 Malignant neoplasm of lower lobe, left bronchus or lung Intent: Curative     ==========DELIVERED PLANS==========  First Treatment Date: 2023-01-23 - Last Treatment Date: 2023-01-30   Plan Name: Lung_L_SBRT Site: Lung, Left Technique: SBRT/SRT-IMRT Mode: Photon Dose Per Fraction: 18 Gy Prescribed Dose (Delivered / Prescribed): 54 Gy / 54 Gy Prescribed Fxs (Delivered / Prescribed): 3 / 3     ==========ON TREATMENT VISIT DATES========== 2023-01-23, 2023-01-25, 2023-01-30, 2023-01-30     ==========UPCOMING VISITS==========       ==========APPENDIX - ON TREATMENT VISIT NOTES==========   See weekly On Treatment Notes in Epic for details.

## 2023-03-02 ENCOUNTER — Telehealth: Payer: Self-pay | Admitting: *Deleted

## 2023-03-02 ENCOUNTER — Encounter: Payer: Self-pay | Admitting: Radiation Oncology

## 2023-03-02 NOTE — Telephone Encounter (Signed)
RETURNED PATIENT'S RELATIVE'S PHONE CALL (KEISHA BRADY),. LVM FOR A RETURN CALL

## 2023-03-04 NOTE — Progress Notes (Signed)
  Radiation Oncology         [phone] ________________________________  Name: Christopher Rocha MRN: [id number]  Date: 03/05/2023  DOB: 26-Feb-1941  End of Treatment Note  Diagnosis: The primary encounter diagnosis was Adenocarcinoma, lung, right (HCC). A diagnosis of Pulmonary nodule was also pertinent to this visit.       New left lower lobe pulmonary nodule ; suspicious for non-small cell carcinoma based on cytology   Indication for treatment: Curative       Radiation treatment dates: 01/23/23 through 01/30/23   Site/dose: Left lung - 54 Gy delivered 3 Fx at 18 Gy/Fx  Technique/Mode: SBRT/SRT-IMRT / Photon   Beams/energy: 6X-FFF  Narrative: The patient tolerated radiation treatment relatively well. On the date of his final treatment, the patient endorsed mild fatigue, dry skin, and a productive cough (related to bronchitis per patient). He denied any concerns otherwise including SOB.   Plan: The patient has completed radiation treatment. The patient will return to radiation oncology clinic for routine followup in one month. I advised them to call or return sooner if they have any questions or concerns related to their recovery or treatment.  -----------------------------------  Billie Lade, PhD, MD  This document serves as a record of services personally performed by Antony Blackbird, MD. It was created on his behalf by Neena Rhymes, a trained medical scribe. The creation of this record is based on the scribe's personal observations and the provider's statements to them. This document has been checked and approved by the attending provider.

## 2023-03-04 NOTE — Progress Notes (Signed)
Radiation Oncology         [phone] ________________________________  Name: Christopher Rocha MRN: [id number]  Date: 03/05/2023  DOB: 1941/03/19  Follow-Up Visit Note  CC: Marylen Ponto, MD  Leslye Peer, MD  No diagnosis found.  Diagnosis:  The primary encounter diagnosis was Adenocarcinoma, lung, right (HCC). A diagnosis of Pulmonary nodule was also pertinent to this visit.        New left lower lobe pulmonary nodule ; suspicious for non-small cell carcinoma based on cytology   Interval Since Last Radiation: 1 month and 3 days   3) Indication for treatment: Curative      Radiation treatment dates: 01/23/23 through 01/30/23  Site/dose: Left lung - 54 Gy delivered 3 Fx at 18 Gy/Fx Technique/Mode: SBRT/SRT-IMRT / Photon  Beams/energy: 6X-FFF  2) Intent: Curative Radiation Treatment Dates: 03/09/2022 through 03/16/2022 Site Technique Total Dose (Gy) Dose per Fx (Gy) Completed Fx Beam Energies  Lung, Right: Lung_R IMRT 54/54 18 3/3 6XFFF   1) Radiation Treatment Dates: 06/03/2019 through 06/10/2019 Site Technique Total Dose Dose per Fx Completed Fx Beam Energies  Thorax: Lung_Rt IMRT 54/54 18 3/3 6XFFF      Narrative:  The patient returns today for routine follow-up.  The patient tolerated radiation treatment relatively well. On the date of his final treatment, the patient endorsed mild fatigue, dry skin, and a productive cough (related to bronchitis per patient). He denied any concerns otherwise including SOB.    Prior to starting radiation therapy, the patient underwent bronchoscopy with biopsies of the LLL on 11/27/22 under the care of Dr. Delton Coombes. Biopsies of the LLL showed findings suspicious for non-small cell carcinoma. (LLL lavage showed no evidence of malignancy).   During his most recent visit with Dr. Delton Coombes on 12/27/22, the patient reported having a cough productive of white mucus, likely related to his history of chronic rhinitis. Dr. Delton Coombes has started him on  Atrovent nasal spray for nasal congestion and drainage  He will also continue on Symbicort 2 puffs twice a day.   ***                               Allergies:  is allergic to penicillins.  Meds: Current Outpatient Medications  Medication Sig Dispense Refill   ipratropium (ATROVENT) 0.03 % nasal spray Place 2 sprays into both nostrils every 12 (twelve) hours. 30 mL 12   acetaminophen (TYLENOL) 325 MG tablet Take 650 mg by mouth every 6 (six) hours as needed for mild pain.     acetaminophen (TYLENOL) 500 MG tablet Take 500 mg by mouth every 8 (eight) hours as needed (pain).     aspirin EC 81 MG tablet Take 81 mg by mouth every other day.      budesonide-formoterol (SYMBICORT) 160-4.5 MCG/ACT inhaler Inhale 2 puffs into the lungs 2 (two) times daily. 1 Inhaler 5   fluticasone (FLONASE) 50 MCG/ACT nasal spray Place 2 sprays into both nostrils daily. 16 g 2   ibuprofen (ADVIL) 200 MG tablet Take 200 mg by mouth every 6 (six) hours as needed for mild pain.     ipratropium (ATROVENT HFA) 17 MCG/ACT inhaler Inhale 2 puffs into the lungs every 4 (four) hours as needed for wheezing. 1 each 12   losartan-hydrochlorothiazide (HYZAAR) 100-12.5 MG tablet Take 1 tablet by mouth daily.     Naphazoline-Glycerin (REDNESS RELIEF OP) Place 1 drop into both eyes daily as needed (for  redness/itching).     PROAIR HFA 108 (90 Base) MCG/ACT inhaler Inhale 2 puffs into the lungs every 4 (four) hours as needed for shortness of breath.     rosuvastatin (CRESTOR) 10 MG tablet Take 1 tablet by mouth once daily (Patient not taking: Reported on 10/23/2022) 90 tablet 2   triamcinolone (KENALOG) 0.1 % Apply 1 application. topically 3 (three) times daily as needed (itching).     No current facility-administered medications for this encounter.    Physical Findings: The patient is in no acute distress. Patient is alert and oriented.  vitals were not taken for this visit. .  No significant changes. Lungs are clear to  auscultation bilaterally. Heart has regular rate and rhythm. No palpable cervical, supraclavicular, or axillary adenopathy. Abdomen soft, non-tender, normal bowel sounds.   Lab Findings: Lab Results  Component Value Date   WBC 14.5 (H) 11/27/2022   HGB 13.2 11/27/2022   HCT 40.6 11/27/2022   MCV 92.1 11/27/2022   PLT 201 11/27/2022    Radiographic Findings: No results found.  Impression:  The primary encounter diagnosis was Adenocarcinoma, lung, right (HCC). A diagnosis of Pulmonary nodule was also pertinent to this visit.        New left lower lobe pulmonary nodule ; suspicious for non-small cell carcinoma based on cytology     The patient is recovering from the effects of radiation.  ***  Plan:  ***   *** minutes of total time was spent for this patient encounter, including preparation, face-to-face counseling with the patient and coordination of care, physical exam, and documentation of the encounter. ____________________________________  Billie Lade, PhD, MD  This document serves as a record of services personally performed by Antony Blackbird, MD. It was created on his behalf by Neena Rhymes, a trained medical scribe. The creation of this record is based on the scribe's personal observations and the provider's statements to them. This document has been checked and approved by the attending provider.

## 2023-03-05 ENCOUNTER — Ambulatory Visit
Admission: RE | Admit: 2023-03-05 | Discharge: 2023-03-05 | Disposition: A | Discharge status: Home / Self Care | Payer: Medicare Other | Source: Ambulatory Visit | Attending: Radiation Oncology | Admitting: Radiation Oncology

## 2023-03-05 ENCOUNTER — Encounter: Payer: Self-pay | Admitting: Radiation Oncology

## 2023-03-05 ENCOUNTER — Other Ambulatory Visit: Payer: Self-pay

## 2023-03-05 VITALS — BP 163/66 | HR 65 | Temp 97.7°F | Resp 18 | Ht 71.0 in | Wt 185.0 lb

## 2023-03-05 DIAGNOSIS — Z923 Personal history of irradiation: Secondary | ICD-10-CM | POA: Insufficient documentation

## 2023-03-05 DIAGNOSIS — C3411 Malignant neoplasm of upper lobe, right bronchus or lung: Secondary | ICD-10-CM | POA: Insufficient documentation

## 2023-03-05 DIAGNOSIS — C3491 Malignant neoplasm of unspecified part of right bronchus or lung: Secondary | ICD-10-CM

## 2023-03-05 DIAGNOSIS — C3432 Malignant neoplasm of lower lobe, left bronchus or lung: Secondary | ICD-10-CM | POA: Diagnosis not present

## 2023-03-05 DIAGNOSIS — C3492 Malignant neoplasm of unspecified part of left bronchus or lung: Secondary | ICD-10-CM

## 2023-03-05 NOTE — Progress Notes (Signed)
Christopher Rocha is here today for follow up post radiation to the lung.  Lung Side: left lung 01-23-23 to 01-12-23  Does the patient complain of any of the following: Pain: No  Shortness of breath w/wo exertion: Sob when he goes outside and with activity. Cough: no Hemoptysis: no Pain with swallowing: none Swallowing/choking concerns: none Appetite: good Energy Level: moderate fatigue Post radiation skin Changes: no issues    Additional comments if applicable: Vitals:   03/05/23 0955  BP: (!) 163/66  Pulse: 65  Resp: 18  Temp: 97.7 F (36.5 C)  TempSrc: Temporal  SpO2: 99%  Weight: 83.9 kg  Height: 5\' 11"  (1.803 m)   Denies any s/s of hypertension

## 2023-03-21 ENCOUNTER — Other Ambulatory Visit: Payer: Self-pay | Admitting: Oncology

## 2023-03-21 DIAGNOSIS — D508 Other iron deficiency anemias: Secondary | ICD-10-CM

## 2023-03-21 NOTE — Progress Notes (Signed)
[facility]  [address] [place],  [place]  [address] [phone]  Clinic Day:  03/22/2023  Referring physician: Marylen Ponto, MD  HISTORY OF PRESENT ILLNESS:  Christopher Rocha is an 82 y.o. male with a history of iron deficiency anemia, which previously improved after he received IV iron.  He comes in today to reassess his anemia.  Since his last visit, the patient has been doing okay.  He denies having increased fatigue or any overt forms of blood loss which concern him for recurrent iron deficiency anemia.  This gentleman also brings to my attention that he has a third lung cancer that is being treated with stereotactic radiation.  He claims this lesion is in his left lower lobe.  He had a right lower lobe lung adenocarcinoma for which he underwent stereotactic radiation to this lesion in July 2023.  He previously had a right upper lobe lung mass that was biopsy proven in September 2020 to be adenocarcinoma.  Staging ultimately showed him to have stage IIA (T2b N0 M0) disease.  The patient completed stereotactic radiation to this lesion in November 2020.     VITALS:  Blood pressure 120/63, pulse 87, temperature 98.6 F (37 C), resp. rate 18, height 5\' 11"  (1.803 m), weight 188 lb 3.2 oz (85.4 kg), SpO2 91%.  Wt Readings from Last 3 Encounters:  03/22/23 188 lb 3.2 oz (85.4 kg)  03/05/23 185 lb (83.9 kg)  12/27/22 185 lb 3.2 oz (84 kg)    Body mass index is 26.25 kg/m.  Performance status (ECOG): 0 - Asymptomatic  PHYSICAL EXAM:  Physical Exam Constitutional:      General: He is not in acute distress.    Appearance: Normal appearance. He is normal weight.  HENT:     Head: Normocephalic and atraumatic.  Eyes:     General: No scleral icterus.    Extraocular Movements: Extraocular movements intact.     Conjunctiva/sclera: Conjunctivae normal.     Pupils: Pupils are equal, round, and reactive to light.  Cardiovascular:     Rate and  Rhythm: Normal rate and regular rhythm.     Pulses: Normal pulses.     Heart sounds: Normal heart sounds. No murmur heard.    No friction rub. No gallop.  Pulmonary:     Effort: Pulmonary effort is normal. No respiratory distress.     Breath sounds: Normal breath sounds.  Abdominal:     General: Bowel sounds are normal. There is no distension.     Palpations: Abdomen is soft. There is no hepatomegaly, splenomegaly or mass.     Tenderness: There is no abdominal tenderness.  Musculoskeletal:        General: Normal range of motion.     Cervical back: Normal range of motion and neck supple.     Right lower leg: No edema.     Left lower leg: No edema.  Lymphadenopathy:     Cervical: No cervical adenopathy.  Skin:    General: Skin is warm and dry.  Neurological:     General: No focal deficit present.     Mental Status: He is alert and oriented to person, place, and time. Mental status is at baseline.  Psychiatric:        Mood and Affect: Mood normal.        Behavior: Behavior normal.        Thought Content: Thought content normal.        Judgment:  Judgment normal.     LABS:       Latest Ref Rng & Units 03/22/2023   12:00 AM 11/27/2022    6:02 AM 09/21/2022   12:00 AM  CBC  WBC  10.8     14.5  11.7      Hemoglobin 13.5 - 17.5 11.4     13.2  13.2      Hematocrit 41 - 53 34     40.6  39      Platelets 150 - 400 K/uL 187     201  189         This result is from an external source.      Latest Ref Rng & Units 03/22/2023    9:41 AM 11/27/2022    6:02 AM 09/21/2022   10:00 AM  CMP  Glucose 70 - 99 mg/dL 782  956  213   BUN 8 - 23 mg/dL 28  15  23    Creatinine 0.61 - 1.24 mg/dL 0.86  5.78  4.69   Sodium 135 - 145 mmol/L 135  134  134   Potassium 3.5 - 5.1 mmol/L 3.7  4.0  4.4   Chloride 98 - 111 mmol/L 103  100  102   CO2 22 - 32 mmol/L 24  26  23    Calcium 8.9 - 10.3 mg/dL 8.9  9.3  9.3   Total Protein 6.5 - 8.1 g/dL 7.3   7.3   Total Bilirubin 0.3 - 1.2 mg/dL 0.7   0.6    Alkaline Phos 38 - 126 U/L 78   77   AST 15 - 41 U/L 17   18   ALT 0 - 44 U/L 12   14     Latest Reference Range & Units 03/22/23 09:41  Iron 45 - 182 ug/dL 52  UIBC ug/dL 629  TIBC 528 - 413 ug/dL 244  Saturation Ratios 17.9 - 39.5 % 16 (L)  Ferritin 24 - 336 ng/mL 84  (L): Data is abnormally low  ASSESSMENT & PLAN:  A 82 year old gentleman with a history of iron deficiency anemia.  His hemoglobin of 11.4 is lower today than what it has been in the past.  However, his iron parameters are not particularly consistent with iron deficiency anemia.  Of note, his labs do show an increased percentage of monocytes, which has been an issue for numerous years.  If his hemoglobin continues to fall in the absence of iron deficiency anemia, a bone marrow biopsy may need to be considered to rule out such etiologies as myelodysplasia or chronic myelomonocytic leukemia.  As mentioned previously, this gentleman was recently diagnosed with another early stage lung cancer for which he completed another course of stereotactic radiation in [place].  He knows to follow-up with radiation oncology with respect to getting his surveillance chest CT scans.  Otherwise, I will see this patient back in 6 months for repeat clinical assessment.  The patient understands all the plans discussed today and is in agreement with them.  Pattrick Bady Kirby Funk, MD

## 2023-03-22 ENCOUNTER — Other Ambulatory Visit: Payer: Self-pay | Admitting: Oncology

## 2023-03-22 ENCOUNTER — Inpatient Hospital Stay: Payer: Medicare Other | Attending: Oncology | Admitting: Oncology

## 2023-03-22 ENCOUNTER — Inpatient Hospital Stay: Payer: Medicare Other

## 2023-03-22 VITALS — BP 120/63 | HR 87 | Temp 98.6°F | Resp 18 | Ht 71.0 in | Wt 188.2 lb

## 2023-03-22 DIAGNOSIS — D508 Other iron deficiency anemias: Secondary | ICD-10-CM | POA: Diagnosis not present

## 2023-03-22 DIAGNOSIS — C3491 Malignant neoplasm of unspecified part of right bronchus or lung: Secondary | ICD-10-CM

## 2023-03-22 DIAGNOSIS — D649 Anemia, unspecified: Secondary | ICD-10-CM | POA: Diagnosis not present

## 2023-03-22 DIAGNOSIS — D509 Iron deficiency anemia, unspecified: Secondary | ICD-10-CM | POA: Insufficient documentation

## 2023-03-22 LAB — CMP (CANCER CENTER ONLY)
ALT: 12 U/L (ref 0–44)
AST: 17 U/L (ref 15–41)
Albumin: 3.5 g/dL (ref 3.5–5.0)
Alkaline Phosphatase: 78 U/L (ref 38–126)
Anion gap: 8 (ref 5–15)
BUN: 28 mg/dL — ABNORMAL HIGH (ref 8–23)
CO2: 24 mmol/L (ref 22–32)
Calcium: 8.9 mg/dL (ref 8.9–10.3)
Chloride: 103 mmol/L (ref 98–111)
Creatinine: 1.24 mg/dL (ref 0.61–1.24)
GFR, Estimated: 58 mL/min — ABNORMAL LOW (ref >60)
Glucose, Bld: 176 mg/dL — ABNORMAL HIGH (ref 70–99)
Potassium: 3.7 mmol/L (ref 3.5–5.1)
Sodium: 135 mmol/L (ref 135–145)
Total Bilirubin: 0.7 mg/dL (ref 0.3–1.2)
Total Protein: 7.3 g/dL (ref 6.5–8.1)

## 2023-03-22 LAB — CBC AND DIFFERENTIAL
HCT: 34 — AB (ref 41–53)
Hemoglobin: 11.4 — AB (ref 13.5–17.5)
Neutrophils Absolute: 7.56
Platelets: 187 10*3/uL (ref 150–400)
WBC: 10.8

## 2023-03-22 LAB — IRON AND TIBC
Iron: 52 ug/dL (ref 45–182)
Saturation Ratios: 16 % — ABNORMAL LOW (ref 17.9–39.5)
TIBC: 319 ug/dL (ref 250–450)
UIBC: 267 ug/dL

## 2023-03-22 LAB — FERRITIN: Ferritin: 84 ng/mL (ref 24–336)

## 2023-03-22 LAB — CBC: RBC: 3.86 — AB (ref 3.87–5.11)

## 2023-03-23 ENCOUNTER — Telehealth: Payer: Self-pay | Admitting: Oncology

## 2023-03-23 ENCOUNTER — Telehealth: Payer: Self-pay

## 2023-03-23 NOTE — Telephone Encounter (Signed)
03/23/23 spoke with patient and confirmed next appt

## 2023-03-23 NOTE — Telephone Encounter (Signed)
Patient called for lab results, and to schedule follow-up appt . Dr Melvyn Neth reviewed labs reported as normal, patient aware and Angelique Blonder will call him shortly and schedule f/u appt, patient voiced understanding.

## 2023-05-21 ENCOUNTER — Telehealth: Payer: Self-pay | Admitting: *Deleted

## 2023-05-21 NOTE — Telephone Encounter (Signed)
CALLED PATIENT TO INFORM OF CT FOR 06-18-23- ARRIVAL TIME- 12:15 PM @ [facility], NO RESTRICTIONS TO TEST, PATIENT TO RECEIVE RESULTS FROM DR,. KINARD ON 06-21-23 @ 11AM, SPOKE WITH PATIENT AND HE IS AWARE OF THESE APPTS. AND THE INSTRUCTIONS

## 2023-06-15 ENCOUNTER — Telehealth: Payer: Self-pay | Admitting: *Deleted

## 2023-06-15 NOTE — Telephone Encounter (Signed)
Returned patient's daughter Hedy Jacob) phone call, spoke with patient's daughter, Hedy Jacob

## 2023-06-18 ENCOUNTER — Encounter (HOSPITAL_COMMUNITY): Payer: Self-pay

## 2023-06-18 ENCOUNTER — Ambulatory Visit (HOSPITAL_COMMUNITY)
Admission: RE | Admit: 2023-06-18 | Discharge: 2023-06-18 | Disposition: A | Discharge status: Home / Self Care | Payer: Medicare Other | Source: Ambulatory Visit | Attending: Radiation Oncology | Admitting: Radiation Oncology

## 2023-06-18 DIAGNOSIS — C3490 Malignant neoplasm of unspecified part of unspecified bronchus or lung: Secondary | ICD-10-CM | POA: Diagnosis not present

## 2023-06-18 DIAGNOSIS — C3491 Malignant neoplasm of unspecified part of right bronchus or lung: Secondary | ICD-10-CM | POA: Insufficient documentation

## 2023-06-18 DIAGNOSIS — J439 Emphysema, unspecified: Secondary | ICD-10-CM | POA: Diagnosis not present

## 2023-06-18 DIAGNOSIS — K449 Diaphragmatic hernia without obstruction or gangrene: Secondary | ICD-10-CM | POA: Diagnosis not present

## 2023-06-18 DIAGNOSIS — J90 Pleural effusion, not elsewhere classified: Secondary | ICD-10-CM | POA: Diagnosis not present

## 2023-06-20 NOTE — Progress Notes (Signed)
Radiation Oncology         [phone] ________________________________  Name: Christopher Rocha MRN: [id number]  Date: 06/21/2023  DOB: August 25, 1941  Follow-Up Visit Note  CC: Marylen Ponto, MD  Leslye Peer, MD  No diagnosis found.  Diagnosis: The primary encounter diagnosis was Adenocarcinoma, lung, right (HCC). A diagnosis of Pulmonary nodule was also pertinent to this visit.        New left lower lobe pulmonary nodule ; suspicious for non-small cell carcinoma based on cytology    Interval Since Last Radiation: 4 months and 19 days   3) Indication for treatment: Curative      Radiation treatment dates: 01/23/23 through 01/30/23  Site/dose: Left lung - 54 Gy delivered 3 Fx at 18 Gy/Fx Technique/Mode: SBRT/SRT-IMRT / Photon  Beams/energy: 6X-FFF   2) Intent: Curative Radiation Treatment Dates: 03/09/2022 through 03/16/2022 Site Technique Total Dose (Gy) Dose per Fx (Gy) Completed Fx Beam Energies  Lung, Right: Lung_R IMRT 54/54 18 3/3 6XFFF    1) Radiation Treatment Dates: 06/03/2019 through 06/10/2019 Site Technique Total Dose Dose per Fx Completed Fx Beam Energies  Thorax: Lung_Rt IMRT 54/54 18 3/3 6XFFF    Narrative:  The patient returns today for routine follow-up and to review recent imaging. He was last seen here for follow-up on 03/05/23.            His most recent chest CT on 06/18/23 shows a new fiduciary marker in the area of the previous 10 mm LLL nodule, possibly representing a post treatment related etiology. CT otherwise showed interval stability of the parenchymal opacity along the right lower lobe with a fiduciary marker, in the confluence opacity in the right upper lobe, and in the prominent mediastinal lymph nodes. An improving right pleural effusion was also demonstrated.   He most recently followed up with Dr. Anne Ng on 03/22/23 in the setting of his iron deficiency anemia.  Labs reviewed at that time showed Hg of 11.4 which was lower than his  previous value. Iron parameters were otherwise not that consistent with iron deficiency anemia, however an increased percentage of monocytes was noted, which has been an issue for numerous years. If his hemoglobin continues to fall in the absence of iron deficiency anemia, Dr. Anne Ng may recommend a bone marrow biopsy to rule out such etiologies such as myelodysplasia or chronic myelomonocytic leukemia.     No other significant interval history since the patient was last seen for follow-up.   ***                 Allergies:  is allergic to penicillins.  Meds: Current Outpatient Medications  Medication Sig Dispense Refill   acetaminophen (TYLENOL) 325 MG tablet Take 650 mg by mouth every 6 (six) hours as needed for mild pain.     acetaminophen (TYLENOL) 500 MG tablet Take 500 mg by mouth every 8 (eight) hours as needed (pain). (Patient not taking: Reported on 03/05/2023)     aspirin EC 81 MG tablet Take 81 mg by mouth every other day.      budesonide-formoterol (SYMBICORT) 160-4.5 MCG/ACT inhaler Inhale 2 puffs into the lungs 2 (two) times daily. 1 Inhaler 5   fluticasone (FLONASE) 50 MCG/ACT nasal spray Place 2 sprays into both nostrils daily. 16 g 2   ibuprofen (ADVIL) 200 MG tablet Take 200 mg by mouth every 6 (six) hours as needed for mild pain.     ipratropium (ATROVENT HFA) 17 MCG/ACT inhaler Inhale 2 puffs into  the lungs every 4 (four) hours as needed for wheezing. 1 each 12   ipratropium (ATROVENT) 0.03 % nasal spray Place 2 sprays into both nostrils every 12 (twelve) hours. 30 mL 12   losartan-hydrochlorothiazide (HYZAAR) 100-12.5 MG tablet Take 1 tablet by mouth daily.     Naphazoline-Glycerin (REDNESS RELIEF OP) Place 1 drop into both eyes daily as needed (for redness/itching).     PROAIR HFA 108 (90 Base) MCG/ACT inhaler Inhale 2 puffs into the lungs every 4 (four) hours as needed for shortness of breath.     rosuvastatin (CRESTOR) 10 MG tablet Take 1 tablet by mouth once daily  (Patient not taking: Reported on 10/23/2022) 90 tablet 2   triamcinolone (KENALOG) 0.1 % Apply 1 application. topically 3 (three) times daily as needed (itching).     No current facility-administered medications for this encounter.    Physical Findings: The patient is in no acute distress. Patient is alert and oriented.  vitals were not taken for this visit. .  No significant changes. Lungs are clear to auscultation bilaterally. Heart has regular rate and rhythm. No palpable cervical, supraclavicular, or axillary adenopathy. Abdomen soft, non-tender, normal bowel sounds.   Lab Findings: Lab Results  Component Value Date   WBC 10.8 03/22/2023   HGB 11.4 (A) 03/22/2023   HCT 34 (A) 03/22/2023   MCV 92.1 11/27/2022   PLT 187 03/22/2023    Radiographic Findings: CT CHEST WO CONTRAST  Addendum Date: 06/20/2023   ADDENDUM REPORT: 06/20/2023 10:04 ADDENDUM: Additional impression, acute appearing fracture of the posterior aspect of the right tenth rib. Please correlate with clinical history. Electronically Signed   By: Karen Kays M.D.   On: 06/20/2023 10:04   Result Date: 06/20/2023 CLINICAL DATA:  Non-small-cell lung cancer nonmetastatic. Assess treatment response. Status post XRT. * Tracking Code: BO * EXAM: CT CHEST WITHOUT CONTRAST TECHNIQUE: Multidetector CT imaging of the chest was performed following the standard protocol without IV contrast. RADIATION DOSE REDUCTION: This exam was performed according to the departmental dose-optimization program which includes automated exposure control, adjustment of the mA and/or kV according to patient size and/or use of iterative reconstruction technique. COMPARISON:  Standard noncontrast CT 10/20/2022. PET-CT scan 11/09/2022 FINDINGS: Cardiovascular: Heart is slightly enlarged. Patient is status post median sternotomy. Status post TAVR. Prominent coronary artery calcifications are seen. The thoracic aorta on this non IV contrast exam has a normal  course and caliber but has once again diffuse scattered vascular calcifications. Once again there is some enlargement of the pulmonary arteries. Please correlate for pulmonary artery hypertension. Mediastinum/Nodes: Moderate hiatal hernia. Preserved heterogeneous thyroid. On this non IV contrast exam, there is no specific abnormal lymph node enlargement identified in the axillary regions or hila. As seen previously there is small precarinal node. On prior examination this has short axis of 10 mm and today 9 mm on series 2, image 55. Prominent subcarinal node is also stable. No new mediastinal nodal enlargement. Lungs/Pleura: Once again there is some patchy ill-defined opacity in the right lower lobe with a central fiduciary marker similar to previous examination. There is new fiduciary marker in the left lung base in the area of previous nodule with some similar consolidative patchy opacity to that of the right lung. The previous right-sided small effusion is improving. There is also a stable masslike opacity along the posterior right upper lobe. On previous examination this measured 6.0 x 3.8 cm and today when measured in a similar fashion on series 8, image 35 the  area would measure 6.0 x 4.0 cm, not significantly changed when adjusting for technique. Mild emphysematous changes. No pneumothorax. Mild linear areas of opacity along the anterior left lower lobe, likely scar or atelectasis as well as some areas in the lingula. Upper Abdomen: In the upper abdomen the adrenal glands are grossly preserved. Cystic lesion along the left kidney is incompletely evaluated on this examination. Please correlate prior PET-CT. Musculoskeletal: Moderate degenerative changes of the spine. There are old upper right lateral rib fractures. However there is an acute appearing fracture involving the posterior aspect of the right tenth rib. IMPRESSION: New fiduciary marker in the area of the previous 10 mm left lower lobe nodule with  consolidative opacity. This could be posttreatment related recommend continued short-term follow up surveillance. Stable parenchymal opacity along the right lower lobe with a fiduciary marker as well as the confluence opacity in the right upper lobe. Improving right pleural effusion. Stable prominent mediastinal nodes. Moderate hiatal hernia. Postop chest Aortic Atherosclerosis (ICD10-I70.0) and Emphysema (ICD10-J43.9). Electronically Signed: By: Karen Kays M.D. On: 06/20/2023 10:01    Impression: The primary encounter diagnosis was Adenocarcinoma, lung, right (HCC). A diagnosis of Pulmonary nodule was also pertinent to this visit.        New left lower lobe pulmonary nodule ; suspicious for non-small cell carcinoma based on cytology   The patient is recovering from the effects of radiation.  ***  Plan:  ***   *** minutes of total time was spent for this patient encounter, including preparation, face-to-face counseling with the patient and coordination of care, physical exam, and documentation of the encounter. ____________________________________  Billie Lade, PhD, MD  This document serves as a record of services personally performed by Antony Blackbird, MD. It was created on his behalf by Neena Rhymes, a trained medical scribe. The creation of this record is based on the scribe's personal observations and the provider's statements to them. This document has been checked and approved by the attending provider.

## 2023-06-21 ENCOUNTER — Encounter: Payer: Self-pay | Admitting: Radiation Oncology

## 2023-06-21 ENCOUNTER — Other Ambulatory Visit: Payer: Self-pay

## 2023-06-21 ENCOUNTER — Ambulatory Visit
Admission: RE | Admit: 2023-06-21 | Discharge: 2023-06-21 | Disposition: A | Discharge status: Home / Self Care | Payer: Medicare Other | Source: Ambulatory Visit | Attending: Radiation Oncology | Admitting: Radiation Oncology

## 2023-06-21 VITALS — BP 211/81 | Temp 97.8°F | Resp 18 | Ht 71.0 in | Wt 194.1 lb

## 2023-06-21 DIAGNOSIS — J90 Pleural effusion, not elsewhere classified: Secondary | ICD-10-CM | POA: Diagnosis not present

## 2023-06-21 DIAGNOSIS — K449 Diaphragmatic hernia without obstruction or gangrene: Secondary | ICD-10-CM | POA: Diagnosis not present

## 2023-06-21 DIAGNOSIS — Z7951 Long term (current) use of inhaled steroids: Secondary | ICD-10-CM | POA: Insufficient documentation

## 2023-06-21 DIAGNOSIS — R911 Solitary pulmonary nodule: Secondary | ICD-10-CM | POA: Diagnosis not present

## 2023-06-21 DIAGNOSIS — I700 Atherosclerosis of aorta: Secondary | ICD-10-CM | POA: Diagnosis not present

## 2023-06-21 DIAGNOSIS — Z79899 Other long term (current) drug therapy: Secondary | ICD-10-CM | POA: Insufficient documentation

## 2023-06-21 DIAGNOSIS — J439 Emphysema, unspecified: Secondary | ICD-10-CM | POA: Diagnosis not present

## 2023-06-21 DIAGNOSIS — Z923 Personal history of irradiation: Secondary | ICD-10-CM | POA: Insufficient documentation

## 2023-06-21 DIAGNOSIS — C3432 Malignant neoplasm of lower lobe, left bronchus or lung: Secondary | ICD-10-CM | POA: Diagnosis not present

## 2023-06-21 DIAGNOSIS — Z85118 Personal history of other malignant neoplasm of bronchus and lung: Secondary | ICD-10-CM | POA: Insufficient documentation

## 2023-06-21 DIAGNOSIS — C3492 Malignant neoplasm of unspecified part of left bronchus or lung: Secondary | ICD-10-CM

## 2023-06-21 DIAGNOSIS — D509 Iron deficiency anemia, unspecified: Secondary | ICD-10-CM | POA: Diagnosis not present

## 2023-06-21 NOTE — Progress Notes (Signed)
Christopher Rocha is here today for follow up post radiation to the lung.  Lung Side: Left, patient completed treatment on 01/30/23.   Does the patient complain of any of the following: Pain:No Shortness of breath w/wo exertion: Yes Cough: Yes Hemoptysis: No Pain with swallowing: No Swallowing/choking concerns: Yes choking due to hiatal hernia. Appetite: Good  Energy Level: Low Post radiation skin Changes: No    Additional comments if applicable:   BP (!) 211/81 (BP Location: Left Arm, Patient Position: Sitting)   Temp 97.8 F (36.6 C) (Temporal)   Resp 18   Ht 5\' 11"  (1.803 m)   Wt 194 lb 2 oz (88.1 kg)   SpO2 98%   BMI 27.07 kg/m

## 2023-08-22 DIAGNOSIS — J42 Unspecified chronic bronchitis: Secondary | ICD-10-CM | POA: Diagnosis not present

## 2023-08-22 DIAGNOSIS — Z79899 Other long term (current) drug therapy: Secondary | ICD-10-CM | POA: Diagnosis not present

## 2023-08-22 DIAGNOSIS — I10 Essential (primary) hypertension: Secondary | ICD-10-CM | POA: Diagnosis not present

## 2023-08-22 DIAGNOSIS — Z0000 Encounter for general adult medical examination without abnormal findings: Secondary | ICD-10-CM | POA: Diagnosis not present

## 2023-08-22 DIAGNOSIS — E7800 Pure hypercholesterolemia, unspecified: Secondary | ICD-10-CM | POA: Diagnosis not present

## 2023-08-22 DIAGNOSIS — D519 Vitamin B12 deficiency anemia, unspecified: Secondary | ICD-10-CM | POA: Diagnosis not present

## 2023-09-20 ENCOUNTER — Other Ambulatory Visit: Payer: Self-pay | Admitting: Oncology

## 2023-09-20 DIAGNOSIS — D508 Other iron deficiency anemias: Secondary | ICD-10-CM

## 2023-09-20 NOTE — Progress Notes (Signed)
[facility]  [address] [place],  [place]  [address] [phone]  Clinic Day:  09/21/2023  Referring physician: Marylen Ponto, MD  HISTORY OF PRESENT ILLNESS:  Christopher Rocha is an 83 y.o. male with a history of iron deficiency anemia, which previously improved after he received IV iron.  He comes in today to reassess his anemia.  Since his last visit, the patient has been doing okay.  He denies having increased fatigue or any overt forms of blood loss which concern him for recurrent iron deficiency anemia.  This gentleman also has a history of multiple lung cancers.  His third lung cancer was treated with stereotactic radiation in May 2024.  He had a right lower lobe lung adenocarcinoma for which he underwent stereotactic radiation to this lesion in July 2023.  He previously had a right upper lobe lung mass that was biopsy proven in September 2020 to be adenocarcinoma.  Staging ultimately showed him to have stage IIA (T2b N0 M0) disease.  The patient completed stereotactic radiation to this lesion in November 2020.    VITALS:  Blood pressure (!) 188/65, pulse (!) 58, temperature 98 F (36.7 C), temperature source Oral, resp. rate 20, height 5\' 11"  (1.803 m), weight 198 lb 1.6 oz (89.9 kg), SpO2 97%.  Wt Readings from Last 3 Encounters:  09/21/23 198 lb 1.6 oz (89.9 kg)  06/21/23 194 lb 2 oz (88.1 kg)  03/22/23 188 lb 3.2 oz (85.4 kg)    Body mass index is 27.63 kg/m.  Performance status (ECOG): 0 - Asymptomatic  PHYSICAL EXAM:  Physical Exam Constitutional:      General: He is not in acute distress.    Appearance: Normal appearance. He is normal weight.  HENT:     Head: Normocephalic and atraumatic.  Eyes:     General: No scleral icterus.    Extraocular Movements: Extraocular movements intact.     Conjunctiva/sclera: Conjunctivae normal.     Pupils: Pupils are equal, round, and reactive to light.  Cardiovascular:     Rate and  Rhythm: Normal rate and regular rhythm.     Pulses: Normal pulses.     Heart sounds: Normal heart sounds. No murmur heard.    No friction rub. No gallop.  Pulmonary:     Effort: Pulmonary effort is normal. No respiratory distress.     Breath sounds: Normal breath sounds.  Abdominal:     General: Bowel sounds are normal. There is no distension.     Palpations: Abdomen is soft. There is no hepatomegaly, splenomegaly or mass.     Tenderness: There is no abdominal tenderness.  Musculoskeletal:        General: Normal range of motion.     Cervical back: Normal range of motion and neck supple.     Right lower leg: No edema.     Left lower leg: No edema.  Lymphadenopathy:     Cervical: No cervical adenopathy.  Skin:    General: Skin is warm and dry.  Neurological:     General: No focal deficit present.     Mental Status: He is alert and oriented to person, place, and time. Mental status is at baseline.  Psychiatric:        Mood and Affect: Mood normal.        Behavior: Behavior normal.        Thought Content: Thought content normal.        Judgment: Judgment normal.  LABS:      Latest Ref Rng & Units 09/21/2023    9:23 AM 03/22/2023   12:00 AM 11/27/2022    6:02 AM  CBC  WBC 4.0 - 10.5 K/uL 11.4  10.8     14.5   Hemoglobin 13.0 - 17.0 g/dL 95.2  84.1     32.4   Hematocrit 39.0 - 52.0 % 34.3  34     40.6   Platelets 150 - 400 K/uL 205  187     201      This result is from an external source.      Latest Ref Rng & Units 09/21/2023    9:23 AM 03/22/2023    9:41 AM 11/27/2022    6:02 AM  CMP  Glucose 70 - 99 mg/dL 401  027  253   BUN 8 - 23 mg/dL 23  28  15    Creatinine 0.61 - 1.24 mg/dL 6.64  4.03  4.74   Sodium 135 - 145 mmol/L 136  135  134   Potassium 3.5 - 5.1 mmol/L 4.0  3.7  4.0   Chloride 98 - 111 mmol/L 98  103  100   CO2 22 - 32 mmol/L 27  24  26    Calcium 8.9 - 10.3 mg/dL 25.9  8.9  9.3   Total Protein 6.5 - 8.1 g/dL 7.6  7.3    Total Bilirubin 0.0 - 1.2 mg/dL  0.3  0.7    Alkaline Phos 38 - 126 U/L 109  78    AST 15 - 41 U/L 17  17    ALT 0 - 44 U/L 10  12      Latest Reference Range & Units 09/21/23 09:23  Neutrophils % 61  Lymphocytes % 6  Monocytes Relative % 23  Eosinophil % 5  Basophil % 1  Immature Granulocytes % 4    Latest Reference Range & Units 09/21/23 09:23  Iron 45 - 182 ug/dL 59  UIBC ug/dL 563  TIBC 875 - 643 ug/dL 329  Saturation Ratios 17.9 - 39.5 % 18  Ferritin 24 - 336 ng/mL 110   ASSESSMENT & PLAN:  A 83 year old gentleman with a history of iron deficiency anemia.  His hemoglobin of 11.5 today is essentially unchanged versus what it was previously.  Furthermore, his iron parameters today show no evidence of iron deficiency being present.  Of note, his labs continue to show an increased percentage of monocytes, which has been an issue for numerous years.  If his hemoglobin continues to fall in the absence of iron deficiency anemia or his white cells or platelets abruptly change, a bone marrow biopsy would be done to rule out such etiologies as myelodysplasia or chronic myelomonocytic leukemia.  As mentioned previously, this gentleman underwent stereotactic radiation to treat his third early stage lung cancer in May 2024.  CT scans in October 2024 showed no findings worrisome for disease progression.  Moving forward, the patient will have his CT scans done here for his radiographic lung cancer surveillance.  I will see this patient back in 6 months for repeat clinical assessment.  A chest CT will be done a day before that visit as part of his lung cancer surveillance.  The patient understands all the plans discussed today and is in agreement with them.  Deshannon Seide Kirby Funk, MD

## 2023-09-21 ENCOUNTER — Inpatient Hospital Stay: Payer: Medicare Other

## 2023-09-21 ENCOUNTER — Other Ambulatory Visit: Payer: Self-pay | Admitting: Oncology

## 2023-09-21 ENCOUNTER — Telehealth: Payer: Self-pay

## 2023-09-21 ENCOUNTER — Inpatient Hospital Stay: Payer: Medicare Other | Attending: Oncology | Admitting: Oncology

## 2023-09-21 VITALS — BP 188/65 | HR 58 | Temp 98.0°F | Resp 20 | Ht 71.0 in | Wt 198.1 lb

## 2023-09-21 DIAGNOSIS — D508 Other iron deficiency anemias: Secondary | ICD-10-CM

## 2023-09-21 DIAGNOSIS — C3491 Malignant neoplasm of unspecified part of right bronchus or lung: Secondary | ICD-10-CM

## 2023-09-21 DIAGNOSIS — D509 Iron deficiency anemia, unspecified: Secondary | ICD-10-CM | POA: Diagnosis not present

## 2023-09-21 DIAGNOSIS — Z85118 Personal history of other malignant neoplasm of bronchus and lung: Secondary | ICD-10-CM | POA: Insufficient documentation

## 2023-09-21 LAB — CMP (CANCER CENTER ONLY)
ALT: 10 U/L (ref 0–44)
AST: 17 U/L (ref 15–41)
Albumin: 3.9 g/dL (ref 3.5–5.0)
Alkaline Phosphatase: 109 U/L (ref 38–126)
Anion gap: 10 (ref 5–15)
BUN: 23 mg/dL (ref 8–23)
CO2: 27 mmol/L (ref 22–32)
Calcium: 10 mg/dL (ref 8.9–10.3)
Chloride: 98 mmol/L (ref 98–111)
Creatinine: 1.32 mg/dL — ABNORMAL HIGH (ref 0.61–1.24)
GFR, Estimated: 54 mL/min — ABNORMAL LOW (ref >60)
Glucose, Bld: 142 mg/dL — ABNORMAL HIGH (ref 70–99)
Potassium: 4 mmol/L (ref 3.5–5.1)
Sodium: 136 mmol/L (ref 135–145)
Total Bilirubin: 0.3 mg/dL (ref 0.0–1.2)
Total Protein: 7.6 g/dL (ref 6.5–8.1)

## 2023-09-21 LAB — CBC WITH DIFFERENTIAL (CANCER CENTER ONLY)
Abs Immature Granulocytes: 0.42 10*3/uL — ABNORMAL HIGH (ref 0.00–0.07)
Basophils Absolute: 0.1 10*3/uL (ref 0.0–0.1)
Basophils Relative: 1 %
Eosinophils Absolute: 0.5 10*3/uL (ref 0.0–0.5)
Eosinophils Relative: 5 %
HCT: 34.3 % — ABNORMAL LOW (ref 39.0–52.0)
Hemoglobin: 11.5 g/dL — ABNORMAL LOW (ref 13.0–17.0)
Immature Granulocytes: 4 %
Lymphocytes Relative: 6 %
Lymphs Abs: 0.6 10*3/uL — ABNORMAL LOW (ref 0.7–4.0)
MCH: 30.4 pg (ref 26.0–34.0)
MCHC: 33.5 g/dL (ref 30.0–36.0)
MCV: 90.7 fL (ref 80.0–100.0)
Monocytes Absolute: 2.6 10*3/uL — ABNORMAL HIGH (ref 0.1–1.0)
Monocytes Relative: 23 %
Neutro Abs: 7.1 10*3/uL (ref 1.7–7.7)
Neutrophils Relative %: 61 %
Platelet Count: 205 10*3/uL (ref 150–400)
RBC: 3.78 MIL/uL — ABNORMAL LOW (ref 4.22–5.81)
RDW: 13.8 % (ref 11.5–15.5)
WBC Count: 11.4 10*3/uL — ABNORMAL HIGH (ref 4.0–10.5)
nRBC: 0 % (ref 0.0–0.2)
nRBC: 0 /100{WBCs}

## 2023-09-21 LAB — FERRITIN: Ferritin: 110 ng/mL (ref 24–336)

## 2023-09-21 LAB — IRON AND TIBC
Iron: 59 ug/dL (ref 45–182)
Saturation Ratios: 18 % (ref 17.9–39.5)
TIBC: 332 ug/dL (ref 250–450)
UIBC: 273 ug/dL

## 2023-09-21 NOTE — Telephone Encounter (Signed)
Latest Reference Range & Units 09/21/23 09:23  Iron 45 - 182 ug/dL 59  UIBC ug/dL 161  TIBC 096 - 045 ug/dL 409  Saturation Ratios 17.9 - 39.5 % 18  Ferritin 24 - 336 ng/mL 110

## 2023-10-11 ENCOUNTER — Ambulatory Visit: Payer: Medicare Other | Admitting: Emergency Medicine

## 2023-10-11 ENCOUNTER — Encounter: Payer: Self-pay | Admitting: Emergency Medicine

## 2023-10-11 VITALS — BP 202/92 | HR 61 | Ht 71.0 in | Wt 195.0 lb

## 2023-10-11 DIAGNOSIS — C3491 Malignant neoplasm of unspecified part of right bronchus or lung: Secondary | ICD-10-CM | POA: Diagnosis not present

## 2023-10-11 DIAGNOSIS — J301 Allergic rhinitis due to pollen: Secondary | ICD-10-CM

## 2023-10-11 DIAGNOSIS — J411 Mucopurulent chronic bronchitis: Secondary | ICD-10-CM | POA: Diagnosis not present

## 2023-10-11 NOTE — Assessment & Plan Note (Signed)
 He has had bilateral non-small cell lung cancers, treated with SBRT.  Gets his surveillance imaging and follows up with Drs. Lewis and Kinard.

## 2023-10-11 NOTE — Assessment & Plan Note (Signed)
 Temporarily stop your Symbicort .  Try starting Breztri  2 puffs twice a day.  Keep track of how this medication helps you.  If you feel like it helps your breathing more then please let our office know.  We can order it through your pharmacy to see if it is affordable.  If so we will change you to Breztri . Keep your albuterol  available to use 2 puffs when needed for shortness of breath, chest tightness, wheezing. Follow-up with APP in 6 months, sooner if you have any problems Follow Dr. Shelah in 12 months, sooner if you have problems

## 2023-10-11 NOTE — Patient Instructions (Addendum)
 Continue to follow with Dr. Ezzard and Dr. Shannon as planned.  Get your repeat chest imaging per their plans Temporarily stop your Symbicort .  Try starting Breztri  2 puffs twice a day.  Keep track of how this medication helps you.  If you feel like it helps your breathing more then please let our office know.  We can order it through your pharmacy to see if it is affordable.  If so we will change you to Breztri . Keep your albuterol  available to use 2 puffs when needed for shortness of breath, chest tightness, wheezing. Continue your nasal sprays as you have been using them Follow-up with APP in 6 months, sooner if you have any problems Follow Dr. Shelah in 12 months, sooner if you have problems

## 2023-10-11 NOTE — Progress Notes (Signed)
   Subjective:    Patient ID: Christopher Rocha, male    DOB: Jan 11, 1941, 83 y.o.   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  He had an enlarging left lower lobe pulmonary nodule that we evaluated with navigational bronchoscopy on 11/27/2022, consistent with non-small cell lung cancer.  He is planning to receive SBRT for this with Dr. Shannon. Had to be delayed so he could have a GB evaluation - turned out to have a hernia, did not require surgery.  Currently managed on Symbicort , never started Flonase . He reports that his abdominal swelling is better, and his breathing is as well.  He has had cough, white mucous. Happens daily. No blood.   ROV 10/11/2023 --83 year old man with COPD, SBRT to the stage IIa right upper lobe adenocarcinoma, SBRT to a right lower lobe adenocarcinoma, SBRT to the left lower lobe pulmonary nodule 11/2022 (Dr. Shannon). Currently managed on Symbicort , albuterol  approximately . He has not had any changes in his breathing, does have some difficulty when it is humid. Some daily cough.  Atrovent  nasal spray, Flonase    Review of Systems As per HPI    Objective:   Physical Exam  Vitals:   10/11/23 1524  BP: (!) 202/92  Pulse: 61  SpO2: 98%  Weight: 195 lb (88.5 kg)  Height: 5' 11 (1.803 m)   Gen: Pleasant, well-nourished, in no distress,  normal affect  ENT: No lesions,  mouth clear,  oropharynx clear, no postnasal drip  Neck: No JVD, no stridor  Lungs: No use of accessory muscles, distant, no crackles or wheezing on normal respiration, no wheeze on forced expiration  Cardiovascular: RRR, 3/6 syst M  Musculoskeletal: No deformities, no cyanosis or clubbing  Neuro: alert, awake, non focal  Skin: Warm, no lesions or rash      Assessment & Plan:  Adenocarcinoma, lung, right (HCC) He has had  bilateral non-small cell lung cancers, treated with SBRT.  Gets his surveillance imaging and follows up with Drs. Lewis and Kinard.  COPD (chronic obstructive pulmonary disease) (HCC) Temporarily stop your Symbicort .  Try starting Breztri  2 puffs twice a day.  Keep track of how this medication helps you.  If you feel like it helps your breathing more then please let our office know.  We can order it through your pharmacy to see if it is affordable.  If so we will change you to Breztri . Keep your albuterol  available to use 2 puffs when needed for shortness of breath, chest tightness, wheezing. Follow-up with APP in 6 months, sooner if you have any problems Follow Dr. Shelah in 12 months, sooner if you have problems  Allergic rhinitis Continue current regimen    Lamar Shelah, MD, PhD 10/11/2023, 3:52 PM [facility] [phone] or if no answer before 7:00PM call [phone] For any issues after 7:00PM please call eLink [phone]

## 2023-10-11 NOTE — Assessment & Plan Note (Signed)
 Continue current regimen

## 2023-12-06 ENCOUNTER — Telehealth: Payer: Self-pay | Admitting: *Deleted

## 2023-12-06 NOTE — Telephone Encounter (Signed)
 CALLED PATIENT TO INFORM OF CT FOR 12-20-23- ARRIVAL TIME- 12 PM @ [facility], NO RESTRICTIONS TO SCAN, PATIENT TO RECEIVE RESULTS FROM DR. KINARD ON 12-24-23 @ 4 PM, LVM FOR A RETURN CALL

## 2023-12-11 ENCOUNTER — Telehealth: Payer: Self-pay | Admitting: Radiation Oncology

## 2023-12-11 NOTE — Telephone Encounter (Signed)
 4/8 Patient's daughter called to requested her father's ct scans and follow up appts going forward to be done with Dr. Melvyn Neth, office in [facility].  Secure chat sent to [facility]azing and copied Darryl Nestle, so they are aware.

## 2023-12-20 ENCOUNTER — Ambulatory Visit (HOSPITAL_COMMUNITY)
Admission: RE | Admit: 2023-12-20 | Discharge: 2023-12-20 | Disposition: A | Discharge status: Home / Self Care | Source: Ambulatory Visit | Attending: Radiology | Admitting: Radiology

## 2023-12-20 ENCOUNTER — Encounter (HOSPITAL_COMMUNITY): Payer: Self-pay

## 2023-12-20 DIAGNOSIS — C3490 Malignant neoplasm of unspecified part of unspecified bronchus or lung: Secondary | ICD-10-CM | POA: Diagnosis not present

## 2023-12-20 DIAGNOSIS — R911 Solitary pulmonary nodule: Secondary | ICD-10-CM | POA: Diagnosis not present

## 2023-12-24 ENCOUNTER — Ambulatory Visit
Admission: RE | Admit: 2023-12-24 | Discharge: 2023-12-24 | Disposition: A | Discharge status: Home / Self Care | Payer: Self-pay | Source: Ambulatory Visit | Attending: Radiation Oncology | Admitting: Radiation Oncology

## 2023-12-24 ENCOUNTER — Encounter: Payer: Self-pay | Admitting: Radiation Oncology

## 2023-12-24 VITALS — BP 178/68 | HR 63 | Resp 20 | Ht 71.0 in | Wt 192.2 lb

## 2023-12-24 DIAGNOSIS — Z79899 Other long term (current) drug therapy: Secondary | ICD-10-CM | POA: Diagnosis not present

## 2023-12-24 DIAGNOSIS — J479 Bronchiectasis, uncomplicated: Secondary | ICD-10-CM | POA: Diagnosis not present

## 2023-12-24 DIAGNOSIS — I3481 Nonrheumatic mitral (valve) annulus calcification: Secondary | ICD-10-CM | POA: Insufficient documentation

## 2023-12-24 DIAGNOSIS — Z85118 Personal history of other malignant neoplasm of bronchus and lung: Secondary | ICD-10-CM | POA: Diagnosis not present

## 2023-12-24 DIAGNOSIS — Z923 Personal history of irradiation: Secondary | ICD-10-CM | POA: Diagnosis not present

## 2023-12-24 DIAGNOSIS — C3432 Malignant neoplasm of lower lobe, left bronchus or lung: Secondary | ICD-10-CM | POA: Diagnosis not present

## 2023-12-24 DIAGNOSIS — N281 Cyst of kidney, acquired: Secondary | ICD-10-CM | POA: Diagnosis not present

## 2023-12-24 DIAGNOSIS — R918 Other nonspecific abnormal finding of lung field: Secondary | ICD-10-CM | POA: Insufficient documentation

## 2023-12-24 DIAGNOSIS — Z7951 Long term (current) use of inhaled steroids: Secondary | ICD-10-CM | POA: Diagnosis not present

## 2023-12-24 DIAGNOSIS — D649 Anemia, unspecified: Secondary | ICD-10-CM | POA: Insufficient documentation

## 2023-12-24 DIAGNOSIS — R911 Solitary pulmonary nodule: Secondary | ICD-10-CM

## 2023-12-24 NOTE — Progress Notes (Signed)
 Christopher Rocha is here today for follow up post radiation to the lung.  Lung Side: Right, 12/15/21  Does the patient complain of any of the following: Pain:No Shortness of breath w/wo exertion: No Cough: No Hemoptysis: No  Pain with swallowing: No Swallowing/choking concerns: No Appetite: Fair  Energy Level: Good Post radiation skin Changes: No    Additional comments if applicable:   BP (!) 178/68 (BP Location: Right Arm, Patient Position: Sitting, Cuff Size: Normal)   Pulse 63   Resp 20   Ht 5\' 11"  (1.803 m)   Wt 192 lb 3.2 oz (87.2 kg)   SpO2 96%   BMI 26.81 kg/m

## 2023-12-24 NOTE — Progress Notes (Addendum)
 Radiation Oncology         [phone] ________________________________  Name: Christopher Rocha MRN: [id number]  Date: 12/24/2023  DOB: [birth year]  Follow-Up Visit Note  CC: Gaither Juba, MD  Denson Flake, MD    ICD-10-CM   1. Pulmonary nodule  R91.1       Diagnosis: The primary encounter diagnosis was Adenocarcinoma, lung, right (HCC). A diagnosis of Pulmonary nodule was also pertinent to this visit.        New left lower lobe pulmonary nodule ; suspicious for non-small cell carcinoma based on cytology   Interval Since Last Radiation: 10 months and 24 days   3) Indication for treatment: Curative      Radiation treatment dates: 01/23/23 through 01/30/23  Site/dose: Left lung - 54 Gy delivered 3 Fx at 18 Gy/Fx Technique/Mode: SBRT/SRT-IMRT / Photon  Beams/energy: 6X-FFF   2) Intent: Curative Radiation Treatment Dates: 03/09/2022 through 03/16/2022 Site Technique Total Dose (Gy) Dose per Fx (Gy) Completed Fx Beam Energies  Lung, Right: Lung_R IMRT 54/54 18 3/3 6XFFF    1) Radiation Treatment Dates: 06/03/2019 through 06/10/2019 Site Technique Total Dose Dose per Fx Completed Fx Beam Energies  Thorax: Lung_Rt IMRT 54/54 18 3/3 6XFFF    Narrative:  The patient returns today for routine follow-up and to review recent imaging. He was last seen here for follow-up on 06/21/23.   His most recent chest CT performed on 12/20/23 shows stable findings in the chest overall, with no significant change in the right upper lobe mass or bilateral basilar patchy consolidative patterns of both lower lobes demonstrated. There are however new ill-defined nodular infiltrates within the lingula which warrants attention to follow-up (83-month follow-up CT recommended).     In the interval since his last visit, he has continued to follow with Dr. Baldwin Levee and Dr. Harles Lied. During his most recent follow-up visit with Dr. Rosaline Coma on 10/11/23, he was noted to be doing well from a respiratory standpoint  other than an ongoing cough and some difficulty breathing when it is humid out. He was advised to temporarily stop his Symbicort  and to try starting Breztri  with 2 puffs twice a day.   He most recently followed up with Dr. Harles Lied on 09/21/23. With regards to his history of anemia, his labs showed stable findings. Labs reviewed at that time however again showed an increased percentage of monocytes, which has been an issue for numerous years.  If his hemoglobin continues to fall in the absence of iron deficiency anemia, or if his white cells or platelets abruptly change, Dr. Harles Lied may consider him for a bone marrow biopsy to rule out such etiologies as myelodysplasia or chronic myelomonocytic leukemia. He was otherwise noted to be doing well and will continue to follow with Dr. Harles Lied under surveillance.   Patient reports to be doing well overall today.  He denies any changes to his breathing since we last saw him and is working with Dr. Baldwin Levee for management of his inhalers.                          Allergies:  is allergic to penicillins.  Meds: Current Outpatient Medications  Medication Sig Dispense Refill   acetaminophen  (TYLENOL ) 325 MG tablet Take 650 mg by mouth every 6 (six) hours as needed for mild pain.     acetaminophen  (TYLENOL ) 500 MG tablet Take 500 mg by mouth every 8 (eight) hours as needed (pain).  aspirin  EC 81 MG tablet Take 81 mg by mouth every other day.      budesonide -formoterol  (SYMBICORT ) 160-4.5 MCG/ACT inhaler Inhale 2 puffs into the lungs 2 (two) times daily. 1 Inhaler 5   fluticasone  (FLONASE ) 50 MCG/ACT nasal spray Place 2 sprays into both nostrils daily. 16 g 2   ibuprofen (ADVIL) 200 MG tablet Take 200 mg by mouth every 6 (six) hours as needed for mild pain.     ipratropium (ATROVENT  HFA) 17 MCG/ACT inhaler Inhale 2 puffs into the lungs every 4 (four) hours as needed for wheezing. 1 each 12   ipratropium (ATROVENT ) 0.03 % nasal spray Place 2 sprays into both nostrils  every 12 (twelve) hours. 30 mL 12   losartan -hydrochlorothiazide  (HYZAAR) 100-12.5 MG tablet Take 1 tablet by mouth daily.     Naphazoline-Glycerin (REDNESS RELIEF OP) Place 1 drop into both eyes daily as needed (for redness/itching).     PROAIR  HFA 108 (90 Base) MCG/ACT inhaler Inhale 2 puffs into the lungs every 4 (four) hours as needed for shortness of breath.     rosuvastatin  (CRESTOR ) 10 MG tablet Take 1 tablet by mouth once daily 90 tablet 2   triamcinolone  (KENALOG ) 0.1 % Apply 1 application. topically 3 (three) times daily as needed (itching).     No current facility-administered medications for this encounter.    Physical Findings: The patient is in no acute distress. Patient is alert and oriented.  height is 5\' 11"  (1.803 m) and weight is 192 lb 3.2 oz (87.2 kg). His blood pressure is 178/68 (abnormal) and his pulse is 63. His respiration is 20 and oxygen saturation is 96%. .  No significant changes. Lungs are clear to auscultation bilaterally. Heart has regular rate and rhythm. No palpable cervical, supraclavicular, or axillary adenopathy. Abdomen soft, non-tender, normal bowel sounds.   Lab Findings: Lab Results  Component Value Date   WBC 11.4 (H) 09/21/2023   HGB 11.5 (L) 09/21/2023   HCT 34.3 (L) 09/21/2023   MCV 90.7 09/21/2023   PLT 205 09/21/2023    Radiographic Findings: CT Chest Wo Contrast Result Date: 12/21/2023 CLINICAL DATA:  Non-small cell lung cancer follow-up EXAM: CT CHEST WITHOUT CONTRAST TECHNIQUE: Multidetector CT imaging of the chest was performed following the standard protocol without IV contrast. RADIATION DOSE REDUCTION: This exam was performed according to the departmental dose-optimization program which includes automated exposure control, adjustment of the mA and/or kV according to patient size and/or use of iterative reconstruction technique. COMPARISON:  June 18, 2023 FINDINGS: Cardiovascular: No significant vascular findings. Normal heart size.  No pericardial effusion. Status post aortic valve stent placement. Extensive coronary artery calcifications Extensive calcification of the annulus of the mitral valve Mediastinum/Nodes: No enlarged mediastinal or axillary lymph nodes. Thyroid  gland, trachea, and esophagus demonstrate no significant findings. Lungs/Pleura: Comparison with prior examination no significant change in the right upper lobe mass and fibro atelectatic changes measuring similar dimensions of 5 x 3 cm in maximum transverse and AP diameter. With adjacent bronchiectatic changes and pleural attachment. Comparison with prior examinations demonstrates no significant change in the bilateral basilar patchy consolidative pattern of both lower lobes. No change in the fiduciary lung marker in the right lower lobe. Ill-defined nodular infiltrates are now present within the lingula. No other pulmonary masses or nodules. Upper Abdomen: No change hiatal hernia or multiple bilateral benign-appearing renal cortical cyst. Musculoskeletal: No chest wall mass or suspicious bone lesions identified. IMPRESSION: Stable CT chest compared with prior examination. No significant change in  the right upper or bilateral basilar pulmonary findings. New ill-defined nodular infiltrates within the lingula. Six-month follow-up CT recommended Electronically Signed   By: Fredrich Jefferson M.D.   On: 12/21/2023 10:17    Impression/Plan: The primary encounter diagnosis was Adenocarcinoma, lung, right (HCC). A diagnosis of Pulmonary nodule was also pertinent to this visit.        New left lower lobe pulmonary nodule ; suspicious for non-small cell carcinoma based on cytology   The patient is recovering from the effects of radiation.  We reviewed the results of his most recent CT scan which fortunately demonstrates no evidence of new or progressive disease.   Patient is being followed by Dr. Harles Lied for his anemia.  He will continue to monitor this patient for his lung cancer as  well.  Patient is scheduled for a CT the chest in 3 months and an office visit with Dr. Harles Lied on 03/20/2024.  Radiation follow-up as needed.  We appreciate the opportunity to take part in this patient's care.  He was encouraged to call our office with any questions or concerns.   20 minutes of total time was spent for this patient encounter, including preparation, face-to-face counseling with the patient and coordination of care, physical exam, and documentation of the encounter. ____________________________________   Julio Ohm, PA-C   This document serves as a record of services personally performed by Julio Ohm, PA-C. It was created on his behalf by Aleta Anda, a trained medical scribe. The creation of this record is based on the scribe's personal observations and the provider's statements to them. This document has been checked and approved by the attending provider.

## 2024-01-20 DIAGNOSIS — J9601 Acute respiratory failure with hypoxia: Secondary | ICD-10-CM | POA: Diagnosis not present

## 2024-01-20 DIAGNOSIS — D72820 Lymphocytosis (symptomatic): Secondary | ICD-10-CM | POA: Diagnosis not present

## 2024-01-20 DIAGNOSIS — R0902 Hypoxemia: Secondary | ICD-10-CM | POA: Diagnosis not present

## 2024-01-20 DIAGNOSIS — R1084 Generalized abdominal pain: Secondary | ICD-10-CM | POA: Diagnosis not present

## 2024-01-20 DIAGNOSIS — Z880 Allergy status to penicillin: Secondary | ICD-10-CM | POA: Diagnosis not present

## 2024-01-20 DIAGNOSIS — Z7901 Long term (current) use of anticoagulants: Secondary | ICD-10-CM | POA: Diagnosis not present

## 2024-01-20 DIAGNOSIS — I517 Cardiomegaly: Secondary | ICD-10-CM | POA: Diagnosis not present

## 2024-01-20 DIAGNOSIS — I4891 Unspecified atrial fibrillation: Secondary | ICD-10-CM | POA: Diagnosis not present

## 2024-01-20 DIAGNOSIS — E785 Hyperlipidemia, unspecified: Secondary | ICD-10-CM | POA: Diagnosis not present

## 2024-01-20 DIAGNOSIS — I447 Left bundle-branch block, unspecified: Secondary | ICD-10-CM | POA: Diagnosis not present

## 2024-01-20 DIAGNOSIS — R109 Unspecified abdominal pain: Secondary | ICD-10-CM | POA: Diagnosis not present

## 2024-01-20 DIAGNOSIS — I361 Nonrheumatic tricuspid (valve) insufficiency: Secondary | ICD-10-CM | POA: Diagnosis not present

## 2024-01-20 DIAGNOSIS — I5033 Acute on chronic diastolic (congestive) heart failure: Secondary | ICD-10-CM | POA: Diagnosis not present

## 2024-01-20 DIAGNOSIS — J441 Chronic obstructive pulmonary disease with (acute) exacerbation: Secondary | ICD-10-CM | POA: Diagnosis not present

## 2024-01-20 DIAGNOSIS — N2889 Other specified disorders of kidney and ureter: Secondary | ICD-10-CM | POA: Diagnosis not present

## 2024-01-20 DIAGNOSIS — Z7951 Long term (current) use of inhaled steroids: Secondary | ICD-10-CM | POA: Diagnosis not present

## 2024-01-20 DIAGNOSIS — R918 Other nonspecific abnormal finding of lung field: Secondary | ICD-10-CM | POA: Diagnosis not present

## 2024-01-20 DIAGNOSIS — I2510 Atherosclerotic heart disease of native coronary artery without angina pectoris: Secondary | ICD-10-CM | POA: Diagnosis not present

## 2024-01-20 DIAGNOSIS — I480 Paroxysmal atrial fibrillation: Secondary | ICD-10-CM | POA: Diagnosis not present

## 2024-01-20 DIAGNOSIS — S2231XA Fracture of one rib, right side, initial encounter for closed fracture: Secondary | ICD-10-CM | POA: Diagnosis not present

## 2024-01-20 DIAGNOSIS — D72821 Monocytosis (symptomatic): Secondary | ICD-10-CM | POA: Diagnosis not present

## 2024-01-20 DIAGNOSIS — R1310 Dysphagia, unspecified: Secondary | ICD-10-CM | POA: Diagnosis not present

## 2024-01-20 DIAGNOSIS — N281 Cyst of kidney, acquired: Secondary | ICD-10-CM | POA: Diagnosis not present

## 2024-01-20 DIAGNOSIS — Z87891 Personal history of nicotine dependence: Secondary | ICD-10-CM | POA: Diagnosis not present

## 2024-01-20 DIAGNOSIS — I340 Nonrheumatic mitral (valve) insufficiency: Secondary | ICD-10-CM | POA: Diagnosis not present

## 2024-01-20 DIAGNOSIS — Z952 Presence of prosthetic heart valve: Secondary | ICD-10-CM | POA: Diagnosis not present

## 2024-01-20 DIAGNOSIS — R7989 Other specified abnormal findings of blood chemistry: Secondary | ICD-10-CM | POA: Diagnosis not present

## 2024-01-20 DIAGNOSIS — A419 Sepsis, unspecified organism: Secondary | ICD-10-CM | POA: Diagnosis not present

## 2024-01-20 DIAGNOSIS — Z7982 Long term (current) use of aspirin: Secondary | ICD-10-CM | POA: Diagnosis not present

## 2024-01-20 DIAGNOSIS — R0689 Other abnormalities of breathing: Secondary | ICD-10-CM | POA: Diagnosis not present

## 2024-01-20 DIAGNOSIS — R079 Chest pain, unspecified: Secondary | ICD-10-CM | POA: Diagnosis not present

## 2024-01-20 DIAGNOSIS — D72829 Elevated white blood cell count, unspecified: Secondary | ICD-10-CM | POA: Diagnosis not present

## 2024-01-20 DIAGNOSIS — D72828 Other elevated white blood cell count: Secondary | ICD-10-CM | POA: Diagnosis not present

## 2024-01-20 DIAGNOSIS — R1930 Abdominal rigidity, unspecified site: Secondary | ICD-10-CM | POA: Diagnosis not present

## 2024-01-20 DIAGNOSIS — J309 Allergic rhinitis, unspecified: Secondary | ICD-10-CM | POA: Diagnosis not present

## 2024-01-20 DIAGNOSIS — R6520 Severe sepsis without septic shock: Secondary | ICD-10-CM | POA: Diagnosis not present

## 2024-01-20 DIAGNOSIS — J440 Chronic obstructive pulmonary disease with acute lower respiratory infection: Secondary | ICD-10-CM | POA: Diagnosis not present

## 2024-01-20 DIAGNOSIS — Z951 Presence of aortocoronary bypass graft: Secondary | ICD-10-CM | POA: Diagnosis not present

## 2024-01-20 DIAGNOSIS — I10 Essential (primary) hypertension: Secondary | ICD-10-CM | POA: Diagnosis not present

## 2024-01-20 DIAGNOSIS — I493 Ventricular premature depolarization: Secondary | ICD-10-CM | POA: Diagnosis not present

## 2024-01-20 DIAGNOSIS — I160 Hypertensive urgency: Secondary | ICD-10-CM | POA: Diagnosis not present

## 2024-01-20 DIAGNOSIS — I868 Varicose veins of other specified sites: Secondary | ICD-10-CM | POA: Diagnosis not present

## 2024-01-20 DIAGNOSIS — N179 Acute kidney failure, unspecified: Secondary | ICD-10-CM | POA: Diagnosis not present

## 2024-01-20 DIAGNOSIS — J449 Chronic obstructive pulmonary disease, unspecified: Secondary | ICD-10-CM | POA: Diagnosis not present

## 2024-01-20 DIAGNOSIS — I3481 Nonrheumatic mitral (valve) annulus calcification: Secondary | ICD-10-CM | POA: Diagnosis not present

## 2024-01-20 DIAGNOSIS — J90 Pleural effusion, not elsewhere classified: Secondary | ICD-10-CM | POA: Diagnosis not present

## 2024-01-20 DIAGNOSIS — I110 Hypertensive heart disease with heart failure: Secondary | ICD-10-CM | POA: Diagnosis not present

## 2024-01-20 DIAGNOSIS — R000 Tachycardia, unspecified: Secondary | ICD-10-CM | POA: Diagnosis not present

## 2024-01-20 DIAGNOSIS — I440 Atrioventricular block, first degree: Secondary | ICD-10-CM | POA: Diagnosis not present

## 2024-01-21 DIAGNOSIS — I447 Left bundle-branch block, unspecified: Secondary | ICD-10-CM | POA: Diagnosis not present

## 2024-01-21 DIAGNOSIS — I440 Atrioventricular block, first degree: Secondary | ICD-10-CM | POA: Diagnosis not present

## 2024-01-21 DIAGNOSIS — R000 Tachycardia, unspecified: Secondary | ICD-10-CM | POA: Diagnosis not present

## 2024-01-22 DIAGNOSIS — I480 Paroxysmal atrial fibrillation: Secondary | ICD-10-CM | POA: Diagnosis not present

## 2024-01-22 DIAGNOSIS — I2510 Atherosclerotic heart disease of native coronary artery without angina pectoris: Secondary | ICD-10-CM | POA: Diagnosis not present

## 2024-01-22 DIAGNOSIS — I4891 Unspecified atrial fibrillation: Secondary | ICD-10-CM | POA: Diagnosis not present

## 2024-01-22 DIAGNOSIS — I447 Left bundle-branch block, unspecified: Secondary | ICD-10-CM | POA: Diagnosis not present

## 2024-01-22 DIAGNOSIS — R7989 Other specified abnormal findings of blood chemistry: Secondary | ICD-10-CM | POA: Diagnosis not present

## 2024-01-22 DIAGNOSIS — I493 Ventricular premature depolarization: Secondary | ICD-10-CM | POA: Diagnosis not present

## 2024-01-22 DIAGNOSIS — Z952 Presence of prosthetic heart valve: Secondary | ICD-10-CM | POA: Diagnosis not present

## 2024-01-22 DIAGNOSIS — I10 Essential (primary) hypertension: Secondary | ICD-10-CM | POA: Diagnosis not present

## 2024-01-25 DIAGNOSIS — I447 Left bundle-branch block, unspecified: Secondary | ICD-10-CM | POA: Diagnosis not present

## 2024-01-25 DIAGNOSIS — I493 Ventricular premature depolarization: Secondary | ICD-10-CM | POA: Diagnosis not present

## 2024-01-25 DIAGNOSIS — I4891 Unspecified atrial fibrillation: Secondary | ICD-10-CM | POA: Diagnosis not present

## 2024-01-30 ENCOUNTER — Telehealth: Payer: Self-pay

## 2024-01-30 ENCOUNTER — Other Ambulatory Visit: Payer: Self-pay

## 2024-01-30 DIAGNOSIS — J189 Pneumonia, unspecified organism: Secondary | ICD-10-CM | POA: Insufficient documentation

## 2024-01-30 DIAGNOSIS — R0600 Dyspnea, unspecified: Secondary | ICD-10-CM | POA: Insufficient documentation

## 2024-01-30 NOTE — Telephone Encounter (Signed)
 Pt's daughter called to let us  know her dad has been in hospital at [facility] with pneumonia. He took his last prednisone  today. She is wanting him to have oxygen at bedtime as well (it wasn't ordered at hospital d/c).  I notified Dr Harles Lied of above. He states pt should f/u with his PCP. He doesn't see reason for pt to be seen here any earlier than scheduled appt in July 2025.

## 2024-01-31 ENCOUNTER — Ambulatory Visit: Admitting: Cardiology

## 2024-02-01 DIAGNOSIS — G4730 Sleep apnea, unspecified: Secondary | ICD-10-CM | POA: Diagnosis not present

## 2024-02-01 DIAGNOSIS — D510 Vitamin B12 deficiency anemia due to intrinsic factor deficiency: Secondary | ICD-10-CM | POA: Diagnosis not present

## 2024-02-01 DIAGNOSIS — Z09 Encounter for follow-up examination after completed treatment for conditions other than malignant neoplasm: Secondary | ICD-10-CM | POA: Diagnosis not present

## 2024-03-05 ENCOUNTER — Telehealth: Payer: Self-pay | Admitting: Oncology

## 2024-03-05 NOTE — Telephone Encounter (Signed)
 03/05/24 Spoke with patients daughter and scheduled CT SCAN AT [facility] on 03/18/24.

## 2024-03-18 DIAGNOSIS — E611 Iron deficiency: Secondary | ICD-10-CM | POA: Diagnosis not present

## 2024-03-18 DIAGNOSIS — C3491 Malignant neoplasm of unspecified part of right bronchus or lung: Secondary | ICD-10-CM | POA: Diagnosis not present

## 2024-03-18 LAB — LAB REPORT - SCANNED: EGFR: 85

## 2024-03-19 ENCOUNTER — Other Ambulatory Visit: Payer: Self-pay | Admitting: Oncology

## 2024-03-19 NOTE — Progress Notes (Unsigned)
 [facility]  [address] [place],  [place]  [address] [phone]  Clinic Day:  03/20/2024  Referring physician: Ina Marcellus RAMAN, MD  HISTORY OF PRESENT ILLNESS:  Christopher Rocha is an 83 y.o. male with a history of iron deficiency anemia, which previously improved after he received IV iron.  He comes in today to reassess his anemia.  Since his last visit, the patient was hospitalized for pneumonia in May 2025.  He has also had issues with sciatica.  However, he believes his major problem is that his dentures no longer fit to where he can eat any solid food.  He is 4 weeks away from having his new dentures ready.  Over these past months, he has been unable to eat solids, which he believes is the major cause why he has lost weight.  He also comes in today to go over his recent chest CT, which was done as part of his radiographic lung cancer surveillance.    This gentleman has a history of multiple lung cancers.  His third lung cancer was treated with stereotactic radiation in May 2024.  He had a right lower lobe lung adenocarcinoma for which he underwent stereotactic radiation to this lesion in July 2023.  He previously had a right upper lobe lung mass that was biopsy proven in September 2020 to be adenocarcinoma.  Staging ultimately showed him to have stage IIA (T2b N0 M0) disease.  The patient completed stereotactic radiation to this lesion in November 2020.    VITALS:  Blood pressure (!) 155/109, pulse 89, temperature 98 F (36.7 C), temperature source Oral, resp. rate (!) 22, height 5' 11 (1.803 m), weight 167 lb (75.8 kg), SpO2 96%.  Wt Readings from Last 3 Encounters:  03/20/24 167 lb (75.8 kg)  12/24/23 192 lb 3.2 oz (87.2 kg)  10/11/23 195 lb (88.5 kg)    Body mass index is 23.29 kg/m.  Performance status (ECOG): 1  PHYSICAL EXAM:  Physical Exam Constitutional:      General: He is not in acute distress.    Appearance: Normal appearance.  He is normal weight.     Comments: He is thinner versus previous visits  HENT:     Head: Normocephalic and atraumatic.     Mouth/Throat:     Dentition: Has dentures (They are poorly fitting).  Eyes:     General: No scleral icterus.    Extraocular Movements: Extraocular movements intact.     Conjunctiva/sclera: Conjunctivae normal.     Pupils: Pupils are equal, round, and reactive to light.  Cardiovascular:     Rate and Rhythm: Normal rate and regular rhythm.     Pulses: Normal pulses.     Heart sounds: Normal heart sounds. No murmur heard.    No friction rub. No gallop.  Pulmonary:     Effort: Pulmonary effort is normal. No respiratory distress.     Breath sounds: Normal breath sounds.  Abdominal:     General: Bowel sounds are normal. There is no distension.     Palpations: Abdomen is soft. There is no hepatomegaly, splenomegaly or mass.     Tenderness: There is no abdominal tenderness.  Musculoskeletal:        General: Normal range of motion.     Cervical back: Normal range of motion and neck supple.     Right lower leg: No edema.     Left lower leg: No edema.  Lymphadenopathy:     Cervical: No  cervical adenopathy.  Skin:    General: Skin is warm and dry.  Neurological:     General: No focal deficit present.     Mental Status: He is alert and oriented to person, place, and time. Mental status is at baseline.  Psychiatric:        Mood and Affect: Mood normal.        Behavior: Behavior normal.        Thought Content: Thought content normal.        Judgment: Judgment normal.   SCANS:  His chest CT from 03-18-24 revealed the following: FINDINGS:  Hardware: TAVR  Lymph nodes: Interval increase in size of a right lower paratracheal (4R) lymph node measuring 1.3 x 1.8 cm, while on prior it measured 0.8 x 1.0 cm.  Heart and Vasculature: The heart is enlarged. The patient has undergone prior median sternotomy, CABG, TAVR. Mitral valve calcification is seen. There is enlargement  of the main pulmonary artery suggestive of pulmonary arterial hypertension. No evidence of pulmonary embolus. Severe atherosclerosis without aneurysm.  Lungs and Airways: Some local architectural distortion and volume loss is seen in the right upper lobe associated with the previously identified primary lung lesion. The underlying lesion is blended with resorptive atelectasis making measurement difficult between exams. No new pulmonary lesions seen in the upper lobe.  Some ground-glass opacity is seen in the right lower lobe.  Additionally, patchy airspace opacities are seen at both lung bases as well as some rounded atelectasis surrounding bilateral, high density metallic foci that may represent fiducials.  Pleura: Scant pleural fluid is seen on the left.  Upper Abdomen: Sliding hiatus hernia.  Bones: Duration of the thoracic kyphosis. Disc space narrowing, marginal endplate spurring.  IMPRESSION: 1. Primary lesion right upper lobe is now associated with some posttreatment response, volume loss, architectural distortion and resorptive atelectasis. Measurement of a lesion is difficult in the setting. Most recent prior in the system was dated Jan 30, 2019. 2. There are 2 high density foci in both lower lobes with surrounding airspace disease, rounded atelectasis that may represent posttreatment areas. 3. Interval increase in size of a right lower paratracheal lymph node worrisome for disease progression. 4. Cardiac enlargement, CABG, TAVR. 5. Pulmonary arterial hypertension. No evidence of acute or chronic pulmonary embolus. 6. Sliding hiatus hernia.  LABS:          Latest Ref Rng & Units 09/21/2023    9:23 AM 03/22/2023   12:00 AM 11/27/2022    6:02 AM  CBC  WBC 4.0 - 10.5 K/uL 11.4  10.8     14.5   Hemoglobin 13.0 - 17.0 g/dL 88.4  88.5     86.7   Hematocrit 39.0 - 52.0 % 34.3  34     40.6   Platelets 150 - 400 K/uL 205  187     201      This result is from an external source.       Latest Ref Rng & Units 09/21/2023    9:23 AM 03/22/2023    9:41 AM 11/27/2022    6:02 AM  CMP  Glucose 70 - 99 mg/dL 857  823  861   BUN 8 - 23 mg/dL 23  28  15    Creatinine 0.61 - 1.24 mg/dL 8.67  8.75  8.90   Sodium 135 - 145 mmol/L 136  135  134   Potassium 3.5 - 5.1 mmol/L 4.0  3.7  4.0   Chloride 98 - 111 mmol/L  98  103  100   CO2 22 - 32 mmol/L 27  24  26    Calcium  8.9 - 10.3 mg/dL 89.9  8.9  9.3   Total Protein 6.5 - 8.1 g/dL 7.6  7.3    Total Bilirubin 0.0 - 1.2 mg/dL 0.3  0.7    Alkaline Phos 38 - 126 U/L 109  78    AST 15 - 41 U/L 17  17    ALT 0 - 44 U/L 10  12     Labs collected and [facility] yesterday showed a hemoglobin of 11.1.   ASSESSMENT & PLAN:  A 83 year old gentleman with a history of iron deficiency anemia.  His hemoglobin of 11.1 is slightly lower today than what it was previously.  However, his iron parameters remain fine.  He does have a slight monocytosis, which is unchanged versus previous labs.  However, as his hematologic parameters are normal, they will continue to be followed conservatively.  With respect to his CT scan images, I went over them with him today.  Although there is a comment about a paratracheal lymph node being enlarged, the scans were compared to those done 5 years ago.  This same paratracheal lymph node was seen per scans done in both October 2024 and April 2025.  Overall, I do not see a change that concerns me for this paratracheal lymph node being more ominous for nodal progression.  However, it will continue to be followed over time.  Most importantly, it appears his respiratory status has not been the greatest.  He is scheduled to see pulmonology in early August to reassess his lung function and determine if another inhaler may be beneficial for his breathing.  Otherwise, I will see this patient back in 4 months for repeat clinical assessment.  A chest x-ray will be done on the day of his next visit for his continued lung cancer  surveillance.  The patient understands all the plans discussed today and is in agreement with them.  Seddrick Flax DELENA Kerns, MD

## 2024-03-20 ENCOUNTER — Inpatient Hospital Stay: Payer: Medicare Other | Attending: Oncology | Admitting: Oncology

## 2024-03-20 ENCOUNTER — Telehealth: Payer: Self-pay | Admitting: Oncology

## 2024-03-20 ENCOUNTER — Other Ambulatory Visit: Payer: Self-pay | Admitting: Oncology

## 2024-03-20 ENCOUNTER — Other Ambulatory Visit: Payer: Self-pay

## 2024-03-20 VITALS — BP 155/109 | HR 89 | Temp 98.0°F | Resp 22 | Ht 71.0 in | Wt 167.0 lb

## 2024-03-20 DIAGNOSIS — D509 Iron deficiency anemia, unspecified: Secondary | ICD-10-CM | POA: Diagnosis not present

## 2024-03-20 DIAGNOSIS — C3491 Malignant neoplasm of unspecified part of right bronchus or lung: Secondary | ICD-10-CM

## 2024-03-20 NOTE — Telephone Encounter (Signed)
 Patient has been scheduled for follow-up visit per 03/20/24 LOS.  Pt given an appt calendar with date and time.

## 2024-03-26 ENCOUNTER — Encounter: Payer: Self-pay | Admitting: Oncology

## 2024-04-09 ENCOUNTER — Ambulatory Visit: Payer: Medicare Other | Admitting: Primary Care

## 2024-04-09 ENCOUNTER — Encounter: Payer: Self-pay | Admitting: Primary Care

## 2024-04-09 VITALS — BP 129/64 | HR 99 | Temp 98.1°F | Ht 71.0 in | Wt 170.2 lb

## 2024-04-09 DIAGNOSIS — J449 Chronic obstructive pulmonary disease, unspecified: Secondary | ICD-10-CM | POA: Diagnosis not present

## 2024-04-09 DIAGNOSIS — J441 Chronic obstructive pulmonary disease with (acute) exacerbation: Secondary | ICD-10-CM

## 2024-04-09 DIAGNOSIS — Z23 Encounter for immunization: Secondary | ICD-10-CM | POA: Diagnosis not present

## 2024-04-09 MED ORDER — IPRATROPIUM-ALBUTEROL 0.5-2.5 (3) MG/3ML IN SOLN
3.0000 mL | Freq: Once | RESPIRATORY_TRACT | Status: AC
  Administered 2024-04-09: 3 mL via RESPIRATORY_TRACT

## 2024-04-09 MED ORDER — IPRATROPIUM-ALBUTEROL 0.5-2.5 (3) MG/3ML IN SOLN: 3.0000 mL | Freq: Four times a day (QID) | RESPIRATORY_TRACT | 3 refills | Status: DC | PRN

## 2024-04-09 NOTE — Patient Instructions (Addendum)
 Orders: New neb start re: COPD Flutter valve   INSTRUCTIONS: Please continue using Symbicort  twice daily. Use the ipratropium-albuterol  nebulizer with the flutter valve two to three times daily as instructed. We will repeat your CT scan in six months to monitor your lung condition. If you have any new or worsening symptoms, please contact our office.  Follow-up 6 months with Dr. Byrum

## 2024-04-09 NOTE — Progress Notes (Signed)
 "  @Patient  ID: Christopher Rocha, male    DOB: May 27, 1941, 83 y.o.   MRN: [id number]  Chief Complaint  Patient presents with   COPD    Pt can not take Breztri  due to hand/feet swelling.     Referring provider: Ina Marcellus RAMAN, MD  HPI: 83 year old male, former smoker. 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  He had an enlarging left lower lobe pulmonary nodule that we evaluated with navigational bronchoscopy on 11/27/2022, consistent with non-small cell lung cancer.  He is planning to receive SBRT for this with Dr. Shannon. Had to be delayed so he could have a GB evaluation - turned out to have a hernia, did not require surgery.  Currently managed on Symbicort , never started Flonase . He reports that his abdominal swelling is better, and his breathing is as well.  He has had cough, white mucous. Happens daily. No blood.   ROV 10/11/2023 --83 year old man with COPD, SBRT to the stage IIa right upper lobe adenocarcinoma, SBRT to a right lower lobe adenocarcinoma, SBRT to the left lower lobe pulmonary nodule 11/2022 (Dr. Shannon). Currently managed on Symbicort , albuterol  approximately . He has not had any changes in his breathing, does have some difficulty when it is humid. Some daily cough.  Atrovent  nasal spray, Flonase       04/09/2024 Discussed the use of AI scribe software for clinical note transcription with the patient, who gave verbal consent to proceed.  History of Present Illness Christopher Rocha is an 83 year old male with COPD and a history of lung cancer who presents for follow-up. Accompanied by his daughter.  He has a history of lung cancer treated with radiation therapy to the right upper lobe, right  lower lobe, and left lower lobe, with the last treatment in March of the previous year.  He is under surveillance, with a CT scan in April showing stable findings and no significant changes in the right upper or bilateral basilar pulmonary regions. A repeat CT scan is scheduled in six months.  He has COPD and uses Symbicort , which he finds effective. Humid weather exacerbates his breathing difficulties, during which he uses his albuterol  rescue inhaler. He was hospitalized for five days in May due to pneumonia but has not had any recent respiratory infections. He is up to date with his pneumonia vaccine and plans to receive a flu shot in the fall.  He experiences chronic bronchitis symptoms, which he attributes to scarring in the lungs. He regularly coughs up mucus and describes it as 'bronchiectasis.' He uses oxygen at home as needed, particularly at night when he experiences shortness of breath, noting that it improves his circulation and warms his extremities.  He does not currently have a nebulizer but uses Mucinex  to help with congestion. His breathing improves with regular use of Symbicort , as it prevents congestion. He also mentions having a hernia that contributes to his respiratory symptoms by blocking airflow.   Allergies  Allergen Reactions   Penicillins Hives and Rash    Did it involve swelling of the face/tongue/throat, SOB, or low BP? No Did it involve sudden or severe rash/hives, skin peeling, or any reaction on the inside of your mouth or nose? No Did you need to seek medical  attention at a hospital or doctor's office? No When did it last happen?      10-15 years If all above answers are NO, may proceed with cephalosporin use.     Breztri  Aerosphere [Budeson-Glycopyrrol-Formoterol ] Swelling    Immunization History  Administered Date(s) Administered   Moderna Sars-Covid-2 Vaccination 09/23/2019, 10/22/2019, 06/18/2020, 01/21/2021    Past Medical History:  Diagnosis Date    Acquired trigger finger of left ring finger 10/18/2021   Acquired trigger finger of right ring finger 10/18/2021   Acute on chronic diastolic heart failure (HCC) 07/02/2018   Adenocarcinoma, lung, right (HCC) 10/27/2019   Allergic rhinitis 11/24/2022   Anemia    iron deficiency    CAD (coronary artery disease) 03/25/2018   COPD (chronic obstructive pulmonary disease) (HCC) 06/14/2018   05/2018 FEV1 60%, DLCO 53%   Coronary artery disease    Dyspnea    Essential hypertension 03/25/2018   History of radiation therapy 06/10/2019   right lung 06/03/2019-06/10/2019  Dr Christopher Rocha   History of radiation therapy    Right Lung- 03/09/22-03/16/22- Dr.James Rocha   History of radiation therapy    Left Lung -01/23/23-01/30/23- Dr. Lynwood Rocha   HLD (hyperlipidemia)    HTN (hypertension)    Hx of CABG 03/25/2018   Iron deficiency anemia 01/12/2021   Mixed dyslipidemia 03/25/2018   Non-small cell lung cancer, left (HCC) 12/27/2022   Pneumonia    2023- last time   Pulmonary nodule    S/P CABG (coronary artery bypass graft)    S/P TAVR (transcatheter aortic valve replacement)    Edwards Sapien XT THV (size 29 mm, model # 9300TFX, serial # [id number])   Severe aortic stenosis    Unspecified atrial fibrillation (HCC) 08/19/2020    Tobacco History: Social History   Tobacco Use  Smoking Status Former   Current packs/day: 0.00   Types: Cigarettes   Quit date: 02/16/1988   Years since quitting: 36.1  Smokeless Tobacco Never   Counseling given: Not Answered   Outpatient Medications Prior to Visit  Medication Sig Dispense Refill   acetaminophen  (TYLENOL ) 325 MG tablet Take 650 mg by mouth every 6 (six) hours as needed for mild pain.     aspirin  EC 81 MG tablet Take 81 mg by mouth every other day.      budesonide -formoterol  (SYMBICORT ) 160-4.5 MCG/ACT inhaler Inhale 2 puffs into the lungs 2 (two) times daily. 1 Inhaler 5   ipratropium (ATROVENT  HFA) 17 MCG/ACT inhaler Inhale 2 puffs  into the lungs every 4 (four) hours as needed for wheezing. 1 each 12   ipratropium (ATROVENT ) 0.03 % nasal spray Place 2 sprays into both nostrils every 12 (twelve) hours. 30 mL 12   budesonide -glycopyrrolate -formoterol  (BREZTRI  AEROSPHERE) 160-9-4.8 MCG/ACT AERO inhaler Inhale 2 puffs into the lungs 2 (two) times daily. (Patient not taking: Reported on 04/09/2024)     ELIQUIS  5 MG TABS tablet Take 2.5 mg by mouth 2 (two) times daily. (Patient not taking: Reported on 04/09/2024)     fluticasone  (FLONASE ) 50 MCG/ACT nasal spray Place 2 sprays into both nostrils daily. (Patient not taking: Reported on 04/09/2024) 16 g 2   ibuprofen (ADVIL) 200 MG tablet Take 200 mg by mouth every 6 (six) hours as needed for mild pain. (Patient not taking: Reported on 04/09/2024)     losartan -hydrochlorothiazide  (HYZAAR) 100-12.5 MG tablet Take 1 tablet by mouth daily. (Patient not taking: Reported on 04/09/2024)     Naphazoline-Glycerin (REDNESS RELIEF OP) Place 1 drop into both eyes daily  as needed (for redness/itching). (Patient not taking: Reported on 04/09/2024)     PROAIR  HFA 108 (90 Base) MCG/ACT inhaler Inhale 2 puffs into the lungs every 4 (four) hours as needed for shortness of breath. (Patient not taking: Reported on 04/09/2024)     rosuvastatin  (CRESTOR ) 10 MG tablet Take 1 tablet by mouth once daily (Patient not taking: Reported on 04/09/2024) 90 tablet 2   triamcinolone  (KENALOG ) 0.1 % Apply 1 application. topically 3 (three) times daily as needed (itching). (Patient not taking: Reported on 04/09/2024)     No facility-administered medications prior to visit.      Review of Systems  Review of Systems  Constitutional: Negative.   HENT:  Positive for congestion.   Respiratory:  Positive for cough. Negative for shortness of breath and wheezing.      Physical Exam  BP 129/64   Pulse 99   Temp 98.1 F (36.7 C)   Ht 5' 11 (1.803 m)   Wt 170 lb 3.2 oz (77.2 kg)   SpO2 96% Comment: RA  BMI 23.74 kg/m  Physical  Exam Constitutional:      Appearance: Normal appearance.  HENT:     Head: Normocephalic and atraumatic.  Cardiovascular:     Rate and Rhythm: Normal rate and regular rhythm.  Pulmonary:     Effort: Pulmonary effort is normal. No respiratory distress.     Breath sounds: Rhonchi present. No wheezing or rales.  Skin:    General: Skin is warm and dry.  Neurological:     General: No focal deficit present.     Mental Status: He is alert and oriented to person, place, and time. Mental status is at baseline.  Psychiatric:        Mood and Affect: Mood normal.        Behavior: Behavior normal.        Thought Content: Thought content normal.        Judgment: Judgment normal.      Lab Results:  CBC    Component Value Date/Time   WBC 11.4 (H) 09/21/2023 0923   WBC 14.5 (H) 11/27/2022 0602   RBC 3.78 (L) 09/21/2023 0923   HGB 11.5 (L) 09/21/2023 0923   HGB 13.3 08/19/2020 1559   HCT 34.3 (L) 09/21/2023 0923   HCT 41.3 08/19/2020 1559   PLT 205 09/21/2023 0923   PLT 170 08/19/2020 1559   MCV 90.7 09/21/2023 0923   MCV 90 08/19/2020 1559   MCH 30.4 09/21/2023 0923   MCHC 33.5 09/21/2023 0923   RDW 13.8 09/21/2023 0923   RDW 14.3 08/19/2020 1559   LYMPHSABS 0.6 (L) 09/21/2023 0923   LYMPHSABS 1.1 08/19/2020 1559   MONOABS 2.6 (H) 09/21/2023 0923   EOSABS 0.5 09/21/2023 0923   EOSABS 0.9 (H) 08/19/2020 1559   BASOSABS 0.1 09/21/2023 0923   BASOSABS 0.0 08/19/2020 1559    BMET    Component Value Date/Time   NA 136 09/21/2023 0923   NA 137 01/12/2021 0000   K 4.0 09/21/2023 0923   CL 98 09/21/2023 0923   CO2 27 09/21/2023 0923   GLUCOSE 142 (H) 09/21/2023 0923   BUN 23 09/21/2023 0923   BUN 21 01/12/2021 0000   CREATININE 1.32 (H) 09/21/2023 0923   CALCIUM  10.0 09/21/2023 0923   GFRNONAA 54 (L) 09/21/2023 0923   GFRAA 69 08/19/2020 1559   GFRAA >60 04/15/2020 1435    BNP    Component Value Date/Time   BNP 175.5 (H) 06/28/2018 1023  ProBNP No results found  for: PROBNP  Imaging: No results found.   Assessment & Plan:   Assessment & Plan Chronic obstructive pulmonary disease (COPD) with chronic bronchitis, emphysema, and bronchiectasis COPD with chronic bronchitis, emphysema, and bronchiectasis. Symptoms include congestion and dyspnea, particularly on humid days. Symbicort  is effective, but congestion occurs without it. Albuterol  is used as needed, primarily on humid days. No recent respiratory infections except for hospitalization in May for pneumonia. No current daytime oxygen use, but nocturnal use as needed.  Patient was given ipratropium-albuterol  nebulizer treatment in office and found it effective at loosening his congestion, we will send DME order in for him to be provided nebulizer machine to use twice daily followed by flutter valve  - Continue Symbicort  twice daily for COPD management. - Order Ipratropium-albuterol  nebulizer for use two to three times daily with a flutter valve to loosen congestion.  History of lung cancer, status post radiation therapy with pulmonary fibrosis/scarring Lung cancer treated with radiation to the right upper lobe, right lower lobe, and left lower lobe. Last radiation was in March of the previous year. Current surveillance with CT scan in April showing no significant changes. Pulmonary fibrosis and scarring present, contributing to bronchiectasis and congestion. - Repeat CT scan in six months for ongoing surveillance.       Almarie LELON Ferrari, NP 04/09/2024  "

## 2024-04-30 ENCOUNTER — Telehealth: Payer: Self-pay | Admitting: *Deleted

## 2024-04-30 NOTE — Telephone Encounter (Signed)
 Christopher Rocha Christopher Rocha; Christopher Rocha Christopher Rocha; Christopher Rocha, Christopher Rocha; Christopher Rocha, Christopher Rocha; 1 other The order is only verbally signed. Can we have provider E-sign it?

## 2024-04-30 NOTE — Telephone Encounter (Signed)
 Nanetta, the patient's daughter called again about the nebulizer.  Was told it was sent to [facility] and given the phone number to call them [phone].  She will call back is she has any more questions or concerns.

## 2024-04-30 NOTE — Telephone Encounter (Signed)
 Should be all set now.

## 2024-04-30 NOTE — Addendum Note (Signed)
 Addended by: HOPE ALMARIE ORN on: 04/30/2024 04:40 PM   Modules accepted: Orders

## 2024-04-30 NOTE — Telephone Encounter (Signed)
 Christopher Rocha, can we please reorder Nebulizer order and E-sign instead of verbally co-signing.

## 2024-04-30 NOTE — Telephone Encounter (Signed)
 Copied from CRM [id number]. Topic: Clinical - Medication Question >> Apr 28, 2024  4:27 PM Devaughn RAMAN wrote: Reason for CRM: Patient daughter Nanetta Cleaves called in regarding nebulizer for patient. Keisha can be contacted at [phone], she stated the patient was expecting to receieve a nebulizer however he has not received it as of yet.  Routing to [facility] pool as the referral for this neb machine was placed and it's documented [facility] received it.

## 2024-05-06 DIAGNOSIS — R0602 Shortness of breath: Secondary | ICD-10-CM | POA: Diagnosis not present

## 2024-05-06 DIAGNOSIS — E119 Type 2 diabetes mellitus without complications: Secondary | ICD-10-CM | POA: Diagnosis not present

## 2024-05-15 DIAGNOSIS — Z6824 Body mass index (BMI) 24.0-24.9, adult: Secondary | ICD-10-CM | POA: Diagnosis not present

## 2024-05-15 DIAGNOSIS — J42 Unspecified chronic bronchitis: Secondary | ICD-10-CM | POA: Diagnosis not present

## 2024-05-18 ENCOUNTER — Inpatient Hospital Stay (HOSPITAL_COMMUNITY)

## 2024-05-18 ENCOUNTER — Other Ambulatory Visit: Payer: Self-pay

## 2024-05-18 ENCOUNTER — Emergency Department (HOSPITAL_COMMUNITY)

## 2024-05-18 ENCOUNTER — Encounter (HOSPITAL_COMMUNITY): Payer: Self-pay

## 2024-05-18 ENCOUNTER — Inpatient Hospital Stay (HOSPITAL_COMMUNITY)
Admission: EM | Admit: 2024-05-18 | Discharge: 2024-06-04 | DRG: 871 | Disposition: E | Attending: Internal Medicine | Admitting: Internal Medicine

## 2024-05-18 DIAGNOSIS — Z452 Encounter for adjustment and management of vascular access device: Secondary | ICD-10-CM | POA: Diagnosis not present

## 2024-05-18 DIAGNOSIS — Z9981 Dependence on supplemental oxygen: Secondary | ICD-10-CM | POA: Diagnosis not present

## 2024-05-18 DIAGNOSIS — J811 Chronic pulmonary edema: Secondary | ICD-10-CM | POA: Diagnosis not present

## 2024-05-18 DIAGNOSIS — J189 Pneumonia, unspecified organism: Secondary | ICD-10-CM | POA: Diagnosis present

## 2024-05-18 DIAGNOSIS — J9622 Acute and chronic respiratory failure with hypercapnia: Secondary | ICD-10-CM | POA: Diagnosis present

## 2024-05-18 DIAGNOSIS — E782 Mixed hyperlipidemia: Secondary | ICD-10-CM | POA: Diagnosis not present

## 2024-05-18 DIAGNOSIS — E43 Unspecified severe protein-calorie malnutrition: Secondary | ICD-10-CM | POA: Diagnosis not present

## 2024-05-18 DIAGNOSIS — Z87891 Personal history of nicotine dependence: Secondary | ICD-10-CM

## 2024-05-18 DIAGNOSIS — Z91199 Patient's noncompliance with other medical treatment and regimen due to unspecified reason: Secondary | ICD-10-CM

## 2024-05-18 DIAGNOSIS — J984 Other disorders of lung: Secondary | ICD-10-CM | POA: Diagnosis not present

## 2024-05-18 DIAGNOSIS — Z832 Family history of diseases of the blood and blood-forming organs and certain disorders involving the immune mechanism: Secondary | ICD-10-CM

## 2024-05-18 DIAGNOSIS — I462 Cardiac arrest due to underlying cardiac condition: Secondary | ICD-10-CM | POA: Diagnosis not present

## 2024-05-18 DIAGNOSIS — Z66 Do not resuscitate: Secondary | ICD-10-CM | POA: Diagnosis not present

## 2024-05-18 DIAGNOSIS — C3481 Malignant neoplasm of overlapping sites of right bronchus and lung: Secondary | ICD-10-CM | POA: Diagnosis present

## 2024-05-18 DIAGNOSIS — Z951 Presence of aortocoronary bypass graft: Secondary | ICD-10-CM

## 2024-05-18 DIAGNOSIS — I4891 Unspecified atrial fibrillation: Secondary | ICD-10-CM | POA: Diagnosis not present

## 2024-05-18 DIAGNOSIS — R092 Respiratory arrest: Secondary | ICD-10-CM

## 2024-05-18 DIAGNOSIS — Z888 Allergy status to other drugs, medicaments and biological substances status: Secondary | ICD-10-CM

## 2024-05-18 DIAGNOSIS — I110 Hypertensive heart disease with heart failure: Secondary | ICD-10-CM | POA: Diagnosis present

## 2024-05-18 DIAGNOSIS — R0603 Acute respiratory distress: Secondary | ICD-10-CM | POA: Diagnosis not present

## 2024-05-18 DIAGNOSIS — I5042 Chronic combined systolic (congestive) and diastolic (congestive) heart failure: Secondary | ICD-10-CM | POA: Diagnosis present

## 2024-05-18 DIAGNOSIS — Z515 Encounter for palliative care: Secondary | ICD-10-CM

## 2024-05-18 DIAGNOSIS — J9601 Acute respiratory failure with hypoxia: Secondary | ICD-10-CM | POA: Diagnosis not present

## 2024-05-18 DIAGNOSIS — R918 Other nonspecific abnormal finding of lung field: Secondary | ICD-10-CM | POA: Diagnosis not present

## 2024-05-18 DIAGNOSIS — I469 Cardiac arrest, cause unspecified: Secondary | ICD-10-CM | POA: Diagnosis not present

## 2024-05-18 DIAGNOSIS — I447 Left bundle-branch block, unspecified: Secondary | ICD-10-CM | POA: Diagnosis present

## 2024-05-18 DIAGNOSIS — Z79899 Other long term (current) drug therapy: Secondary | ICD-10-CM

## 2024-05-18 DIAGNOSIS — R911 Solitary pulmonary nodule: Secondary | ICD-10-CM | POA: Diagnosis not present

## 2024-05-18 DIAGNOSIS — Z7901 Long term (current) use of anticoagulants: Secondary | ICD-10-CM

## 2024-05-18 DIAGNOSIS — R6521 Severe sepsis with septic shock: Secondary | ICD-10-CM | POA: Diagnosis present

## 2024-05-18 DIAGNOSIS — J90 Pleural effusion, not elsewhere classified: Secondary | ICD-10-CM

## 2024-05-18 DIAGNOSIS — Z7951 Long term (current) use of inhaled steroids: Secondary | ICD-10-CM

## 2024-05-18 DIAGNOSIS — I480 Paroxysmal atrial fibrillation: Secondary | ICD-10-CM | POA: Diagnosis not present

## 2024-05-18 DIAGNOSIS — I2489 Other forms of acute ischemic heart disease: Secondary | ICD-10-CM | POA: Diagnosis not present

## 2024-05-18 DIAGNOSIS — I4901 Ventricular fibrillation: Secondary | ICD-10-CM | POA: Diagnosis not present

## 2024-05-18 DIAGNOSIS — Z781 Physical restraint status: Secondary | ICD-10-CM | POA: Diagnosis not present

## 2024-05-18 DIAGNOSIS — Z7189 Other specified counseling: Secondary | ICD-10-CM | POA: Diagnosis not present

## 2024-05-18 DIAGNOSIS — I440 Atrioventricular block, first degree: Secondary | ICD-10-CM | POA: Diagnosis not present

## 2024-05-18 DIAGNOSIS — Z1152 Encounter for screening for COVID-19: Secondary | ICD-10-CM | POA: Diagnosis not present

## 2024-05-18 DIAGNOSIS — B377 Candidal sepsis: Secondary | ICD-10-CM | POA: Diagnosis present

## 2024-05-18 DIAGNOSIS — R404 Transient alteration of awareness: Secondary | ICD-10-CM | POA: Diagnosis not present

## 2024-05-18 DIAGNOSIS — I5020 Unspecified systolic (congestive) heart failure: Secondary | ICD-10-CM | POA: Diagnosis not present

## 2024-05-18 DIAGNOSIS — I2510 Atherosclerotic heart disease of native coronary artery without angina pectoris: Secondary | ICD-10-CM | POA: Diagnosis present

## 2024-05-18 DIAGNOSIS — Z952 Presence of prosthetic heart valve: Secondary | ICD-10-CM

## 2024-05-18 DIAGNOSIS — Z6822 Body mass index (BMI) 22.0-22.9, adult: Secondary | ICD-10-CM

## 2024-05-18 DIAGNOSIS — I4721 Torsades de pointes: Secondary | ICD-10-CM | POA: Diagnosis not present

## 2024-05-18 DIAGNOSIS — Z7982 Long term (current) use of aspirin: Secondary | ICD-10-CM

## 2024-05-18 DIAGNOSIS — J440 Chronic obstructive pulmonary disease with acute lower respiratory infection: Secondary | ICD-10-CM | POA: Diagnosis present

## 2024-05-18 DIAGNOSIS — E875 Hyperkalemia: Secondary | ICD-10-CM | POA: Diagnosis present

## 2024-05-18 DIAGNOSIS — D72829 Elevated white blood cell count, unspecified: Secondary | ICD-10-CM | POA: Diagnosis not present

## 2024-05-18 DIAGNOSIS — C3490 Malignant neoplasm of unspecified part of unspecified bronchus or lung: Secondary | ICD-10-CM | POA: Diagnosis not present

## 2024-05-18 DIAGNOSIS — I499 Cardiac arrhythmia, unspecified: Secondary | ICD-10-CM | POA: Diagnosis not present

## 2024-05-18 DIAGNOSIS — D509 Iron deficiency anemia, unspecified: Secondary | ICD-10-CM | POA: Diagnosis present

## 2024-05-18 DIAGNOSIS — R7881 Bacteremia: Secondary | ICD-10-CM | POA: Insufficient documentation

## 2024-05-18 DIAGNOSIS — A4159 Other Gram-negative sepsis: Principal | ICD-10-CM | POA: Diagnosis present

## 2024-05-18 DIAGNOSIS — I700 Atherosclerosis of aorta: Secondary | ICD-10-CM | POA: Diagnosis not present

## 2024-05-18 DIAGNOSIS — J9621 Acute and chronic respiratory failure with hypoxia: Secondary | ICD-10-CM | POA: Diagnosis present

## 2024-05-18 DIAGNOSIS — J9811 Atelectasis: Secondary | ICD-10-CM | POA: Diagnosis not present

## 2024-05-18 DIAGNOSIS — Z4682 Encounter for fitting and adjustment of non-vascular catheter: Secondary | ICD-10-CM | POA: Diagnosis not present

## 2024-05-18 DIAGNOSIS — Z833 Family history of diabetes mellitus: Secondary | ICD-10-CM

## 2024-05-18 DIAGNOSIS — Z880 Allergy status to penicillin: Secondary | ICD-10-CM

## 2024-05-18 DIAGNOSIS — A499 Bacterial infection, unspecified: Secondary | ICD-10-CM | POA: Diagnosis not present

## 2024-05-18 DIAGNOSIS — Z923 Personal history of irradiation: Secondary | ICD-10-CM

## 2024-05-18 DIAGNOSIS — Z743 Need for continuous supervision: Secondary | ICD-10-CM | POA: Diagnosis not present

## 2024-05-18 DIAGNOSIS — R739 Hyperglycemia, unspecified: Secondary | ICD-10-CM | POA: Diagnosis present

## 2024-05-18 DIAGNOSIS — B49 Unspecified mycosis: Secondary | ICD-10-CM | POA: Insufficient documentation

## 2024-05-18 LAB — GLUCOSE, PLEURAL OR PERITONEAL FLUID
Glucose, Fluid: 240 mg/dL
Glucose, Fluid: 226 mg/dL

## 2024-05-18 LAB — COMPREHENSIVE METABOLIC PANEL WITH GFR
ALT: 23 U/L (ref 0–44)
AST: 32 U/L (ref 15–41)
Albumin: 3.3 g/dL — ABNORMAL LOW (ref 3.5–5.0)
Alkaline Phosphatase: 73 U/L (ref 38–126)
Anion gap: 12 (ref 5–15)
BUN: 28 mg/dL — ABNORMAL HIGH (ref 8–23)
CO2: 27 mmol/L (ref 22–32)
Calcium: 9.8 mg/dL (ref 8.9–10.3)
Chloride: 94 mmol/L — ABNORMAL LOW (ref 98–111)
Creatinine, Ser: 1.31 mg/dL — ABNORMAL HIGH (ref 0.61–1.24)
GFR, Estimated: 54 mL/min — ABNORMAL LOW (ref >60)
Glucose, Bld: 286 mg/dL — ABNORMAL HIGH (ref 70–99)
Potassium: 6.8 mmol/L (ref 3.5–5.1)
Sodium: 133 mmol/L — ABNORMAL LOW (ref 135–145)
Total Bilirubin: 1.2 mg/dL (ref 0.0–1.2)
Total Protein: 6.1 g/dL — ABNORMAL LOW (ref 6.5–8.1)

## 2024-05-18 LAB — I-STAT CHEM 8, ED
BUN: 34 mg/dL — ABNORMAL HIGH (ref 8–23)
Calcium, Ion: 1.32 mmol/L (ref 1.15–1.40)
Chloride: 98 mmol/L (ref 98–111)
Creatinine, Ser: 1.3 mg/dL — ABNORMAL HIGH (ref 0.61–1.24)
Glucose, Bld: 292 mg/dL — ABNORMAL HIGH (ref 70–99)
HCT: 36 % — ABNORMAL LOW (ref 39.0–52.0)
Hemoglobin: 12.2 g/dL — ABNORMAL LOW (ref 13.0–17.0)
Potassium: 6.8 mmol/L (ref 3.5–5.1)
Sodium: 133 mmol/L — ABNORMAL LOW (ref 135–145)
TCO2: 34 mmol/L — ABNORMAL HIGH (ref 22–32)

## 2024-05-18 LAB — LACTATE DEHYDROGENASE, PLEURAL OR PERITONEAL FLUID
LD, Fluid: 57 U/L — ABNORMAL HIGH (ref 3–23)
LD, Fluid: 58 U/L — ABNORMAL HIGH (ref 3–23)

## 2024-05-18 LAB — I-STAT ARTERIAL BLOOD GAS, ED
Acid-Base Excess: 3 mmol/L — ABNORMAL HIGH (ref 0.0–2.0)
Bicarbonate: 30.4 mmol/L — ABNORMAL HIGH (ref 20.0–28.0)
Calcium, Ion: 1.31 mmol/L (ref 1.15–1.40)
HCT: 28 % — ABNORMAL LOW (ref 39.0–52.0)
Hemoglobin: 9.5 g/dL — ABNORMAL LOW (ref 13.0–17.0)
O2 Saturation: 96 %
Patient temperature: 95.8
Potassium: 6 mmol/L — ABNORMAL HIGH (ref 3.5–5.1)
Sodium: 134 mmol/L — ABNORMAL LOW (ref 135–145)
TCO2: 32 mmol/L (ref 22–32)
pCO2 arterial: 57.5 mmHg — ABNORMAL HIGH (ref 32–48)
pH, Arterial: 7.324 — ABNORMAL LOW (ref 7.35–7.45)
pO2, Arterial: 81 mmHg — ABNORMAL LOW (ref 83–108)

## 2024-05-18 LAB — CBC WITH DIFFERENTIAL/PLATELET
Abs Immature Granulocytes: 6.5 K/uL — ABNORMAL HIGH (ref 0.00–0.07)
Basophils Absolute: 0 K/uL (ref 0.0–0.1)
Basophils Relative: 0 %
Eosinophils Absolute: 0 K/uL (ref 0.0–0.5)
Eosinophils Relative: 0 %
HCT: 35.5 % — ABNORMAL LOW (ref 39.0–52.0)
Hemoglobin: 10.5 g/dL — ABNORMAL LOW (ref 13.0–17.0)
Lymphocytes Relative: 1 %
Lymphs Abs: 0.7 K/uL (ref 0.7–4.0)
MCH: 28.2 pg (ref 26.0–34.0)
MCHC: 29.6 g/dL — ABNORMAL LOW (ref 30.0–36.0)
MCV: 95.2 fL (ref 80.0–100.0)
Metamyelocytes Relative: 3 %
Monocytes Absolute: 6.5 K/uL — ABNORMAL HIGH (ref 0.1–1.0)
Monocytes Relative: 10 %
Myelocytes: 6 %
Neutro Abs: 51.6 K/uL — ABNORMAL HIGH (ref 1.7–7.7)
Neutrophils Relative %: 79 %
Platelets: 270 K/uL (ref 150–400)
Promyelocytes Relative: 1 %
RBC: 3.73 MIL/uL — ABNORMAL LOW (ref 4.22–5.81)
RDW: 15 % (ref 11.5–15.5)
WBC: 65.3 K/uL (ref 4.0–10.5)
nRBC: 0 % (ref 0.0–0.2)

## 2024-05-18 LAB — I-STAT CG4 LACTIC ACID, ED
Lactic Acid, Venous: 1.9 mmol/L (ref 0.5–1.9)
Lactic Acid, Venous: 2.5 mmol/L (ref 0.5–1.9)

## 2024-05-18 LAB — BODY FLUID CELL COUNT WITH DIFFERENTIAL
Eos, Fluid: 0 %
Eos, Fluid: 1 %
Eos, Fluid: 0 %
Eos, Fluid: 0 %
Lymphs, Fluid: 32 %
Lymphs, Fluid: 44 %
Lymphs, Fluid: 16 %
Lymphs, Fluid: 19 %
Monocyte-Macrophage-Serous Fluid: 31 % — ABNORMAL LOW (ref 50–90)
Monocyte-Macrophage-Serous Fluid: 48 % — ABNORMAL LOW (ref 50–90)
Monocyte-Macrophage-Serous Fluid: 6 % — ABNORMAL LOW (ref 50–90)
Monocyte-Macrophage-Serous Fluid: 7 % — ABNORMAL LOW (ref 50–90)
Neutrophil Count, Fluid: 20 % (ref 0–25)
Neutrophil Count, Fluid: 24 % (ref 0–25)
Neutrophil Count, Fluid: 75 % — ABNORMAL HIGH (ref 0–25)
Neutrophil Count, Fluid: 77 % — ABNORMAL HIGH (ref 0–25)
Total Nucleated Cell Count, Fluid: 545 uL (ref 0–1000)
Total Nucleated Cell Count, Fluid: 757 uL (ref 0–1000)
Total Nucleated Cell Count, Fluid: 1050 uL — ABNORMAL HIGH (ref 0–1000)
Total Nucleated Cell Count, Fluid: 750 uL (ref 0–1000)

## 2024-05-18 LAB — POCT I-STAT 7, (LYTES, BLD GAS, ICA,H+H)
Acid-Base Excess: 5 mmol/L — ABNORMAL HIGH (ref 0.0–2.0)
Bicarbonate: 28.8 mmol/L — ABNORMAL HIGH (ref 20.0–28.0)
Calcium, Ion: 1.32 mmol/L (ref 1.15–1.40)
HCT: 25 % — ABNORMAL LOW (ref 39.0–52.0)
Hemoglobin: 8.5 g/dL — ABNORMAL LOW (ref 13.0–17.0)
O2 Saturation: 100 %
Patient temperature: 36.3
Potassium: 5 mmol/L (ref 3.5–5.1)
Sodium: 132 mmol/L — ABNORMAL LOW (ref 135–145)
TCO2: 30 mmol/L (ref 22–32)
pCO2 arterial: 38.8 mmHg (ref 32–48)
pH, Arterial: 7.476 — ABNORMAL HIGH (ref 7.35–7.45)
pO2, Arterial: 378 mmHg — ABNORMAL HIGH (ref 83–108)

## 2024-05-18 LAB — BASIC METABOLIC PANEL WITH GFR
Anion gap: 10 (ref 5–15)
BUN: 34 mg/dL — ABNORMAL HIGH (ref 8–23)
CO2: 27 mmol/L (ref 22–32)
Calcium: 9.7 mg/dL (ref 8.9–10.3)
Chloride: 97 mmol/L — ABNORMAL LOW (ref 98–111)
Creatinine, Ser: 1.44 mg/dL — ABNORMAL HIGH (ref 0.61–1.24)
GFR, Estimated: 48 mL/min — ABNORMAL LOW (ref >60)
Glucose, Bld: 210 mg/dL — ABNORMAL HIGH (ref 70–99)
Potassium: 5 mmol/L (ref 3.5–5.1)
Sodium: 134 mmol/L — ABNORMAL LOW (ref 135–145)

## 2024-05-18 LAB — HEMOGLOBIN A1C
Hgb A1c MFr Bld: 6.3 % — ABNORMAL HIGH (ref 4.8–5.6)
Mean Plasma Glucose: 134.11 mg/dL

## 2024-05-18 LAB — TROPONIN I (HIGH SENSITIVITY)
Troponin I (High Sensitivity): 93 ng/L — ABNORMAL HIGH (ref <18)
Troponin I (High Sensitivity): 166 ng/L (ref <18)
Troponin I (High Sensitivity): 175 ng/L (ref <18)

## 2024-05-18 LAB — RESP PANEL BY RT-PCR (RSV, FLU A&B, COVID)  RVPGX2
Influenza A by PCR: NEGATIVE
Influenza B by PCR: NEGATIVE
Resp Syncytial Virus by PCR: NEGATIVE
SARS Coronavirus 2 by RT PCR: NEGATIVE

## 2024-05-18 LAB — GLUCOSE, CAPILLARY
Glucose-Capillary: 139 mg/dL — ABNORMAL HIGH (ref 70–99)
Glucose-Capillary: 145 mg/dL — ABNORMAL HIGH (ref 70–99)
Glucose-Capillary: 187 mg/dL — ABNORMAL HIGH (ref 70–99)
Glucose-Capillary: 207 mg/dL — ABNORMAL HIGH (ref 70–99)
Glucose-Capillary: 213 mg/dL — ABNORMAL HIGH (ref 70–99)

## 2024-05-18 LAB — CBG MONITORING, ED: Glucose-Capillary: 218 mg/dL — ABNORMAL HIGH (ref 70–99)

## 2024-05-18 LAB — PROTEIN, PLEURAL OR PERITONEAL FLUID
Total protein, fluid: <3 g/dL
Total protein, fluid: <3 g/dL

## 2024-05-18 LAB — COOXEMETRY PANEL
Carboxyhemoglobin: 2.7 % — ABNORMAL HIGH (ref 0.5–1.5)
Methemoglobin: <0.7 % (ref 0.0–1.5)
O2 Saturation: 84 %
Total hemoglobin: 8.7 g/dL — ABNORMAL LOW (ref 12.0–16.0)

## 2024-05-18 LAB — BRAIN NATRIURETIC PEPTIDE: B Natriuretic Peptide: 1025.2 pg/mL — ABNORMAL HIGH (ref 0.0–100.0)

## 2024-05-18 LAB — MRSA NEXT GEN BY PCR, NASAL: MRSA by PCR Next Gen: NOT DETECTED

## 2024-05-18 MED ORDER — FENTANYL 2500MCG IN NS 250ML (10MCG/ML) PREMIX INFUSION
0.0000 ug/h | INTRAVENOUS | Status: DC
  Administered 2024-05-18: 50 ug/h via INTRAVENOUS
  Administered 2024-05-19 (×2): 125 ug/h via INTRAVENOUS
  Administered 2024-05-20: 100 ug/h via INTRAVENOUS
  Filled 2024-05-18 (×4): qty 250

## 2024-05-18 MED ORDER — POLYETHYLENE GLYCOL 3350 17 G PO PACK: 17.0000 g | PACK | Freq: Every day | ORAL | Status: DC | PRN

## 2024-05-18 MED ORDER — SODIUM CHLORIDE 0.9 % IV SOLN
2.0000 g | Freq: Two times a day (BID) | INTRAVENOUS | Status: DC
  Administered 2024-05-18 – 2024-05-21 (×6): 2 g via INTRAVENOUS
  Filled 2024-05-18 (×6): qty 12.5

## 2024-05-18 MED ORDER — SODIUM ZIRCONIUM CYCLOSILICATE 10 G PO PACK
10.0000 g | PACK | Freq: Once | ORAL | Status: AC
  Administered 2024-05-18: 10 g
  Filled 2024-05-18: qty 1

## 2024-05-18 MED ORDER — CHLORHEXIDINE GLUCONATE CLOTH 2 % EX PADS
6.0000 | MEDICATED_PAD | Freq: Every day | CUTANEOUS | Status: DC
  Administered 2024-05-18 – 2024-05-20 (×3): 6 via TOPICAL

## 2024-05-18 MED ORDER — INSULIN ASPART 100 UNIT/ML IV SOLN
10.0000 [IU] | Freq: Once | INTRAVENOUS | Status: AC
  Administered 2024-05-18: 10 [IU] via INTRAVENOUS

## 2024-05-18 MED ORDER — NOREPINEPHRINE 4 MG/250ML-% IV SOLN
INTRAVENOUS | Status: AC
  Administered 2024-05-18: 3 mg
  Filled 2024-05-18: qty 250

## 2024-05-18 MED ORDER — CALCIUM GLUCONATE-NACL 2-0.675 GM/100ML-% IV SOLN
2.0000 g | INTRAVENOUS | Status: AC
  Administered 2024-05-18: 2000 mg via INTRAVENOUS
  Filled 2024-05-18 (×2): qty 100

## 2024-05-18 MED ORDER — ENOXAPARIN SODIUM 40 MG/0.4ML IJ SOSY
40.0000 mg | PREFILLED_SYRINGE | INTRAMUSCULAR | Status: DC
  Administered 2024-05-18 – 2024-05-19 (×2): 40 mg via SUBCUTANEOUS
  Filled 2024-05-18 (×2): qty 0.4

## 2024-05-18 MED ORDER — PROPOFOL 1000 MG/100ML IV EMUL
0.0000 ug/kg/min | INTRAVENOUS | Status: DC
  Administered 2024-05-18 – 2024-05-19 (×3): 30 ug/kg/min via INTRAVENOUS
  Administered 2024-05-19: 10 ug/kg/min via INTRAVENOUS
  Filled 2024-05-18 (×4): qty 100

## 2024-05-18 MED ORDER — FAMOTIDINE 20 MG PO TABS
20.0000 mg | ORAL_TABLET | Freq: Every day | ORAL | Status: DC
  Administered 2024-05-18 – 2024-05-19 (×2): 20 mg
  Filled 2024-05-18 (×2): qty 1

## 2024-05-18 MED ORDER — PROPOFOL 1000 MG/100ML IV EMUL
INTRAVENOUS | Status: AC
  Administered 2024-05-18: 20 ug/kg/min via INTRAVENOUS
  Filled 2024-05-18: qty 100

## 2024-05-18 MED ORDER — FUROSEMIDE 10 MG/ML IJ SOLN
20.0000 mg | Freq: Once | INTRAMUSCULAR | Status: AC
  Administered 2024-05-18: 20 mg via INTRAVENOUS
  Filled 2024-05-18: qty 2

## 2024-05-18 MED ORDER — INSULIN ASPART 100 UNIT/ML IJ SOLN
0.0000 [IU] | INTRAMUSCULAR | Status: DC
  Administered 2024-05-18: 5 [IU] via SUBCUTANEOUS
  Administered 2024-05-18: 2 [IU] via SUBCUTANEOUS
  Administered 2024-05-18: 5 [IU] via SUBCUTANEOUS
  Administered 2024-05-18: 3 [IU] via SUBCUTANEOUS
  Administered 2024-05-18 – 2024-05-19 (×3): 2 [IU] via SUBCUTANEOUS
  Administered 2024-05-19: 3 [IU] via SUBCUTANEOUS
  Administered 2024-05-19 (×3): 2 [IU] via SUBCUTANEOUS
  Administered 2024-05-20: 3 [IU] via SUBCUTANEOUS
  Administered 2024-05-20 (×3): 2 [IU] via SUBCUTANEOUS
  Administered 2024-05-20: 3 [IU] via SUBCUTANEOUS
  Administered 2024-05-20 – 2024-05-21 (×2): 2 [IU] via SUBCUTANEOUS
  Administered 2024-05-21: 3 [IU] via SUBCUTANEOUS
  Administered 2024-05-21: 2 [IU] via SUBCUTANEOUS

## 2024-05-18 MED ORDER — DEXTROSE 50 % IV SOLN
1.0000 | Freq: Once | INTRAVENOUS | Status: AC
Start: 2024-05-18 — End: 2024-05-18
  Administered 2024-05-18: 50 mL via INTRAVENOUS
  Filled 2024-05-18: qty 50

## 2024-05-18 MED ORDER — VANCOMYCIN HCL 1500 MG/300ML IV SOLN
1500.0000 mg | Freq: Once | INTRAVENOUS | Status: AC
  Administered 2024-05-18: 1500 mg via INTRAVENOUS
  Filled 2024-05-18: qty 300

## 2024-05-18 MED ORDER — HYDROCORTISONE SOD SUC (PF) 100 MG IJ SOLR
100.0000 mg | Freq: Three times a day (TID) | INTRAMUSCULAR | Status: DC
  Administered 2024-05-18 – 2024-05-21 (×10): 100 mg via INTRAVENOUS
  Filled 2024-05-18 (×10): qty 2

## 2024-05-18 MED ORDER — VANCOMYCIN HCL IN DEXTROSE 1-5 GM/200ML-% IV SOLN
1000.0000 mg | INTRAVENOUS | Status: DC
  Administered 2024-05-19: 1000 mg via INTRAVENOUS
  Filled 2024-05-18: qty 200

## 2024-05-18 MED ORDER — SODIUM CHLORIDE 0.9 % IV SOLN: 1.0000 g | INTRAVENOUS | Status: DC

## 2024-05-18 MED ORDER — ETOMIDATE 2 MG/ML IV SOLN
20.0000 mg | Freq: Once | INTRAVENOUS | Status: AC
  Administered 2024-05-18: 20 mg via INTRAVENOUS

## 2024-05-18 MED ORDER — DOCUSATE SODIUM 50 MG/5ML PO LIQD: 100.0000 mg | Freq: Two times a day (BID) | ORAL | Status: DC | PRN

## 2024-05-18 MED ORDER — SODIUM CHLORIDE 0.9 % IV SOLN
2.0000 g | INTRAVENOUS | Status: DC
  Filled 2024-05-18: qty 20

## 2024-05-18 MED ORDER — SODIUM CHLORIDE 0.9 % IV SOLN
500.0000 mg | INTRAVENOUS | Status: DC
  Administered 2024-05-18 – 2024-05-19 (×2): 500 mg via INTRAVENOUS
  Filled 2024-05-18 (×2): qty 5

## 2024-05-18 MED ORDER — SODIUM CHLORIDE 0.9 % IV SOLN
2.0000 g | Freq: Two times a day (BID) | INTRAVENOUS | Status: DC
  Administered 2024-05-18: 2 g via INTRAVENOUS
  Filled 2024-05-18: qty 12.5

## 2024-05-18 MED ORDER — SUCCINYLCHOLINE CHLORIDE 200 MG/10ML IV SOSY
120.0000 mg | PREFILLED_SYRINGE | Freq: Once | INTRAVENOUS | Status: AC
  Administered 2024-05-18: 120 mg via INTRAVENOUS

## 2024-05-18 NOTE — Progress Notes (Signed)
 Pharmacy Antibiotic Note  Christopher Rocha is a 83 y.o. male admitted on 05/18/2024 with Acute Hypoxic / Hypercapnic Respiratory Failure and  pneumonia.  Pharmacy has been consulted for vancomycin  / cefepime  dosing.  Scr 1.30 (baseline ~ 0.9) w/ elevated K. Estimated CrCl = 45.9. WBC 65.3. Intubated in ED.   Plan: Vancomycin  1500 mg x1 followed by 1000 mg q24h for AUC of 437  Cefepime  2 grams q12h  Follow cultures, clinical progression   Height: 5' 10.98 (180.3 cm) Weight: 77 kg (169 lb 12.1 oz) IBW/kg (Calculated) : 75.26  Temp (24hrs), Avg:96.3 F (35.7 C), Min:94.9 F (34.9 C), Max:97.2 F (36.2 C)  Recent Labs  Lab 05/18/24 0722 05/18/24 0740  WBC 65.3*  --   CREATININE 1.31* 1.30*  LATICACIDVEN  --  1.9    Estimated Creatinine Clearance: 45.9 mL/min (A) (by C-G formula based on SCr of 1.3 mg/dL (H)).    Allergies  Allergen Reactions   Penicillins Hives and Rash    Did it involve swelling of the face/tongue/throat, SOB, or low BP? No Did it involve sudden or severe rash/hives, skin peeling, or any reaction on the inside of your mouth or nose? No Did you need to seek medical attention at a hospital or doctor's office? No When did it last happen?      10-15 years If all above answers are NO, may proceed with cephalosporin use.     Breztri  Aerosphere [Budeson-Glycopyrrol-Formoterol ] Swelling    Antimicrobials this admission: Vancomycin  9/14 >> c Cefepime  9/14 >> c Azithromycin  9/14 >>   Microbiology results: 9/14 BCx: pending  Thank you for allowing pharmacy to be a part of this patient's care.  Christopher Rocha 05/18/2024 8:59 AM

## 2024-05-18 NOTE — Plan of Care (Signed)
  Problem: Coping: Goal: Ability to adjust to condition or change in health will improve Outcome: Progressing   Problem: Tissue Perfusion: Goal: Adequacy of tissue perfusion will improve Outcome: Progressing   Problem: Clinical Measurements: Goal: Cardiovascular complication will be avoided Outcome: Progressing   Problem: Coping: Goal: Level of anxiety will decrease Outcome: Progressing   Problem: Elimination: Goal: Will not experience complications related to urinary retention Outcome: Progressing   Problem: Pain Managment: Goal: General experience of comfort will improve and/or be controlled Outcome: Progressing   Problem: Safety: Goal: Ability to remain free from injury will improve Outcome: Progressing

## 2024-05-18 NOTE — Procedures (Signed)
 Bronchoscopy Procedure Note  Christopher Rocha  [id number]  Jul 02, 1941  Date:05/18/24  Time:12:52 PM   Provider Performing:Kortland Nichols C Claudene   Procedure(s):  Flexible bronchoscopy with bronchial alveolar lavage [phone]  Indication(s) Nonresolving PNA, hx cancer  Consent Risks of the procedure as well as the alternatives and risks of each were explained to the patient and/or caregiver.  Consent for the procedure was obtained and is signed in the bedside chart  Anesthesia In place for ETT   Time Out Verified patient identification, verified procedure, site/side was marked, verified correct patient position, special equipment/implants available, medications/allergies/relevant history reviewed, required imaging and test results available.   Sterile Technique Usual hand hygiene, masks, gowns, and gloves were used   Procedure Description Bronchoscope advanced through endotracheal tube and into airway.  Airways were examined down to subsegmental level with findings noted below.   Following diagnostic evaluation, BAL done in RLL and RUL each separately. RLL return with mostly clear plug-filled fluid.  RUL similar but less plugs.  Findings Abnormal narrowing apical segment RUL c/w changes seen on CT, no overlying abnormal tissue Some focal mucopurulent secretions seen in bronchus intermedius near area of infiltrate on CT Left looked ok   Complications/Tolerance None; patient tolerated the procedure well. Chest X-ray is needed post procedure.   EBL Minimal   Specimen(s) BAL RUL BAL RLL

## 2024-05-18 NOTE — H&P (Signed)
 NAME:  Christopher Rocha, MRN:  [id number], DOB:  Nov 26, 1940, LOS: 0 ADMISSION DATE:  05/18/2024, CONSULTATION DATE:  05/18/2024 REFERRING MD:  Dr. Dasie - EDP, CHIEF COMPLAINT:  Acute hypoxic respiratory failure    History of Present Illness:  Christopher Rocha is a 83 year old male with a past medical history significant for multiple lung cancer treated with radiation, HTN, HLD, CAD s/p CABG, valvular heart disease including severe aortic stenosis s/p TAVR who, and anemia who presented to the ED via EMS from home with complaints of acute shortness of breath.  On ED arrival patient was seen in acute respiratory distress with hypoxia despite application of nonrebreather prompting emergent intubation.  Additional vital signs significant for mild hypothermia, tachypnea, and mild hypotension. Lab work significant for NA 133, K6.8, glucose 286, creatinine 1.31, GFR 54, albumin 3.3, lactic acid 1.9, WBC severely elevated at 16.3 with neutrophils of 51.6 and immature granulocyte 5 3.  PCCM consult for additional management and admission.  Pertinent  Medical History  multiple lung cancer treated with radiation, HTN, HLD, CAD s/p CABG, valvular heart disease including severe aortic stenosis s/p TAVR, and anemia   Significant Hospital Events: Including procedures, antibiotic start and stop dates in addition to other pertinent events   9/14 presented for acute shortness of breath admission workup concerning for volume overload and/or pneumonia  Interim History / Subjective:  Intubated and sedated  Objective    Blood pressure (!) 100/48, pulse 90, resp. rate 20, height 5' 10.98 (1.803 m), weight 77 kg, SpO2 100%.    Vent Mode: PRVC FiO2 (%):  [100 %] 100 % Set Rate:  [20 bmp] 20 bmp Vt Set:  [580 mL-600 mL] 600 mL PEEP:  [5 cmH20] 5 cmH20 Plateau Pressure:  [24 cmH20] 24 cmH20  No intake or output data in the 24 hours ending 05/18/24 0821 Filed Weights   05/18/24 0728  Weight: 77 kg    Examination: General: Acute on chronic ill-appearing deconditioned elderly male lying in bed on mechanical ventilation in no acute distress  HEENT: ETT, MM pink/moist, PERRL,  Neuro: Sedated on ventilator CV: s1s2 regular rate and rhythm, no murmur, rubs, or gallops,  PULM: Bilateral rhonchi worse on the right, tolerating ventilator, no increased work of breathing GI: soft, bowel sounds active in all 4 quadrants, non-tender, non-distended Extremities: warm/dry, no edema  Skin: no rashes or lesions   Resolved problem list   Assessment and Plan  Acute on chronic hypoxic and hypercapnic respiratory failure  -Intermittent use of supplemental oxygen at night Moderate bilateral pleural effusions with mild diffuse interstitial edema History of multiple lung cancers History of COPD -3 separate occasions of lung cancer treated with stereotactic radiation.  Chest x-ray on admission with observed right upper lobe lung mass correlating with known cancer but additional nodular density within the right middle lobe concerning for possible new site of disease or possible infection P: Continue ventilator support with lung protective strategies  Wean PEEP and FiO2 for sats greater than 90%. Head of bed elevated 30 degrees. Plateau pressures less than 30 cm H20.  Follow intermittent chest x-ray and ABG.   SAT/SBT as tolerated, mentation preclude extubation  Ensure adequate pulmonary hygiene  Follow cultures  VAP bundle in place  PAD protocol Empiric Zosyn and vancomycin  Will plan to proceed with bilateral thora's for fluid sampling   At risk acute kidney injury  Hyperkalemia P: Follow renal function  Monitor urine output Trend Bmet Avoid nephrotoxins Ensure adequate renal perfusion  IV hydration  Severe leukocytosis - WBC 65.3 on admission with 51.6 Neutra feels and 6.50 absolute immature granulocytes P: Broad-spectrum antibiotics as above Smear review ordered Trend WBC  CAD s/p  CABG Valvular heart disease including severe aortic stenosis s/p TAVR Essential hypertension  Hyperlipidemia  History of atrial fibrillation anticoagulated on Eliquis  - Medication reconciliation not yet completed but it appears patients home on occasion includes aspirin , Eliquis , HCTZ, Crestor  P: Continuous telemetry Check echocardiogram Check Coox once central access obtained Optimize electrolytes Hold anticoagulation today pending procedures consider heparin  drip tomorrow  Hyperglycemia P: SSI  CBG check q4  CBG goal 140-180  Iron deficiency anemia P: Trend CBC Transfuse per protocol Hemoglobin goal greater than 7  Labs   CBC: Recent Labs  Lab 05/18/24 0722 05/18/24 0740  WBC 65.3*  --   NEUTROABS 51.6*  --   HGB 10.5* 12.2*  HCT 35.5* 36.0*  MCV 95.2  --   PLT 270  --     Basic Metabolic Panel: Recent Labs  Lab 05/18/24 0722 05/18/24 0740  NA 133* 133*  K 6.8* 6.8*  CL 94* 98  CO2 27  --   GLUCOSE 286* 292*  BUN 28* 34*  CREATININE 1.31* 1.30*  CALCIUM  9.8  --    GFR: Estimated Creatinine Clearance: 45.9 mL/min (A) (by C-G formula based on SCr of 1.3 mg/dL (H)). Recent Labs  Lab 05/18/24 0722 05/18/24 0740  WBC 65.3*  --   LATICACIDVEN  --  1.9    Liver Function Tests: Recent Labs  Lab 05/18/24 0722  AST 32  ALT 23  ALKPHOS 73  BILITOT 1.2  PROT 6.1*  ALBUMIN 3.3*   No results for input(s): LIPASE, AMYLASE in the last 168 hours. No results for input(s): AMMONIA in the last 168 hours.  ABG    Component Value Date/Time   PHART 7.399 06/28/2018 1023   PCO2ART 41.4 06/28/2018 1023   PO2ART 68.8 (L) 06/28/2018 1023   HCO3 25.1 06/28/2018 1023   TCO2 34 (H) 05/18/2024 0740   ACIDBASEDEF 1.0 05/08/2018 1615   O2SAT 93.6 06/28/2018 1023     Coagulation Profile: No results for input(s): INR, PROTIME in the last 168 hours.  Cardiac Enzymes: No results for input(s): CKTOTAL, CKMB, CKMBINDEX, TROPONINI in the last  168 hours.  HbA1C: Hgb A1c MFr Bld  Date/Time Value Ref Range Status  06/28/2018 10:21 AM 6.0 (H) 4.8 - 5.6 % Final    Comment:    (NOTE) Pre diabetes:          5.7%-6.4% Diabetes:              >6.4% Glycemic control for   <7.0% adults with diabetes     CBG: No results for input(s): GLUCAP in the last 168 hours.  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), Allergic rhinitis (11/24/2022), Anemia, CAD (coronary artery disease) (03/25/2018), COPD (chronic obstructive pulmonary disease) (HCC) (06/14/2018), Coronary artery disease, Dyspnea, Essential hypertension (03/25/2018), History of radiation therapy (06/10/2019), History of radiation therapy, History of radiation therapy, HLD (hyperlipidemia), HTN (hypertension), CABG (03/25/2018), Iron deficiency anemia (01/12/2021), Mixed dyslipidemia (03/25/2018), Non-small cell lung cancer, 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   Surgical History:   Past Surgical History:  Procedure Laterality Date   BONE MARROW ASPIRATION     BRONCHIAL BIOPSY  02/13/2022   Procedure: BRONCHIAL BIOPSIES;  Surgeon: Shelah Lamar RAMAN, MD;  Location: [facility] ENDOSCOPY;  Service: Pulmonary;;   BRONCHIAL BIOPSY  11/27/2022   Procedure: BRONCHIAL BIOPSIES;  Surgeon: Shelah Lamar RAMAN, MD;  Location: [facility] ENDOSCOPY;  Service: Pulmonary;;   BRONCHIAL BRUSHINGS  02/13/2022   Procedure: BRONCHIAL BRUSHINGS;  Surgeon: Shelah Lamar RAMAN, MD;  Location: [facility] ENDOSCOPY;  Service: Pulmonary;;   BRONCHIAL BRUSHINGS  11/27/2022    Procedure: BRONCHIAL BRUSHINGS;  Surgeon: Shelah Lamar RAMAN, MD;  Location: [facility] ENDOSCOPY;  Service: Pulmonary;;   BRONCHIAL NEEDLE ASPIRATION BIOPSY  02/13/2022   Procedure: BRONCHIAL NEEDLE ASPIRATION BIOPSIES;  Surgeon: Shelah Lamar RAMAN, MD;  Location: [facility] ENDOSCOPY;  Service: Pulmonary;;   BRONCHIAL NEEDLE ASPIRATION BIOPSY  11/27/2022   Procedure: BRONCHIAL NEEDLE ASPIRATION BIOPSIES;  Surgeon: Shelah Lamar RAMAN, MD;  Location: [facility] ENDOSCOPY;  Service: Pulmonary;;   BRONCHIAL WASHINGS  02/13/2022   Procedure: BRONCHIAL WASHINGS;  Surgeon: Shelah Lamar RAMAN, MD;  Location: [facility] ENDOSCOPY;  Service: Pulmonary;;   BRONCHIAL WASHINGS  11/27/2022   Procedure: BRONCHIAL WASHINGS;  Surgeon: Shelah Lamar RAMAN, MD;  Location: [facility] ENDOSCOPY;  Service: Pulmonary;;   CARDIAC CATHETERIZATION     CARDIAC SURGERY     COLONOSCOPY     CORONARY ARTERY BYPASS GRAFT  2011   EYE SURGERY     bilateral cataracts   FIBEROPTIC BRONCHOSCOPY  04/08/2019       FIDUCIAL MARKER PLACEMENT  02/13/2022   Procedure: FIDUCIAL MARKER PLACEMENT;  Surgeon: Shelah Lamar RAMAN, MD;  Location: [facility] ENDOSCOPY;  Service: Pulmonary;;   FIDUCIAL MARKER PLACEMENT  11/27/2022   Procedure: FIDUCIAL MARKER PLACEMENT;  Surgeon: Shelah Lamar RAMAN, MD;  Location: [facility] ENDOSCOPY;  Service: Pulmonary;;   RIGHT/LEFT HEART CATH AND CORONARY/GRAFT ANGIOGRAPHY N/A 05/08/2018   Procedure: RIGHT/LEFT HEART CATH AND CORONARY/GRAFT ANGIOGRAPHY;  Surgeon: Darron Deatrice LABOR, MD;  Location: [facility] INVASIVE CV LAB;  Service: Cardiovascular;  Laterality: N/A;   TEE WITHOUT CARDIOVERSION  07/02/2018   Procedure: TRANSESOPHAGEAL ECHOCARDIOGRAM (TEE);  Surgeon: Wonda Sharper, MD;  Location: [facility] OR;  Service: Open Heart Surgery;;   TRANSCATHETER AORTIC VALVE REPLACEMENT, TRANSAPICAL N/A 07/02/2018   Procedure: TRANSCATHETER AORTIC VALVE REPLACEMENT, TRANSAPICAL;  Surgeon: Wonda Sharper, MD;  Location: [facility] OR;  Service: Open Heart Surgery;  Laterality: N/A;   VIDEO BRONCHOSCOPY  Bilateral 05/31/2018   Procedure: VIDEO BRONCHOSCOPY WITH FLUORO;  Surgeon: Jude Harden GAILS, MD;  Location: [facility] ENDOSCOPY;  Service: Cardiopulmonary;  Laterality: Bilateral;   VIDEO BRONCHOSCOPY WITH ENDOBRONCHIAL NAVIGATION N/A 04/08/2019   Procedure: VIDEO BRONCHOSCOPY WITH ENDOBRONCHIAL NAVIGATION;  Surgeon: Jude Harden GAILS, MD;  Location: [facility] OR;  Service: Thoracic;  Laterality: N/A;   VIDEO BRONCHOSCOPY WITH ENDOBRONCHIAL ULTRASOUND N/A 04/08/2019   Procedure: Video Bronchoscopy With Endobronchial Ultrasound;  Surgeon: Jude Harden GAILS, MD;  Location: [facility] OR;  Service: Thoracic;  Laterality: N/A;   WISDOM TOOTH EXTRACTION       Social History:   reports that he quit smoking about 36 years ago. His smoking use included cigarettes. He has never used smokeless tobacco. He reports that he does not currently use alcohol. He reports that he does not use drugs.   Family History:  His family history includes Clotting disorder in his father; Diabetes in his mother.   Allergies Allergies  Allergen Reactions  Penicillins Hives and Rash    Did it involve swelling of the face/tongue/throat, SOB, or low BP? No Did it involve sudden or severe rash/hives, skin peeling, or any reaction on the inside of your mouth or nose? No Did you need to seek medical attention at a hospital or doctor's office? No When did it last happen?      10-15 years If all above answers are NO, may proceed with cephalosporin use.     Breztri  Aerosphere [Budeson-Glycopyrrol-Formoterol ] Swelling     Home Medications  Prior to Admission medications   Medication Sig Start Date End Date Taking? Authorizing Provider  azithromycin  (ZITHROMAX ) 250 MG tablet Take 250 mg by mouth as directed. 05/06/24  Yes [provider]  clindamycin (CLEOCIN) 300 MG capsule Take 300 mg by mouth 3 (three) times daily. 05/15/24  Yes [provider]  doxycycline (VIBRAMYCIN) 100 MG capsule Take 100 mg by mouth 2 (two) times daily.  05/06/24  Yes [provider]  predniSONE  (DELTASONE ) 20 MG tablet Take 20 mg by mouth daily. 05/06/24  Yes [provider]  acetaminophen  (TYLENOL ) 325 MG tablet Take 650 mg by mouth every 6 (six) hours as needed for mild pain.    [provider]  aspirin  EC 81 MG tablet Take 81 mg by mouth every other day.     [provider]  budesonide -formoterol  (SYMBICORT ) 160-4.5 MCG/ACT inhaler Inhale 2 puffs into the lungs 2 (two) times daily. 10/27/19   Jude Harden GAILS, MD  ELIQUIS  5 MG TABS tablet Take 2.5 mg by mouth 2 (two) times daily. Patient not taking: Reported on 04/09/2024 01/26/24   [provider]  fluticasone  (FLONASE ) 50 MCG/ACT nasal spray Place 2 sprays into both nostrils daily. Patient not taking: Reported on 04/09/2024 11/24/22   Byrum, Robert S, MD  ibuprofen (ADVIL) 200 MG tablet Take 200 mg by mouth every 6 (six) hours as needed for mild pain. Patient not taking: Reported on 04/09/2024    [provider]  ipratropium (ATROVENT  HFA) 17 MCG/ACT inhaler Inhale 2 puffs into the lungs every 4 (four) hours as needed for wheezing. 12/27/22   Byrum, Robert S, MD  ipratropium (ATROVENT ) 0.03 % nasal spray Place 2 sprays into both nostrils every 12 (twelve) hours. 01/12/23   Shelah Lamar RAMAN, MD  ipratropium-albuterol  (DUONEB) 0.5-2.5 (3) MG/3ML SOLN Take 3 mLs by nebulization every 6 (six) hours as needed. 04/09/24   Hope Almarie ORN, NP  losartan -hydrochlorothiazide  (HYZAAR) 100-12.5 MG tablet Take 1 tablet by mouth daily. Patient not taking: Reported on 04/09/2024    [provider]  Naphazoline-Glycerin (REDNESS RELIEF OP) Place 1 drop into both eyes daily as needed (for redness/itching). Patient not taking: Reported on 04/09/2024    [provider]  PROAIR  HFA 108 (90 Base) MCG/ACT inhaler Inhale 2 puffs into the lungs every 4 (four) hours as needed for shortness of breath. Patient not taking: Reported on 04/09/2024 01/23/19   [provider]  rosuvastatin  (CRESTOR ) 10 MG tablet Take 1 tablet by mouth once daily Patient not taking: Reported on 04/09/2024 11/19/20   Revankar, Rajan R, MD  triamcinolone  (KENALOG ) 0.1 % Apply 1 application. topically 3 (three) times daily as needed (itching). Patient not taking: Reported on 04/09/2024 08/04/20   [provider]     Critical care time:   CRITICAL CARE Performed by: Cullan Launer D. Harris   Total critical care time: 42 minutes  Critical care time was exclusive of separately billable procedures and treating other  patients.  Critical care was necessary to treat or prevent imminent or life-threatening deterioration.  Critical care was time spent personally by me on the following activities: development of treatment plan with patient and/or surrogate as well as nursing, discussions with consultants, evaluation of patient's response to treatment, examination of patient, obtaining history from patient or surrogate, ordering and performing treatments and interventions, ordering and review of laboratory studies, ordering and review of radiographic studies, pulse oximetry and re-evaluation of patient's condition.  Fanny Agan D. Harris, NP-C [facility] Personal contact information can be found on Amion  If no contact or response made please call [phone] 05/18/2024, 9:34 AM

## 2024-05-18 NOTE — ED Provider Notes (Signed)
 [place] EMERGENCY DEPARTMENT AT [facility] Provider Note   CSN: [id number] Arrival date & time: 05/18/24  0720     Patient presents with: Respiratory Distress   Christopher Rocha is a 83 y.o. male.   83 year old male presents with respiratory distress.  Patient has history of COPD and is on 4 L of oxygen chronically.  Patient recently treated for pneumonia.  Per EMS, family called due to patient having increased dyspnea.  Patient given albuterol  10 mg total along with Solu-Medrol 125 and Atrovent  and transported here.  All the history obtained was via EMS and review of the old record.       Prior to Admission medications   Medication Sig Start Date End Date Taking? Authorizing Provider  acetaminophen  (TYLENOL ) 325 MG tablet Take 650 mg by mouth every 6 (six) hours as needed for mild pain.    [provider]  aspirin  EC 81 MG tablet Take 81 mg by mouth every other day.     [provider]  budesonide -formoterol  (SYMBICORT ) 160-4.5 MCG/ACT inhaler Inhale 2 puffs into the lungs 2 (two) times daily. 10/27/19   Jude Harden GAILS, MD  ELIQUIS  5 MG TABS tablet Take 2.5 mg by mouth 2 (two) times daily. Patient not taking: Reported on 04/09/2024 01/26/24   [provider]  fluticasone  (FLONASE ) 50 MCG/ACT nasal spray Place 2 sprays into both nostrils daily. Patient not taking: Reported on 04/09/2024 11/24/22   Byrum, Robert S, MD  ibuprofen (ADVIL) 200 MG tablet Take 200 mg by mouth every 6 (six) hours as needed for mild pain. Patient not taking: Reported on 04/09/2024    [provider]  ipratropium (ATROVENT  HFA) 17 MCG/ACT inhaler Inhale 2 puffs into the lungs every 4 (four) hours as needed for wheezing. 12/27/22   Byrum, Robert S, MD  ipratropium (ATROVENT ) 0.03 % nasal spray Place 2 sprays into both nostrils every 12 (twelve) hours. 01/12/23   Shelah Lamar RAMAN, MD  ipratropium-albuterol  (DUONEB) 0.5-2.5 (3) MG/3ML SOLN Take 3 mLs by nebulization  every 6 (six) hours as needed. 04/09/24   Hope Almarie ORN, NP  losartan -hydrochlorothiazide  (HYZAAR) 100-12.5 MG tablet Take 1 tablet by mouth daily. Patient not taking: Reported on 04/09/2024    [provider]  Naphazoline-Glycerin (REDNESS RELIEF OP) Place 1 drop into both eyes daily as needed (for redness/itching). Patient not taking: Reported on 04/09/2024    [provider]  PROAIR  HFA 108 (90 Base) MCG/ACT inhaler Inhale 2 puffs into the lungs every 4 (four) hours as needed for shortness of breath. Patient not taking: Reported on 04/09/2024 01/23/19   [provider]  rosuvastatin  (CRESTOR ) 10 MG tablet Take 1 tablet by mouth once daily Patient not taking: Reported on 04/09/2024 11/19/20   Revankar, Jennifer SAUNDERS, MD  triamcinolone  (KENALOG ) 0.1 % Apply 1 application. topically 3 (three) times daily as needed (itching). Patient not taking: Reported on 04/09/2024 08/04/20   [provider]    Allergies: Penicillins and Breztri  aerosphere [budeson-glycopyrrol-formoterol ]    Review of Systems  Unable to perform ROS: Acuity of condition    Updated Vital Signs BP (!) 161/78 (BP Location: Right Arm)   Pulse (!) 120   Resp (!) 42   Ht 1.803 m (5' 11)   Wt 77 kg   SpO2 100%   BMI 23.68 kg/m   Physical Exam Vitals and nursing note reviewed.  Constitutional:      General: He is not in acute distress.    Appearance:  Normal appearance. He is well-developed. He is not toxic-appearing.  HENT:     Head: Normocephalic and atraumatic.  Eyes:     General: Lids are normal.     Conjunctiva/sclera: Conjunctivae normal.     Pupils: Pupils are equal, round, and reactive to light.  Neck:     Thyroid : No thyroid  mass.     Trachea: No tracheal deviation.  Cardiovascular:     Rate and Rhythm: Regular rhythm. Tachycardia present.     Heart sounds: Normal heart sounds. No murmur heard.    No gallop.  Pulmonary:     Effort: Tachypnea and respiratory distress present.      Breath sounds: No stridor. Examination of the right-upper field reveals rhonchi. Examination of the left-upper field reveals rhonchi. Rhonchi present. No decreased breath sounds, wheezing or rales.  Abdominal:     General: There is no distension.     Palpations: Abdomen is soft.     Tenderness: There is no abdominal tenderness. There is no rebound.  Musculoskeletal:        General: No tenderness. Normal range of motion.     Cervical back: Normal range of motion and neck supple.  Skin:    General: Skin is warm and dry.     Findings: No abrasion or rash.  Neurological:     Mental Status: He is unresponsive.     GCS: GCS eye subscore is 4. GCS verbal subscore is 3. GCS motor subscore is 3.     Cranial Nerves: No cranial nerve deficit.     Sensory: No sensory deficit.     Motor: No tremor.  Psychiatric:        Attention and Perception: He is inattentive.     (all labs ordered are listed, but only abnormal results are displayed) Labs Reviewed  RESP PANEL BY RT-PCR (RSV, FLU A&B, COVID)  [id number]  CULTURE, BLOOD (ROUTINE X 2)  CULTURE, BLOOD (ROUTINE X 2)  CBC WITH DIFFERENTIAL/PLATELET  COMPREHENSIVE METABOLIC PANEL WITH GFR  I-STAT CHEM 8, ED  I-STAT ARTERIAL BLOOD GAS, ED  I-STAT CG4 LACTIC ACID, ED    EKG: None  Radiology: No results found.   Procedure Name: Intubation Date/Time: 05/18/2024 7:44 AM  Performed by: Dasie Faden, MDPre-anesthesia Checklist: Patient identified, Patient being monitored, Emergency Drugs available, Timeout performed and Suction available Oxygen Delivery Method: Non-rebreather mask Preoxygenation: Pre-oxygenation with 100% oxygen Induction Type: Rapid sequence Ventilation: Mask ventilation without difficulty Laryngoscope Size: Glidescope and 1 Grade View: Grade I Tube size: 7.5 mm Number of attempts: 1 Airway Equipment and Method: Video-laryngoscopy Placement Confirmation: ETT inserted through vocal cords under direct vision, CO2 detector  and Breath sounds checked- equal and bilateral Secured at: 24 cm    .Sedation  Date/Time: 05/18/2024 7:49 AM  Performed by: Dasie Faden, MD Authorized by: Dasie Faden, MD   Universal protocol:    Immediately prior to procedure, a time out was called: yes   Pre-sedation assessment:    Time since last food or drink:  Unknown   NPO status caution: unable to specify NPO status     ASA classification: class 3 - patient with severe systemic disease     Mallampati score:  I - soft palate, uvula, fauces, pillars visible   Pre-sedation assessments completed and reviewed: airway patency   A pre-sedation assessment was completed prior to the start of the procedure Immediate pre-procedure details:    Reassessment: Patient reassessed immediately prior to procedure     Reviewed: vital signs  Procedure details (see MAR for exact dosages):    Sedation:  Propofol    Intended level of sedation: deep   Total Provider sedation time (minutes):  10 Post-procedure details:   A post-sedation assessment was completed following the completion of the procedure.    Medications Ordered in the ED  etomidate  (AMIDATE ) injection 20 mg (has no administration in time range)  succinylcholine  (ANECTINE ) syringe 120 mg (has no administration in time range)                                    Medical Decision Making Amount and/or Complexity of Data Reviewed Labs: ordered. Radiology: ordered. ECG/medicine tests: ordered.  Risk Prescription drug management. Decision regarding hospitalization.   Patient intubated as above.  Given sedation with propofol  and fentanyl .  Doses adjusted.  Chest x-ray was reviewed and suspicious for CHF versus pneumonia.  Given patient recently on antibiotics, will empirically start on IV antibiotics but due to concern for CHF will not start with IV fluid bolus..  Patient's ET tube appears to be 1-1/2 cm above the carina.  Vent settings given.  Patient's EKG shows A-fib.   Unchanged from prior.  Bedside i-STAT 8 shows hyperglycemia with some hyperkalemia.  Patient has no EKG is consistent with that.  Suspect this is hemolysis.  Repeat test has been ordered.  Case discussed with critical care physician who will come and admit the patient.  CRITICAL CARE Performed by: Curtistine ONEIDA Dawn Total critical care time: 45 minutes Critical care time was exclusive of separately billable procedures and treating other patients. Critical care was necessary to treat or prevent imminent or life-threatening deterioration. Critical care was time spent personally by me on the following activities: development of treatment plan with patient and/or surrogate as well as nursing, discussions with consultants, evaluation of patient's response to treatment, examination of patient, obtaining history from patient or surrogate, ordering and performing treatments and interventions, ordering and review of laboratory studies, ordering and review of radiographic studies, pulse oximetry and re-evaluation of patient's condition.      Final diagnoses:  None    ED Discharge Orders     None          Dawn Curtistine, MD 05/20/24 1658

## 2024-05-18 NOTE — Procedures (Signed)
 Thoracentesis  Procedure Note  Christopher Rocha  [id number]  06-13-1941  Date:05/18/24  Time:12:51 PM   Provider Performing:Christopher Rocha Christopher Rocha   Procedure: Thoracentesis with imaging guidance (67444)  Indication(s) Pleural Effusion  Consent Risks of the procedure as well as the alternatives and risks of each were explained to the patient and/or caregiver.  Consent for the procedure was obtained and is signed in the bedside chart  Anesthesia Topical only with 1% lidocaine     Time Out Verified patient identification, verified procedure, site/side was marked, verified correct patient position, special equipment/implants available, medications/allergies/relevant history reviewed, required imaging and test results available.   Sterile Technique Maximal sterile technique including full sterile barrier drape, hand hygiene, sterile gown, sterile gloves, mask, hair covering, sterile ultrasound probe cover (if used).  Procedure Description Ultrasound was used to identify appropriate pleural anatomy for placement and overlying skin marked.  Area of drainage cleaned and draped in sterile fashion. Lidocaine  was used to anesthetize the skin and subcutaneous tissue.  I drained left space while Christopher Rocha drained right.  Total of 2L removed.  No immediate complications.  Specimens sent separately.   Complications/Tolerance None; patient tolerated the procedure well. Chest X-ray is ordered to confirm no post-procedural complication.   EBL Minimal   Specimen(s) Pleural fluid

## 2024-05-18 NOTE — ED Triage Notes (Signed)
 Pt to ED via [facility] from home. Pt's family called EMS d/t shortness of breath. In route, pt stopped responding. Pt has labored breathing, not tracking w/eyes and not responding verbally. MD and RT to bedside on arrival. Pt received albuterol  and solumedrol.

## 2024-05-18 NOTE — Procedures (Signed)
 Central Venous Catheter Insertion Procedure Note  Christopher Rocha  [id number]  01/13/1941  Date:05/18/24  Time:1:28 PM   Provider Performing:Keyasha Miah D. Harris   Procedure: Insertion of Non-tunneled Central Venous [phone]) with US  guidance (23062)     Indication(s) Medication administration and Difficult access  Consent Risks of the procedure as well as the alternatives and risks of each were explained to the patient and/or caregiver.  Consent for the procedure was obtained and is signed in the bedside chart  Anesthesia Topical only with 1% lidocaine    Timeout Verified patient identification, verified procedure, site/side was marked, verified correct patient position, special equipment/implants available, medications/allergies/relevant history reviewed, required imaging and test results available.  Sterile Technique Maximal sterile technique including full sterile barrier drape, hand hygiene, sterile gown, sterile gloves, mask, hair covering, sterile ultrasound probe cover (if used).  Procedure Description Area of catheter insertion was cleaned with chlorhexidine  and draped in sterile fashion.  With real-time ultrasound guidance a central venous catheter was placed into the left internal jugular vein. Nonpulsatile blood flow and easy flushing noted in all ports.  The catheter was sutured in place and sterile dressing applied.  Complications/Tolerance None; patient tolerated the procedure well. Chest X-ray is ordered to verify placement for internal jugular or subclavian cannulation.   Chest x-ray is not ordered for femoral cannulation.  EBL Minimal  Specimen(s) None  Victorian Gunn D. Harris, NP-C [facility] Personal contact information can be found on Amion  If no contact or response made please call [phone] 05/18/2024, 1:28 PM

## 2024-05-18 NOTE — Procedures (Signed)
 Arterial Catheter Insertion Procedure Note  Christopher Rocha  [id number]  10-Dec-1940  Date:05/18/24  Time:2:30 PM    Provider Performing: Toribio [name]    Procedure: Insertion of Arterial Line (63379) with US  guidance (23062)   Indication(s) Blood pressure monitoring and/or need for frequent ABGs  Consent Risks of the procedure as well as the alternatives and risks of each were explained to the patient and/or caregiver.  Consent for the procedure was obtained and is signed in the bedside chart  Anesthesia None   Time Out Verified patient identification, verified procedure, site/side was marked, verified correct patient position, special equipment/implants available, medications/allergies/relevant history reviewed, required imaging and test results available.   Sterile Technique Maximal sterile technique including full sterile barrier drape, hand hygiene, sterile gown, sterile gloves, mask, hair covering, sterile ultrasound probe cover (if used).   Procedure Description Area of catheter insertion was cleaned with chlorhexidine  and draped in sterile fashion. With real-time ultrasound guidance an arterial catheter was placed into the left radial artery.  Appropriate arterial tracings confirmed on monitor.     Complications/Tolerance MAP markedly lower on A-line, may be in worse shock state than realized   EBL Minimal   Specimen(s) None

## 2024-05-18 NOTE — Procedures (Signed)
 Thoracentesis  Procedure Note  Jermie Hippe  [id number]  February 17, 1941  Date:05/18/24  Time:1:26 PM   Provider Performing:Lousie Calico D. Harris   Procedure: Thoracentesis with imaging guidance (67444)  Indication(s) Pleural Effusion  Consent Risks of the procedure as well as the alternatives and risks of each were explained to the patient and/or caregiver.  Consent for the procedure was obtained and is signed in the bedside chart  Anesthesia Topical only with 1% lidocaine     Time Out Verified patient identification, verified procedure, site/side was marked, verified correct patient position, special equipment/implants available, medications/allergies/relevant history reviewed, required imaging and test results available.   Sterile Technique Maximal sterile technique including full sterile barrier drape, hand hygiene, sterile gown, sterile gloves, mask, hair covering, sterile ultrasound probe cover (if used).  Procedure Description Ultrasound was used to identify appropriate pleural anatomy for placement and overlying skin marked.  Area of drainage cleaned and draped in sterile fashion. Lidocaine  was used to anesthetize the skin and subcutaneous tissue.   I drained left space while Dr. Claudene drained right.  Total of 2L removed.  No immediate complications.  Specimens sent separately.   Complications/Tolerance None; patient tolerated the procedure well. Chest X-ray is ordered to confirm no post-procedural complication.   EBL Minimal   Specimen(s) Pleural fluid   Orville Mena D. Harris, NP-C  Pulmonary & Critical Care Personal contact information can be found on Amion  If no contact or response made please call [phone] 05/18/2024, 1:27 PM

## 2024-05-18 NOTE — ED Notes (Signed)
 Admitting at bedside

## 2024-05-18 NOTE — Progress Notes (Signed)
 Pt transported from ED 26 to CT then to 4M on vent without complications. Report given to 4M RT.

## 2024-05-18 NOTE — Progress Notes (Addendum)
 05/18/2024 Tele strip pretty ugly but looking back at previous EKGs QRS morphology actually unchanged.  ABG looks okay.  Trop flat.  CVP 15, IVC big and no resp variation.  BNP 1k, previously 175 6 years ago.  Pleural fluid looks benign.  Coox and wide pulse pressure argue current shock state remains distributive.  Has gotten enough fluids.  Will check lactate to help guide further management.  Do think at some point pushing additional fluid removal will be helpful once shock improves.  Recheck K is good after insulin   ESR, Pct, smear, uric acid, LDH still pending.  Already on hydrocortisone  but if thinking more COP type may need to consider more glucocorticoid with methylpred; Dewald will look at tomorrow.  Will put in for AM iron panel as well.  Rolan Sharps MD PCCM

## 2024-05-18 NOTE — ED Notes (Signed)
 Pt in CT at this time.

## 2024-05-19 ENCOUNTER — Inpatient Hospital Stay (HOSPITAL_COMMUNITY)

## 2024-05-19 DIAGNOSIS — I5020 Unspecified systolic (congestive) heart failure: Secondary | ICD-10-CM

## 2024-05-19 DIAGNOSIS — I469 Cardiac arrest, cause unspecified: Secondary | ICD-10-CM

## 2024-05-19 DIAGNOSIS — Z7189 Other specified counseling: Secondary | ICD-10-CM

## 2024-05-19 DIAGNOSIS — R0603 Acute respiratory distress: Secondary | ICD-10-CM

## 2024-05-19 DIAGNOSIS — Z952 Presence of prosthetic heart valve: Secondary | ICD-10-CM

## 2024-05-19 LAB — BLOOD CULTURE ID PANEL (REFLEXED) - BCID2
A.calcoaceticus-baumannii: NOT DETECTED
Bacteroides fragilis: NOT DETECTED
CTX-M ESBL: NOT DETECTED
Candida albicans: DETECTED — AB
Candida auris: NOT DETECTED
Candida glabrata: NOT DETECTED
Candida krusei: NOT DETECTED
Candida parapsilosis: NOT DETECTED
Candida tropicalis: NOT DETECTED
Carbapenem resist OXA 48 LIKE: NOT DETECTED
Carbapenem resistance IMP: NOT DETECTED
Carbapenem resistance KPC: NOT DETECTED
Carbapenem resistance NDM: NOT DETECTED
Carbapenem resistance VIM: NOT DETECTED
Cryptococcus neoformans/gattii: NOT DETECTED
Enterobacter cloacae complex: NOT DETECTED
Enterobacterales: DETECTED — AB
Enterococcus Faecium: NOT DETECTED
Enterococcus faecalis: NOT DETECTED
Escherichia coli: NOT DETECTED
Haemophilus influenzae: NOT DETECTED
Klebsiella aerogenes: NOT DETECTED
Klebsiella oxytoca: NOT DETECTED
Klebsiella pneumoniae: NOT DETECTED
Listeria monocytogenes: NOT DETECTED
Neisseria meningitidis: NOT DETECTED
Proteus species: NOT DETECTED
Pseudomonas aeruginosa: NOT DETECTED
Salmonella species: NOT DETECTED
Serratia marcescens: NOT DETECTED
Staphylococcus aureus (BCID): NOT DETECTED
Staphylococcus epidermidis: NOT DETECTED
Staphylococcus lugdunensis: NOT DETECTED
Staphylococcus species: NOT DETECTED
Stenotrophomonas maltophilia: NOT DETECTED
Streptococcus agalactiae: NOT DETECTED
Streptococcus pneumoniae: NOT DETECTED
Streptococcus pyogenes: NOT DETECTED
Streptococcus species: NOT DETECTED

## 2024-05-19 LAB — GLUCOSE, CAPILLARY
Glucose-Capillary: 132 mg/dL — ABNORMAL HIGH (ref 70–99)
Glucose-Capillary: 137 mg/dL — ABNORMAL HIGH (ref 70–99)
Glucose-Capillary: 139 mg/dL — ABNORMAL HIGH (ref 70–99)
Glucose-Capillary: 140 mg/dL — ABNORMAL HIGH (ref 70–99)
Glucose-Capillary: 142 mg/dL — ABNORMAL HIGH (ref 70–99)
Glucose-Capillary: 182 mg/dL — ABNORMAL HIGH (ref 70–99)
Glucose-Capillary: 197 mg/dL — ABNORMAL HIGH (ref 70–99)

## 2024-05-19 LAB — ECHOCARDIOGRAM COMPLETE
AR max vel: 1.53 cm2
AV Area VTI: 1.63 cm2
AV Area mean vel: 1.35 cm2
AV Mean grad: 4.3 mmHg
AV Peak grad: 8.9 mmHg
Ao pk vel: 1.49 m/s
Area-P 1/2: 2.73 cm2
Calc EF: 44.8 %
Height: 70.984 in
MV VTI: 1.52 cm2
S' Lateral: 4.3 cm
Single Plane A2C EF: 57.2 %
Single Plane A4C EF: 33.7 %
Weight: 2716.07 [oz_av]

## 2024-05-19 LAB — TECHNOLOGIST SMEAR REVIEW: Plt Morphology: NORMAL

## 2024-05-19 LAB — POCT I-STAT 7, (LYTES, BLD GAS, ICA,H+H)
Acid-Base Excess: 1 mmol/L (ref 0.0–2.0)
Bicarbonate: 24.2 mmol/L (ref 20.0–28.0)
Calcium, Ion: 1.19 mmol/L (ref 1.15–1.40)
HCT: 26 % — ABNORMAL LOW (ref 39.0–52.0)
Hemoglobin: 8.8 g/dL — ABNORMAL LOW (ref 13.0–17.0)
O2 Saturation: 100 %
Patient temperature: 36.9
Potassium: 3.7 mmol/L (ref 3.5–5.1)
Sodium: 134 mmol/L — ABNORMAL LOW (ref 135–145)
TCO2: 25 mmol/L (ref 22–32)
pCO2 arterial: 33.4 mmHg (ref 32–48)
pH, Arterial: 7.468 — ABNORMAL HIGH (ref 7.35–7.45)
pO2, Arterial: 183 mmHg — ABNORMAL HIGH (ref 83–108)

## 2024-05-19 LAB — FERRITIN: Ferritin: 148 ng/mL (ref 24–336)

## 2024-05-19 LAB — BASIC METABOLIC PANEL WITH GFR
Anion gap: 13 (ref 5–15)
Anion gap: 14 (ref 5–15)
BUN: 34 mg/dL — ABNORMAL HIGH (ref 8–23)
BUN: 39 mg/dL — ABNORMAL HIGH (ref 8–23)
CO2: 25 mmol/L (ref 22–32)
CO2: 23 mmol/L (ref 22–32)
Calcium: 9.6 mg/dL (ref 8.9–10.3)
Calcium: 9.1 mg/dL (ref 8.9–10.3)
Chloride: 98 mmol/L (ref 98–111)
Chloride: 96 mmol/L — ABNORMAL LOW (ref 98–111)
Creatinine, Ser: 1.34 mg/dL — ABNORMAL HIGH (ref 0.61–1.24)
Creatinine, Ser: 1.49 mg/dL — ABNORMAL HIGH (ref 0.61–1.24)
GFR, Estimated: 53 mL/min — ABNORMAL LOW (ref >60)
GFR, Estimated: 46 mL/min — ABNORMAL LOW (ref >60)
Glucose, Bld: 143 mg/dL — ABNORMAL HIGH (ref 70–99)
Glucose, Bld: 238 mg/dL — ABNORMAL HIGH (ref 70–99)
Potassium: 4.7 mmol/L (ref 3.5–5.1)
Potassium: 3.9 mmol/L (ref 3.5–5.1)
Sodium: 136 mmol/L (ref 135–145)
Sodium: 133 mmol/L — ABNORMAL LOW (ref 135–145)

## 2024-05-19 LAB — DIFFERENTIAL
Abs Immature Granulocytes: 4.05 K/uL — ABNORMAL HIGH (ref 0.00–0.07)
Basophils Absolute: 0.3 K/uL — ABNORMAL HIGH (ref 0.0–0.1)
Basophils Relative: 0 %
Eosinophils Absolute: 0 K/uL (ref 0.0–0.5)
Eosinophils Relative: 0 %
Immature Granulocytes: 6 %
Lymphocytes Relative: 1 %
Lymphs Abs: 0.4 K/uL — ABNORMAL LOW (ref 0.7–4.0)
Monocytes Absolute: 11.2 K/uL — ABNORMAL HIGH (ref 0.1–1.0)
Monocytes Relative: 16 %
Neutro Abs: 55.2 K/uL — ABNORMAL HIGH (ref 1.7–7.7)
Neutrophils Relative %: 77 %
Smear Review: NORMAL
nRBC: 0 /100{WBCs}

## 2024-05-19 LAB — CBC
HCT: 26.8 % — ABNORMAL LOW (ref 39.0–52.0)
HCT: 30.1 % — ABNORMAL LOW (ref 39.0–52.0)
Hemoglobin: 8.6 g/dL — ABNORMAL LOW (ref 13.0–17.0)
Hemoglobin: 9.4 g/dL — ABNORMAL LOW (ref 13.0–17.0)
MCH: 28.5 pg (ref 26.0–34.0)
MCH: 28.4 pg (ref 26.0–34.0)
MCHC: 32.1 g/dL (ref 30.0–36.0)
MCHC: 31.2 g/dL (ref 30.0–36.0)
MCV: 88.7 fL (ref 80.0–100.0)
MCV: 90.9 fL (ref 80.0–100.0)
Platelets: 215 K/uL (ref 150–400)
Platelets: 243 K/uL (ref 150–400)
RBC: 3.02 MIL/uL — ABNORMAL LOW (ref 4.22–5.81)
RBC: 3.31 MIL/uL — ABNORMAL LOW (ref 4.22–5.81)
RDW: 15.1 % (ref 11.5–15.5)
RDW: 15.2 % (ref 11.5–15.5)
WBC: 71.1 K/uL (ref 4.0–10.5)
WBC: 78.8 K/uL (ref 4.0–10.5)
nRBC: 0 % (ref 0.0–0.2)
nRBC: 0 % (ref 0.0–0.2)

## 2024-05-19 LAB — LACTATE DEHYDROGENASE: LDH: 217 U/L — ABNORMAL HIGH (ref 98–192)

## 2024-05-19 LAB — MAGNESIUM
Magnesium: 1.8 mg/dL (ref 1.7–2.4)
Magnesium: 4.7 mg/dL — ABNORMAL HIGH (ref 1.7–2.4)

## 2024-05-19 LAB — LACTIC ACID, PLASMA
Lactic Acid, Venous: 1 mmol/L (ref 0.5–1.9)
Lactic Acid, Venous: 3.4 mmol/L (ref 0.5–1.9)

## 2024-05-19 LAB — SEDIMENTATION RATE: Sed Rate: 50 mm/h — ABNORMAL HIGH (ref 0–16)

## 2024-05-19 LAB — PHOSPHORUS: Phosphorus: 3.4 mg/dL (ref 2.5–4.6)

## 2024-05-19 LAB — IRON AND TIBC
Iron: 24 ug/dL — ABNORMAL LOW (ref 45–182)
Saturation Ratios: 9 % — ABNORMAL LOW (ref 17.9–39.5)
TIBC: 269 ug/dL (ref 250–450)
UIBC: 245 ug/dL

## 2024-05-19 LAB — URIC ACID: Uric Acid, Serum: 9.3 mg/dL — ABNORMAL HIGH (ref 3.7–8.6)

## 2024-05-19 LAB — TRIGLYCERIDES, BODY FLUIDS
Triglycerides, Fluid: 17 mg/dL
Triglycerides, Fluid: 17 mg/dL

## 2024-05-19 LAB — TROPONIN I (HIGH SENSITIVITY): Troponin I (High Sensitivity): 195 ng/L (ref <18)

## 2024-05-19 LAB — PROCALCITONIN: Procalcitonin: 0.72 ng/mL

## 2024-05-19 LAB — TRIGLYCERIDES: Triglycerides: 73 mg/dL (ref <150)

## 2024-05-19 MED ORDER — PERFLUTREN LIPID MICROSPHERE
1.0000 mL | INTRAVENOUS | Status: AC | PRN
  Administered 2024-05-19: 2 mL via INTRAVENOUS

## 2024-05-19 MED ORDER — ACETAMINOPHEN 160 MG/5ML PO SOLN: 650.0000 mg | ORAL | Status: DC | PRN

## 2024-05-19 MED ORDER — SODIUM CHLORIDE 0.9 % IV SOLN
100.0000 mg | INTRAVENOUS | Status: DC
  Administered 2024-05-19 – 2024-05-20 (×2): 100 mg via INTRAVENOUS
  Filled 2024-05-19 (×3): qty 5

## 2024-05-19 MED ORDER — NOREPINEPHRINE 4 MG/250ML-% IV SOLN
0.0000 ug/min | INTRAVENOUS | Status: DC
  Administered 2024-05-19: 1 ug/min via INTRAVENOUS
  Administered 2024-05-20: 3 ug/min via INTRAVENOUS
  Administered 2024-05-20: 1 ug/min via INTRAVENOUS
  Administered 2024-05-21: 2 ug/min via INTRAVENOUS
  Filled 2024-05-19 (×2): qty 250

## 2024-05-19 MED ORDER — ENOXAPARIN SODIUM 80 MG/0.8ML IJ SOSY
80.0000 mg | PREFILLED_SYRINGE | Freq: Two times a day (BID) | INTRAMUSCULAR | Status: DC
  Administered 2024-05-19 – 2024-05-21 (×4): 80 mg via SUBCUTANEOUS
  Filled 2024-05-19 (×4): qty 0.8

## 2024-05-19 MED ORDER — MAGNESIUM SULFATE 2 GM/50ML IV SOLN
INTRAVENOUS | Status: AC
  Filled 2024-05-19: qty 50

## 2024-05-19 MED ORDER — MAGNESIUM SULFATE 2 GM/50ML IV SOLN
2.0000 g | Freq: Once | INTRAVENOUS | Status: AC
  Administered 2024-05-19: 2 g via INTRAVENOUS

## 2024-05-19 MED ORDER — ACETAMINOPHEN 325 MG PO TABS: 650.0000 mg | ORAL_TABLET | ORAL | Status: DC | PRN

## 2024-05-19 MED ORDER — AMIODARONE HCL IN DEXTROSE 360-4.14 MG/200ML-% IV SOLN
INTRAVENOUS | Status: AC
  Filled 2024-05-19: qty 200

## 2024-05-19 MED ORDER — ACETAMINOPHEN 650 MG RE SUPP: 650.0000 mg | RECTAL | Status: DC | PRN

## 2024-05-19 NOTE — Plan of Care (Signed)
   Problem: Coping: Goal: Ability to adjust to condition or change in health will improve Outcome: Progressing   Problem: Fluid Volume: Goal: Ability to maintain a balanced intake and output will improve Outcome: Progressing   Problem: Health Behavior/Discharge Planning: Goal: Ability to identify and utilize available resources and services will improve Outcome: Progressing

## 2024-05-19 NOTE — TOC CM/SW Note (Signed)
 Transition of Care [facility]) - Inpatient Brief Assessment   Patient Details  Name: Christopher Rocha MRN: [id number] Date of Birth: Jan 22, 1941  [facility]) CM/SW Contact:    Tom-Johnson, Harvest Muskrat, RN Phone Number: 05/19/2024, 11:01 AM   Clinical Narrative:  Patient presented to the ED with acute Shortness of Breath. Patient was intubated and sedated d/t acute Respiratory Distress with Hypoxia also noted with mild Hypothermia, Tachypnea, and mild Hypotension. Currently on IV abx and IV Solucortex.   Patient not Medically ready for discharge.  CM will continue to follow as patient progresses with care towards discharge.             Transition of Care Asessment:

## 2024-05-19 NOTE — Code Documentation (Signed)
 Code blue: First one VF 3 min before ROSC: chest compressions, Epi 1 mg, Amiodarone  150 mg, Defib 150 J x1 Second VF and torsades for 9 min before ROSC: chest compressions, Defib 200 J x1, Epi 1 mg, Amiodarone  150 mg, Lidocaine  100 mg, Mg 4 gm IV, Bicarb x1  See code sheet Daughter was notified DNR now

## 2024-05-19 NOTE — Progress Notes (Signed)
   05/19/24 2140  Spiritual Encounters  Type of Visit Initial  Care provided to: Pt not available  Referral source Code page  Reason for visit Code  OnCall Visit No   Chaplain responded to a code blue page.  If a chaplain is requested someone will respond.  Carley Birmingham [facility]  [phone]

## 2024-05-19 NOTE — IPAL (Signed)
  Interdisciplinary Goals of Care Family Meeting   Date carried out: 05/19/2024  Location of the meeting: Bedside  Member's involved: Bedside Registered Nurse, Family Member or next of kin, and Other: PA-C  Durable Power of Attorney or acting medical decision maker: Daughter Christopher Rocha    Discussion: We discussed goals of care for [employer] .  Patient had cardiac arrest w/ ROSC about 10 minutes. Spoke with daughter over phone and agreed if patient loses pulse again would not want to resume compressions or shocks and would like to make DNR moving forward. Family arrived to bedside and updated. Patient currently stable on low dose levo and amio drip. Agreed to keep DNR and continue supportive care. If patient decompensates overnight will update daughter and family may consider comfort at that time.  Code status:   Code Status: Limited: Do not attempt resuscitation (DNR) -DNR-LIMITED -Do Not Intubate/DNI    Disposition: Continue current acute care  Time spent for the meeting: 35 minutes    Christopher JONETTA Cedar, PA-C  05/19/2024, 11:33 PM

## 2024-05-19 NOTE — Progress Notes (Signed)
 PHARMACY - PHYSICIAN COMMUNICATION CRITICAL VALUE ALERT - BLOOD CULTURE IDENTIFICATION (BCID)  Christopher Rocha is an 83 y.o. male who presented to [facility] on 05/18/2024 with a chief complaint of sepsis  Assessment:  Pt with hx of lung CA/TAVR who was admitted for sepsis. BCID came back with candida albicans and enterobacterales. We will optimize his abx/antifungal.   Name of physician (or Provider) Contacted: Corean Reese, PA  Current antibiotics: Vanc/cefepime /azith  Changes to prescribed antibiotics recommended:  Dc vanc/azith Continue cefepime  2g IV q12 Add Micafungin  100mg  IV q24  Results for orders placed or performed during the hospital encounter of 05/18/24  Blood Culture ID Panel (Reflexed) (Collected: 05/18/2024  7:22 AM)  Result Value Ref Range   Enterococcus faecalis NOT DETECTED NOT DETECTED   Enterococcus Faecium NOT DETECTED NOT DETECTED   Listeria monocytogenes NOT DETECTED NOT DETECTED   Staphylococcus species NOT DETECTED NOT DETECTED   Staphylococcus aureus (BCID) NOT DETECTED NOT DETECTED   Staphylococcus epidermidis NOT DETECTED NOT DETECTED   Staphylococcus lugdunensis NOT DETECTED NOT DETECTED   Streptococcus species NOT DETECTED NOT DETECTED   Streptococcus agalactiae NOT DETECTED NOT DETECTED   Streptococcus pneumoniae NOT DETECTED NOT DETECTED   Streptococcus pyogenes NOT DETECTED NOT DETECTED   A.calcoaceticus-baumannii NOT DETECTED NOT DETECTED   Bacteroides fragilis NOT DETECTED NOT DETECTED   Enterobacterales DETECTED (A) NOT DETECTED   Enterobacter cloacae complex NOT DETECTED NOT DETECTED   Escherichia coli NOT DETECTED NOT DETECTED   Klebsiella aerogenes NOT DETECTED NOT DETECTED   Klebsiella oxytoca NOT DETECTED NOT DETECTED   Klebsiella pneumoniae NOT DETECTED NOT DETECTED   Proteus species NOT DETECTED NOT DETECTED   Salmonella species NOT DETECTED NOT DETECTED   Serratia marcescens NOT DETECTED NOT DETECTED   Haemophilus  influenzae NOT DETECTED NOT DETECTED   Neisseria meningitidis NOT DETECTED NOT DETECTED   Pseudomonas aeruginosa NOT DETECTED NOT DETECTED   Stenotrophomonas maltophilia NOT DETECTED NOT DETECTED   Candida albicans DETECTED (A) NOT DETECTED   Candida auris NOT DETECTED NOT DETECTED   Candida glabrata NOT DETECTED NOT DETECTED   Candida krusei NOT DETECTED NOT DETECTED   Candida parapsilosis NOT DETECTED NOT DETECTED   Candida tropicalis NOT DETECTED NOT DETECTED   Cryptococcus neoformans/gattii NOT DETECTED NOT DETECTED   CTX-M ESBL NOT DETECTED NOT DETECTED   Carbapenem resistance IMP NOT DETECTED NOT DETECTED   Carbapenem resistance KPC NOT DETECTED NOT DETECTED   Carbapenem resistance NDM NOT DETECTED NOT DETECTED   Carbapenem resist OXA 48 LIKE NOT DETECTED NOT DETECTED   Carbapenem resistance VIM NOT DETECTED NOT DETECTED   Sergio Batch, PharmD, BCIDP, AAHIVP, CPP Infectious Disease Pharmacist 05/19/2024 3:19 PM

## 2024-05-19 NOTE — Progress Notes (Signed)
 PHARMACY - ANTICOAGULATION CONSULT NOTE  Pharmacy Consult for Lovenox  Indication: atrial fibrillation  Allergies  Allergen Reactions   Penicillins Hives and Rash    Did it involve swelling of the face/tongue/throat, SOB, or low BP? No Did it involve sudden or severe rash/hives, skin peeling, or any reaction on the inside of your mouth or nose? No Did you need to seek medical attention at a hospital or doctor's office? No When did it last happen?      10-15 years If all above answers are NO, may proceed with cephalosporin use.     Breztri  Aerosphere [Budeson-Glycopyrrol-Formoterol ] Swelling    Patient Measurements: Height: 5' 10.98 (180.3 cm) Weight: 77 kg (169 lb 12.1 oz) IBW/kg (Calculated) : 75.26 HEPARIN  DW (KG): 77  Vital Signs: Pulse Rate: 72 (09/15 1100)  Labs: Recent Labs    05/18/24 0722 05/18/24 0740 05/18/24 0743 05/18/24 0835 05/18/24 1348 05/18/24 1409 05/18/24 1619 05/19/24 0919  HGB 10.5* 12.2*  --  9.5*  --  8.5*  --  8.6*  HCT 35.5* 36.0*  --  28.0*  --  25.0*  --  26.8*  PLT 270  --   --   --   --   --   --  215  CREATININE 1.31* 1.30*  --   --  1.44*  --   --  1.34*  TROPONINIHS  --   --  93*  --  166*  --  175*  --     Estimated Creatinine Clearance: 44.5 mL/min (A) (by C-G formula based on SCr of 1.34 mg/dL (H)).   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   Unknown   budesonide -formoterol  (SYMBICORT ) 160-4.5 MCG/ACT inhaler Inhale 2 puffs into the lungs 2 (two) times daily. 1 Inhaler 5 Unknown   clindamycin (CLEOCIN) 300 MG capsule Take 300 mg by mouth 3 (three) times daily.   05/17/2024 Evening   ipratropium (ATROVENT  HFA) 17  MCG/ACT inhaler Inhale 2 puffs into the lungs every 4 (four) hours as needed for wheezing. 1 each 12 Unknown   ipratropium (ATROVENT ) 0.03 % nasal spray Place 2 sprays into both nostrils every 12 (twelve) hours. 30 mL 12 Unknown   ipratropium-albuterol  (DUONEB) 0.5-2.5 (3) MG/3ML SOLN Take 3 mLs by nebulization every 6 (six) hours as needed. 360 mL 3 Unknown   aspirin  EC 81 MG tablet Take 81 mg by mouth every other day.  (Patient not taking: Reported on 05/18/2024)   Not Taking   azithromycin  (ZITHROMAX ) 250 MG tablet Take 250 mg by mouth as directed. (Patient not taking: Reported on 05/18/2024)   Not Taking   doxycycline (VIBRAMYCIN) 100 MG capsule Take 100 mg by mouth 2 (two) times daily. (Patient not taking: Reported on 05/18/2024)   Not Taking   ELIQUIS  5 MG TABS  tablet Take 2.5 mg by mouth 2 (two) times daily. (Patient not taking: Reported on 04/09/2024)   Not Taking   fluticasone  (FLONASE ) 50 MCG/ACT nasal spray Place 2 sprays into both nostrils daily. (Patient not taking: Reported on 04/09/2024) 16 g 2 Not Taking   ibuprofen (ADVIL) 200 MG tablet Take 200 mg by mouth every 6 (six) hours as needed for mild pain. (Patient not taking: Reported on 04/09/2024)   Not Taking   losartan -hydrochlorothiazide  (HYZAAR) 100-12.5 MG tablet Take 1 tablet by mouth daily. (Patient not taking: Reported on 04/09/2024)   Not Taking   Naphazoline-Glycerin (REDNESS RELIEF OP) Place 1 drop into both eyes daily as needed (for redness/itching). (Patient not taking: Reported on 04/09/2024)   Not Taking   predniSONE  (DELTASONE ) 20 MG tablet Take 20 mg by mouth daily. (Patient not taking: Reported on 05/18/2024)   Not Taking   PROAIR  HFA 108 (90 Base) MCG/ACT inhaler Inhale 2 puffs into the lungs every 4 (four) hours as needed for shortness of breath. (Patient not taking: Reported on 04/09/2024)   Not Taking   rosuvastatin  (CRESTOR ) 10 MG tablet Take 1 tablet by mouth once daily (Patient not taking: Reported on 04/09/2024) 90 tablet 2 Not Taking   triamcinolone  (KENALOG ) 0.1 % Apply 1 application. topically 3 (three) times daily as needed (itching). (Patient not taking: Reported on 04/09/2024)   Not Taking   Scheduled:   Chlorhexidine  Gluconate Cloth  6 each Topical Daily   enoxaparin  (LOVENOX ) injection  80 mg Subcutaneous Q12H   famotidine   20 mg Per Tube Daily   hydrocortisone  sod succinate (SOLU-CORTEF ) inj  100 mg Intravenous Q8H   insulin  aspart  0-15 Units Subcutaneous Q4H   Infusions:   azithromycin  500 mg (05/19/24 0915)   ceFEPime  (MAXIPIME ) IV 2 g (05/19/24 1044)   fentaNYL  infusion INTRAVENOUS 125 mcg/hr (05/19/24 1116)   norepinephrine  (LEVOPHED ) Adult infusion     propofol  (DIPRIVAN ) infusion 10 mcg/kg/min (05/19/24 1322)   vancomycin  1,000 mg (05/19/24 1120)     Assessment: Pt has a hx of PAF but not on AC. Per daughter, he stopped it because he didn't like the bruising. Corean R discussed with daughter and we will increase lovenox  to full dose for bridging.   Hgb 8.6, plt wnl Scr 1.34>>Crcl greater than 30 ml/min  Goal of Therapy:  Anti-Xa level 0.6-1 units/ml 4hrs after LMWH dose given Monitor platelets by anticoagulation protocol: Yes   Plan:  Increase lovenox  to 80mg  SQ BID F/u option for oral [facility]  Sergio Batch, PharmD, BCIDP, AAHIVP, CPP Infectious Disease Pharmacist 05/19/2024 1:48 PM

## 2024-05-19 NOTE — Progress Notes (Addendum)
 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  On ED arrival patient was seen in acute respiratory distress with hypoxia despite application of nonrebreather prompting emergent intubation.  Additional vital signs significant for mild hypothermia, tachypnea, and mild hypotension. Lab work significant for NA 133, K6.8, glucose 286, creatinine 1.31, GFR 54, albumin 3.3, lactic acid 1.9, WBC severely elevated at 16.3 with neutrophils of 51.6 and immature granulocyte 5 3.  PCCM consult for additional management and admission.  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 rate (!) 22, height 5' 10.98 (1.803 m), weight 77 kg, SpO2 100%. CVP:  [6 mmHg-15 mmHg] 9 mmHg  Vent Mode: PRVC FiO2 (%):  [40 %-60 %] 40 % Set Rate:  [22 bmp] 22 bmp Vt Set:   [600 mL] 600 mL PEEP:  [5 cmH20] 5 cmH20 Plateau Pressure:  [18 cmH20-23 cmH20] 21 cmH20   Intake/Output Summary (Last 24 hours) at 05/19/2024 1129 Last data filed at 05/19/2024 1100 Gross per 24 hour  Intake 1707.81 ml  Output 1055 ml  Net 652.81 ml   Filed Weights   05/18/24 0728  Weight: 77 kg   Physical Examination: General: Acute-on-chronically ill-appearing elderly man in NAD. Intermittently agitated with sedation weaning. HEENT: [place]/AT, anicteric sclera, PERRL 3mm, moist mucous membranes. Neuro: Intermittently agitated versus sedated. Responds to verbal stimuli. Not following commands. Moves all 4 extremities spontaneously. +Cough and +Gag  CV: Irregularly irregular rhythm, rate 100s, no m/g/r. PULM: Breathing even and unlabored on vent (PEEP 5, FiO2 40%). Lung fields diminished throughout. GI: Soft, nontender, nondistended. Normoactive bowel sounds. Extremities: No LE edema noted. Skin: Warm/dry, no rashes.  Resolved Problem List:   Assessment and Plan:  Acute-on-chronic hypoxic and hypercapnic respiratory failure, intermittent use of supplemental oxygen at night Moderate bilateral pleural effusions with mild diffuse interstitial edema History of multiple lung cancers, s/p SBRT History of COPD Lung CA x 3 separate occasions, treated with stereotactic radiation.  CXR on admission with observed right upper lobe lung mass correlating with known cancer but additional nodular density within the right middle lobe concerning for possible new site of disease or possible infection. - Continue full vent support (4-8cc/kg IBW) - Wean FiO2 for O2 sat > 90% - Daily WUA/SBT, extubation currently precluded by mental status - VAP bundle - Pulmonary hygiene - PAD protocol for sedation: Propofol  and Fentanyl  for goal RASS 0 to -1 - Follow CXR/ABG - F/u pleural  fluid studies  Profound leukocytosis WBC 65.3 on admission with 51.6% neutrophils and 6.50 absolute immature granulocytes.  Repeat 9/15 WBC 71.1, 77% neutrophils. Smear with polychromasia, normal Plt morphology. ESR/LDH/uric acid mildly elevated. 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 Per daughter, Nanetta, patient was not taking Eliquis  for over a year; patient self-discontinued this due to the amount of skin bruising he was getting. - Cardiac monitoring - Optimize electrolytes for K > 4, Mg > 2 - Therapeutic Lovenox  for [facility], consider heparin  gtt if need for additional procedures - F/u Echo - Trend Co-ox  Hyperglycemia - SSI - CBGs Q4H - Goal CBG 140-180  Critical care time:    The patient is critically ill with multiple organ system failure and requires high complexity decision making for assessment and support, frequent evaluation and titration of therapies, advanced monitoring, review of radiographic studies and interpretation of complex data.   Critical Care Time devoted to patient care services, exclusive of separately billable procedures, described in this note is 39 minutes.  Corean CHRISTELLA Lilienne Weins, PA-C [facility] 05/19/24 11:29 AM  Please see [email or web address] for pager details.  From 7A-7P if no response, please call [phone] After hours, please call  ELink [phone]

## 2024-05-19 NOTE — Progress Notes (Signed)
  Echocardiogram 2D Echocardiogram has been performed.  Norleen ORN [facility] 05/19/2024, 9:37 AM

## 2024-05-20 DIAGNOSIS — I4901 Ventricular fibrillation: Secondary | ICD-10-CM

## 2024-05-20 DIAGNOSIS — J9601 Acute respiratory failure with hypoxia: Secondary | ICD-10-CM | POA: Diagnosis not present

## 2024-05-20 DIAGNOSIS — D72829 Elevated white blood cell count, unspecified: Secondary | ICD-10-CM

## 2024-05-20 DIAGNOSIS — R092 Respiratory arrest: Secondary | ICD-10-CM

## 2024-05-20 DIAGNOSIS — R7881 Bacteremia: Secondary | ICD-10-CM | POA: Diagnosis not present

## 2024-05-20 DIAGNOSIS — A499 Bacterial infection, unspecified: Secondary | ICD-10-CM

## 2024-05-20 DIAGNOSIS — Z7189 Other specified counseling: Secondary | ICD-10-CM | POA: Diagnosis not present

## 2024-05-20 DIAGNOSIS — B49 Unspecified mycosis: Secondary | ICD-10-CM | POA: Insufficient documentation

## 2024-05-20 DIAGNOSIS — E43 Unspecified severe protein-calorie malnutrition: Secondary | ICD-10-CM | POA: Insufficient documentation

## 2024-05-20 LAB — BASIC METABOLIC PANEL WITH GFR
Anion gap: 13 (ref 5–15)
BUN: 42 mg/dL — ABNORMAL HIGH (ref 8–23)
CO2: 22 mmol/L (ref 22–32)
Calcium: 8.5 mg/dL — ABNORMAL LOW (ref 8.9–10.3)
Chloride: 100 mmol/L (ref 98–111)
Creatinine, Ser: 1.53 mg/dL — ABNORMAL HIGH (ref 0.61–1.24)
GFR, Estimated: 45 mL/min — ABNORMAL LOW
Glucose, Bld: 180 mg/dL — ABNORMAL HIGH (ref 70–99)
Potassium: 4 mmol/L (ref 3.5–5.1)
Sodium: 135 mmol/L (ref 135–145)

## 2024-05-20 LAB — TROPONIN I (HIGH SENSITIVITY): Troponin I (High Sensitivity): 231 ng/L (ref <18)

## 2024-05-20 LAB — CBC WITH DIFFERENTIAL/PLATELET
Abs Immature Granulocytes: 5.53 K/uL — ABNORMAL HIGH (ref 0.00–0.07)
Basophils Absolute: 0 K/uL (ref 0.0–0.1)
Basophils Relative: 0 %
Eosinophils Absolute: 0.1 K/uL (ref 0.0–0.5)
Eosinophils Relative: 0 %
HCT: 26.4 % — ABNORMAL LOW (ref 39.0–52.0)
Hemoglobin: 8.5 g/dL — ABNORMAL LOW (ref 13.0–17.0)
Immature Granulocytes: 8 %
Lymphocytes Relative: 1 %
Lymphs Abs: 0.5 K/uL — ABNORMAL LOW (ref 0.7–4.0)
MCH: 28.1 pg (ref 26.0–34.0)
MCHC: 32.2 g/dL (ref 30.0–36.0)
MCV: 87.4 fL (ref 80.0–100.0)
Monocytes Absolute: 10.3 K/uL — ABNORMAL HIGH (ref 0.1–1.0)
Monocytes Relative: 16 %
Neutro Abs: 49.6 K/uL — ABNORMAL HIGH (ref 1.7–7.7)
Neutrophils Relative %: 75 %
Platelets: 226 K/uL (ref 150–400)
RBC: 3.02 MIL/uL — ABNORMAL LOW (ref 4.22–5.81)
RDW: 15.2 % (ref 11.5–15.5)
Smear Review: NORMAL
WBC: 66.1 K/uL (ref 4.0–10.5)
nRBC: 0 % (ref 0.0–0.2)

## 2024-05-20 LAB — CULTURE, BAL-QUANTITATIVE W GRAM STAIN: Culture: 1000 — AB

## 2024-05-20 LAB — GLUCOSE, CAPILLARY
Glucose-Capillary: 193 mg/dL — ABNORMAL HIGH (ref 70–99)
Glucose-Capillary: 163 mg/dL — ABNORMAL HIGH (ref 70–99)
Glucose-Capillary: 133 mg/dL — ABNORMAL HIGH (ref 70–99)
Glucose-Capillary: 143 mg/dL — ABNORMAL HIGH (ref 70–99)
Glucose-Capillary: 136 mg/dL — ABNORMAL HIGH (ref 70–99)
Glucose-Capillary: 127 mg/dL — ABNORMAL HIGH (ref 70–99)

## 2024-05-20 LAB — URIC ACID: Uric Acid, Serum: 10 mg/dL — ABNORMAL HIGH (ref 3.7–8.6)

## 2024-05-20 LAB — CYTOLOGY - NON PAP

## 2024-05-20 LAB — PHOSPHORUS: Phosphorus: 2.3 mg/dL — ABNORMAL LOW (ref 2.5–4.6)

## 2024-05-20 LAB — ACID FAST SMEAR (AFB, MYCOBACTERIA): Acid Fast Smear: NEGATIVE

## 2024-05-20 LAB — MAGNESIUM: Magnesium: 2.8 mg/dL — ABNORMAL HIGH (ref 1.7–2.4)

## 2024-05-20 LAB — LACTIC ACID, PLASMA: Lactic Acid, Venous: 1.6 mmol/L (ref 0.5–1.9)

## 2024-05-20 LAB — PATHOLOGIST SMEAR REVIEW

## 2024-05-20 MED ORDER — PROPOFOL 1000 MG/100ML IV EMUL
0.0000 ug/kg/min | INTRAVENOUS | Status: DC
  Administered 2024-05-20: 10 ug/kg/min via INTRAVENOUS
  Administered 2024-05-20: 29.897 ug/kg/min via INTRAVENOUS
  Administered 2024-05-21: 30 ug/kg/min via INTRAVENOUS
  Administered 2024-05-21: 29.897 ug/kg/min via INTRAVENOUS
  Filled 2024-05-20 (×4): qty 100

## 2024-05-20 MED ORDER — SODIUM PHOSPHATES 45 MMOLE/15ML IV SOLN
15.0000 mmol | Freq: Once | INTRAVENOUS | Status: AC
  Administered 2024-05-20: 15 mmol via INTRAVENOUS
  Filled 2024-05-20: qty 5

## 2024-05-20 MED ORDER — PANTOPRAZOLE SODIUM 40 MG IV SOLR
40.0000 mg | INTRAVENOUS | Status: DC
  Administered 2024-05-20 – 2024-05-21 (×2): 40 mg via INTRAVENOUS
  Filled 2024-05-20 (×2): qty 10

## 2024-05-20 NOTE — Progress Notes (Signed)
 NAME:  Christopher Rocha, MRN:  [id number], DOB:  1940/11/30, LOS: 2 ADMISSION DATE:  05/18/2024, CONSULTATION DATE:  05/18/2024 REFERRING MD:  Dasie - EDP, CHIEF COMPLAINT:  Acute hypoxic respiratory failure    History of Present Illness:  83 year old man who presented to [facility] ED 9/14 from home via EMS with complaints of acute SOB. PMHx significant for HTN, HLD, CAD (s/p CABG, on ASA), Afib (prescribed Eliquis , not taking), severe AS s/p TAVR, anemia, multiple lung cancers (treated with SBRT).  On ED arrival, patient was noted to have acute respiratory distress with hypoxia despite application of NRB, prompting emergent intubation. Vitals were notable for mild hypothermia, tachypnea, and mild hypotension. Lab work significant for Na 133, K 6.8, glucose 286, creatinine 1.31 (GFR 54), albumin 3.3, LA  1.9. WBC severely elevated at 65.3 (79% neutrophils) and immature granulocyte 5 3.    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, and anemia   Significant Hospital Events: Including procedures, antibiotic start and stop dates in addition to other pertinent events   9/14 Presented for acute shortness of breath admission workup concerning for volume overload and/or pneumonia  Interim History / Subjective:  Overnight, Code Blue called Initially VF, CPR x 3 min with Epi x 1, Amio 150mg , defib x 1 with ROSC Patient again deteriorated into VF with Torsades, received CPR x 9 min with Epi x 1mg , Amio 150mg , Lido 100mg , Mg 4g IV, bicarb x1 with ROSC Family came to bedside, patient made DNR More stable this AM  Objective:   Blood pressure 104/66, pulse (!) 53, temperature 98.1 F (36.7 C), resp. rate (!) 28, height 5' 10.98 (1.803 m), weight 74.7 kg, SpO2 100%. CVP:  [8 mmHg-26 mmHg] 10 mmHg  Vent Mode: PRVC FiO2 (%):  [40 %] 40  % Set Rate:  [22 bmp-28 bmp] 28 bmp Vt Set:  [600 mL] 600 mL PEEP:  [5 cmH20] 5 cmH20 Plateau Pressure:  [21 cmH20] 21 cmH20   Intake/Output Summary (Last 24 hours) at 05/20/2024 0757 Last data filed at 05/20/2024 0500 Gross per 24 hour  Intake 997.06 ml  Output 600 ml  Net 397.06 ml   Filed Weights   05/18/24 0728 05/20/24 0240  Weight: 77 kg 74.7 kg   Physical Examination: General: Acute-on-chronically ill-appearing elderly man in NAD. HEENT: [place] /AT, anicteric sclera, PERRL 3mm, moist mucous membranes. Neuro: Sedated with intermittent brief periods of agitation, Responds to verbal stimuli. Not following commands. Moves all 4 extremities spontaneously. +Cough and +Gag  CV: Irregularly irregular rhythm, rate 60s, no m/g/r. Prior median sternotomy, well-healed. PULM: Breathing even and unlabored on vent (PEEP 5, FiO2 40%). Lung fields diminished at bilateral bases, scattered rhonchi. GI: Soft, nontender, nondistended. Normoactive bowel sounds. Extremities: No LE edema noted. Skin: Warm/dry, no rashes.  Resolved Problem List:   Assessment and Plan:  Acute-on-chronic hypoxic and hypercapnic respiratory failure, intermittent use of supplemental oxygen at night Moderate bilateral pleural effusions with mild diffuse interstitial edema History of multiple lung cancers, s/p SBRT History of COPD Lung CA x 3 separate occasions, treated with stereotactic radiation.  CXR on admission with observed right upper lobe lung mass correlating with known cancer but additional nodular density within the right middle lobe concerning for possible new site of disease or possible infection. - Continue full vent support (4-8cc/kg IBW) - Wean FiO2 for O2 sat >  90% - Daily WUA/SBT, extubation currently precluded by mental status and overnight cardiac arrest, will not attempt wean today - VAP bundle - Pulmonary hygiene - PAD protocol for sedation: Propofol  and Fentanyl  for goal RASS 0 to -1 - Follow  CXR/ABG - F/u pleural fluid studies  Candidemia Enterobacterales bacteremia Profound leukocytosis WBC 65.3 on admission with 51.6% neutrophils and 6.50 absolute immature granulocytes. Repeat 9/15 WBC 71.1, 77% neutrophils. Smear with polychromasia, normal Plt morphology. ESR/LDH/uric acid mildly elevated. - Trend WBC, fever curve - Trend uric acid - Flow cytometry pending - F/u Cx data, BCID +Enterobacterales, +candida albicans - Continue broad-spectrum antibiotics (cefepime , micafungin  added given history of TAVR)  VF/VT cardiac arrest, in-hospital - Goal MAP > 65 - Fluid resuscitation as tolerated - Levophed  titrated to goal MAP - Optimize lytes as below - Mg/Phos repleted - Low threshold for formal Cardiology consult, given complex cardiac history and now VF/VT arrest  CAD s/p CABG Valvular heart disease including severe aortic stenosis s/p TAVR Essential hypertension  Hyperlipidemia  History of atrial fibrillation anticoagulated on Eliquis  Home meds include aspirin , Eliquis , HCTZ, Crestor . Per daughter, Nanetta, patient was not taking Eliquis  for over a year; patient self-discontinued this due to the amount of skin bruising he was getting. Echo with EF 30-35%, moderately decreased LV function, normal RV function, trivial MR, s/p TAVR (Sapien bioprosthetic valve). 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 - Trend H&H - Monitor for signs of active bleeding - Transfuse for Hgb < 7.0 or hemodynamically significant bleeding  At risk acute kidney injury  Hyperkalemia - Trend BMP - Replete electrolytes as indicated - Monitor I&Os - Avoid nephrotoxic agents as able - Ensure adequate renal perfusion  Hyperglycemia - SSI - CBGs Q4H - Goal CBG 140-180  Critical care time:    The  patient is critically ill with multiple organ system failure and requires high complexity decision making for assessment and support, frequent evaluation and titration of therapies, advanced monitoring, review of radiographic studies and interpretation of complex data.   Critical Care Time devoted to patient care services, exclusive of separately billable procedures, described in this note is 37 minutes.  Corean CHRISTELLA Elisandra Deshmukh, PA-C [facility] 05/20/24 7:57 AM  Please see [email or web address] for pager details.  From 7A-7P if no response, please call [phone] After hours, please call ELink [phone]

## 2024-05-20 NOTE — Consult Note (Signed)
 Cardiology Consultation   Patient ID: Christopher Rocha MRN: [id number]; DOB: 15-Aug-1941  Admit date: 05/18/2024 Date of Consult: 05/20/2024  PCP:  Ina Marcellus RAMAN, MD   [facility] Providers Cardiologist:  Jennifer JONELLE Crape, MD      Patient Profile: Christopher Rocha is a 83 y.o. male with a hx of hypertension, hyperlipidemia, tobacco abuse, COPD, diastolic dysfunction, atrial fibrillation, coronary artery disease s/p CABG, aortic stenosis s/p TAVR, and multiple lung cancers s/p radiation who is being seen 05/20/2024 for the evaluation of cardiac arrest at the request of Geronimo Amel MD.  History of Present Illness: Mr. Christopher Rocha is an 83 year old male with prior cardiac history listed below.  Had CABG x 2 performed in 2014 at [facility]  Echocardiogram showed severe aortic stenosis. On 06/2018 the patient had a TAVR procedure performed by Dr. Wonda  Patient previously declined to take anticoagulation for his atrial fibrillation.   Patient was last seen by Dr. Crape on 12/2021 for preoperative risk evaluation and a nuclear stress test with lexiscan  was ordered.  The stress test showed a low normal LVEF of 53%, but found no ischemia and no prior myocardial infarction.  Patient presented to the emergency department on 05/18/2024 for worsening shortness of breath.  Patient had leukocytosis and culture showed the patient had candidemia and Enterobacterales bacteremia.  Because of this the patient was started on broad-spectrum antibiotics and antifungals.   Last night the patient went into V-fib arrest.  The first time the patient went into V-fib about 3 minutes before ROSC patient received chest compressions, epi, amiodarone  and was defibrillated.  The second time the patient went into V-fib and torsades for 9 minutes before ROSC and received chest compressions, epi, amiodarone , defib, lidocaine , magnesium , and bicarb.  Following these 2 episodes of cardiac arrest the family  wanted to make the patient DNR.  The patient was placed on low-dose IV Levophed  drip and IV amiodarone .  Patient's daughter was present during interview.  She reported that her father has been sedated in this hospitalization and gets agitated whenever they try to remove the sedation.  Discussed how the patient's lung cancer means he has a poor long-term prognosis.  Labs showed potassium of 4.0, magnesium  of 2.8, AKI with creatinine of 1.53, anemia with a hemoglobin of 8.5, leukocytosis with a WBC count of 66.1.  EKG showed normal sinus rhythm with first-degree AV block, and left bundle branch block that was present on prior EKGs.  Echocardiogram showed a reduced LVEF of 30 to 35%, motion abnormalities were unable to be evaluated, elevated left atrial pressure, moderate MAC, trivial MR, aortic valve replacement in position, and IVC with less than 50% respiratory variability indicating elevated right atrial pressures.   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    Pneumonia    2023- last time   Pulmonary nodule    S/P CABG (coronary artery bypass graft)    S/P TAVR (transcatheter aortic valve replacement)    Edwards Sapien XT THV (size 29 mm, model # 9300TFX, serial # [id number])   Severe aortic stenosis    Unspecified atrial fibrillation (HCC) 08/19/2020    Past Surgical History:  Procedure Laterality Date   BONE MARROW ASPIRATION     BRONCHIAL BIOPSY  02/13/2022   Procedure: BRONCHIAL BIOPSIES;  Surgeon: Shelah Lamar RAMAN, MD;  Location: [facility] ENDOSCOPY;  Service: Pulmonary;;   BRONCHIAL BIOPSY  11/27/2022   Procedure: BRONCHIAL BIOPSIES;  Surgeon: Shelah Lamar RAMAN, MD;  Location: [facility] ENDOSCOPY;  Service: Pulmonary;;   BRONCHIAL BRUSHINGS  02/13/2022   Procedure: BRONCHIAL BRUSHINGS;  Surgeon: Shelah Lamar RAMAN, MD;  Location: [facility] ENDOSCOPY;  Service: Pulmonary;;   BRONCHIAL BRUSHINGS  11/27/2022   Procedure: BRONCHIAL BRUSHINGS;  Surgeon: Shelah Lamar RAMAN, MD;  Location: [facility] ENDOSCOPY;  Service: Pulmonary;;   BRONCHIAL NEEDLE ASPIRATION BIOPSY  02/13/2022   Procedure: BRONCHIAL NEEDLE ASPIRATION BIOPSIES;  Surgeon: Shelah Lamar RAMAN, MD;  Location: [facility] ENDOSCOPY;  Service: Pulmonary;;   BRONCHIAL NEEDLE ASPIRATION BIOPSY  11/27/2022   Procedure: BRONCHIAL NEEDLE ASPIRATION BIOPSIES;  Surgeon: Shelah Lamar RAMAN, MD;  Location: [facility] ENDOSCOPY;  Service: Pulmonary;;   BRONCHIAL WASHINGS  02/13/2022   Procedure: BRONCHIAL WASHINGS;  Surgeon: Shelah Lamar RAMAN, MD;  Location: [facility] ENDOSCOPY;  Service: Pulmonary;;   BRONCHIAL WASHINGS  11/27/2022   Procedure: BRONCHIAL WASHINGS;  Surgeon: Shelah Lamar RAMAN, MD;  Location: [facility] ENDOSCOPY;  Service: Pulmonary;;   CARDIAC CATHETERIZATION     CARDIAC SURGERY     COLONOSCOPY     CORONARY ARTERY BYPASS GRAFT  2011   EYE SURGERY     bilateral cataracts   FIBEROPTIC BRONCHOSCOPY  04/08/2019       FIDUCIAL MARKER PLACEMENT  02/13/2022   Procedure: FIDUCIAL MARKER PLACEMENT;  Surgeon: Shelah Lamar RAMAN, MD;  Location: [facility] ENDOSCOPY;   Service: Pulmonary;;   FIDUCIAL MARKER PLACEMENT  11/27/2022   Procedure: FIDUCIAL MARKER PLACEMENT;  Surgeon: Shelah Lamar RAMAN, MD;  Location: [facility] ENDOSCOPY;  Service: Pulmonary;;   RIGHT/LEFT HEART CATH AND CORONARY/GRAFT ANGIOGRAPHY N/A 05/08/2018   Procedure: RIGHT/LEFT HEART CATH AND CORONARY/GRAFT ANGIOGRAPHY;  Surgeon: Darron Deatrice LABOR, MD;  Location: [facility] INVASIVE CV LAB;  Service: Cardiovascular;  Laterality: N/A;   TEE WITHOUT CARDIOVERSION  07/02/2018   Procedure: TRANSESOPHAGEAL ECHOCARDIOGRAM (TEE);  Surgeon: Wonda Sharper, MD;  Location: [facility] OR;  Service: Open Heart Surgery;;   TRANSCATHETER AORTIC VALVE REPLACEMENT, TRANSAPICAL N/A 07/02/2018   Procedure: TRANSCATHETER AORTIC VALVE REPLACEMENT, TRANSAPICAL;  Surgeon: Wonda Sharper, MD;  Location: [facility] OR;  Service: Open Heart Surgery;  Laterality: N/A;   VIDEO BRONCHOSCOPY Bilateral 05/31/2018   Procedure: VIDEO BRONCHOSCOPY WITH FLUORO;  Surgeon: Jude Harden GAILS, MD;  Location: [facility] ENDOSCOPY;  Service: Cardiopulmonary;  Laterality: Bilateral;   VIDEO BRONCHOSCOPY WITH ENDOBRONCHIAL NAVIGATION N/A 04/08/2019   Procedure: VIDEO BRONCHOSCOPY WITH ENDOBRONCHIAL NAVIGATION;  Surgeon: Jude Harden GAILS, MD;  Location: [facility] OR;  Service: Thoracic;  Laterality: N/A;   VIDEO BRONCHOSCOPY WITH ENDOBRONCHIAL ULTRASOUND N/A 04/08/2019   Procedure: Video Bronchoscopy With Endobronchial Ultrasound;  Surgeon: Jude Harden GAILS,  MD;  Location: [facility] OR;  Service: Thoracic;  Laterality: N/A;   WISDOM TOOTH EXTRACTION       Home Medications:  Prior to Admission medications   Medication Sig Start Date End Date Taking? Authorizing Provider  acetaminophen  (TYLENOL ) 325 MG tablet Take 650 mg by mouth every 6 (six) hours as needed for mild pain.   Yes [provider]  budesonide -formoterol  (SYMBICORT ) 160-4.5 MCG/ACT inhaler Inhale 2 puffs into the lungs 2 (two) times daily. 10/27/19  Yes Jude Harden GAILS, MD  clindamycin (CLEOCIN) 300 MG capsule Take 300 mg  by mouth 3 (three) times daily. 05/15/24  Yes [provider]  ipratropium (ATROVENT  HFA) 17 MCG/ACT inhaler Inhale 2 puffs into the lungs every 4 (four) hours as needed for wheezing. 12/27/22  Yes Byrum, Robert S, MD  ipratropium (ATROVENT ) 0.03 % nasal spray Place 2 sprays into both nostrils every 12 (twelve) hours. 01/12/23  Yes Shelah Lamar RAMAN, MD  ipratropium-albuterol  (DUONEB) 0.5-2.5 (3) MG/3ML SOLN Take 3 mLs by nebulization every 6 (six) hours as needed. 04/09/24  Yes Hope Almarie ORN, NP  aspirin  EC 81 MG tablet Take 81 mg by mouth every other day.  Patient not taking: Reported on 05/18/2024    [provider]  azithromycin  (ZITHROMAX ) 250 MG tablet Take 250 mg by mouth as directed. Patient not taking: Reported on 05/18/2024 05/06/24   [provider]  doxycycline (VIBRAMYCIN) 100 MG capsule Take 100 mg by mouth 2 (two) times daily. Patient not taking: Reported on 05/18/2024 05/06/24   [provider]  ELIQUIS  5 MG TABS tablet Take 2.5 mg by mouth 2 (two) times daily. Patient not taking: Reported on 04/09/2024 01/26/24   [provider]  fluticasone  (FLONASE ) 50 MCG/ACT nasal spray Place 2 sprays into both nostrils daily. Patient not taking: Reported on 04/09/2024 11/24/22   Byrum, Robert S, MD  ibuprofen (ADVIL) 200 MG tablet Take 200 mg by mouth every 6 (six) hours as needed for mild pain. Patient not taking: Reported on 04/09/2024    [provider]  losartan -hydrochlorothiazide  (HYZAAR) 100-12.5 MG tablet Take 1 tablet by mouth daily. Patient not taking: Reported on 04/09/2024    [provider]  Naphazoline-Glycerin (REDNESS RELIEF OP) Place 1 drop into both eyes daily as needed (for redness/itching). Patient not taking: Reported on 04/09/2024    [provider]  predniSONE  (DELTASONE ) 20 MG tablet Take 20 mg by mouth daily. Patient not taking: Reported on 05/18/2024 05/06/24   [provider]  PROAIR  HFA 108 (90 Base)  MCG/ACT inhaler Inhale 2 puffs into the lungs every 4 (four) hours as needed for shortness of breath. Patient not taking: Reported on 04/09/2024 01/23/19   [provider]  rosuvastatin  (CRESTOR ) 10 MG tablet Take 1 tablet by mouth once daily Patient not taking: Reported on 04/09/2024 11/19/20   Revankar, Rajan R, MD  triamcinolone  (KENALOG ) 0.1 % Apply 1 application. topically 3 (three) times daily as needed (itching). Patient not taking: Reported on 04/09/2024 08/04/20   [provider]    Scheduled Meds:  Chlorhexidine  Gluconate Cloth  6 each Topical Daily   enoxaparin  (LOVENOX ) injection  80 mg Subcutaneous Q12H   hydrocortisone  sod succinate (SOLU-CORTEF ) inj  100 mg Intravenous Q8H   insulin  aspart  0-15 Units Subcutaneous Q4H   pantoprazole  (PROTONIX ) IV  40 mg Intravenous Q24H   Continuous Infusions:  ceFEPime  (MAXIPIME ) IV 2 g (05/20/24 0951)   fentaNYL  infusion INTRAVENOUS 75 mcg/hr (05/20/24 1044)   micafungin  (MYCAMINE )  100 mg in sodium chloride  0.9 % 100 mL IVPB 100 mg (05/19/24 1625)   norepinephrine  (LEVOPHED ) Adult infusion 3 mcg/min (05/20/24 1044)   propofol  (DIPRIVAN ) infusion 30 mcg/kg/min (05/20/24 1306)   sodium phosphate  15 mmol in sodium chloride  0.9 % 250 mL infusion 15 mmol (05/20/24 1308)   PRN Meds: [START ON 2024/06/06] acetaminophen  **OR** [START ON 06-06-2024] acetaminophen  (TYLENOL ) oral liquid 160 mg/5 mL **OR** [START ON 2024/06/06] acetaminophen , docusate, polyethylene glycol  Allergies:    Allergies  Allergen Reactions   Penicillins Hives and Rash    Did it involve swelling of the face/tongue/throat, SOB, or low BP? No Did it involve sudden or severe rash/hives, skin peeling, or any reaction on the inside of your mouth or nose? No Did you need to seek medical attention at a hospital or doctor's office? No When did it last happen?      10-15 years If all above answers are NO, may proceed with cephalosporin use.     Breztri  Aerosphere  [Budeson-Glycopyrrol-Formoterol ] Swelling    Social History:   Social History   Socioeconomic History   Marital status: Widowed    Spouse name: Not on file   Number of children: Not on file   Years of education: Not on file   Highest education level: Not on file  Occupational History   Not on file  Tobacco Use   Smoking status: Former    Current packs/day: 0.00    Types: Cigarettes    Quit date: 02/16/1988    Years since quitting: 36.2   Smokeless tobacco: Never  Vaping Use   Vaping status: Never Used  Substance and Sexual Activity   Alcohol  use: Not Currently   Drug use: Never   Sexual activity: Not on file  Other Topics Concern   Not on file  Social History Narrative   Not on file   Social Drivers of Health   Financial Resource Strain: Not on file  Food Insecurity: Low Risk  (01/21/2024)   Received from [facility]   Hunger Vital Sign    Within the past 12 months, you worried that your food would run out before you got money to buy more: Never true    Within the past 12 months, the food you bought just didn't last and you didn't have money to get more. : Never true  Transportation Needs: No Transportation Needs (01/21/2024)   Received from [facility]    In the past 12 months, has lack of reliable transportation kept you from medical appointments, meetings, work or from getting things needed for daily living? : No  Physical Activity: Not on file  Stress: Not on file  Social Connections: Not on file  Intimate Partner Violence: Not on file    Family History:    Family History  Problem Relation Age of Onset   Diabetes Mother    Clotting disorder Father      ROS:  Please see the history of present illness.   All other ROS reviewed and negative.     Physical Exam/Data: Vitals:   05/20/24 0600 05/20/24 0700 05/20/24 0805 05/20/24 1135  BP:      Pulse: (!) 53 (!) 58 (!) 58   Resp: (!) 28 (!) 28 (!) 28   Temp: 98.1 F (36.7 C)      TempSrc:      SpO2: 100% 100% 100% 100%  Weight:      Height:        Intake/Output  Summary (Last 24 hours) at 05/20/2024 1423 Last data filed at 05/20/2024 0500 Gross per 24 hour  Intake 174.46 ml  Output 600 ml  Net -425.54 ml      05/20/2024    2:40 AM 05/18/2024    7:28 AM 04/09/2024    2:57 PM  Last 3 Weights  Weight (lbs) 164 lb 10.9 oz 169 lb 12.1 oz 170 lb 3.2 oz  Weight (kg) 74.7 kg 77 kg 77.202 kg     Body mass index is 22.98 kg/m.  General:  Well nourished, well developed, sedated and on a ventilator. HEENT: normal Neck: no JVD Vascular: No carotid bruits; Distal pulses 1+ bilaterally Cardiac:  normal S1, S2; RRR; no murmur  Lungs: Wheezing heard throughout the lungs Abd: soft, nontender, no hepatomegaly  Ext: no edema Musculoskeletal:  No deformities Skin: warm and dry  Neuro:   no focal abnormalities noted Psych:  Normal affect   EKG:  The EKG was personally reviewed and demonstrates:  normal sinus rhythm with first-degree AV block, and left bundle branch block Telemetry:  Telemetry was personally reviewed and demonstrates: Sinus arrhythmia with heart rates in the 60s to 80s  Relevant CV Studies:  TTE IMPRESSIONS     1. Technically difficult study with poor visualization of cardiac  structures.   2. Left ventricular ejection fraction, by estimation, is 30 to 35%. The  left ventricle has moderately decreased function. Left ventricular  endocardial border not optimally defined to evaluate regional wall motion.  Left ventricular diastolic function  could not be evaluated. Elevated left atrial pressure.   3. Right ventricular systolic function was not well visualized, however,  appears normal on limited views. The right ventricular size is normal.   4. The mitral valve is grossly normal. Trivial mitral valve  regurgitation. Moderate mitral annular calcification.   5. The aortic valve has been repaired/replaced. Aortic valve  regurgitation is not  visualized. There is a 29 mm Sapian Sapien prosthetic  (TAVR) valve present in the aortic position.   6. The inferior vena cava is dilated in size with <50% respiratory  variability, suggesting right atrial pressure of 15 mmHg.   Laboratory Data: High Sensitivity Troponin:   Recent Labs  Lab 05/18/24 0743 05/18/24 1348 05/18/24 1619 05/19/24 2217 05/19/24 2349  TROPONINIHS 93* 166* 175* 195* 231*     Chemistry Recent Labs  Lab 05/19/24 0919 05/19/24 2206 05/19/24 2217 05/20/24 0413  NA 136 134* 133* 135  K 4.7 3.7 3.9 4.0  CL 98  --  96* 100  CO2 25  --  23 22  GLUCOSE 143*  --  238* 180*  BUN 34*  --  39* 42*  CREATININE 1.34*  --  1.49* 1.53*  CALCIUM  9.6  --  9.1 8.5*  MG 1.8  --  4.7* 2.8*  GFRNONAA 53*  --  46* 45*  ANIONGAP 13  --  14 13    Recent Labs  Lab 05/18/24 0722  PROT 6.1*  ALBUMIN 3.3*  AST 32  ALT 23  ALKPHOS 73  BILITOT 1.2   Lipids  Recent Labs  Lab 05/19/24 0428  TRIG 73    Hematology Recent Labs  Lab 05/19/24 0919 05/19/24 2206 05/19/24 2217 05/20/24 0413  WBC 71.1*  --  78.8* 66.1*  RBC 3.02*  --  3.31* 3.02*  HGB 8.6* 8.8* 9.4* 8.5*  HCT 26.8* 26.0* 30.1* 26.4*  MCV 88.7  --  90.9 87.4  MCH 28.5  --  28.4 28.1  MCHC 32.1  --  31.2 32.2  RDW 15.1  --  15.2 15.2  PLT 215  --  243 226   Thyroid  No results for input(s): TSH, FREET4 in the last 168 hours.  BNP Recent Labs  Lab 05/18/24 0720  BNP 1,025.2*    DDimer No results for input(s): DDIMER in the last 168 hours.  Radiology/Studies:  DG Chest Port 1 View Result Date: 05/19/2024 CLINICAL DATA:  5112 Cardiac arrest (HCC) 5112 EXAM: PORTABLE CHEST 1 VIEW COMPARISON:  Chest x-ray 05/19/2024 4:48 a.m. FINDINGS: Patient is rotated. Left internal jugular central venous catheter with tip overlying expected region of the distal superior vena cava. Endotracheal tube approximately 4 cm above the carina. Enteric tube coursing below the diaphragm with tip and side port  collimated off view. The heart and mediastinal contours are unchanged. Aortic valve replacement. Atherosclerotic plaque. Interval worsening of right lower lobe patchy airspace opacities. No pulmonary edema. Bilateral trace pleural effusion. No pneumothorax. No acute osseous abnormality. IMPRESSION: 1. Interval worsening of right lower lobe patchy airspace opacities. Bilateral trace pleural effusions. 2. Lines and tubes as above with limited evaluation due to patient rotation. 3.  Aortic Atherosclerosis (ICD10-I70.0). Electronically Signed   By: Morgane  Naveau M.D.   On: 05/19/2024 23:10   ECHOCARDIOGRAM COMPLETE Result Date: 05/19/2024    ECHOCARDIOGRAM REPORT   Patient Name:   Christopher Rocha Date of Exam: 05/19/2024 Medical Rec #:  [id number]            Height:       71.0 in Accession #:    [id number]           Weight:       169.8 lb Date of Birth:  1940-12-03            BSA:          1.967 m Patient Age:    83 years             BP:           129/64 mmHg Patient Gender: M                    HR:           64 bpm. Exam Location:  Inpatient Procedure: 2D Echo and Intracardiac Opacification Agent (Both Spectral and Color            Flow Doppler were utilized during procedure). Indications:    chf  History:        Patient has prior history of Echocardiogram examinations. CHF,                 CAD, Aortic Valve Disease; Arrythmias:Atrial Fibrillation.                 Aortic Valve: 29 mm Sapian Sapien prosthetic, stented (TAVR)                 valve is present in the aortic position.  Sonographer:    Norleen Amour Referring Phys: [phone] [name] D HARRIS IMPRESSIONS  1. Technically difficult study with poor visualization of cardiac structures.  2. Left ventricular ejection fraction, by estimation, is 30 to 35%. The left ventricle has moderately decreased function. Left ventricular endocardial border not optimally defined to evaluate regional wall motion. Left ventricular diastolic function could not be evaluated.  Elevated left atrial pressure.  3. Right ventricular systolic function was not well visualized, however, appears normal on limited views. The right ventricular  size is normal.  4. The mitral valve is grossly normal. Trivial mitral valve regurgitation. Moderate mitral annular calcification.  5. The aortic valve has been repaired/replaced. Aortic valve regurgitation is not visualized. There is a 29 mm Sapian Sapien prosthetic (TAVR) valve present in the aortic position.  6. The inferior vena cava is dilated in size with <50% respiratory variability, suggesting right atrial pressure of 15 mmHg. FINDINGS  Left Ventricle: Left ventricular ejection fraction, by estimation, is 30 to 35%. The left ventricle has moderately decreased function. Left ventricular endocardial border not optimally defined to evaluate regional wall motion. Definity  contrast agent was given IV to delineate the left ventricular endocardial borders. The left ventricular internal cavity size was normal in size. There is no left ventricular hypertrophy. Left ventricular diastolic function could not be evaluated due to nondiagnostic images. Left ventricular diastolic function could not be evaluated. Elevated left atrial pressure. Right Ventricle: The right ventricular size is normal. Right vetricular wall thickness was not well visualized. Right ventricular systolic function was not well visualized. Left [facility]: Left atrial size was normal in size. Right [facility]: Right atrial size was normal in size. Pericardium: There is no evidence of pericardial effusion. Mitral Valve: The mitral valve is grossly normal. Moderate mitral annular calcification. Trivial mitral valve regurgitation. MV peak gradient, 6.8 mmHg. The mean mitral valve gradient is 2.0 mmHg. Tricuspid Valve: The tricuspid valve is grossly normal. Tricuspid valve regurgitation is trivial. Aortic Valve: The aortic valve has been repaired/replaced. Aortic valve regurgitation is not visualized.  Aortic valve mean gradient measures 4.2 mmHg. Aortic valve peak gradient measures 8.9 mmHg. Aortic valve area, by VTI measures 1.63 cm. There is a 29 mm Sapian Sapien prosthetic, stented (TAVR) valve present in the aortic position. Pulmonic Valve: The pulmonic valve was not well visualized. Pulmonic valve regurgitation is not visualized. Aorta: The aortic root is normal in size and structure. Venous: The inferior vena cava is dilated in size with less than 50% respiratory variability, suggesting right atrial pressure of 15 mmHg. IAS/Shunts: No atrial level shunt detected by color flow Doppler.  LEFT VENTRICLE PLAX 2D LVIDd:         5.50 cm      Diastology LVIDs:         4.30 cm      LV e' medial:    4.28 cm/s LV PW:         0.80 cm      LV E/e' medial:  32.0 LV IVS:        1.20 cm      LV e' lateral:   8.33 cm/s LVOT diam:     2.10 cm      LV E/e' lateral: 16.4 LV SV:         46 LV SV Index:   24 LVOT Area:     3.46 cm  LV Volumes (MOD) LV vol d, MOD A2C: 150.0 ml LV vol d, MOD A4C: 147.0 ml LV vol s, MOD A2C: 64.2 ml LV vol s, MOD A4C: 97.5 ml LV SV MOD A2C:     85.8 ml LV SV MOD A4C:     147.0 ml LV SV MOD BP:      68.3 ml RIGHT VENTRICLE            IVC RV Basal diam:  3.10 cm    IVC diam: 2.20 cm RV S prime:     4.08 cm/s TAPSE (M-mode): 1.8 cm LEFT ATRIUM  Index        RIGHT ATRIUM           Index LA diam:        4.10 cm 2.08 cm/m   RA Area:     10.70 cm LA Vol (A2C):   69.9 ml 35.54 ml/m  RA Volume:   22.50 ml  11.44 ml/m LA Vol (A4C):   88.3 ml 44.90 ml/m LA Biplane Vol: 83.1 ml 42.26 ml/m  AORTIC VALVE                    PULMONIC VALVE AV Area (Vmax):    1.53 cm     PV Vmax:       0.90 m/s AV Area (Vmean):   1.35 cm     PV Peak grad:  3.2 mmHg AV Area (VTI):     1.63 cm AV Vmax:           148.75 cm/s AV Vmean:          93.625 cm/s AV VTI:            0.284 m AV Peak Grad:      8.9 mmHg AV Mean Grad:      4.2 mmHg LVOT Vmax:         65.50 cm/s LVOT Vmean:        36.500 cm/s LVOT VTI:           0.134 m LVOT/AV VTI ratio: 0.47  AORTA Ao Root diam: 2.40 cm Ao Asc diam:  3.30 cm MITRAL VALVE MV Area (PHT): 2.73 cm     SHUNTS MV Area VTI:   1.52 cm     Systemic VTI:  0.13 m MV Peak grad:  6.8 mmHg     Systemic Diam: 2.10 cm MV Mean grad:  2.0 mmHg MV Vmax:       1.30 m/s MV Vmean:      68.5 cm/s MV Decel Time: 278 msec MV E velocity: 137.00 cm/s MV A velocity: 35.00 cm/s MV E/A ratio:  3.91 Aditya Sabharwal Electronically signed by Ria Commander Signature Date/Time: 05/19/2024/4:12:02 PM    Final    DG Chest Port 1 View Result Date: 05/19/2024 CLINICAL DATA:  Pleural effusions fluid EXAM: PORTABLE CHEST 1 VIEW COMPARISON:  05/18/2024 FINDINGS: Lungs are hyperexpanded.r endotracheal tube tip is approximately 3.3 cm above the base of the carina. The NG tube passes into the stomach although the distal tip position is not included on the film. Left IJ central line tip overlies the mid SVC level. Stable right pleuroparenchymal opacity. Right infrahilar nodularity evident. Bibasilar atelectasis or infiltrate similar to prior. No substantial pleural effusion. Telemetry leads overlie the chest. IMPRESSION: 1. No substantial interval change. 2. Stable right pleuroparenchymal opacity with right infrahilar nodularity. Pulmonary nodularity better characterized on CT imaging yesterday. 3. Bibasilar atelectasis or infiltrate. Electronically Signed   By: Camellia Candle M.D.   On: 05/19/2024 06:53   DG CHEST PORT 1 VIEW Result Date: 05/18/2024 EXAM: 1 VIEW XRAY OF THE CHEST 05/18/2024 01:40:00 PM COMPARISON: 05/18/2024 CLINICAL HISTORY: Encounter for central line placement. FINDINGS: LINES, TUBES AND DEVICES: Left IJ CVC in place with tip at superior cavoatrial junction. No pneumothorax identified. ETT in place with tip 3.5 cm above carina. Enteric tube in place with tip and side port in stomach. LUNGS AND PLEURA: Small bilateral pleural effusions with bibasilar opacities, likely atelectasis. Improved in the  interval. Mild diffuse interstitial opacities. Persistent right upper lobe mass. HEART  AND MEDIASTINUM: Status post median sternotomy, aortic valve replacement, and TAVR. BONES AND SOFT TISSUES: No acute osseous abnormality. IMPRESSION: 1. Interval placement of left IJ catheter with tip at the superior cavoatrial junction. No pneumothorax identified. 2. Small bilateral pleural effusions with bibasilar opacities, likely atelectasis. Improved from previous exam 3. Mild diffuse interstitial edema. 4. Persistent right upper lobe mass. This corresponds to known treated tumor. Electronically signed by: Waddell Calk MD 05/18/2024 02:17 PM EDT RP Workstation: [id number]   CT CHEST WO CONTRAST Result Date: 05/18/2024 EXAM: CT CHEST WITHOUT CONTRAST 05/18/2024 11:07:23 AM TECHNIQUE: CT of the chest was performed without the administration of intravenous contrast. Multiplanar reformatted images are provided for review. Automated exposure control, iterative reconstruction, and/or weight based adjustment of the mA/kV was utilized to reduce the radiation dose to as low as reasonably achievable. COMPARISON: 03/18/2024 CLINICAL HISTORY: Abnormal xray - lung nodule, >= 1 cm. FINDINGS: MEDIASTINUM: Status post TAVR, CABG, and median sternotomy. Mitral valve calcification and aortic atherosclerotic calcification. No pericardial effusion. ET tube is in place with tip above the carina. Enteric tube is also in place with tip in the distal stomach. LYMPH NODES: Right paratracheal lymph node measures 1.5 cm short axis, image 68/4, previously 1.1 cm. Subcarinal node measures 1.6 cm, image 78/4, previously 1.0 cm. The hilar lymph nodes are suboptimally evaluated without IV contrast. LUNGS AND PLEURA: New moderate volume right pleural effusion. Increased volume of left pleural effusion, now moderate. Compressive type atelectasis and consolidation is identified within both lower lobes overlying the pleural effusions. Mild interlobular  septal thickening with ground glass attenuation noted bilaterally favoring interstitial edema. Within the posterior right upper lobe there is an area of mass-like architectural distortion which extends into the right apex and appears unchanged from previous exam measuring 5.7 x 4.9 cm, image 39/40. There is increased fluid along the posterior aspect of the major fissure, image 51/8. Unchanged appearance of part-solid nodule within the right middle lobe measuring 9 mm, image 115/4. Subpleural nodule within the lingula is also unchanged measuring 8 mm, image 120/4. SOFT TISSUES/BONES: Unchanged chronic fracture involving the posterior aspect of the right ninth rib, axial image 116/4. 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 No acute or suspicious osseous abnormalities noted at this time. UPPER ABDOMEN: No acute abnormality within the imaged portions of the upper abdomen. Extensive atherosclerotic calcifications. Bilateral simple-appearing kidney cysts. No follow up imaging recommended. IMPRESSION: 1. Unchanged mass-like architectural distortion in the posterior right upper lobe extending into the right apex, measuring 5.7 x 4.9 cm. Findings are presumed to represent posttreatment changes for known non-small cell lung cancer. 2. Unchanged part-solid nodule in the right middle lobe measuring 9 mm. 3. Unchanged subpleural nodule in the lingula measuring 8 mm. 4. New moderate right pleural effusion and increased moderate left pleural effusion. 5. Compressive atelectasis and consolidation in both lower lobes overlying the pleural effusions. 6. Mild interlobular septal thickening with ground glass attenuation bilaterally, favoring interstitial edema. 7. Enlarged right paratracheal (1.5 cm) and subcarinal (1.6 cm) lymph nodes. Nonspecific in the setting of chf. Electronically signed by: Waddell Calk MD 05/18/2024  11:38 AM EDT RP Workstation: [id number]   DG Chest Portable 1 View Result Date: 05/18/2024 EXAM: 1 VIEW XRAY OF THE CHEST 05/18/2024 07:44:00 AM COMPARISON: 01/22/2024 CLINICAL HISTORY: Post intubation. Reason for exam: intubation, OGT placement. FINDINGS: LINES, TUBES AND DEVICES: ET tube with tip 4.5 cm above the carina.  Enteric tube is identified with tip in the left upper quadrant of the abdomen. LUNGS AND PLEURA: Moderate bilateral pleural effusions with vale like opacification over both lungs. Mild diffuse interstitial edema. Right upper lobe lung mass is again identified corresponding to known non-small cell lung cancer. Bilateral lower lung zone opacification may reflect atelectasis or airspace disease. New nodular density within the right mid lung is of uncertain clinical significance measuring 2.1 cm. This could reflect a new site of disease or focal area of infection. HEART AND MEDIASTINUM: No acute abnormality of the cardiac and mediastinal silhouettes. BONES AND SOFT TISSUES: No acute osseous abnormality. IMPRESSION: 1. Interval placement of ET tube with tip 4.5 cm above the carina and enteric tube with tip in the left upper quadrant of the abdomen. 2. Moderate bilateral pleural effusions with veil like opacification over both lungs. 3. Mild diffuse interstitial edema. 4. Right upper lobe lung mass corresponding to known non-small cell lung cancer. 5. Nodular density within the right mid lung of uncertain clinical significance, measuring 2.1 cm, possibly representing a new site of disease or focal area of infection. Electronically signed by: Waddell Calk MD 05/18/2024 07:55 AM EDT RP Workstation: [id number]   DG Abd Portable 1 View Result Date: 05/18/2024 EXAM: 1 VIEW XRAY OF THE ABDOMEN 05/18/2024 07:44:00 AM COMPARISON: PET/CT from 01/09/2023. CLINICAL HISTORY: Post OGT placement. Reason for exam: intubation, OGT placement. FINDINGS: BOWEL: Nonobstructive bowel gas pattern. SOFT TISSUES: Enteric  tube is identified with tip in side port in the expected location of the proximal stomach. Vascular calcifications identified. BONES: No acute osseous abnormality. IMPRESSION: 1. No bowel obstruction. 2. Enteric tube tip in the expected location of the proximal stomach. Electronically signed by: Waddell Calk MD 05/18/2024 07:52 AM EDT RP Workstation: [id number]     Assessment and Plan: Christopher Rocha is a 83 y.o. male with a hx of hypertension, hyperlipidemia, tobacco abuse, COPD, diastolic dysfunction, atrial fibrillation, coronary artery disease s/p CABG, aortic stenosis s/p TAVR, and non-small cell lung cancer s/p radiation who is being seen 05/20/2024 for the evaluation of cardiac arrest at the request of Geronimo Amel MD.  Respiratory hypoxia Candidemia Enterobacterales bacteremia Was hospitalized for worsening shortness of breath and was required to be put on a ventilator.  Had leukocytosis and culture showed candidemia and bacteremia.  Was started on IV antibiotics and antifungals.  Management per primary   COPD Multiple lung cancers s/p radiation Has at least 3 different lung cancer lesions noted.  Most recently received stereotactic radiation on 01/2023.  Suspect the patient was likely has a poor long-term prognosis.  Management per primary.   Ventricular fibrillation cardiac arrest HFrEF Last night between 9 and 10 PM the patient had 2 episodes of cardiac arrest. The first time the patient went into V-fib about 3 minutes before ROSC patient received chest compressions, epi, amiodarone  and was defibrillated.  The second time the patient went into V-fib and torsades for 9 minutes before ROSC and received chest compressions, epi, amiodarone , defib, lidocaine , magnesium , and bicarb.  Following these 2 episodes of cardiac arrest the family wanted to make the patient DNR.  The patient was placed on low-dose IV Levophed  drip and IV amiodarone .   Echocardiogram showed a reduced LVEF of  30 to 35%, motion abnormalities were unable to be evaluated, elevated left atrial pressure, moderate MAC, trivial MR, aortic valve replacement in position, and IVC with less than 50% respiratory variability indicating elevated right atrial pressures. Sensitivity troponins were elevated at 93> 166 >  175 > 195 > 231. Chest x-ray showed trace bilateral pleural effusions Suspect that cardiomyopathy and elevated troponins are secondary to demand ischemia from respiratory hypoxia and sepsis Continue IV amiodarone  May consider 20 mg IV Lasix  daily.   Paroxysmal atrial fibrillation Previously declined to take anticoagulants for atrial fibrillation Continue subcu Lovenox    Aortic stenosis status post TAVR  hyperlipidemia CAD status post CABG Patient was reportedly not taking aspirin  at home and is not documented to be taking any statin.  Has had noncompliance in the past.  Will avoid starting new medications given ongoing concerns.     Risk Assessment/Risk Scores:     [place]  Heart Association (NYHA) Functional Class NYHA Class IV  CHA2DS2-VASc Score = 5   This indicates a 7.2% annual risk of stroke. The patient's score is based upon: CHF History: 1 HTN History: 1 Diabetes History: 0 Stroke History: 0 Vascular Disease History: 1 Age Score: 2 Gender Score: 0        For questions or updates, please contact [facility] Please consult [email or web address] for contact info under    Signed, Morse Clause, PA-C  05/20/2024 2:23 PM

## 2024-05-20 NOTE — Progress Notes (Signed)
 Initial Nutrition Assessment  DOCUMENTATION CODES:   Severe malnutrition in context of chronic illness  INTERVENTION:  -NPO  -Once appropriate, recommend initiating Vital 1.2 continuous @ 15 ml/hr to be advanced as tolerated 10 ml/hr q-8 to a goal rate of 55 ml/hr.  At goal this provides 1584 kcal, 99 g protein, 1071 ml fluid  -May need to hold feeds at trickle rate for hemodynamic instability  -Very high risk of refeeding, monitor phos, mg, potassium daily for at least 3 days after initiating feeds, MD to replete as needed.   -Add Thiamine 100 mg daily   -Monitor bowels and need for modification of bowel regimen    NUTRITION DIAGNOSIS:   Severe Malnutrition related to decreased appetite, chronic illness as evidenced by percent weight loss, moderate fat depletion, moderate muscle depletion.  GOAL:   Patient will meet greater than or equal to 90% of their needs   MONITOR:   Diet advancement, Vent status, Labs, Weight trends, I & O's, Skin  REASON FOR ASSESSMENT:   Ventilator, NPO/Clear Liquid Diet    ASSESSMENT:   Hx multiple lung cancer treated with radiation, HTN, HLD, CAD s/p CABG, valvular heart disease including severe aortic stenosis s/p TAVR who, and anemia who presented to the ED via EMS from home with complaints of acute shortness of breath.  On ED arrival patient was seen in acute respiratory distress with hypoxia despite application of nonrebreather prompting emergent intubation.  Spoke to nursing, no acute events today. Last night between 9 and 10 PM the patient had 2 episodes of cardiac arrest. The first time the patient went into V-fib about 3 minutes before ROSC patient received chest compressions, epi, amiodarone  and was defibrillated. The second time the patient went into V-fib and torsades for 9 minutes before ROSC and received chest compressions, epi, amiodarone , defib, lidocaine , magnesium , and bicarb. Following these events family opted to change pt's  code status to DNR.  Pt continues on Propofol , current rate (13.45 ml/hr) providing 355 kcal/day. Levophed  requirements are increasing over course of day. Most recent MAP improved to 68.   Once appropriate, initiate Vital 1.2 @ 15 ml/hr, may need to hold at trickle feeds depending on hemodynamic stability. Increase as tolerated 10 ml/hr q-8 to a goal rate of 55 ml/hr, provides 1584 kcal, 99 g protein, 1071 ml fluid. NFPE completed (see below). Weight loss of 12.5 kg (14.3%) x 6 months. Meets criteria for protein calorie malnutrition. Recommend initiation of EN and titration to goal as soon as appropriate. Pt is high risk of refeeding-phos, mg already below goal range and needing repletion; monitor phos, mg, potassium closely. 100 mg Thiamine daily per tube.   Spoke to pt's family in room. Pt with long hx of decreased PO intake, states he is a grazer. Pt does enjoy sweets. Family encourages PO intake, provides ONS for pt.   Labs BG 133-193 BUN 42 Cr 1.53 Ca 8.5 Phos 2.3 Mg 2.8 Albumin 3.3 GFR 45 H/H 8.5/26.4 A1c 6.3  Medications Scheduled Meds:  Chlorhexidine  Gluconate Cloth  6 each Topical Daily   enoxaparin  (LOVENOX ) injection  80 mg Subcutaneous Q12H   hydrocortisone  sod succinate (SOLU-CORTEF ) inj  100 mg Intravenous Q8H   insulin  aspart  0-15 Units Subcutaneous Q4H   pantoprazole  (PROTONIX ) IV  40 mg Intravenous Q24H   Continuous Infusions:  ceFEPime  (MAXIPIME ) IV 2 g (05/20/24 0951)   fentaNYL  infusion INTRAVENOUS 75 mcg/hr (05/20/24 1044)   micafungin  (MYCAMINE ) 100 mg in sodium chloride  0.9 % 100 mL  IVPB 100 mg (05/19/24 1625)   norepinephrine  (LEVOPHED ) Adult infusion 3 mcg/min (05/20/24 1044)   propofol  (DIPRIVAN ) infusion 30 mcg/kg/min (05/20/24 1306)   sodium phosphate  15 mmol in sodium chloride  0.9 % 250 mL infusion 15 mmol (05/20/24 1308)   PRN Meds:.[START ON 2024-06-06] acetaminophen  **OR** [START ON June 06, 2024] acetaminophen  (TYLENOL ) oral liquid 160 mg/5 mL **OR**  [START ON Jun 06, 2024] acetaminophen , docusate, polyethylene glycol    NUTRITION - FOCUSED PHYSICAL EXAM:  Flowsheet Row Most Recent Value  Orbital Region Moderate depletion  Upper Arm Region Moderate depletion  Thoracic and Lumbar Region Moderate depletion  Buccal Region Mild depletion  [Difficult to assess r/t vent]  Temple Region Moderate depletion  Clavicle Bone Region Moderate depletion  Clavicle and Acromion Bone Region Mild depletion  Scapular Bone Region Unable to assess  Dorsal Hand Mild depletion  Patellar Region Moderate depletion  Anterior Thigh Region Moderate depletion  Posterior Calf Region Moderate depletion  Edema (RD Assessment) None  Hair Reviewed  Eyes Unable to assess  Mouth Unable to assess  Skin Reviewed  Nails Reviewed    Diet Order:   Diet Order             Diet NPO time specified  Diet effective now                   EDUCATION NEEDS:   Not appropriate for education at this time  Skin:  Skin Assessment: Skin Integrity Issues: Skin Integrity Issues:: Stage II Stage II: R and L buttocks  Last BM:  PTA  Height:   Ht Readings from Last 1 Encounters:  05/18/24 5' 10.98 (1.803 m)    Weight:   Wt Readings from Last 1 Encounters:  05/20/24 74.7 kg    BMI:  Body mass index is 22.98 kg/m.  Estimated Nutritional Needs:   Kcal:  1500-1850 kcal  Protein:  90-120 g  Fluid:  1500-1850 ml  Dora Simeone Daml-Budig, RDN, LDN Registered Dietitian Nutritionist RD Inpatient Contact Info in [place]

## 2024-05-20 NOTE — Progress Notes (Signed)
 Pharmacist Heart Failure Core Measure Documentation  Assessment: Christopher Rocha has an EF documented as 25-30% on 05/19/24 by ECHO.  Rationale: Heart failure patients with left ventricular systolic dysfunction (LVSD) and an EF < 40% should be prescribed an angiotensin converting enzyme inhibitor (ACEI) or angiotensin receptor blocker (ARB) at discharge unless a contraindication is documented in the medical record.  This patient is not currently on an ACEI or ARB for HF.  This note is being placed in the record in order to provide documentation that a contraindication to the use of these agents is present for this encounter.  ACE Inhibitor or Angiotensin Receptor Blocker is contraindicated (specify all that apply)  []   ACEI allergy AND ARB allergy []   Angioedema []   Moderate or severe aortic stenosis []   Hyperkalemia [x]   Hypotension []   Renal artery stenosis [x]   Worsening renal function, preexisting renal disease or dysfunction

## 2024-05-20 NOTE — Progress Notes (Signed)
 eLink Physician-Brief Progress Note Patient Name: Christopher Rocha DOB: 09/10/1940 MRN: [id number]   Date of Service  05/20/2024  HPI/Events of Note  cardiac arrest w/ ROSC about 10 minutes  Vented  eICU Interventions  Restraints for patient safety     Intervention Category Minor Interventions: Routine modifications to care plan (e.g. PRN medications for pain, fever)  Jlee Harkless 05/20/2024, 3:57 AM

## 2024-05-20 NOTE — Plan of Care (Signed)
   Problem: Coping: Goal: Ability to adjust to condition or change in health will improve Outcome: Progressing   Problem: Fluid Volume: Goal: Ability to maintain a balanced intake and output will improve Outcome: Progressing

## 2024-05-20 NOTE — Consult Note (Signed)
 [facility]    Date of Admission:  05/18/2024     Total days of antibiotics 3               Reason for Consult: Bacteremia and Fungemia   Referring Provider: [employer] Primary Care Provider: Ina Marcellus RAMAN, MD   ASSESSMENT:  Christopher Rocha is an 83 y/o caucasian male with non-small cell lung cancer and COPD presenting to the ED with worsening shortness of breath and found to have acute hypoxemic respiratory failure requiring intubation, leukocytosis, and Candida albicans fungemia and Enterobacterales species bacteremia. Course complicated by cardiac arrest x 2 with ROSC.   Christopher Rocha's blood culture from 05/20/24 are pending. Respiratory culture with candida albicans. Enterobacterales species identification pending. TTE with no evidence of endocarditis although cardiac structures poorly visualized. Discussed plan of care with daughter who is power of attorney to continue with current dose of Cefepime  and Micafungin . Check blood cultures for clearance of bacteremia. May need TEE to check for endocarditis in the setting of TAVR and ophthalmology evaluation for endophthalmitis. Monitor cultures for clearance of bacteremia/fungemia and organisms. Remains on vasopressors and will need central line removed when feasible in the setting of fungemia. Remaining medical and supportive care per PCCM.   PLAN:  Continue current dose of cefepime  and micafungin .  Monitor cultures for clearance of bacteremia/fungemia and for Enterobacterales organism.  May need TEE to check for endocarditis pending goals of care Consider ophthalmology exam for endophthalmitis pending goals of care. Standard / universal precautions.  Remaining medical and supportive care per PCCM.    Principal Problem:   Acute hypoxic respiratory failure (HCC) Active Problems:   Goals of care, counseling/discussion   Fungemia   Enterobacterales species bacteremia    Chlorhexidine  Gluconate  Cloth  6 each Topical Daily   enoxaparin  (LOVENOX ) injection  80 mg Subcutaneous Q12H   hydrocortisone  sod succinate (SOLU-CORTEF ) inj  100 mg Intravenous Q8H   insulin  aspart  0-15 Units Subcutaneous Q4H   pantoprazole  (PROTONIX ) IV  40 mg Intravenous Q24H     HPI: Christopher Rocha is a 83 y.o. male with previous medical history of CAD, COPD, hypertension, non-small cell lung cancer s/p radiation treatment presenting from home via EMS with shortness of breath.   Christopher Rocha began having increased shortness of breath about 2 weeks prior to presentation and was seen in Internal Medicine with chest x-ray showing a spot and was treated with azithromycin  and doxycycline for pneumonia. On follow up there was a spot still noted and was changed to clindamycin. Breathing worsened and brought to the ED on 05/18/24.   Christopher Rocha was afebrile initially and with leukocytosis of 65,300 with acute respiratory distress with hypoxia. Intubated for airway protection. CT chest with unchanged mass in setting of known non-small cell lung cancer; new moderate right pleural effusion and increased left pleural effusion; and compressive atelectasis and consolidation in both lower lobes overlying pleural effusions. Started on broad spectrum vancomycin , cefepime , and azithromycin . Blood cultures positive for Candida albicans and Enterobacterales species. Micafungin  added for fungemia. Course complicated by cardiac arrest x 10 minutes last night.   Currently afebrile with leukocytosis with WBC count 66,100. Daughter at bedside provides the history as he remains intubated and sedated. Currently with a left internal jugular central line and has history of TAVR.  TTE completed with poor visualization of cardiac structure and no evidence of endocarditis.     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   Pneumonia    2023- last time   Pulmonary nodule    S/P CABG (coronary artery bypass graft)    S/P TAVR (transcatheter aortic valve replacement)    Edwards Sapien XT THV (size 29 mm, model # 9300TFX, serial # [id number])   Severe aortic stenosis    Unspecified atrial fibrillation (HCC) 08/19/2020    Social History   Tobacco Use   Smoking status: Former    Current packs/day: 0.00    Types: Cigarettes    Quit date: 02/16/1988    Years since quitting: 36.2   Smokeless tobacco: Never  Vaping Use   Vaping status: Never Used  Substance Use Topics   Alcohol  use: Not Currently   Drug use: Never    Family History  Problem Relation Age of Onset   Diabetes Mother    Clotting disorder Father     Allergies  Allergen Reactions   Penicillins Hives and Rash    Did it involve swelling of the face/tongue/throat, SOB, or low BP? No Did it involve sudden or severe rash/hives, skin peeling, or any reaction on the inside of your mouth or nose? No Did you need to seek medical attention at a hospital or  doctor's office? No When did it last happen?      10-15 years If all above answers are NO, may proceed with cephalosporin use.     Breztri  Aerosphere [Budeson-Glycopyrrol-Formoterol ] Swelling    OBJECTIVE: Blood pressure 104/66, pulse 61, temperature 98.1 F (36.7 C), resp. rate (!) 8, height 5' 10.98 (1.803 m), weight 74.7 kg, SpO2 100%.  Physical Exam Constitutional:      General: He is not in acute distress.    Appearance: He is well-developed.     Interventions: He is sedated, intubated and restrained.  Cardiovascular:     Rate and Rhythm: Normal rate and regular rhythm.     Heart sounds: Normal heart sounds.  Pulmonary:     Effort: Pulmonary effort is normal. He is intubated.     Breath sounds: Normal breath sounds.  Skin:    General: Skin is warm and dry.  Neurological:     Mental Status: He is oriented to person, place, and time.  Psychiatric:        Behavior: Behavior normal.        Thought Content: Thought content normal.        Judgment: Judgment normal.  Lab Results Lab Results  Component Value Date   WBC 66.1 (HH) 05/20/2024   HGB 8.5 (L) 05/20/2024   HCT 26.4 (L) 05/20/2024   MCV 87.4 05/20/2024   PLT 226 05/20/2024    Lab Results  Component Value Date   CREATININE 1.53 (H) 05/20/2024   BUN 42 (H) 05/20/2024   NA 135 05/20/2024   K 4.0 05/20/2024   CL 100 05/20/2024   CO2 22 05/20/2024    Lab Results  Component Value Date   ALT 23 05/18/2024   AST 32 05/18/2024   ALKPHOS 73 05/18/2024   BILITOT 1.2 05/18/2024     Microbiology: Recent Results (from the past 240 hours)  Resp panel by RT-PCR (RSV, Flu A&B, Covid) Anterior Nasal Swab     Status: None   Collection Time: 05/18/24  7:22 AM   Specimen: Anterior Nasal Swab  Result Value Ref Range Status   SARS Coronavirus 2 by RT PCR NEGATIVE NEGATIVE Final   Influenza A by PCR NEGATIVE NEGATIVE Final   Influenza B by PCR NEGATIVE NEGATIVE Final    Comment: (NOTE) The Xpert Xpress  SARS-CoV-2/FLU/RSV plus assay is intended as an aid in the diagnosis of influenza from Nasopharyngeal swab specimens and should not be used as a sole basis for treatment. Nasal washings and aspirates are unacceptable for Xpert Xpress SARS-CoV-2/FLU/RSV testing.  Fact Sheet for Patients: BloggerCourse.com  Fact Sheet for Healthcare Providers: SeriousBroker.it  This test is not yet approved or cleared by the [place]  FDA and has been authorized for detection and/or diagnosis of SARS-CoV-2 by FDA under an Emergency Use Authorization (EUA). This EUA will remain in effect (meaning this test can be used) for the duration of the COVID-19 declaration under Section 564(b)(1) of the Act, 21 U.S.C. section 360bbb-3(b)(1), unless the authorization is terminated or revoked.     Resp Syncytial Virus by PCR NEGATIVE NEGATIVE Final    Comment: (NOTE) Fact Sheet for Patients: BloggerCourse.com  Fact Sheet for Healthcare Providers: SeriousBroker.it  This test is not yet approved or cleared by the [place]  FDA and has been authorized for detection and/or diagnosis of SARS-CoV-2 by FDA under an Emergency Use Authorization (EUA). This EUA will remain in effect (meaning this test can be used) for the duration of the COVID-19 declaration under Section 564(b)(1) of the Act, 21 U.S.C. section 360bbb-3(b)(1), unless the authorization is terminated or revoked.  Performed at [facility], [address], [place], [place] [address]   Blood culture (routine x 2)     Status: Abnormal (Preliminary result)   Collection Time: 05/18/24  7:22 AM   Specimen: BLOOD  Result Value Ref Range Status   Specimen Description BLOOD RIGHT ANTECUBITAL  Final   Special Requests   Final    BOTTLES DRAWN AEROBIC AND ANAEROBIC Blood Culture adequate volume   Culture  Setup Time   Final    GRAM NEGATIVE  RODS ANAEROBIC BOTTLE ONLY CRITICAL RESULT CALLED TO, READ BACK BY AND VERIFIED WITH: PHARMD JEREMY FRENS ON 05/19/24 @ 1503 BY DRT    Culture (A)  Final    ENTEROBACTER SPECIES IDENTIFICATION AND SUSCEPTIBILITIES TO FOLLOW Performed at [facility], [address], [place], [place] [address]    Report Status PENDING  Incomplete  Blood Culture ID Panel (Reflexed)     Status: Abnormal   Collection Time: 05/18/24  7:22 AM  Result Value Ref Range Status   Enterococcus faecalis NOT DETECTED NOT DETECTED Final   Enterococcus  Faecium NOT DETECTED NOT DETECTED Final   Listeria monocytogenes NOT DETECTED NOT DETECTED Final   Staphylococcus species NOT DETECTED NOT DETECTED Final   Staphylococcus aureus (BCID) NOT DETECTED NOT DETECTED Final   Staphylococcus epidermidis NOT DETECTED NOT DETECTED Final   Staphylococcus lugdunensis NOT DETECTED NOT DETECTED Final   Streptococcus species NOT DETECTED NOT DETECTED Final   Streptococcus agalactiae NOT DETECTED NOT DETECTED Final   Streptococcus pneumoniae NOT DETECTED NOT DETECTED Final   Streptococcus pyogenes NOT DETECTED NOT DETECTED Final   A.calcoaceticus-baumannii NOT DETECTED NOT DETECTED Final   Bacteroides fragilis NOT DETECTED NOT DETECTED Final   Enterobacterales DETECTED (A) NOT DETECTED Final    Comment: Enterobacterales represent a large order of gram negative bacteria, not a single organism. Refer to culture for further identification. CRITICAL RESULT CALLED TO, READ BACK BY AND VERIFIED WITH: PHARMD JEREMY FRENS ON 05/19/24 @ 1503 BY DRT    Enterobacter cloacae complex NOT DETECTED NOT DETECTED Final   Escherichia coli NOT DETECTED NOT DETECTED Final   Klebsiella aerogenes NOT DETECTED NOT DETECTED Final   Klebsiella oxytoca NOT DETECTED NOT DETECTED Final   Klebsiella pneumoniae NOT DETECTED NOT DETECTED Final   Proteus species NOT DETECTED NOT DETECTED Final   Salmonella species NOT DETECTED NOT DETECTED Final   Serratia  marcescens NOT DETECTED NOT DETECTED Final   Haemophilus influenzae NOT DETECTED NOT DETECTED Final   Neisseria meningitidis NOT DETECTED NOT DETECTED Final   Pseudomonas aeruginosa NOT DETECTED NOT DETECTED Final   Stenotrophomonas maltophilia NOT DETECTED NOT DETECTED Final   Candida albicans DETECTED (A) NOT DETECTED Final    Comment: CRITICAL RESULT CALLED TO, READ BACK BY AND VERIFIED WITH: PHARMD JEREMY FRENS ON 05/19/24 @ 1503 BY DRT    Candida auris NOT DETECTED NOT DETECTED Final   Candida glabrata NOT DETECTED NOT DETECTED Final   Candida krusei NOT DETECTED NOT DETECTED Final   Candida parapsilosis NOT DETECTED NOT DETECTED Final   Candida tropicalis NOT DETECTED NOT DETECTED Final   Cryptococcus neoformans/gattii NOT DETECTED NOT DETECTED Final   CTX-M ESBL NOT DETECTED NOT DETECTED Final   Carbapenem resistance IMP NOT DETECTED NOT DETECTED Final   Carbapenem resistance KPC NOT DETECTED NOT DETECTED Final   Carbapenem resistance NDM NOT DETECTED NOT DETECTED Final   Carbapenem resist OXA 48 LIKE NOT DETECTED NOT DETECTED Final   Carbapenem resistance VIM NOT DETECTED NOT DETECTED Final    Comment: Performed at [facility], [address], [place], [place] [address]  Blood culture (routine x 2)     Status: None (Preliminary result)   Collection Time: 05/18/24  7:27 AM   Specimen: BLOOD LEFT ARM  Result Value Ref Range Status   Specimen Description BLOOD LEFT ARM  Final   Special Requests   Final    BOTTLES DRAWN AEROBIC AND ANAEROBIC Blood Culture adequate volume   Culture   Final    NO GROWTH 2 DAYS Performed at [facility], [address] [place], [place] [address]    Report Status PENDING  Incomplete  MRSA Next Gen by PCR, Nasal     Status: None   Collection Time: 05/18/24  8:42 AM   Specimen: Nasal Mucosa; Nasal Swab  Result Value Ref Range Status   MRSA by PCR Next Gen NOT DETECTED NOT DETECTED Final    Comment: (NOTE) The GeneXpert MRSA Assay  (FDA approved for NASAL specimens only), is one component of a comprehensive MRSA colonization surveillance program. It  is not intended to diagnose MRSA infection nor to guide or monitor treatment for MRSA infections. Test performance is not FDA approved in patients less than 50 years old. Performed at [facility], [address] [place], [place] [address]   Body fluid culture w Gram Stain     Status: None (Preliminary result)   Collection Time: 05/18/24 11:26 AM   Specimen: Pleural Fluid  Result Value Ref Range Status   Specimen Description PLEURAL  Final   Special Requests NONE  Final   Gram Stain   Final    RARE WBC PRESENT, PREDOMINANTLY PMN NO ORGANISMS SEEN    Culture   Final    NO GROWTH 2 DAYS Performed at [facility], [address] [place], [place] [address]    Report Status PENDING  Incomplete  Body fluid culture w Gram Stain     Status: None (Preliminary result)   Collection Time: 05/18/24 11:29 AM   Specimen: Pleural Fluid  Result Value Ref Range Status   Specimen Description PLEURAL  Final   Special Requests NONE  Final   Gram Stain NO WBC SEEN NO ORGANISMS SEEN   Final   Culture   Final    NO GROWTH 2 DAYS Performed at [facility], [address], [place], [place] [address]    Report Status PENDING  Incomplete  Culture, BAL-quantitative w Gram Stain     Status: Abnormal (Preliminary result)   Collection Time: 05/18/24  1:36 PM   Specimen: Bronchoalveolar Lavage; Respiratory  Result Value Ref Range Status   Specimen Description BRONCHIAL ALVEOLAR LAVAGE  Final   Special Requests RT LOWER  Final   Gram Stain   Final    RARE WBC PRESENT, PREDOMINANTLY PMN NO ORGANISMS SEEN Performed at [facility], [address] [place], [place] [address]    Culture 20,000 COLONIES/mL CANDIDA ALBICANS (A)  Final   Report Status PENDING  Incomplete  Culture, BAL-quantitative w Gram Stain     Status: Abnormal   Collection Time: 05/18/24   1:37 PM   Specimen: Bronchoalveolar Lavage; Respiratory  Result Value Ref Range Status   Specimen Description BRONCHIAL ALVEOLAR LAVAGE  Final   Special Requests RT UPPER  Final   Gram Stain   Final    RARE WBC PRESENT, PREDOMINANTLY PMN NO SQUAMOUS EPITHELIAL CELLS SEEN NO ORGANISMS SEEN Performed at [facility], [address], [place], [place] [address]    Culture 1,000 COLONIES/mL CANDIDA ALBICANS (A)  Final   Report Status 05/20/2024 FINAL  Final    I have personally spent 32 minutes involved in face-to-face and non-face-to-face activities for this patient on the day of the visit. Professional time spent includes the following activities: preparing to see the patient (review of tests), obtaining and reviewing separately obtained history (admission/discharge record), performing a medically appropriate examination, ordering medications, communicating with other health care professionals, documenting clinical information in the EMR, communicating results and counseling patient and family regarding medication and plan of care, and care coordination.   Greg Gertie Broerman, NP [facility]  05/20/2024  3:10 PM

## 2024-05-21 LAB — CBC WITH DIFFERENTIAL/PLATELET
Abs Immature Granulocytes: 7.12 K/uL — ABNORMAL HIGH (ref 0.00–0.07)
Basophils Absolute: 0 K/uL (ref 0.0–0.1)
Basophils Relative: 0 %
Eosinophils Absolute: 0 K/uL (ref 0.0–0.5)
Eosinophils Relative: 0 %
HCT: 27.4 % — ABNORMAL LOW (ref 39.0–52.0)
Hemoglobin: 8.8 g/dL — ABNORMAL LOW (ref 13.0–17.0)
Immature Granulocytes: 13 %
Lymphocytes Relative: 1 %
Lymphs Abs: 0.5 K/uL — ABNORMAL LOW (ref 0.7–4.0)
MCH: 28.3 pg (ref 26.0–34.0)
MCHC: 32.1 g/dL (ref 30.0–36.0)
MCV: 88.1 fL (ref 80.0–100.0)
Monocytes Absolute: 7.7 K/uL — ABNORMAL HIGH (ref 0.1–1.0)
Monocytes Relative: 14 %
Neutro Abs: 40.2 K/uL — ABNORMAL HIGH (ref 1.7–7.7)
Neutrophils Relative %: 72 %
Platelets: 237 K/uL (ref 150–400)
RBC: 3.11 MIL/uL — ABNORMAL LOW (ref 4.22–5.81)
RDW: 15.3 % (ref 11.5–15.5)
Smear Review: NORMAL
WBC: 55.6 K/uL (ref 4.0–10.5)
nRBC: 0 % (ref 0.0–0.2)

## 2024-05-21 LAB — BODY FLUID CULTURE W GRAM STAIN
Culture: NO GROWTH
Culture: NO GROWTH
Gram Stain: NONE SEEN

## 2024-05-21 LAB — CULTURE, BAL-QUANTITATIVE W GRAM STAIN: Culture: 20000 — AB

## 2024-05-21 LAB — BASIC METABOLIC PANEL WITH GFR
Anion gap: 16 — ABNORMAL HIGH (ref 5–15)
BUN: 47 mg/dL — ABNORMAL HIGH (ref 8–23)
CO2: 23 mmol/L (ref 22–32)
Calcium: 8.6 mg/dL — ABNORMAL LOW (ref 8.9–10.3)
Chloride: 99 mmol/L (ref 98–111)
Creatinine, Ser: 1.68 mg/dL — ABNORMAL HIGH (ref 0.61–1.24)
GFR, Estimated: 40 mL/min — ABNORMAL LOW (ref >60)
Glucose, Bld: 134 mg/dL — ABNORMAL HIGH (ref 70–99)
Potassium: 3.9 mmol/L (ref 3.5–5.1)
Sodium: 138 mmol/L (ref 135–145)

## 2024-05-21 LAB — GLUCOSE, CAPILLARY
Glucose-Capillary: 126 mg/dL — ABNORMAL HIGH (ref 70–99)
Glucose-Capillary: 153 mg/dL — ABNORMAL HIGH (ref 70–99)
Glucose-Capillary: 145 mg/dL — ABNORMAL HIGH (ref 70–99)

## 2024-05-21 LAB — SURGICAL PATHOLOGY

## 2024-05-21 LAB — MAGNESIUM: Magnesium: 2.3 mg/dL (ref 1.7–2.4)

## 2024-05-21 LAB — PHOSPHORUS: Phosphorus: 3.3 mg/dL (ref 2.5–4.6)

## 2024-05-21 MED ORDER — ACETAMINOPHEN 650 MG RE SUPP: 650.0000 mg | Freq: Four times a day (QID) | RECTAL | Status: DC | PRN

## 2024-05-21 MED ORDER — MIDAZOLAM HCL 2 MG/2ML IJ SOLN: 1.0000 mg | INTRAMUSCULAR | Status: DC | PRN

## 2024-05-21 MED ORDER — MIDAZOLAM-SODIUM CHLORIDE 100-0.9 MG/100ML-% IV SOLN
0.0000 mg/h | INTRAVENOUS | Status: DC
  Administered 2024-05-21: 2 mg/h via INTRAVENOUS
  Filled 2024-05-21: qty 100

## 2024-05-21 MED ORDER — HALOPERIDOL LACTATE 5 MG/ML IJ SOLN: 2.5000 mg | INTRAMUSCULAR | Status: DC | PRN

## 2024-05-21 MED ORDER — GLYCOPYRROLATE 1 MG PO TABS: 1.0000 mg | ORAL_TABLET | ORAL | Status: DC | PRN

## 2024-05-21 MED ORDER — ENOXAPARIN SODIUM 40 MG/0.4ML IJ SOSY
40.0000 mg | PREFILLED_SYRINGE | INTRAMUSCULAR | Status: DC
Start: 2024-05-22 — End: 2024-05-21

## 2024-05-21 MED ORDER — POLYVINYL ALCOHOL 1.4 % OP SOLN: 1.0000 [drp] | Freq: Four times a day (QID) | OPHTHALMIC | Status: DC | PRN

## 2024-05-21 MED ORDER — MIDAZOLAM BOLUS VIA INFUSION (WITHDRAWAL LIFE SUSTAINING TX)
2.0000 mg | INTRAVENOUS | Status: DC | PRN
  Administered 2024-05-21: 2 mg via INTRAVENOUS

## 2024-05-21 MED ORDER — GLYCOPYRROLATE 0.2 MG/ML IJ SOLN: 0.2000 mg | INTRAMUSCULAR | Status: DC | PRN

## 2024-05-21 MED ORDER — ACETAMINOPHEN 325 MG PO TABS: 650.0000 mg | ORAL_TABLET | Freq: Four times a day (QID) | ORAL | Status: DC | PRN

## 2024-05-21 MED ORDER — SODIUM CHLORIDE 0.9 % IV SOLN: INTRAVENOUS | Status: DC

## 2024-05-21 MED ORDER — DIPHENHYDRAMINE HCL 50 MG/ML IJ SOLN: 25.0000 mg | INTRAMUSCULAR | Status: DC | PRN

## 2024-05-23 LAB — CULTURE, BLOOD (ROUTINE X 2)
Culture: NO GROWTH
Special Requests: ADEQUATE

## 2024-05-25 LAB — CULTURE, BLOOD (ROUTINE X 2)
Culture: NO GROWTH
Special Requests: ADEQUATE

## 2024-05-27 ENCOUNTER — Ambulatory Visit: Payer: Self-pay | Admitting: Internal Medicine

## 2024-05-27 LAB — FLOW CYTOMETRY

## 2024-05-29 LAB — CULTURE, BLOOD (ROUTINE X 2)
Culture: NO GROWTH
Special Requests: ADEQUATE

## 2024-06-01 LAB — CULTURE, BLOOD (ROUTINE X 2): Special Requests: ADEQUATE

## 2024-06-04 NOTE — Plan of Care (Signed)
   Problem: Coping: Goal: Ability to adjust to condition or change in health will improve Outcome: Progressing   Problem: Fluid Volume: Goal: Ability to maintain a balanced intake and output will improve Outcome: Progressing

## 2024-06-04 NOTE — Procedures (Signed)
 Extubation Procedure Note  Patient Details:   Name: Christopher Rocha DOB: [birth year] MRN: [id number]   Airway Documentation:    Vent end date: 05-22-2024 Vent end time: 1358   Evaluation  O2 sats: currently acceptable Complications: No apparent complications Patient did tolerate procedure well. Bilateral Breath Sounds: Clear   No  One way extubation to comfort care  Breda Bond 05-22-2024, 1:59 PM

## 2024-06-04 NOTE — Progress Notes (Signed)
 NAME:  Christopher Rocha, MRN:  [id number], DOB:  March 10, 1941, LOS: 3 ADMISSION DATE:  05/18/2024, CONSULTATION DATE:  05/18/2024 REFERRING MD:  Dasie - EDP, CHIEF COMPLAINT:  Acute hypoxic respiratory failure    History of Present Illness:  83 year old man who presented to [facility] ED 9/14 from home via EMS with complaints of acute SOB. PMHx significant for HTN, HLD, CAD (s/p CABG, on ASA), Afib (prescribed Eliquis , not taking), severe AS s/p TAVR, anemia, multiple lung cancers (treated with SBRT).  On ED arrival, patient was noted to have acute respiratory distress with hypoxia despite application of NRB, prompting emergent intubation. Vitals were notable for mild hypothermia, tachypnea, and mild hypotension. Lab work significant for Na 133, K 6.8, glucose 286, creatinine 1.31 (GFR 54), albumin 3.3, LA  1.9. WBC severely elevated at 65.3 (79% neutrophils) and immature granulocyte 5 3.    PCCM consulted for ICU admission and management.  Pertinent Medical History:  Multiple lung cancer treated with radiation, HTN, HLD, CAD s/p CABG, valvular heart disease including severe aortic stenosis s/p TAVR, and anemia   Significant Hospital Events: Including procedures, antibiotic start and stop dates in addition to other pertinent events   9/14 Presented for acute shortness of breath admission workup concerning for volume overload and/or pneumonia  Interim History / Subjective:  No significant events overnight Less ectopy on tele today per RN Seen by Cardiology, unfortunately high risk for further decompensation, feel patient would not benefit from additional interventions and that arrest was likely demand-driven 2/2 sepsis Seen by ID, recs pending GOC Family coming to visit today, transitioning to comfort care  Objective:   Blood pressure 104/66, pulse (!) 56, temperature 98.8 F (37.1 C), resp. rate (!) 28, height 5' 10.98 (1.803 m), weight 74.7 kg, SpO2 100%. CVP:  [6 mmHg-14 mmHg] 6 mmHg   Vent Mode: PRVC FiO2 (%):  [40 %] 40 % Set Rate:  [28 bmp] 28 bmp Vt Set:  [600 mL] 600 mL PEEP:  [5 cmH20] 5 cmH20 Plateau Pressure:  [21 cmH20-23 cmH20] 21 cmH20   Intake/Output Summary (Last 24 hours) at 06/11/24 1057 Last data filed at 06-11-24 0600 Gross per 24 hour  Intake 538.58 ml  Output 550 ml  Net -11.42 ml   Filed Weights   05/18/24 0728 05/20/24 0240 2024-06-11 0425  Weight: 77 kg 74.7 kg 74.7 kg   Physical Examination: General: Acute-on-chronically ill-appearing elderly man in NAD. Appears comfortable. HEENT: [place]/AT, anicteric sclera, PERRL 3mm, moist mucous membranes. Neuro: Intubated, sedated. Does not respond to verbal, tactile or noxious stimuli. Not following commands. +Corneal, +Cough, and +Gag  CV: Irregularly irregular rhythm, rate 50s-70s, no m/g/r. PULM: Breathing even and unlabored on vent (PEEP 5, FiO2 40%). Lung fields coarse, diminished at bases. GI: Soft, nontender, nondistended. Normoactive bowel sounds. Extremities: No LE edema noted. Skin: Warm/dry, no rashes.  Resolved Problem List:   Assessment and Plan:  Acute-on-chronic hypoxic and hypercapnic respiratory failure, intermittent use of supplemental oxygen at night Moderate bilateral pleural effusions with mild diffuse interstitial edema History of multiple lung cancers, s/p SBRT History of COPD Candidemia Enterobacterales bacteremia Profound leukocytosis VF/VT cardiac arrest, in-hospital CAD s/p CABG Valvular heart disease including severe aortic stenosis s/p TAVR Essential hypertension  Hyperlipidemia  History of atrial fibrillation anticoagulated on Eliquis  Anemia, chronic related to iron deficiency At risk acute kidney injury  Hyperkalemia Hyperglycemia  - Compassionate extubation this afternoon, per family - Supplemental O2 for comfort, if needed - Robinul  for secretions, if needed - PAD  protocol for sedation: Fentanyl  and Versed  per Comfort Care protocol, Haldol  PRN for  agitation - Discontinue antibiotics - Appreciate Cardiology input, unable to offer additional interventions due to degree of sepsis/comorbid conditions, agree with recommendation for comfort care - Transition to full comfort care this afternoon; orders placed, chaplain to meet with family prior to extubation - DNR-Comfort Care code status, anticipate in-hospital death  Critical care time:    The patient is critically ill with multiple organ system failure and requires high complexity decision making for assessment and support, frequent evaluation and titration of therapies, advanced monitoring, review of radiographic studies and interpretation of complex data.   Critical Care Time devoted to patient care services, exclusive of separately billable procedures, described in this note is 32 minutes.  Christopher CHRISTELLA Christopher Asbridge, PA-C [facility] 06-15-2024 10:57 AM  Please see [email or web address] for pager details.  From 7A-7P if no response, please call [phone] After hours, please call ELink [phone]

## 2024-06-04 NOTE — Discharge Summary (Signed)
 DISCHARGE SUMMARY    Date of admit: 05/18/2024  7:20 AM Date of discharge: 16-Jun-2024  5:19 PM Length of Stay: 3 days  PCP is Ina Marcellus RAMAN, MD  CAUSE(S) OF DEATH  Septic Shock - due to severe sepsis due to Enterobacter Bacteremia/Septicemia   PROBLEM LIST Principal Problem:   Acute hypoxic respiratory failure (HCC) Active Problems:   Goals of care, counseling/discussion   Fungemia   Enterobacterales species bacteremia   Ventricular fibrillation (HCC)   Respiratory arrest (HCC)   Protein-calorie malnutrition, severe    Present on Admission Present on Admission:  Acute hypoxic respiratory failure (HCC) - Enterbacterales Septicemia - STrepptoccosus Pneumnia   Active Problems Principal Problem:   Acute hypoxic respiratory failure (HCC) Active Problems:   Goals of care, counseling/discussion   Fungemia   Enterobacterales species bacteremia   Ventricular fibrillation (HCC)   Respiratory arrest (HCC)   Protein-calorie malnutrition, severe   Resolved Problems Active Hospital Problems   Diagnosis Date Noted   Acute hypoxic respiratory failure (HCC) 05/18/2024   Fungemia 05/20/2024   Enterobacterales species bacteremia 05/20/2024   Ventricular fibrillation (HCC) 05/20/2024   Respiratory arrest (HCC) 05/20/2024   Protein-calorie malnutrition, severe 05/20/2024   Goals of care, counseling/discussion 05/19/2024    Resolved Hospital Problems  No resolved problems to display.     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   Allergic  rhinitis 11/24/2022   Acquired trigger finger of left ring finger 10/18/2021   Acquired trigger finger of right ring finger 10/18/2021   Iron deficiency anemia 01/12/2021   Unspecified atrial fibrillation (HCC) 08/19/2020   Anemia    Coronary artery disease    HLD (hyperlipidemia)    HTN (hypertension)    Pulmonary nodule    S/P CABG (coronary artery bypass graft)    Severe aortic stenosis    Adenocarcinoma, lung, 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 patient with    has a past medical history of Acquired trigger finger of left ring finger (10/18/2021), Acquired trigger finger of right ring finger (10/18/2021), Acute on chronic diastolic heart failure (HCC) (89/70/7980), Adenocarcinoma, lung, right (HCC) (10/27/2019), Allergic rhinitis (11/24/2022), Anemia, CAD (coronary artery disease) (03/25/2018), COPD (chronic obstructive pulmonary disease) (HCC) (06/14/2018), Coronary artery disease, Dyspnea, Essential hypertension (03/25/2018), History of radiation therapy (06/10/2019), History of radiation therapy, History of radiation therapy, HLD (hyperlipidemia), HTN (hypertension), CABG (03/25/2018), Iron deficiency anemia (01/12/2021), Mixed dyslipidemia (03/25/2018), Non-small cell lung cancer, left (HCC) (12/27/2022), Pneumonia, Pulmonary nodule, S/P CABG (coronary artery bypass graft), S/P TAVR (transcatheter aortic valve replacement), Severe aortic stenosis, and Unspecified atrial fibrillation (HCC) (08/19/2020).   has a past surgical history that includes Cardiac surgery; RIGHT/LEFT HEART CATH AND  CORONARY/GRAFT ANGIOGRAPHY (N/A, 05/08/2018); Video bronchoscopy (Bilateral, 05/31/2018); Eye  surgery; Wisdom tooth extraction; Colonoscopy; Coronary artery bypass graft (2011); Cardiac catheterization; Bone marrow aspiration; Transcatheter aortic valve replacement, transapical (N/A, 07/02/2018); TEE without cardioversion (07/02/2018); Fiberoptic bronchoscopy (04/08/2019); Video bronchoscopy with endobronchial navigation (N/A, 04/08/2019); Video bronchoscopy with endobronchial ultrasound (N/A, 04/08/2019); Bronchial needle aspiration biopsy (02/13/2022); Bronchial brushings (02/13/2022); Bronchial biopsy (02/13/2022); Fiducial marker placement (02/13/2022); Bronchial washings (02/13/2022); Bronchial needle aspiration biopsy (11/27/2022); Bronchial biopsy (11/27/2022); Bronchial brushings (11/27/2022); Fiducial marker placement (11/27/2022); and Bronchial washings (11/27/2022).   Admitted on 05/18/2024 with  83 year old man who presented to [facility] ED 9/14 from home via EMS with complaints of acute SOB. PMHx significant for HTN, HLD, CAD (s/p CABG, on ASA), Afib (prescribed Eliquis , not taking), severe AS s/p TAVR, anemia, multiple lung cancers (treated with SBRT).  Had CABG x 2 performed in 2014 at [facility]   Echocardiogram showed severe aortic stenosis. On 06/2018 the patient had a TAVR procedure performed by Dr. Rozena   On ED arrival, patient was noted to have acute respiratory distress with hypoxia despite application of NRB, prompting emergent intubation. Vitals were notable for mild hypothermia, tachypnea, and mild hypotension. Lab work significant for Na 133, K 6.8, glucose 286, creatinine 1.31 (GFR 54), albumin 3.3, LA  1.9. WBC severely elevated at 65.3 (79% neutrophils) and immature granulocyte 5 3.    On arrival he has profound leukocytosis 65K.CT chest showed unchanged mass like architectural distortion in the right upper lung lobe extending to the right apex presumed to be changes of known non-small cell  lung cancer, partial nodule in right middle lobe, unchanged nodule lingula, new moderate pleural effusion with increased moderate left pleural effusion, anterior lobar septal thickening and gas attenuation    PCCM consulted for ICU admission and management.    eVENTS    9/14 Presented for acute shortness of breath admission workup concerning for volume overload and/or pneumonia 9/15- No significant events overnight. Remains in Afib. Intubated, weaning sedation. Attempted PSV, with tachypnea, low lung volumes when agitated 05/19/24 PM - cardiac arrest - V Fib/Torasae and 9 min later ROSC -> later limited Code /No CPR -> started levophd/amio gtt.  05/20/24 - Cards consult Echo ef 35% Impression: ardiac arrest, VT VF arrest was related to sepsis and demand ischemia. His drop in LVEF is again related to sepsis and do not think ACS.  05/20/24: Infectious diseases is as blood cultures positive for Candida albicans and Enterobacterales  ID recommended cefepime  and micafungin  and line change 2024/06/05: No significant events overnight. Less ectopy on tele today per RN. Seen by Cardiology, unfortunately high risk for further decompensation, feel patient would not benefit from additional interventions and that arrest was likely demand-driven 2/2 sepsis. Seen by ID, recs pending GOC. Family coming to visit today, transitioning to comfort care Later changed to comfort care  1512 patient absent of breath sounds and heart sounds with auscultation. Verified with peer Rosalva Sabine R - PASSED AWAY    Jun 06, 2024 blood culture from 9/.14 confirmed for Enterebacter    SIGNATURE    Dr. Dorethia Cave, M.D., F.C.C.P,  Pulmonary and Critical Care Medicine Staff Physician, [facility] Center Director - Interstitial Lung Disease  Program  [facility] at [facility] [place], [place], [address]   Pager: [phone], If no answer  -> Check AMION or Try [phone] Telephone (clinical office): [phone] Telephone (research): [phone]  8:14 AM 05/23/2024

## 2024-06-04 NOTE — Progress Notes (Signed)
 1512 patient absent of breath sounds and heart sounds with auscultation. Verified with peer Rosalva Sabine RN

## 2024-06-04 NOTE — Accreditation Note (Signed)
 Death within 24 hours of discontinuation of bilateral soft wrist restraints logged on 05/23/2024 at 1008 by Valentin Lai RN

## 2024-06-04 NOTE — Progress Notes (Signed)
 eLink Physician-Brief Progress Note Patient Name: Christopher Rocha DOB: 03/16/1941 MRN: [id number]   Date of Service  06-11-24  HPI/Events of Note  cardiac arrest w/ ROSC about 10 minutes  Vented  eICU Interventions  Restraints for patient safety        Christopher Rocha 06-11-2024, 3:37 AM

## 2024-06-04 NOTE — Progress Notes (Signed)
 Nutrition Brief Note  Chart reviewed. Pt now transitioning to comfort care.  No further nutrition interventions planned at this time.  Please re-consult as needed.   Drusilla Kanner, RDN, LDN Clinical Nutrition See AMiON for contact information.

## 2024-06-04 NOTE — IPAL (Addendum)
  Interdisciplinary Goals of Care Family Meeting   Date carried out: 05-25-24  Location of the meeting: Bedside  Member's involved: Patient's daughter, Christopher Rocha, patient's RN, Christopher Rocha, PCCM PA [name] or acting medical decision maker: Christopher Rocha (daughter)  Discussion: We discussed goals of care for Christopher Rocha .  I met with patient's daughter, Christopher Rocha, at bedside. We discussed Christopher Rocha's clinical status and medical history leading up to this point; she describes an independent, stubborn, loving man with a dry sense of humor who took care of everyone he loved. She recounts the medical difficulties he has had with his cancer diagnoses and his levity and strength despite these. After speaking with our team and with Dr. Ladona with Cardiology, she feels that we have maximized the amount of support that we can give Christopher Rocha. She is grateful for the care he has received here and wishes to care for him the same way he has cared for so many others; in this case, family has had an opportunity to visit with Christopher Rocha and they are ready at this time for compassionate extubation and for transition the focus of Christopher Rocha's care to his comfort.  Chaplain has been paged, Comfort Care orders placed. Will plan to compassionately extubate when family is ready. Anticipate in-hospital death.  Code status:   Code Status: Do not attempt resuscitation (DNR) - Comfort care   Disposition: In-patient comfort care  Time spent for the meeting: 15 minutes   Corean CHRISTELLA Chiana Wamser, PA-C  May 25, 2024, 12:39 PM

## 2024-06-04 NOTE — Progress Notes (Signed)
 Chaplain paged to pt's room as requested by family who has just consented to transition from curative care to comfort care. Chaplain introduced spiritual care and offered support to pt's family. Christopher Rocha was accompanied by his two daughters and several adult grandchildren and their spouses.  Chaplain asked open ended questions to facilitate story telling and emotional expression. Family engaged in life review sharing about [name] life growing up in [place] and his wife Donette who preceeded him in death 3 years ago. [name] family describes him and a jokester who had strong opinions, but also a deeply caring heart. The family requested prayer, which chaplain provided in their tradition.  Alan HERO. Davee Lomax, M.Div. [facility] Chaplain Pager [phone] Office [phone]

## 2024-06-04 DEATH — deceased

## 2024-06-10 LAB — FUNGUS CULTURE WITH STAIN

## 2024-06-10 LAB — FUNGUS CULTURE RESULT

## 2024-06-10 LAB — FUNGAL ORGANISM REFLEX

## 2024-06-19 LAB — YEAST SUSCEPTIBILITIES
Amphotericin B MIC: 0.5
Fluconazole Islt MIC: 0.5
ISAVUCONAZOLE MIC: 0.008
Itraconazole MIC: 0.12
Posaconazole MIC: 0.06

## 2024-06-26 MED FILL — Medication: Qty: 1 | Status: AC

## 2024-07-01 LAB — ACID FAST CULTURE WITH REFLEXED SENSITIVITIES (MYCOBACTERIA): Acid Fast Culture: NEGATIVE

## 2024-07-21 ENCOUNTER — Ambulatory Visit: Admitting: Oncology

## 2024-07-21 ENCOUNTER — Other Ambulatory Visit
